# Patient Record
Sex: Female | Born: 1970 | Race: Black or African American | Hispanic: No | Marital: Married | Smoking: Never smoker
Health system: Southern US, Community
[De-identification: ages and names within clinical notes are randomized; demographics above are authoritative.]

## PROBLEM LIST (undated history)

## (undated) DIAGNOSIS — R079 Chest pain, unspecified: Secondary | ICD-10-CM

## (undated) DIAGNOSIS — I5032 Chronic diastolic (congestive) heart failure: Secondary | ICD-10-CM

## (undated) DIAGNOSIS — M549 Dorsalgia, unspecified: Secondary | ICD-10-CM

## (undated) DIAGNOSIS — I3139 Other pericardial effusion (noninflammatory): Secondary | ICD-10-CM

## (undated) DIAGNOSIS — R51 Headache: Secondary | ICD-10-CM

## (undated) DIAGNOSIS — R519 Headache, unspecified: Secondary | ICD-10-CM

## (undated) DIAGNOSIS — I319 Disease of pericardium, unspecified: Secondary | ICD-10-CM

## (undated) DIAGNOSIS — E785 Hyperlipidemia, unspecified: Secondary | ICD-10-CM

## (undated) DIAGNOSIS — S83209A Unspecified tear of unspecified meniscus, current injury, unspecified knee, initial encounter: Secondary | ICD-10-CM

## (undated) DIAGNOSIS — I10 Essential (primary) hypertension: Secondary | ICD-10-CM

## (undated) DIAGNOSIS — M1990 Unspecified osteoarthritis, unspecified site: Secondary | ICD-10-CM

## (undated) DIAGNOSIS — F329 Major depressive disorder, single episode, unspecified: Secondary | ICD-10-CM

## (undated) DIAGNOSIS — I313 Pericardial effusion (noninflammatory): Secondary | ICD-10-CM

## (undated) DIAGNOSIS — K219 Gastro-esophageal reflux disease without esophagitis: Secondary | ICD-10-CM

## (undated) DIAGNOSIS — R600 Localized edema: Secondary | ICD-10-CM

## (undated) DIAGNOSIS — F32A Depression, unspecified: Secondary | ICD-10-CM

## (undated) HISTORY — DX: Unspecified osteoarthritis, unspecified site: M19.90

## (undated) HISTORY — DX: Unspecified tear of unspecified meniscus, current injury, unspecified knee, initial encounter: S83.209A

## (undated) HISTORY — DX: Chronic diastolic (congestive) heart failure: I50.32

## (undated) HISTORY — DX: Chest pain, unspecified: R07.9

## (undated) HISTORY — DX: Disease of pericardium, unspecified: I31.9

## (undated) HISTORY — DX: Dorsalgia, unspecified: M54.9

## (undated) HISTORY — PX: ABDOMINAL HYSTERECTOMY: SHX81

## (undated) HISTORY — PX: PARTIAL HYSTERECTOMY: SHX80

## (undated) HISTORY — DX: Localized edema: R60.0

## (undated) HISTORY — DX: Depression, unspecified: F32.A

## (undated) HISTORY — PX: APPENDECTOMY: SHX54

## (undated) HISTORY — PX: TUBAL LIGATION: SHX77

---
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
## 1990-03-24 HISTORY — PX: TUBAL LIGATION: SHX77

## 2014-06-16 DIAGNOSIS — M545 Low back pain, unspecified: Secondary | ICD-10-CM | POA: Insufficient documentation

## 2014-06-23 ENCOUNTER — Emergency Department (HOSPITAL_COMMUNITY)
Admission: EM | Admit: 2014-06-23 | Discharge: 2014-06-23 | Disposition: A | Discharge status: Home / Self Care | Payer: 59 | Attending: Emergency Medicine | Admitting: Emergency Medicine

## 2014-06-23 ENCOUNTER — Emergency Department (HOSPITAL_COMMUNITY): Payer: 59

## 2014-06-23 ENCOUNTER — Encounter (HOSPITAL_COMMUNITY): Payer: Self-pay | Admitting: Emergency Medicine

## 2014-06-23 DIAGNOSIS — Z79899 Other long term (current) drug therapy: Secondary | ICD-10-CM | POA: Diagnosis not present

## 2014-06-23 DIAGNOSIS — Z8639 Personal history of other endocrine, nutritional and metabolic disease: Secondary | ICD-10-CM | POA: Insufficient documentation

## 2014-06-23 DIAGNOSIS — M545 Low back pain: Secondary | ICD-10-CM | POA: Diagnosis present

## 2014-06-23 DIAGNOSIS — M5416 Radiculopathy, lumbar region: Secondary | ICD-10-CM | POA: Insufficient documentation

## 2014-06-23 DIAGNOSIS — M5417 Radiculopathy, lumbosacral region: Secondary | ICD-10-CM

## 2014-06-23 HISTORY — DX: Hyperlipidemia, unspecified: E78.5

## 2014-06-23 MED ORDER — HYDROCODONE-ACETAMINOPHEN 5-325 MG PO TABS: 1.0000 | ORAL_TABLET | Freq: Four times a day (QID) | ORAL | Status: DC | PRN

## 2014-06-23 MED ORDER — METHYLPREDNISOLONE 4 MG PO KIT: 16.0000 mg | PACK | ORAL | Status: DC

## 2014-06-23 MED ORDER — HYDROCODONE-ACETAMINOPHEN 5-325 MG PO TABS
1.0000 | ORAL_TABLET | Freq: Once | ORAL | Status: AC
  Administered 2014-06-23: 1 via ORAL
  Filled 2014-06-23: qty 1

## 2014-06-23 NOTE — ED Notes (Signed)
Pt verbalizes understanding of not being able to drive after taking pain med as ordered.  Pt states her boyfriend is coming to pick her up.

## 2014-06-23 NOTE — ED Notes (Signed)
Pt c/o lower back pain radiating into R leg x 1 month. Pt states she has been seen by a doctor and given pain medication but no relief with medication.

## 2014-06-23 NOTE — ED Provider Notes (Signed)
CSN: [id number]     Arrival date & time 06/23/14  2059 History  This chart was scribed for non-physician practitioner Earley Favor, PA-C working with Tilden Fossa, MD by Annye Asa, ED Scribe. This patient was seen in room WTR8/WTR8 and the patient's care was started at 9:15 PM.    Chief Complaint  Patient presents with  . Back Pain   Patient is a 44 y.o. female presenting with back pain. The history is provided by the patient. No language interpreter was used.  Back Pain    HPI Comments: Alicia Wood is a 44 y.o. female with past medical history of HLD who presents to the Emergency Department complaining of 1 month of lower back pain, radiating into her right leg. She denies recent trauma or injury; "my pain just started while I was at work." She has been "in and out of the hospital in [place]" and given pain medication (ibuprofen, Flexeril then Robaxin, Gabapentin). She reports that this medication has not provided relief; she is looking into scheduling an appointment with physical therapy.  PCP is Dr. Katrinka Blazing.   Past Medical History  Diagnosis Date  . Hyperlipidemia    Past Surgical History  Procedure Laterality Date  . Tubal ligation    . Appendectomy    . Partial hysterectomy     No family history on file. History  Substance Use Topics  . Smoking status: Never Smoker   . Smokeless tobacco: Not on file  . Alcohol Use: Yes   OB History    No data available     Review of Systems  Musculoskeletal: Positive for back pain.      Allergies  Review of patient's allergies indicates no known allergies.  Home Medications   Prior to Admission medications   Medication Sig Start Date End Date Taking? Authorizing Provider  gabapentin (NEURONTIN) 300 MG capsule Take 300 mg by mouth 3 (three) times daily.   Yes Historical Provider, MD  lovastatin (MEVACOR) 20 MG tablet Take 20 mg by mouth at bedtime.   Yes Historical Provider, MD  methocarbamol (ROBAXIN) 750 MG tablet  Take 750 mg by mouth 4 (four) times daily.   Yes Historical Provider, MD  phentermine 30 MG capsule Take 30 mg by mouth daily.   Yes Historical Provider, MD  HYDROcodone-acetaminophen (NORCO/VICODIN) 5-325 MG per tablet Take 1 tablet by mouth every 6 (six) hours as needed for moderate pain. 06/23/14   Earley Favor, NP  methylPREDNISolone (MEDROL DOSEPAK) 4 MG tablet Take 4 tablets (16 mg total) by mouth 1 day or 1 dose. Start with  decreasing to 4 mg over a period of 8 days 16 mg for 2 days, 12 mg for 2 days 8 mg for 2 days, 4 mg for 2 days, then stop 06/23/14   Earley Favor, NP   BP 114/74 mmHg  Pulse 100  Temp(Src) 99.2 F (37.3 C) (Oral)  Resp 19  Ht  (1.727 m)  Wt 269 lb (122.018 kg)  BMI 40.91 kg/m2  SpO2 98% Physical Exam  Constitutional: She is oriented to person, place, and time. She appears well-developed and well-nourished.  HENT:  Head: Normocephalic and atraumatic.  Neck: No tracheal deviation present.  Cardiovascular: Normal rate, regular rhythm and normal heart sounds.   No murmur heard. Pulmonary/Chest: Effort normal and breath sounds normal. No respiratory distress. She has no wheezes. She has no rales.  Neurological: She is alert and oriented to person, place, and time.  Skin: Skin is warm and dry.  Psychiatric: She has a normal mood and affect. Her behavior is normal.  Nursing note and vitals reviewed.   ED Course  Procedures   DIAGNOSTIC STUDIES: Oxygen Saturation is 98% on RA, normal by my interpretation.    COORDINATION OF CARE: 9:19 PM Discussed treatment plan with pt at bedside and pt agreed to plan.   Labs Review Labs Reviewed - No data to display  Imaging Review Dg Lumbar Spine Complete  06/23/2014   CLINICAL DATA:  Chronic lower back pain shooting into the right leg for 1 month. Initial encounter.  EXAM: LUMBAR SPINE - COMPLETE 4+ VIEW  COMPARISON:  CT of the abdomen and pelvis performed 06/19/2008  FINDINGS: There is no evidence of fracture or  subluxation. Vertebral bodies demonstrate normal height and alignment. Intervertebral disc spaces are preserved. The visualized neural foramina are grossly unremarkable in appearance.  The visualized bowel gas pattern is unremarkable in appearance; air and stool are noted within the colon. The sacroiliac joints are within normal limits.  IMPRESSION: No evidence of fracture or subluxation along the lumbar spine. If the patient's symptoms persist, MRI of the lumbar spine would be appropriate for further evaluation.   Electronically Signed   By: Roanna RaiderJeffery  Chang M.D.   On: 06/23/2014 22:08     EKG Interpretation None      MDM   Final diagnoses:  Radicular pain of lumbosacral region    I personally performed the services described in this documentation, which was scribed in my presence. The recorded information has been reviewed and is accurate.     Earley FavorGail Kavir Savoca, NP 06/23/14 2218  Tilden FossaElizabeth Rees, MD 06/23/14 2350

## 2014-06-23 NOTE — ED Notes (Signed)
Pt reports low back pain.  States she was given gabapentin and robaxin by her PCP without relief.  Pt reports pain would radiate down to her legs at times.

## 2014-06-23 NOTE — Discharge Instructions (Signed)
Your x-ray is normal.  You have been given medication for pain control as well, as I steroid to decrease her symptoms.  Please continue to pursue physical therapy and follow-up with your primary care physician

## 2015-04-24 ENCOUNTER — Encounter (HOSPITAL_COMMUNITY): Payer: Self-pay

## 2015-04-24 ENCOUNTER — Emergency Department (HOSPITAL_COMMUNITY)
Admission: EM | Admit: 2015-04-24 | Discharge: 2015-04-25 | Disposition: A | Discharge status: Home / Self Care | Payer: 59 | Attending: Emergency Medicine | Admitting: Emergency Medicine

## 2015-04-24 DIAGNOSIS — M79661 Pain in right lower leg: Secondary | ICD-10-CM | POA: Diagnosis not present

## 2015-04-24 DIAGNOSIS — Z79899 Other long term (current) drug therapy: Secondary | ICD-10-CM | POA: Diagnosis not present

## 2015-04-24 DIAGNOSIS — Z8639 Personal history of other endocrine, nutritional and metabolic disease: Secondary | ICD-10-CM | POA: Insufficient documentation

## 2015-04-24 NOTE — ED Notes (Addendum)
Pt complains of right lower leg pain since last Wednesday, she states that sometimes it gives out and it feels tight ans warm

## 2015-04-25 ENCOUNTER — Encounter (HOSPITAL_COMMUNITY): Payer: Self-pay

## 2015-04-25 ENCOUNTER — Emergency Department (HOSPITAL_COMMUNITY)
Admission: EM | Admit: 2015-04-25 | Discharge: 2015-04-25 | Disposition: A | Discharge status: Other Acute Inpatient Hospital | Payer: 59

## 2015-04-25 ENCOUNTER — Emergency Department (HOSPITAL_COMMUNITY)
Admission: EM | Admit: 2015-04-25 | Discharge: 2015-04-26 | Disposition: A | Discharge status: Home / Self Care | Payer: 59 | Source: Home / Self Care | Attending: Emergency Medicine | Admitting: Emergency Medicine

## 2015-04-25 ENCOUNTER — Ambulatory Visit (HOSPITAL_BASED_OUTPATIENT_CLINIC_OR_DEPARTMENT_OTHER)
Admission: RE | Admit: 2015-04-25 | Discharge: 2015-04-25 | Disposition: A | Discharge status: Home / Self Care | Payer: 59 | Source: Ambulatory Visit | Attending: Emergency Medicine | Admitting: Emergency Medicine

## 2015-04-25 DIAGNOSIS — Z79899 Other long term (current) drug therapy: Secondary | ICD-10-CM | POA: Diagnosis not present

## 2015-04-25 DIAGNOSIS — M79609 Pain in unspecified limb: Secondary | ICD-10-CM | POA: Diagnosis not present

## 2015-04-25 DIAGNOSIS — M79604 Pain in right leg: Secondary | ICD-10-CM

## 2015-04-25 DIAGNOSIS — Z8639 Personal history of other endocrine, nutritional and metabolic disease: Secondary | ICD-10-CM | POA: Diagnosis not present

## 2015-04-25 DIAGNOSIS — M79661 Pain in right lower leg: Secondary | ICD-10-CM | POA: Diagnosis not present

## 2015-04-25 MED ORDER — IBUPROFEN 800 MG PO TABS
800.0000 mg | ORAL_TABLET | Freq: Once | ORAL | Status: AC
  Administered 2015-04-25: 800 mg via ORAL
  Filled 2015-04-25: qty 1

## 2015-04-25 MED ORDER — IBUPROFEN 800 MG PO TABS: 800.0000 mg | ORAL_TABLET | Freq: Three times a day (TID) | ORAL | Status: DC

## 2015-04-25 MED ORDER — ENOXAPARIN SODIUM 120 MG/0.8ML ~~LOC~~ SOLN
1.0000 mg/kg | Freq: Once | SUBCUTANEOUS | Status: AC
  Administered 2015-04-25: 120 mg via SUBCUTANEOUS
  Filled 2015-04-25: qty 0.8

## 2015-04-25 MED ORDER — HYDROCODONE-ACETAMINOPHEN 5-325 MG PO TABS: 1.0000 | ORAL_TABLET | ORAL | Status: DC | PRN

## 2015-04-25 NOTE — ED Notes (Signed)
Pt transported to vascular lab. 

## 2015-04-25 NOTE — ED Provider Notes (Signed)
CSN: [id number]     Arrival date & time 04/24/15  2307 History   First MD Initiated Contact with Patient 04/25/15 0007     Chief Complaint  Patient presents with  . Leg Pain     (Consider location/radiation/quality/duration/timing/severity/associated sxs/prior Treatment) Patient is a 45 y.o. female presenting with leg pain. The history is provided by the patient. No language interpreter was used.  Leg Pain Location:  Leg Time since incident:  5 days Injury: no   Leg location:  R lower leg Pain details:    Severity:  Moderate Chronicity:  New Dislocation: no   Prior injury to area:  No Relieved by:  Rest Worsened by:  Activity and bearing weight Ineffective treatments:  None tried Associated symptoms: no fever   Associated symptoms comment:  Lower right leg pain for the past several days without known injury. She reports when active and ambulatory the pain becomes so intense "it'll give out". She reports falls. No back or thigh pain. No fever, redness, numbness. No history of blood clots.    Past Medical History  Diagnosis Date  . Hyperlipidemia    Past Surgical History  Procedure Laterality Date  . Tubal ligation    . Appendectomy    . Partial hysterectomy     History reviewed. No pertinent family history. Social History  Substance Use Topics  . Smoking status: Never Smoker   . Smokeless tobacco: None  . Alcohol Use: Yes   OB History    No data available     Review of Systems  Constitutional: Negative for fever and chills.  Musculoskeletal:       See HPI.  Skin: Negative.  Negative for color change.  Neurological: Negative.  Negative for numbness.      Allergies  Review of patient's allergies indicates no known allergies.  Home Medications   Prior to Admission medications   Medication Sig Start Date End Date Taking? Authorizing Provider  terbinafine (LAMISIL) 250 MG tablet Take 250 mg by mouth daily. 03/28/15  Yes Historical Provider, MD   BP 107/81  mmHg  Pulse 74  Temp(Src) 98.3 F (36.8 C) (Oral)  Resp 18  SpO2 100% Physical Exam  Constitutional: She is oriented to person, place, and time. She appears well-developed and well-nourished.  Neck: Normal range of motion.  Cardiovascular: Intact distal pulses.   Pulmonary/Chest: Effort normal.  Musculoskeletal:  Right lower leg tender to calf and posterolateral lower leg. No swelling. No palpable mass. No redness or warmth.   Neurological: She is alert and oriented to person, place, and time.  Skin: Skin is warm and dry.    ED Course  Procedures (including critical care time) Labs Review Labs Reviewed - No data to display  Imaging Review No results found. I have personally reviewed and evaluated these images and lab results as part of my medical decision-making.   EKG Interpretation None      MDM   Final diagnoses:  None    1. Right lower leg pain  Patient presents with calf pain, no evidence of infection or injury. Differential includes DVT vs muscular pain, favor muscular pain. Will give Lovenox and have the patient return in the morning for doppler of lower leg to r/o DVT. Will provide pain relief and encourage PCP follow up for further management in outpatient setting.     Elpidio Anis, PA-C 04/25/15 0028  Azalia Bilis, MD 04/25/15 [phone]

## 2015-04-25 NOTE — ED Notes (Signed)
Pt here for right leg pain. Pt states leg will just give out causing her to fall. Seen last night at [facility] and told she could possibly have a blood clot and told to come here. Pt ambulatory to room.

## 2015-04-25 NOTE — Progress Notes (Signed)
*  Preliminary Results* Right lower extremity venous duplex completed. Right lower extremity is negative for deep vein thrombosis. There is no evidence of right Baker's cyst.  04/25/2015 9:57 AM  Gertie Fey, RVT, RDCS, RDMS

## 2015-04-25 NOTE — ED Notes (Signed)
Pt was accidentally arrived.  Pt has a Vascular study at [facility] this morning and was misinformed that it was at [facility].

## 2015-04-25 NOTE — Discharge Instructions (Signed)
Heat Therapy  Heat therapy can help ease sore, stiff, injured, and tight muscles and joints. Heat relaxes your muscles, which may help ease your pain.   RISKS AND COMPLICATIONS  If you have any of the following conditions, do not use heat therapy unless your health care provider has approved:   Poor circulation.   Healing wounds or scarred skin in the area being treated.   Diabetes, heart disease, or high blood pressure.   Not being able to feel (numbness) the area being treated.   Unusual swelling of the area being treated.   Active infections.   Blood clots.   Cancer.   Inability to communicate pain. This may include young children and people who have problems with their brain function (dementia).   Pregnancy.  Heat therapy should only be used on old, pre-existing, or long-lasting (chronic) injuries. Do not use heat therapy on new injuries unless directed by your health care provider.  HOW TO USE HEAT THERAPY  There are several different kinds of heat therapy, including:   Moist heat pack.   Warm water bath.   Hot water bottle.   Electric heating pad.   Heated gel pack.   Heated wrap.   Electric heating pad.  Use the heat therapy method suggested by your health care provider. Follow your health care provider's instructions on when and how to use heat therapy.  GENERAL HEAT THERAPY RECOMMENDATIONS   Do not sleep while using heat therapy. Only use heat therapy while you are awake.   Your skin may turn pink while using heat therapy. Do not use heat therapy if your skin turns red.   Do not use heat therapy if you have new pain.   High heat or long exposure to heat can cause burns. Be careful when using heat therapy to avoid burning your skin.   Do not use heat therapy on areas of your skin that are already irritated, such as with a rash or sunburn.  SEEK MEDICAL CARE IF:   You have blisters, redness, swelling, or numbness.   You have new pain.   Your pain is worse.  MAKE SURE  YOU:   Understand these instructions.   Will watch your condition.   Will get help right away if you are not doing well or get worse.     This information is not intended to replace advice given to you by your health care provider. Make sure you discuss any questions you have with your health care provider.     Document Released: 06/02/2011 Document Revised: 03/31/2014 Document Reviewed: 05/03/2013  Elsevier Interactive Patient Education 2016 [employer]

## 2015-04-25 NOTE — ED Provider Notes (Signed)
Medical screening.  The patient was checked into the emergency department when she supposed to be at the vascular lab to have her Doppler studies of her right leg. Patient was seen last evening at the [facility] [facility]. Was set up for vascular lab studies this morning. She accidentally went back to [facility] told to come here patient is not supposed to be in the emergency department at this time.  Patient's vital signs without any acute abnormalities. The patient in no distress. No tachycardia no hypoxia. Lungs are clear bilaterally heart regular rate and rhythm. Abdomen soft nontender. Right leg without significant swelling. Patient stable to go to vascular lab.  Vanetta Mulders, MD 04/25/15 [phone]

## 2015-07-20 ENCOUNTER — Ambulatory Visit: Payer: 59 | Admitting: Dietician

## 2015-08-20 ENCOUNTER — Emergency Department (HOSPITAL_COMMUNITY): Payer: 59

## 2015-08-20 ENCOUNTER — Encounter (HOSPITAL_COMMUNITY): Payer: Self-pay | Admitting: *Deleted

## 2015-08-20 ENCOUNTER — Emergency Department (HOSPITAL_COMMUNITY)
Admission: EM | Admit: 2015-08-20 | Discharge: 2015-08-21 | Disposition: A | Discharge status: Home / Self Care | Payer: 59 | Attending: Emergency Medicine | Admitting: Emergency Medicine

## 2015-08-20 DIAGNOSIS — E785 Hyperlipidemia, unspecified: Secondary | ICD-10-CM | POA: Insufficient documentation

## 2015-08-20 DIAGNOSIS — R079 Chest pain, unspecified: Secondary | ICD-10-CM | POA: Insufficient documentation

## 2015-08-20 DIAGNOSIS — R0602 Shortness of breath: Secondary | ICD-10-CM | POA: Diagnosis not present

## 2015-08-20 DIAGNOSIS — R0789 Other chest pain: Secondary | ICD-10-CM | POA: Diagnosis not present

## 2015-08-20 LAB — CBC
HCT: 33.9 % — ABNORMAL LOW (ref 36.0–46.0)
Hemoglobin: 11.6 g/dL — ABNORMAL LOW (ref 12.0–15.0)
MCH: 29.1 pg (ref 26.0–34.0)
MCHC: 34.2 g/dL (ref 30.0–36.0)
MCV: 85.2 fL (ref 78.0–100.0)
Platelets: 341 10*3/uL (ref 150–400)
RBC: 3.98 MIL/uL (ref 3.87–5.11)
RDW: 13.7 % (ref 11.5–15.5)
WBC: 6.4 10*3/uL (ref 4.0–10.5)

## 2015-08-20 LAB — BASIC METABOLIC PANEL
Anion gap: 8 (ref 5–15)
BUN: 14 mg/dL (ref 6–20)
CO2: 26 mmol/L (ref 22–32)
Calcium: 8.8 mg/dL — ABNORMAL LOW (ref 8.9–10.3)
Chloride: 101 mmol/L (ref 101–111)
Creatinine, Ser: 1.11 mg/dL — ABNORMAL HIGH (ref 0.44–1.00)
GFR calc Af Amer: >60 mL/min (ref >60)
GFR calc non Af Amer: 58 mL/min — ABNORMAL LOW (ref >60)
Glucose, Bld: 106 mg/dL — ABNORMAL HIGH (ref 65–99)
Potassium: 3.7 mmol/L (ref 3.5–5.1)
Sodium: 135 mmol/L (ref 135–145)

## 2015-08-20 LAB — URINALYSIS, ROUTINE W REFLEX MICROSCOPIC
Glucose, UA: NEGATIVE mg/dL
Hgb urine dipstick: NEGATIVE
Ketones, ur: NEGATIVE mg/dL
Leukocytes, UA: NEGATIVE
Nitrite: NEGATIVE
Protein, ur: NEGATIVE mg/dL
Specific Gravity, Urine: 1.031 — ABNORMAL HIGH (ref 1.005–1.030)
pH: 5.5 (ref 5.0–8.0)

## 2015-08-20 LAB — I-STAT TROPONIN, ED: Troponin i, poc: 0 ng/mL (ref 0.00–0.08)

## 2015-08-20 MED ORDER — KETOROLAC TROMETHAMINE 30 MG/ML IJ SOLN
30.0000 mg | Freq: Once | INTRAMUSCULAR | Status: AC
  Administered 2015-08-20: 30 mg via INTRAVENOUS
  Filled 2015-08-20: qty 1

## 2015-08-20 NOTE — ED Provider Notes (Signed)
CSN: [id number]     Arrival date & time 08/20/15  2032 History   First MD Initiated Contact with Patient 08/20/15 2054     Chief Complaint  Patient presents with  . Chest Pain     (Consider location/radiation/quality/duration/timing/severity/associated sxs/prior Treatment) HPI Comments: 45yo F w/ HLD who p/w chest pain. Pt states that for the past 1 week, she has had constant, severe, left-sided chest pain that is worse laying flat and is sometimes associated with right shoulder pain and shortness of breath, particularly when laying flat. The pain is not associated with exertion. She denies any recent illness, fevers, cough/cold symptoms, vomiting, diarrhea, or abd pain. She does note that she urinates a lot, no dysuria. No recent travel, leg swelling, OCP use, hx cancer, or h/o blood clots. No FH heart disease or blood clots.   Patient is a 45 y.o. female presenting with chest pain. The history is provided by the patient.  Chest Pain   Past Medical History  Diagnosis Date  . Hyperlipidemia    Past Surgical History  Procedure Laterality Date  . Tubal ligation    . Appendectomy    . Partial hysterectomy     No family history on file. Social History  Substance Use Topics  . Smoking status: Never Smoker   . Smokeless tobacco: None  . Alcohol Use: Yes   OB History    No data available     Review of Systems  Cardiovascular: Positive for chest pain.   10 Systems reviewed and are negative for acute change except as noted in the HPI.    Allergies  Review of patient's allergies indicates no known allergies.  Home Medications   Prior to Admission medications   Medication Sig Start Date End Date Taking? Authorizing Provider  ibuprofen (ADVIL,MOTRIN) 800 MG tablet Take 1 tablet (800 mg total) by mouth 3 (three) times daily. 04/25/15  Yes Elpidio AnisShari Upstill, PA-C  HYDROcodone-acetaminophen (NORCO/VICODIN) 5-325 MG tablet Take 1 tablet by mouth every 4 (four) hours as needed. 04/25/15    Shari Upstill, PA-C   BP 124/91 mmHg  Pulse 72  Temp(Src) 98.7 F (37.1 C) (Oral)  Resp 11  Ht 5\' 8"  (1.727 m)  Wt 264 lb (119.75 kg)  BMI 40.15 kg/m2  SpO2 100% Physical Exam  Constitutional: She is oriented to person, place, and time. She appears well-developed and well-nourished. No distress.  Sitting forward  HENT:  Head: Normocephalic and atraumatic.  Moist mucous membranes  Eyes: Conjunctivae are normal. Pupils are equal, round, and reactive to light.  Neck: Neck supple.  Cardiovascular: Normal rate, regular rhythm and normal heart sounds.   No murmur heard. Pulmonary/Chest: Effort normal and breath sounds normal.  Abdominal: Soft. Bowel sounds are normal. She exhibits no distension. There is no tenderness.  Musculoskeletal: She exhibits no edema.  Neurological: She is alert and oriented to person, place, and time.  Fluent speech  Skin: Skin is warm and dry.  Psychiatric: She has a normal mood and affect. Judgment normal.  Nursing note and vitals reviewed.   ED Course  Procedures (including critical care time) Labs Review Labs Reviewed  BASIC METABOLIC PANEL - Abnormal; Notable for the following:    Glucose, Bld 106 (*)    Creatinine, Ser 1.11 (*)    Calcium 8.8 (*)    GFR calc non Af Amer 58 (*)    All other components within normal limits  CBC - Abnormal; Notable for the following:    Hemoglobin 11.6 (*)  HCT 33.9 (*)    All other components within normal limits  URINALYSIS, ROUTINE W REFLEX MICROSCOPIC (NOT AT [facility]) - Abnormal; Notable for the following:    Color, Urine AMBER (*)    APPearance CLOUDY (*)    Specific Gravity, Urine 1.031 (*)    Bilirubin Urine SMALL (*)    All other components within normal limits  I-STAT TROPOININ, ED    Imaging Review Dg Chest 2 View  08/20/2015  CLINICAL DATA:  Central chest pain for 1 week, worsened today. Shortness of breath and right shoulder pain. EXAM: CHEST  2 VIEW COMPARISON:  None. FINDINGS: The  cardiomediastinal contours are normal. The lungs are clear. Pulmonary vasculature is normal. No consolidation, pleural effusion, or pneumothorax. No acute osseous abnormalities are seen. IMPRESSION: No acute pulmonary process. Electronically Signed   By: Rubye Oaks M.D.   On: 08/20/2015 21:03   I have personally reviewed and evaluated these lab results as part of my medical decision-making.   EKG Interpretation   Date/Time:  Monday Aug 20 2015 20:33:55 EDT Ventricular Rate:  91 PR Interval:  144 QRS Duration: 71 QT Interval:  354 QTC Calculation: 435 R Axis:   -4 Text Interpretation:  Sinus rhythm Borderline T abnormalities, anterior  leads Confirmed by Lincoln Brigham [phone]) on 08/20/2015 8:41:02 PM     Medications  ketorolac (TORADOL) 30 MG/ML injection 30 mg (30 mg Intravenous Given 08/20/15 2331)    MDM   Final diagnoses:  Chest pain, unspecified chest pain type   Pt w/ 1 week of constant CP worse laying flat and associated w/ shortness of breath especially when laying flat. She was well-appearing on exam with normal vital signs. O2 sat 98-100% on room air and normal work of breathing. EKG without obvious ischemic changes, no previous available for comparison. Chest x-ray unremarkable. Labwork reassuring.The patient is PERC negative therefore PE very unlikely. Given the constant nature of her CP for 1 week with normal troponin, I doubt ACS. Differential includes pericarditis given positional and constant nature of pain. HEART score <3 therefore I feel she is safe for discharge home. I have discussed supportive care including scheduled NSAIDs up to 5 days and f/u with PCP if sx do not improve. Return precautions reviewed and patient voiced understanding. Patient discharged in satisfactory condition.   Laurence Spates, MD 08/21/15 [phone]

## 2015-08-20 NOTE — ED Notes (Signed)
Pt states that she has had chest pain for about 1 week, worse over the past 3 days, associated with pain in the right shoulder area and sob. Pain not improved with OTC meds.

## 2015-08-20 NOTE — Discharge Instructions (Signed)
PLEASE TAKE A NON-STEROIDAL (ALEVE OR IBUPROFEN) ON A SCHEDULE FOR 3-4 DAYS STRAIGHT, NO LONGER THAN 5 DAYS CONSECUTIVELY. YOU MAY TAKE TYLENOL AS NEEDED IN BETWEEN DOSES.

## 2015-08-20 NOTE — ED Notes (Signed)
Patient transported to X-ray 

## 2015-11-11 ENCOUNTER — Encounter (HOSPITAL_COMMUNITY): Payer: Self-pay | Admitting: *Deleted

## 2015-11-11 ENCOUNTER — Ambulatory Visit (HOSPITAL_COMMUNITY)
Admission: EM | Admit: 2015-11-11 | Discharge: 2015-11-11 | Disposition: A | Discharge status: Home / Self Care | Payer: 59 | Attending: Family Medicine | Admitting: Family Medicine

## 2015-11-11 ENCOUNTER — Ambulatory Visit (INDEPENDENT_AMBULATORY_CARE_PROVIDER_SITE_OTHER): Payer: 59

## 2015-11-11 DIAGNOSIS — R0789 Other chest pain: Secondary | ICD-10-CM | POA: Diagnosis not present

## 2015-11-11 DIAGNOSIS — R079 Chest pain, unspecified: Secondary | ICD-10-CM | POA: Diagnosis not present

## 2015-11-11 LAB — POCT URINALYSIS DIP (DEVICE)
Bilirubin Urine: NEGATIVE
Glucose, UA: NEGATIVE mg/dL
Hgb urine dipstick: NEGATIVE
Ketones, ur: NEGATIVE mg/dL
Leukocytes, UA: NEGATIVE
Nitrite: NEGATIVE
Protein, ur: NEGATIVE mg/dL
Specific Gravity, Urine: 1.025 (ref 1.005–1.030)
Urobilinogen, UA: 2 mg/dL — ABNORMAL HIGH (ref 0.0–1.0)
pH: 6 (ref 5.0–8.0)

## 2015-11-11 LAB — POCT PREGNANCY, URINE: Preg Test, Ur: NEGATIVE

## 2015-11-11 MED ORDER — PREDNISONE 20 MG PO TABS: ORAL_TABLET | ORAL | 0 refills | Status: DC

## 2015-11-11 MED ORDER — HYDROCODONE-ACETAMINOPHEN 5-325 MG PO TABS: 1.0000 | ORAL_TABLET | Freq: Four times a day (QID) | ORAL | 0 refills | Status: DC | PRN

## 2015-11-11 NOTE — ED Triage Notes (Signed)
Pt c/o constant mid-upper chest pain x 2 wks with painful breathing, talking, bending.  C/O SOB - SOB worse when laying down trying to sleep.  Occasionally pain radiates over right shoulder.  C/O productive cough for past 3 days.  Pain feels like it did in May when she went to ED.

## 2015-11-11 NOTE — ED Provider Notes (Signed)
[facility]    CSN: [id number] Arrival date & time: 11/11/15  1622  First Provider Contact:  First MD Initiated Contact with Patient 11/11/15 1819        History   Chief Complaint Chief Complaint  Patient presents with  . Chest Pain    HPI Alicia Wood is a 45 y.o. female.   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 Her job entails lifting and being physically active.  She presents with 3 weeks of continuous right upper chest pain in the anterior area. This is aggravated by bending over, lifting, or coughing. She says that in general she does not have a cough.  Patient has a long history of menopausal symptoms with night sweats. There is no new fever or night sweats in with this illness. She has not had shortness of breath or nausea or abdominal pain  Patient is trying Aleve and ibuprofen without success. She's also tried Biofreeze.      Past Medical History:  Diagnosis Date  . Hyperlipidemia     There are no active problems to display for this patient.   Past Surgical History:  Procedure Laterality Date  . ABDOMINAL HYSTERECTOMY    . APPENDECTOMY    . PARTIAL HYSTERECTOMY    . TUBAL LIGATION      OB History    No data available       Home Medications    Prior to Admission medications   Medication Sig Start Date End Date Taking? Authorizing Provider  HYDROcodone-acetaminophen (NORCO) 5-325 MG tablet Take 1 tablet by mouth every 6 (six) hours as needed for moderate pain. 11/11/15   Elvina SidleKurt Tanza Pellot, MD  predniSONE (DELTASONE) 20 MG tablet Two daily with food 11/11/15   Elvina SidleKurt Aaryanna Hyden, MD    Family History No family history on file.  Social History Social History  Substance Use Topics  . Smoking status: Never Smoker  . Smokeless tobacco: Not on file  . Alcohol use Yes     Comment: occasional     Allergies   Review of patient's allergies indicates no known allergies.   Review of  Systems Review of Systems  Constitutional: Negative.   HENT: Negative.   Eyes: Negative.   Respiratory: Positive for chest tightness. Negative for cough, choking, shortness of breath, wheezing and stridor.   Cardiovascular: Positive for chest pain. Negative for palpitations and leg swelling.  Gastrointestinal: Negative.   Endocrine: Negative.   Genitourinary: Negative.   Musculoskeletal: Negative.   Neurological: Negative.      Physical Exam Triage Vital Signs ED Triage Vitals  Enc Vitals Group     BP 11/11/15 1658 121/87     Pulse Rate 11/11/15 1658 92     Resp 11/11/15 1658 20     Temp 11/11/15 1658 97.9 F (36.6 C)     Temp Source 11/11/15 1658 Oral     SpO2 11/11/15 1658 97 %     Weight --      Height --      Head Circumference --      Peak Flow --      Pain Score 11/11/15 1659 10     Pain Loc --      Pain Edu? --      Excl. in GC? --    No data found.   Updated Vital Signs BP 121/87   Pulse 92   Temp 97.9 F (36.6 C) (Oral)   Resp  20   SpO2 97%   Visual Acuity Right Eye Distance:   Left Eye Distance:   Bilateral Distance:    Right Eye Near:   Left Eye Near:    Bilateral Near:     Physical Exam  Constitutional: She is oriented to person, place, and time. She appears well-developed and well-nourished.  HENT:  Head: Normocephalic.  Mouth/Throat: Oropharynx is clear and moist.  Eyes: Conjunctivae are normal. Pupils are equal, round, and reactive to light.  Neck: Normal range of motion. Neck supple.  Cardiovascular: Normal rate, regular rhythm and normal heart sounds.  Exam reveals no gallop and no friction rub.   No murmur heard. Pulmonary/Chest: Effort normal and breath sounds normal.  Musculoskeletal: Normal range of motion.  Neurological: She is alert and oriented to person, place, and time.  Skin: Skin is warm and dry.  Nursing note and vitals reviewed.    UC Treatments / Results  Labs (all labs ordered are listed, but only abnormal  results are displayed) Labs Reviewed  POCT URINALYSIS DIP (DEVICE) - Abnormal; Notable for the following:       Result Value   Urobilinogen, UA 2.0 (*)    All other components within normal limits  POCT PREGNANCY, URINE    EKG  EKG Interpretation None       Radiology Dg Chest 2 View  Result Date: 11/11/2015 CLINICAL DATA:  Chest pain EXAM: CHEST  2 VIEW COMPARISON:  08/20/2015 FINDINGS: Mild cardiomegaly that is similar to previous, especially in the lateral projection. Stable aortic and hilar contours. There is no edema, consolidation, effusion, or pneumothorax. IMPRESSION: 1. No acute finding. 2. Mild cardiomegaly. Electronically Signed   By: Marnee SpringJonathon  Watts M.D.   On: 11/11/2015 18:36    Cardiomegaly, otherwise negative Procedures Procedures (including critical care time)  Medications Ordered in UC Medications - No data to display   Initial Impression / Assessment and Plan / UC Course  I have reviewed the triage vital signs and the nursing notes.  Pertinent labs & imaging results that were available during my care of the patient were reviewed by me and considered in my medical decision making (see chart for details).  Clinical Course      Final Clinical Impressions(s) / UC Diagnoses   Final diagnoses:  Chest wall pain    New Prescriptions New Prescriptions   HYDROCODONE-ACETAMINOPHEN (NORCO) 5-325 MG TABLET    Take 1 tablet by mouth every 6 (six) hours as needed for moderate pain.   PREDNISONE (DELTASONE) 20 MG TABLET    Two daily with food     Elvina SidleKurt Gaila Engebretsen, MD 11/11/15 [phone]

## 2015-11-22 ENCOUNTER — Other Ambulatory Visit: Payer: Self-pay | Admitting: Family Medicine

## 2015-11-22 ENCOUNTER — Encounter (HOSPITAL_COMMUNITY): Payer: Self-pay | Admitting: Emergency Medicine

## 2015-11-22 ENCOUNTER — Ambulatory Visit
Admission: RE | Admit: 2015-11-22 | Discharge: 2015-11-22 | Disposition: A | Discharge status: Home / Self Care | Payer: 59 | Source: Ambulatory Visit | Attending: Family Medicine | Admitting: Family Medicine

## 2015-11-22 ENCOUNTER — Inpatient Hospital Stay (HOSPITAL_COMMUNITY)
Admission: EM | Admit: 2015-11-22 | Discharge: 2015-11-29 | DRG: 316 | Disposition: A | Discharge status: Home / Self Care | Payer: 59 | Attending: Cardiovascular Disease | Admitting: Cardiovascular Disease

## 2015-11-22 DIAGNOSIS — R0781 Pleurodynia: Secondary | ICD-10-CM | POA: Diagnosis not present

## 2015-11-22 DIAGNOSIS — I3139 Other pericardial effusion (noninflammatory): Secondary | ICD-10-CM | POA: Diagnosis present

## 2015-11-22 DIAGNOSIS — D649 Anemia, unspecified: Secondary | ICD-10-CM | POA: Diagnosis present

## 2015-11-22 DIAGNOSIS — R000 Tachycardia, unspecified: Secondary | ICD-10-CM | POA: Diagnosis present

## 2015-11-22 DIAGNOSIS — I319 Disease of pericardium, unspecified: Secondary | ICD-10-CM | POA: Diagnosis not present

## 2015-11-22 DIAGNOSIS — I313 Pericardial effusion (noninflammatory): Secondary | ICD-10-CM | POA: Diagnosis not present

## 2015-11-22 DIAGNOSIS — E785 Hyperlipidemia, unspecified: Secondary | ICD-10-CM | POA: Diagnosis present

## 2015-11-22 DIAGNOSIS — R0789 Other chest pain: Secondary | ICD-10-CM

## 2015-11-22 DIAGNOSIS — R0602 Shortness of breath: Secondary | ICD-10-CM | POA: Diagnosis not present

## 2015-11-22 LAB — COMPREHENSIVE METABOLIC PANEL
ALT: 12 U/L — ABNORMAL LOW (ref 14–54)
AST: 18 U/L (ref 15–41)
Albumin: 3.9 g/dL (ref 3.5–5.0)
Alkaline Phosphatase: 80 U/L (ref 38–126)
Anion gap: 10 (ref 5–15)
BUN: 10 mg/dL (ref 6–20)
CO2: 24 mmol/L (ref 22–32)
Calcium: 9.6 mg/dL (ref 8.9–10.3)
Chloride: 101 mmol/L (ref 101–111)
Creatinine, Ser: 0.82 mg/dL (ref 0.44–1.00)
GFR calc Af Amer: >60 mL/min (ref >60)
GFR calc non Af Amer: >60 mL/min (ref >60)
Glucose, Bld: 89 mg/dL (ref 65–99)
Potassium: 4.4 mmol/L (ref 3.5–5.1)
Sodium: 135 mmol/L (ref 135–145)
Total Bilirubin: 1 mg/dL (ref 0.3–1.2)
Total Protein: 7.4 g/dL (ref 6.5–8.1)

## 2015-11-22 LAB — CBC WITH DIFFERENTIAL/PLATELET
Basophils Absolute: 0 10*3/uL (ref 0.0–0.1)
Basophils Relative: 0 %
Eosinophils Absolute: 0 10*3/uL (ref 0.0–0.7)
Eosinophils Relative: 1 %
HCT: 36.8 % (ref 36.0–46.0)
Hemoglobin: 11.8 g/dL — ABNORMAL LOW (ref 12.0–15.0)
Lymphocytes Relative: 18 %
Lymphs Abs: 1.6 10*3/uL (ref 0.7–4.0)
MCH: 28.6 pg (ref 26.0–34.0)
MCHC: 32.1 g/dL (ref 30.0–36.0)
MCV: 89.3 fL (ref 78.0–100.0)
Monocytes Absolute: 0.5 10*3/uL (ref 0.1–1.0)
Monocytes Relative: 6 %
Neutro Abs: 6.3 10*3/uL (ref 1.7–7.7)
Neutrophils Relative %: 75 %
Platelets: 387 10*3/uL (ref 150–400)
RBC: 4.12 MIL/uL (ref 3.87–5.11)
RDW: 14.5 % (ref 11.5–15.5)
WBC: 8.4 10*3/uL (ref 4.0–10.5)

## 2015-11-22 LAB — I-STAT TROPONIN, ED: Troponin i, poc: 0 ng/mL (ref 0.00–0.08)

## 2015-11-22 MED ORDER — ONDANSETRON HCL 4 MG/2ML IJ SOLN
4.0000 mg | Freq: Four times a day (QID) | INTRAMUSCULAR | Status: DC | PRN
  Administered 2015-11-23 – 2015-11-24 (×2): 4 mg via INTRAVENOUS
  Filled 2015-11-22 (×2): qty 2

## 2015-11-22 MED ORDER — KETOROLAC TROMETHAMINE 30 MG/ML IJ SOLN
30.0000 mg | Freq: Once | INTRAMUSCULAR | Status: AC
  Administered 2015-11-22: 30 mg via INTRAVENOUS
  Filled 2015-11-22: qty 1

## 2015-11-22 MED ORDER — IOPAMIDOL (ISOVUE-300) INJECTION 61%
75.0000 mL | Freq: Once | INTRAVENOUS | Status: AC | PRN
  Administered 2015-11-22: 75 mL via INTRAVENOUS

## 2015-11-22 MED ORDER — ACETAMINOPHEN 325 MG PO TABS
650.0000 mg | ORAL_TABLET | ORAL | Status: DC | PRN
  Administered 2015-11-27 (×2): 650 mg via ORAL
  Filled 2015-11-22 (×2): qty 2

## 2015-11-22 MED ORDER — COLCHICINE 0.6 MG PO TABS
0.6000 mg | ORAL_TABLET | Freq: Every day | ORAL | Status: DC
  Administered 2015-11-23: 0.6 mg via ORAL
  Filled 2015-11-22: qty 1

## 2015-11-22 MED ORDER — COLCHICINE 0.6 MG PO TABS
1.2000 mg | ORAL_TABLET | Freq: Once | ORAL | Status: AC
  Administered 2015-11-22: 1.2 mg via ORAL
  Filled 2015-11-22: qty 2

## 2015-11-22 NOTE — ED Provider Notes (Signed)
[facility]   CSN: [id number] Arrival date & time: 11/22/15  1828     History   Chief Complaint Chief Complaint  Patient presents with  . Chest Pain    HPI Shawndra Clute is a 45 y.o. female.  Patient presents with concern of ongoing chest pain, dyspnea. He notes anterior chest pressure, dyspnea, both worse with exertion. Symptoms of been present for at least the past few weeks, though they have become worse over the past few days. Patient has seen multiple physicians during this illness, has previously been diagnosed with musculoskeletal pain. Minimal relief with OTC medication, anti-inflammatories, Tylenol. No ongoing fever, chills, syncope or nausea, vomiting. Patient denies history of substantial medical problems, does not smoke.    Chest Pain   Pertinent negatives include no vomiting.    Past Medical History:  Diagnosis Date  . Hyperlipidemia     Patient Active Problem List   Diagnosis Date Noted  . Pericardial effusion 11/22/2015    Past Surgical History:  Procedure Laterality Date  . ABDOMINAL HYSTERECTOMY    . APPENDECTOMY    . PARTIAL HYSTERECTOMY    . TUBAL LIGATION      OB History    No data available       Home Medications    Prior to Admission medications   Medication Sig Start Date End Date Taking? Authorizing Provider  HYDROcodone-acetaminophen (NORCO) 5-325 MG tablet Take 1 tablet by mouth every 6 (six) hours as needed for moderate pain. Patient not taking: Reported on 11/22/2015 11/11/15   Elvina Sidle, MD  predniSONE (DELTASONE) 20 MG tablet Two daily with food Patient not taking: Reported on 11/22/2015 11/11/15   Elvina Sidle, MD    Family History No family history on file.  Social History Social History  Substance Use Topics  . Smoking status: Never Smoker  . Smokeless tobacco: Never Used  . Alcohol use Yes     Comment: occasional     Allergies   Review of patient's allergies indicates no  known allergies.   Review of Systems Review of Systems  Constitutional:       Per HPI, otherwise negative  HENT:       Per HPI, otherwise negative  Respiratory:       Per HPI, otherwise negative  Cardiovascular: Positive for chest pain.       Per HPI, otherwise negative  Gastrointestinal: Negative for vomiting.  Endocrine:       Negative aside from HPI  Genitourinary:       Neg aside from HPI   Musculoskeletal:       Per HPI, otherwise negative  Skin: Negative.   Neurological: Negative for syncope.     Physical Exam Updated Vital Signs BP 106/73   Pulse 82   Temp 98.6 F (37 C) (Oral)   Resp 20   Ht 5\' 8"  (1.727 m)   Wt 266 lb (120.7 kg)   SpO2 100%   BMI 40.45 kg/m   Physical Exam  Constitutional: She is oriented to person, place, and time. She appears well-developed and well-nourished. No distress.  HENT:  Head: Normocephalic and atraumatic.  Eyes: Conjunctivae and EOM are normal.  Cardiovascular: Regular rhythm.  Tachycardia present.  Exam reveals no friction rub.   Pulmonary/Chest: Effort normal and breath sounds normal. No stridor. No respiratory distress.  Abdominal: She exhibits no distension.  Musculoskeletal: She exhibits no edema.  Neurological: She is alert and oriented to person, place, and time. No cranial nerve  deficit.  Skin: Skin is warm and dry.  Psychiatric: She has a normal mood and affect.  Nursing note and vitals reviewed.    ED Treatments / Results  Labs (all labs ordered are listed, but only abnormal results are displayed) Labs Reviewed  CBC WITH DIFFERENTIAL/PLATELET - Abnormal; Notable for the following:       Result Value   Hemoglobin 11.8 (*)    All other components within normal limits  COMPREHENSIVE METABOLIC PANEL - Abnormal; Notable for the following:    ALT 12 (*)    All other components within normal limits  MRSA PCR SCREENING  T4, FREE  TSH  PROTIME-INR  APTT  BASIC METABOLIC PANEL  CBC  I-STAT TROPOININ, ED     EKG  EKG Interpretation  Date/Time:  Thursday November 22 2015 18:38:28 EDT Ventricular Rate:  102 PR Interval:  132 QRS Duration: 68 QT Interval:  340 QTC Calculation: 443 R Axis:   13 Text Interpretation:  Sinus tachycardia Cannot rule out Anterior infarct , age undetermined Abnormal ECG Abnormal ekg Confirmed by Gerhard MunchLOCKWOOD, Sharnese Heath  MD [phone]([id number]) on 11/22/2015 7:47:06 PM       Radiology Ct Chest W Contrast  Result Date: 11/22/2015 CLINICAL DATA:  45 year old female with chest pain and shortness of breath. EXAM: CT CHEST WITH CONTRAST TECHNIQUE: Multidetector CT imaging of the chest was performed during intravenous contrast administration. CONTRAST:  75mL ISOVUE-300 IOPAMIDOL (ISOVUE-300) INJECTION 61% COMPARISON:  None. FINDINGS: Cardiovascular: A moderate to large pericardial effusion is noted. Heart is otherwise unremarkable. There is no evidence of thoracic aortic aneurysm. Mediastinum/Nodes: No enlarged lymph nodes or mediastinal mass. Lungs/Pleura: Mild bibasilar atelectasis noted. There is no evidence of airspace disease, consolidation, nodule, mass, pleural effusion or pneumothorax. Upper Abdomen: Unremarkable Musculoskeletal: No acute or suspicious abnormalities. IMPRESSION: Moderate to large pericardial effusion. These results will be called to the ordering clinician or representative by the Radiologist Assistant, and communication documented in the PACS or zVision Dashboard. Electronically Signed   By: Harmon PierJeffrey  Hu M.D.   On: 11/22/2015 17:45    Procedures Procedures (including critical care time)  Medications Ordered in ED Medications  acetaminophen (TYLENOL) tablet 650 mg (not administered)  ondansetron (ZOFRAN) injection 4 mg (not administered)  colchicine tablet 0.6 mg (not administered)  ketorolac (TORADOL) 30 MG/ML injection 30 mg (30 mg Intravenous Given 11/22/15 2115)  colchicine tablet 1.2 mg (1.2 mg Oral Given 11/22/15 2255)   After my initial evaluation I discussed  the patient's case with our cardiology team. Cardiology performed the ultrasound consistent with pericardial effusion.  On repeat exam the patient remains in similar condition.   Initial Impression / Assessment and Plan / ED Course  I have reviewed the triage vital signs and the nursing notes.  Pertinent labs & imaging results that were available during my care of the patient were reviewed by me and considered in my medical decision making (see chart for details).  Patient presents with ongoing chest pain, dyspnea, worsening with exertion. Here she is awake and alert, but continued to complain of chest discomfort. CT scan, bedside ultrasound both consistent with pericardial effusion, likely chronic given the absence of decompensation. Patient has no history of coronary disease, nor substantial illness, was admitted for further evaluation, management, likely pericardiocentesis tomorrow.    Gerhard Munchobert Nerissa Constantin, MD 11/22/15 2330

## 2015-11-22 NOTE — H&P (Signed)
History & Physical    Patient ID: Brady Plant MRN: [id number], DOB/AGE: [birth year]   Admit date: 11/22/2015   Primary Physician: Pcp Not In System Primary Cardiologist: None  Patient Profile    Alicia Wood is a 64 y o female with 2 m history fo CP and SOB, now with incidental finding of pericardial effusion on CT scan  Past Medical History    Past Medical History:  Diagnosis Date  . Hyperlipidemia     Past Surgical History:  Procedure Laterality Date  . ABDOMINAL HYSTERECTOMY    . APPENDECTOMY    . PARTIAL HYSTERECTOMY    . TUBAL LIGATION       Allergies  No Known Allergies  History of Present Illness    Alicia Wood has no sig  PMH and is no medications,. She works for Public affairs consultant at [employer]. She noted progressive SOB and CP. Initially very mild abut has been getting worse. She has seen several outpatient providers and also seen in [facility] ED. A cause has not been identified till she had a CT scan to evaluate her lungs which found a moderate to large pericardial effusion. She reports CP at rest and with activity. Her pain is somewhat worse when laying flat. Her SOB is present with minimal activity but also with supine position. This has been progressive as well. No presyncope or syncope. No recent infections, no exposure to chemicals or sick people in general though she works at [employer].    No smoking, no illicit drug use and only occasional ethanol use.   Home Medications    Prior to Admission medications   Medication Sig Start Date End Date Taking? Authorizing Provider  HYDROcodone-acetaminophen (NORCO) 5-325 MG tablet Take 1 tablet by mouth every 6 (six) hours as needed for moderate pain. Patient not taking: Reported on 11/22/2015 11/11/15   Elvina Sidle, MD  predniSONE (DELTASONE) 20 MG tablet Two daily with food Patient not taking: Reported on 11/22/2015 11/11/15   Elvina Sidle, MD    Family History    No family history on  file.  Social History    Social History   Social History  . Marital status: Married    Spouse name: N/A  . Number of children: N/A  . Years of education: N/A   Occupational History  . Not on file.   Social History Main Topics  . Smoking status: Never Smoker  . Smokeless tobacco: Never Used  . Alcohol use Yes     Comment: occasional  . Drug use: No  . Sexual activity: Not on file   Other Topics Concern  . Not on file   Social History Narrative  . No narrative on file     Review of Systems    General:  No chills, fever, night sweats or weight changes.  Cardiovascular:  No chest pain, dyspnea on exertion, edema, orthopnea, palpitations, paroxysmal nocturnal dyspnea. Dermatological: No rash, lesions/masses Respiratory: No cough, dyspnea Urologic: No hematuria, dysuria Abdominal:   No nausea, vomiting, diarrhea, bright red blood per rectum, melena, or hematemesis Neurologic:  No visual changes, wkns, changes in mental status. All other systems reviewed and are otherwise negative except as noted above.  Physical Exam    Blood pressure (!) 135/101, pulse 97, temperature 99.3 F (37.4 C), temperature source Oral, resp. rate 14, height 5\' 8"  (1.727 m), weight 120.7 kg (266 lb), SpO2 100 %.  General: Pleasant, NAD Psych: Normal affect. Neuro: Alert and oriented X 3. Moves  all extremities spontaneously. HEENT: Normal  Neck: Supple without bruits or JVD. Lungs:  Resp regular and unlabored, CTA. Heart: RRR no s3, s4, or murmurs. Abdomen: Soft, non-tender, non-distended, BS + x 4.  Extremities: No clubbing, cyanosis or edema. DP/PT/Radials 2+ and equal bilaterally.  Labs    Troponin [facility](Point of Care Test)  Recent Labs  11/22/15 1909  TROPIPOC 0.00   No results for input(s): CKTOTAL, CKMB, TROPONINI in the last 72 hours. Lab Results  Component Value Date   WBC 8.4 11/22/2015   HGB 11.8 (L) 11/22/2015   HCT 36.8 11/22/2015   MCV 89.3 11/22/2015   PLT 387  11/22/2015    Recent Labs Lab 11/22/15 1845  NA 135  K 4.4  CL 101  CO2 24  BUN 10  CREATININE 0.82  CALCIUM 9.6  PROT 7.4  BILITOT 1.0  ALKPHOS 80  ALT 12*  AST 18  GLUCOSE 89   No results found for: CHOL, HDL, LDLCALC, TRIG No results found for: [facility]   Radiology Studies    Dg Chest 2 View  Result Date: 11/11/2015 CLINICAL DATA:  Chest pain EXAM: CHEST  2 VIEW COMPARISON:  08/20/2015 FINDINGS: Mild cardiomegaly that is similar to previous, especially in the lateral projection. Stable aortic and hilar contours. There is no edema, consolidation, effusion, or pneumothorax. IMPRESSION: 1. No acute finding. 2. Mild cardiomegaly. Electronically Signed   By: Marnee SpringJonathon  Watts M.D.   On: 11/11/2015 18:36   Ct Chest W Contrast  Result Date: 11/22/2015 CLINICAL DATA:  45 year old female with chest pain and shortness of breath. EXAM: CT CHEST WITH CONTRAST TECHNIQUE: Multidetector CT imaging of the chest was performed during intravenous contrast administration. CONTRAST:  75mL ISOVUE-300 IOPAMIDOL (ISOVUE-300) INJECTION 61% COMPARISON:  None. FINDINGS: Cardiovascular: A moderate to large pericardial effusion is noted. Heart is otherwise unremarkable. There is no evidence of thoracic aortic aneurysm. Mediastinum/Nodes: No enlarged lymph nodes or mediastinal mass. Lungs/Pleura: Mild bibasilar atelectasis noted. There is no evidence of airspace disease, consolidation, nodule, mass, pleural effusion or pneumothorax. Upper Abdomen: Unremarkable Musculoskeletal: No acute or suspicious abnormalities. IMPRESSION: Moderate to large pericardial effusion. These results will be called to the ordering clinician or representative by the Radiologist Assistant, and communication documented in the PACS or zVision Dashboard. Electronically Signed   By: Harmon PierJeffrey  Hu M.D.   On: 11/22/2015 17:45    ECG & Cardiac Imaging    NSR, decreased voltage, PR depression in I-III. No ST changes suggestive of pericarditis or  ischemic changes.   Assessment & Plan    Alicia Wood is 45 y o. She has no sign PMH. She is found to have a pericardial effusion on CT scan which was done to workup SOB and CP. I performed a bedside TTE. It confirmed a moderate-large sized effusion. Mild RA/RV collapse. Could not perform MV inflow doppler with the machine available to me. Her pulsus paradoxus was 6mmHg. Her SBP was 110 and HR 90's. I do believe that her symptoms are explained by her pericardial effusion. I sont know the etiology of her effusion but suspect inflammatory/infectious rather than malignant. Her pain is likely from pericarditis.   Plan: - Colchizine loading dose now and daily dosing from then on - TTE in am - NPO for likely tap - TFT to rule out thryroid etiology  Signed, Macario GoldsMarat Jehan Ranganathan, MD 11/22/2015, 9:44 PM

## 2015-11-22 NOTE — ED Triage Notes (Signed)
Pt st's she has had left chest heaviness off and on x's 2 months with shortness of breath.  Pt st's she was seen by her MD today and had a chest x-ray.  St's she was told to come to the ED ref. Fluid around her heart

## 2015-11-23 ENCOUNTER — Inpatient Hospital Stay (HOSPITAL_COMMUNITY): Payer: 59

## 2015-11-23 DIAGNOSIS — I319 Disease of pericardium, unspecified: Secondary | ICD-10-CM

## 2015-11-23 LAB — BASIC METABOLIC PANEL
Anion gap: 11 (ref 5–15)
BUN: 16 mg/dL (ref 6–20)
CO2: 26 mmol/L (ref 22–32)
Calcium: 9.4 mg/dL (ref 8.9–10.3)
Chloride: 102 mmol/L (ref 101–111)
Creatinine, Ser: 1.1 mg/dL — ABNORMAL HIGH (ref 0.44–1.00)
GFR calc Af Amer: >60 mL/min (ref >60)
GFR calc non Af Amer: 59 mL/min — ABNORMAL LOW (ref >60)
Glucose, Bld: 132 mg/dL — ABNORMAL HIGH (ref 65–99)
Potassium: 4.5 mmol/L (ref 3.5–5.1)
Sodium: 139 mmol/L (ref 135–145)

## 2015-11-23 LAB — CBC
HCT: 32.2 % — ABNORMAL LOW (ref 36.0–46.0)
Hemoglobin: 10.2 g/dL — ABNORMAL LOW (ref 12.0–15.0)
MCH: 28.7 pg (ref 26.0–34.0)
MCHC: 31.7 g/dL (ref 30.0–36.0)
MCV: 90.4 fL (ref 78.0–100.0)
Platelets: 332 10*3/uL (ref 150–400)
RBC: 3.56 MIL/uL — ABNORMAL LOW (ref 3.87–5.11)
RDW: 14.6 % (ref 11.5–15.5)
WBC: 9.5 10*3/uL (ref 4.0–10.5)

## 2015-11-23 LAB — ECHOCARDIOGRAM COMPLETE
Height: 68 in
Weight: 4256 oz

## 2015-11-23 LAB — MRSA PCR SCREENING: MRSA by PCR: NEGATIVE

## 2015-11-23 LAB — TSH: TSH: 0.273 u[IU]/mL — ABNORMAL LOW (ref 0.350–4.500)

## 2015-11-23 LAB — PROTIME-INR
INR: 1.02
Prothrombin Time: 13.4 seconds (ref 11.4–15.2)

## 2015-11-23 LAB — APTT: aPTT: 35 seconds (ref 24–36)

## 2015-11-23 LAB — T4, FREE: Free T4: 0.96 ng/dL (ref 0.61–1.12)

## 2015-11-23 MED ORDER — MORPHINE SULFATE (PF) 2 MG/ML IV SOLN
1.0000 mg | Freq: Once | INTRAVENOUS | Status: AC
  Administered 2015-11-23: 1 mg via INTRAVENOUS
  Filled 2015-11-23: qty 1

## 2015-11-23 MED ORDER — MORPHINE SULFATE (PF) 2 MG/ML IV SOLN
1.0000 mg | INTRAVENOUS | Status: DC | PRN
  Administered 2015-11-23 – 2015-11-28 (×6): 2 mg via INTRAVENOUS
  Filled 2015-11-23 (×6): qty 1

## 2015-11-23 MED ORDER — IBUPROFEN 200 MG PO TABS
800.0000 mg | ORAL_TABLET | Freq: Three times a day (TID) | ORAL | Status: DC
  Administered 2015-11-23 (×3): 800 mg via ORAL
  Filled 2015-11-23 (×3): qty 4

## 2015-11-23 MED ORDER — COLCHICINE 0.6 MG PO TABS
0.6000 mg | ORAL_TABLET | Freq: Two times a day (BID) | ORAL | Status: DC
  Administered 2015-11-23 – 2015-11-29 (×12): 0.6 mg via ORAL
  Filled 2015-11-23 (×12): qty 1

## 2015-11-23 NOTE — Progress Notes (Signed)
*  PRELIMINARY RESULTS* Echocardiogram 2D Echocardiogram has been performed.  Jeryl Columbialliott, Jackelynn Hosie 11/23/2015, 10:17 AM

## 2015-11-23 NOTE — Progress Notes (Signed)
Patient Name: Alicia Wood Date of Encounter: 11/23/2015  Hospital Problem List     Active Problems:   Pericardial effusion    Patient Profile     Alicia Wood is 45 y o. She has no sign PMH. She presented with chest pain and progressive SOB. She is found to have a pericardial effusion on CT scan which was done to workup SOB and CP. I performed a bedside TTE.    Subjective   Chest pain this morning.  Moderately uncomfortable. No acute SOB  Inpatient Medications    . colchicine  0.6 mg Oral Daily    Vital Signs    Vitals:   11/23/15 0400 11/23/15 0600 11/23/15 0700 11/23/15 0800  BP: 103/71 (!) 84/58 95/65 104/71  Pulse: 89 81 84 85  Resp: (!) 25 (!) 21 (!) 23 18  Temp: 98.5 F (36.9 C)   98.4 F (36.9 C)  TempSrc: Oral   Oral  SpO2: 100% 99% 96% 97%  Weight:      Height:       No intake or output data in the 24 hours ending 11/23/15 0930 Filed Weights   11/22/15 1848  Weight: 266 lb (120.7 kg)    Physical Exam    GEN: Well nourished, well developed, in  no acute distress.  Neck: Supple, no JVD, carotid bruits, or masses. Cardiac: RRR, no rubs, or gallops. No clubbing, cyanosis, no edema.  Radials/DP/PT 2+ and equal bilaterally.  Respiratory:  Respirations  regular and unlabored, clear to auscultation bilaterally. GI: Soft, nontender, nondistended, BS + x 4. Neuro:  Strength and sensation are intact.   Labs    CBC  Recent Labs  11/22/15 1845 11/23/15 0622  WBC 8.4 9.5  NEUTROABS 6.3  --   HGB 11.8* 10.2*  HCT 36.8 32.2*  MCV 89.3 90.4  PLT 387 332   Basic Metabolic Panel  Recent Labs  11/22/15 1845 11/23/15 0622  NA 135 139  K 4.4 4.5  CL 101 102  CO2 24 26  GLUCOSE 89 132*  BUN 10 16  CREATININE 0.82 1.10*  CALCIUM 9.6 9.4   Liver Function Tests  Recent Labs  11/22/15 1845  AST 18  ALT 12*  ALKPHOS 80  BILITOT 1.0  PROT 7.4  ALBUMIN 3.9   No results for input(s): LIPASE, AMYLASE in the last 72 hours. Cardiac  Enzymes No results for input(s): CKTOTAL, CKMB, CKMBINDEX, TROPONINI in the last 72 hours. BNP Invalid input(s): POCBNP D-Dimer No results for input(s): DDIMER in the last 72 hours. Hemoglobin A1C No results for input(s): HGBA1C in the last 72 hours. Fasting Lipid Panel No results for input(s): CHOL, HDL, LDLCALC, TRIG, CHOLHDL, LDLDIRECT in the last 72 hours. Thyroid Function Tests  Recent Labs  11/22/15 2347  TSH 0.273*    Telemetry   Sinus tachycardiac  ECG   Repeat pending  Radiology    Dg Chest 2 View  Result Date: 11/11/2015 CLINICAL DATA:  Chest pain EXAM: CHEST  2 VIEW COMPARISON:  08/20/2015 FINDINGS: Mild cardiomegaly that is similar to previous, especially in the lateral projection. Stable aortic and hilar contours. There is no edema, consolidation, effusion, or pneumothorax. IMPRESSION: 1. No acute finding. 2. Mild cardiomegaly. Electronically Signed   By: Marnee SpringJonathon  Watts M.D.   On: 11/11/2015 18:36   Ct Chest W Contrast  Result Date: 11/22/2015 CLINICAL DATA:  45 year old female with chest pain and shortness of breath. EXAM: CT CHEST WITH CONTRAST TECHNIQUE: Multidetector CT imaging of the  chest was performed during intravenous contrast administration. CONTRAST:  75mL ISOVUE-300 IOPAMIDOL (ISOVUE-300) INJECTION 61% COMPARISON:  None. FINDINGS: Cardiovascular: A moderate to large pericardial effusion is noted. Heart is otherwise unremarkable. There is no evidence of thoracic aortic aneurysm. Mediastinum/Nodes: No enlarged lymph nodes or mediastinal mass. Lungs/Pleura: Mild bibasilar atelectasis noted. There is no evidence of airspace disease, consolidation, nodule, mass, pleural effusion or pneumothorax. Upper Abdomen: Unremarkable Musculoskeletal: No acute or suspicious abnormalities. IMPRESSION: Moderate to large pericardial effusion. These results will be called to the ordering clinician or representative by the Radiologist Assistant, and communication documented in  the PACS or zVision Dashboard. Electronically Signed   By: Harmon Pier M.D.   On: 11/22/2015 17:45    Assessment & Plan    PERICARDIAL EFFUSION:   Echo pending.  I will increase the colchicine and add high dose ibuprofen.  Review echo.  Pericardicentesis if there is suggestion of tamponade.  She does have a borderline BP but no pulsus paradox on exam.    ANEMIA:  No evidence of active bleeding.  Follow CBCs and check stool guaiac.    DECREASED TSH:  T4 is normal.  No change in medical therapy at this point.   Signed, Rollene Rotunda, MD  11/23/2015, 9:30 AM

## 2015-11-24 DIAGNOSIS — R0781 Pleurodynia: Secondary | ICD-10-CM

## 2015-11-24 LAB — TROPONIN I: Troponin I: <0.03 ng/mL (ref <0.03)

## 2015-11-24 LAB — SEDIMENTATION RATE: Sed Rate: 30 mm/hr — ABNORMAL HIGH (ref 0–22)

## 2015-11-24 LAB — C-REACTIVE PROTEIN: CRP: 11.3 mg/dL — ABNORMAL HIGH (ref <1.0)

## 2015-11-24 MED ORDER — NAPROXEN 250 MG PO TABS
500.0000 mg | ORAL_TABLET | Freq: Two times a day (BID) | ORAL | Status: DC
  Administered 2015-11-24 – 2015-11-26 (×5): 500 mg via ORAL
  Filled 2015-11-24 (×6): qty 2

## 2015-11-24 MED ORDER — SODIUM CHLORIDE 0.9% FLUSH: 10.0000 mL | INTRAVENOUS | Status: DC | PRN

## 2015-11-24 MED ORDER — PANTOPRAZOLE SODIUM 40 MG PO TBEC
40.0000 mg | DELAYED_RELEASE_TABLET | Freq: Every day | ORAL | Status: DC
  Administered 2015-11-24 – 2015-11-29 (×6): 40 mg via ORAL
  Filled 2015-11-24 (×6): qty 1

## 2015-11-24 MED ORDER — PANTOPRAZOLE SODIUM 40 MG PO TBEC: 40.0000 mg | DELAYED_RELEASE_TABLET | Freq: Every day | ORAL | Status: DC

## 2015-11-24 MED ORDER — FAMOTIDINE IN NACL 20-0.9 MG/50ML-% IV SOLN
20.0000 mg | Freq: Once | INTRAVENOUS | Status: AC
Start: 2015-11-24 — End: 2015-11-24
  Administered 2015-11-24: 20 mg via INTRAVENOUS
  Filled 2015-11-24: qty 50

## 2015-11-24 MED ORDER — PREDNISONE 20 MG PO TABS
40.0000 mg | ORAL_TABLET | Freq: Every day | ORAL | Status: AC
  Administered 2015-11-24 – 2015-11-25 (×2): 40 mg via ORAL
  Filled 2015-11-24 (×2): qty 2

## 2015-11-24 MED ORDER — SODIUM CHLORIDE 0.9% FLUSH
10.0000 mL | Freq: Two times a day (BID) | INTRAVENOUS | Status: DC
  Administered 2015-11-24 – 2015-11-26 (×5): 10 mL
  Administered 2015-11-26: 20 mL
  Administered 2015-11-27 (×2): 10 mL

## 2015-11-24 NOTE — Progress Notes (Signed)
Pt vomited, Zofran given, MD updated, nursing will cont to monitor

## 2015-11-24 NOTE — Progress Notes (Addendum)
CARDIOLOGY ROUNDING NOTE  Patient Name: Alicia Wood Date of Encounter: 11/24/2015   Patient Profile     Alicia Wood is 45 y o. She has no sign PMH. She presented with chest pain and progressive SOB. She is found to have a pericardial effusion on CT scan which was done to workup SOB and CP.   Subjective    This am crying with CP. WOrse with deep breathing, sitting up, lying back. Any movement.    Relieved with Morphine. Then complained of nausea and got zofran. Now has lost IV and IV team trying to get PIV  No recent rashes, arthalgias, fever, etc.  HR 84 SBP 111   Inpatient Medications    . colchicine  0.6 mg Oral BID  . famotidine (PEPCID) IV  20 mg Intravenous Once  . ibuprofen  800 mg Oral TID  . pantoprazole  40 mg Oral Q0600    Vital Signs    Vitals:   11/23/15 2200 11/24/15 0000 11/24/15 0400 11/24/15 0815  BP: 96/83 111/85 99/66 111/75  Pulse: 85 83 74 76  Resp: 19 18 17  (!) 22  Temp: 98 F (36.7 C)  98.1 F (36.7 C) 97.9 F (36.6 C)  TempSrc: Oral  Oral Oral  SpO2: 100% 100% 94% 100%  Weight:      Height:       No intake or output data in the 24 hours ending 11/24/15 0841 Filed Weights   11/22/15 1848  Weight: 120.7 kg (266 lb)    Physical Exam    GEN: Well nourished, well developed, in  no acute distress.  Neck: Supple, no JVD, carotid bruits, or masses. Cardiac: RRR, no rubs, or gallops. No clubbing, cyanosis, no edema.  Radials/DP/PT 2+ and equal bilaterally.  Respiratory:  Respirations  regular and unlabored, clear to auscultation bilaterally. GI: Soft, nontender, nondistended, BS + x 4. Neuro:  Strength and sensation are intact. No hot joints or rashes   Labs    CBC  Recent Labs  11/22/15 1845 11/23/15 0622  WBC 8.4 9.5  NEUTROABS 6.3  --   HGB 11.8* 10.2*  HCT 36.8 32.2*  MCV 89.3 90.4  PLT 387 332   Basic Metabolic Panel  Recent Labs  11/22/15 1845 11/23/15 0622  NA 135 139  K 4.4 4.5  CL 101 102  CO2 24 26    GLUCOSE 89 132*  BUN 10 16  CREATININE 0.82 1.10*  CALCIUM 9.6 9.4   Liver Function Tests  Recent Labs  11/22/15 1845  AST 18  ALT 12*  ALKPHOS 80  BILITOT 1.0  PROT 7.4  ALBUMIN 3.9   No results for input(s): LIPASE, AMYLASE in the last 72 hours. Cardiac Enzymes No results for input(s): CKTOTAL, CKMB, CKMBINDEX, TROPONINI in the last 72 hours. BNP Invalid input(s): POCBNP D-Dimer No results for input(s): DDIMER in the last 72 hours. Hemoglobin A1C No results for input(s): HGBA1C in the last 72 hours. Fasting Lipid Panel No results for input(s): CHOL, HDL, LDLCALC, TRIG, CHOLHDL, LDLDIRECT in the last 72 hours. Thyroid Function Tests  Recent Labs  11/22/15 2347  TSH 0.273*    Telemetry   NSR 80s  Radiology    Dg Chest 2 View  Result Date: 11/11/2015 CLINICAL DATA:  Chest pain EXAM: CHEST  2 VIEW COMPARISON:  08/20/2015 FINDINGS: Mild cardiomegaly that is similar to previous, especially in the lateral projection. Stable aortic and hilar contours. There is no edema, consolidation, effusion, or pneumothorax. IMPRESSION: 1. No  acute finding. 2. Mild cardiomegaly. Electronically Signed   By: Marnee SpringJonathon  Watts M.D.   On: 11/11/2015 18:36   Ct Chest W Contrast  Result Date: 11/22/2015 CLINICAL DATA:  45 year old female with chest pain and shortness of breath. EXAM: CT CHEST WITH CONTRAST TECHNIQUE: Multidetector CT imaging of the chest was performed during intravenous contrast administration. CONTRAST:  75mL ISOVUE-300 IOPAMIDOL (ISOVUE-300) INJECTION 61% COMPARISON:  None. FINDINGS: Cardiovascular: A moderate to large pericardial effusion is noted. Heart is otherwise unremarkable. There is no evidence of thoracic aortic aneurysm. Mediastinum/Nodes: No enlarged lymph nodes or mediastinal mass. Lungs/Pleura: Mild bibasilar atelectasis noted. There is no evidence of airspace disease, consolidation, nodule, mass, pleural effusion or pneumothorax. Upper Abdomen: Unremarkable  Musculoskeletal: No acute or suspicious abnormalities. IMPRESSION: Moderate to large pericardial effusion. These results will be called to the ordering clinician or representative by the Radiologist Assistant, and communication documented in the PACS or zVision Dashboard. Electronically Signed   By: Harmon PierJeffrey  Hu M.D.   On: 11/22/2015 17:45    Assessment     1. PERICARDIAL EFFUSION/pleuritic CP  2. ANEMIA:  No evidence of active bleeding.  3. DECREASED TSH:  T4 is normal.  No change in medical therapy at this point.    Plan/Discussion    I have reviewed echo images personally and there is a moderate to large circumfrential pericardial effusion. There is no tamponade. IVC is not dilated. No significant respiratory variation in mitral inflow. No evidence of PAH.   Denies recent viral illness, arthralgias, rash or fevers.   Will need work-up for rheumatologic disorders and TB: Check ANA, RF, HIV.  Continue colchicine. Given persistent CP will stop NSAIDS and start steroids and PPI. Repeat echo in several days to reassess need for tap.   May need PICC line if IV access remains an issue.     Arvilla MeresBensimhon, Garet Hooton, MD  11/24/2015, 8:41 AM

## 2015-11-24 NOTE — Progress Notes (Signed)
This note also relates to the following rows which could not be included: BP - Cannot attach notes to unvalidated device data MAP (mmHg) - Cannot attach notes to unvalidated device data Pulse Rate - Cannot attach notes to unvalidated device data ECG Heart Rate - Cannot attach notes to unvalidated device data Resp - Cannot attach notes to unvalidated device data SpO2 - Cannot attach notes to unvalidated device data    11/24/15 0735  Vitals  BP Location Left Arm  BP Method Automatic  Patient Position (if appropriate) Lying  Pulse Rate Source Monitor  Cardiac Rhythm NSR  Oxygen Therapy  O2 Device Nasal Cannula  O2 Flow Rate (L/min) 2 L/min  Pain Assessment  Pain Assessment 0-10  Pain Score 10  Pain Type Acute pain  Pain Location Chest  Pain Orientation Mid;Left  Pain Descriptors / Indicators Heaviness;Pressure  Pain Frequency Constant  Pain Intervention(s) Medication (See eMAR)  Glasgow Coma Scale  Eye Opening 4  Best Verbal Response (NON-intubated) 5  Best Motor Response 6  Glasgow Coma Scale Score 15  pt c/o CP, pt OBS to be crying, pt VSS, pt placed on 2L O2 and EKG done, pt answering questions aprop, per Night RN pt c/o this pain during the night however not in tears as seen this am, MD called awaiting on call back for update

## 2015-11-24 NOTE — Progress Notes (Signed)
Peripherally Inserted Central Catheter/Midline Placement  The IV Nurse has discussed with the patient and/or persons authorized to consent for the patient, the purpose of this procedure and the potential benefits and risks involved with this procedure.  The benefits include less needle sticks, lab draws from the catheter, ability to perform PICC exchange if ordered by the physician and patient may be discharged home with the catheter.  Risks include, but not limited to, infection, bleeding, blood clot (thrombus formation), and puncture of an artery; nerve damage and irregular heat beat.  Alternatives to this procedure were also discussed. Bard educational information left with pt.  PICC/Midline Placement Documentation  PICC Double Lumen 11/24/15 PICC Right Cephalic 40 cm 1 cm (Active)  Indication for Insertion or Continuance of Line Limited venous access - need for IV therapy >5 days (PICC only);Poor Vasculature-patient has had multiple peripheral attempts or PIVs lasting less than 24 hours 11/24/2015 11:09 AM  Exposed Catheter (cm) 1 cm 11/24/2015 11:09 AM  Site Assessment Clean;Dry;Intact 11/24/2015 11:09 AM  Lumen #1 Status Flushed;Saline locked;Blood return noted 11/24/2015 11:09 AM  Lumen #2 Status Flushed;Saline locked;Blood return noted 11/24/2015 11:09 AM  Dressing Type Transparent 11/24/2015 11:09 AM  Dressing Status Clean;Dry;Intact;Antimicrobial disc in place 11/24/2015 11:09 AM  Line Care Connections checked and tightened 11/24/2015 11:09 AM  Line Adjustment (NICU/IV Team Only) No 11/24/2015 11:09 AM  Dressing Intervention New dressing 11/24/2015 11:09 AM  Dressing Change Due 12/01/15 11/24/2015 11:09 AM       Elliot Dallyiggs, Mycheal Veldhuizen Wright 11/24/2015, 11:09 AM

## 2015-11-25 LAB — CBC
HCT: 32.3 % — ABNORMAL LOW (ref 36.0–46.0)
Hemoglobin: 10.3 g/dL — ABNORMAL LOW (ref 12.0–15.0)
MCH: 28.4 pg (ref 26.0–34.0)
MCHC: 31.9 g/dL (ref 30.0–36.0)
MCV: 89 fL (ref 78.0–100.0)
Platelets: 341 10*3/uL (ref 150–400)
RBC: 3.63 MIL/uL — ABNORMAL LOW (ref 3.87–5.11)
RDW: 14 % (ref 11.5–15.5)
WBC: 10.3 10*3/uL (ref 4.0–10.5)

## 2015-11-25 LAB — RHEUMATOID FACTOR: Rhuematoid fact SerPl-aCnc: 12.6 IU/mL (ref 0.0–13.9)

## 2015-11-25 LAB — HIV ANTIBODY (ROUTINE TESTING W REFLEX): HIV Screen 4th Generation wRfx: NONREACTIVE

## 2015-11-25 MED ORDER — MAGNESIUM HYDROXIDE 400 MG/5ML PO SUSP
30.0000 mL | Freq: Once | ORAL | Status: AC
  Administered 2015-11-25: 30 mL via ORAL
  Filled 2015-11-25: qty 30

## 2015-11-25 NOTE — Progress Notes (Signed)
SUBJECTIVE:  Chest pain much improved today.   OBJECTIVE:   Vitals:   Vitals:   11/25/15 0000 11/25/15 0039 11/25/15 0400 11/25/15 0800  BP: 108/72  116/73   Pulse: 77  66   Resp: (!) 24  17   Temp:  99 F (37.2 C) 97.9 F (36.6 C) 98.4 F (36.9 C)  TempSrc:  Oral Oral Oral  SpO2: 96%  96%   Weight:      Height:       I&O's:   Intake/Output Summary (Last 24 hours) at 11/25/15 1029 Last data filed at 11/25/15 0900  Gross per 24 hour  Intake              700 ml  Output                0 ml  Net              700 ml   TELEMETRY: Reviewed telemetry pt in NSR:     PHYSICAL EXAM General: Well developed, well nourished, in no acute distress Head: Eyes PERRLA, No xanthomas.   Normal cephalic and atramatic  Lungs:   Clear bilaterally to auscultation and percussion. Heart:   HRRR S1 S2 Pulses are 2+ & equal.  No audible rub today Abdomen: Bowel sounds are positive, abdomen soft and non-tender without masses  Msk:  Back normal, normal gait. Normal strength and tone for age. Extremities:   No clubbing, cyanosis or edema.  DP +1 Neuro: Alert and oriented X 3. Psych:  Good affect, responds appropriately   LABS: Basic Metabolic Panel:  Recent Labs  16/10/96 1845 11/23/15 0622  NA 135 139  K 4.4 4.5  CL 101 102  CO2 24 26  GLUCOSE 89 132*  BUN 10 16  CREATININE 0.82 1.10*  CALCIUM 9.6 9.4   Liver Function Tests:  Recent Labs  11/22/15 1845  AST 18  ALT 12*  ALKPHOS 80  BILITOT 1.0  PROT 7.4  ALBUMIN 3.9   No results for input(s): LIPASE, AMYLASE in the last 72 hours. CBC:  Recent Labs  11/22/15 1845 11/23/15 0622 11/25/15 0241  WBC 8.4 9.5 10.3  NEUTROABS 6.3  --   --   HGB 11.8* 10.2* 10.3*  HCT 36.8 32.2* 32.3*  MCV 89.3 90.4 89.0  PLT 387 332 341   Cardiac Enzymes:  Recent Labs  11/24/15 0840  TROPONINI <0.03   BNP: Invalid input(s): POCBNP D-Dimer: No results for input(s): DDIMER in the last 72 hours. Hemoglobin A1C: No results  for input(s): HGBA1C in the last 72 hours. Fasting Lipid Panel: No results for input(s): CHOL, HDL, LDLCALC, TRIG, CHOLHDL, LDLDIRECT in the last 72 hours. Thyroid Function Tests:  Recent Labs  11/22/15 2347  TSH 0.273*   Anemia Panel: No results for input(s): VITAMINB12, FOLATE, FERRITIN, TIBC, IRON, RETICCTPCT in the last 72 hours. Coag Panel:   Lab Results  Component Value Date   INR 1.02 11/22/2015    RADIOLOGY: Dg Chest 2 View  Result Date: 11/11/2015 CLINICAL DATA:  Chest pain EXAM: CHEST  2 VIEW COMPARISON:  08/20/2015 FINDINGS: Mild cardiomegaly that is similar to previous, especially in the lateral projection. Stable aortic and hilar contours. There is no edema, consolidation, effusion, or pneumothorax. IMPRESSION: 1. No acute finding. 2. Mild cardiomegaly. Electronically Signed   By: Marnee Spring M.D.   On: 11/11/2015 18:36   Ct Chest W Contrast  Result Date: 11/22/2015 CLINICAL DATA:  45 year old female with chest pain  and shortness of breath. EXAM: CT CHEST WITH CONTRAST TECHNIQUE: Multidetector CT imaging of the chest was performed during intravenous contrast administration. CONTRAST:  75mL ISOVUE-300 IOPAMIDOL (ISOVUE-300) INJECTION 61% COMPARISON:  None. FINDINGS: Cardiovascular: A moderate to large pericardial effusion is noted. Heart is otherwise unremarkable. There is no evidence of thoracic aortic aneurysm. Mediastinum/Nodes: No enlarged lymph nodes or mediastinal mass. Lungs/Pleura: Mild bibasilar atelectasis noted. There is no evidence of airspace disease, consolidation, nodule, mass, pleural effusion or pneumothorax. Upper Abdomen: Unremarkable Musculoskeletal: No acute or suspicious abnormalities. IMPRESSION: Moderate to large pericardial effusion. These results will be called to the ordering clinician or representative by the Radiologist Assistant, and communication documented in the PACS or zVision Dashboard. Electronically Signed   By: Harmon PierJeffrey  Hu M.D.   On:  11/22/2015 17:45    Assessment     1. PERICARDIAL EFFUSION/pleuritic CP.  Echo with moderate to large circumfrential pericardial effusion. There is no tamponade. IVC is not dilated. No significant respiratory variation in mitral inflow. No evidence of PAH. Denies recent viral illness, arthralgias, rash or fevers.  ANA, RF, HIV serologies pending.  CRP elevated and sed rate 30 c/w pericarditis.  Continue Colchicine and NSAIDs for now.  Steroids started yesterday due to persistent severe pleuritic CP with significant improvement in chest pain. Continue PPI for GI prophylaxis.  Repeat echo tomorrow.  2. ANEMIA:  No evidence of active bleeding.  3. DECREASED TSH:  T4 is normal.  No change in medical therapy at this point.    Armanda Magicraci Turner, MD  11/25/2015  10:29 AM

## 2015-11-26 NOTE — Progress Notes (Signed)
Pt complained of pressure in the chest 5/10 radiating to the neck. Blood pressure 116/79. Morphine 2mg  given. PA made aware. Pt is now sleeping. Continue to monitor. Curtis SitesHayley H Fenna Semel, RN

## 2015-11-26 NOTE — Progress Notes (Signed)
Patient Name: Alicia Wood Date of Encounter: 11/26/2015  Hospital Problem List     Active Problems:   Pericardial effusion    Patient Profile     Alicia Wood is 45 y o. She has no sign PMH. She presented with chest pain and progressive SOB. She is found to have a pericardial effusion on CT scan which was done to workup SOB and CP.   Echo without tamponade.  She required steroids briefly and colchicine with PRN morphine for pain management.    Subjective   Chest pain this morning.  Moderately uncomfortable. No acute SOB  Inpatient Medications    . colchicine  0.6 mg Oral BID  . naproxen  500 mg Oral BID WC  . pantoprazole  40 mg Oral Daily  . sodium chloride flush  10-40 mL Intracatheter Q12H    Vital Signs    Vitals:   11/25/15 2009 11/26/15 0020 11/26/15 0325 11/26/15 0807  BP: 124/79 118/75 127/89 (!) 104/59  Pulse: 79 73 83 67  Resp: 18   (!) 21  Temp: 98.5 F (36.9 C) 98.1 F (36.7 C) 98.8 F (37.1 C) 98 F (36.7 C)  TempSrc: Oral Oral Oral Oral  SpO2: 99% 94% 97% 100%  Weight:      Height:        Intake/Output Summary (Last 24 hours) at 11/26/15 0828 Last data filed at 11/26/15 0331  Gross per 24 hour  Intake              850 ml  Output              350 ml  Net              500 ml   Filed Weights   11/22/15 1848  Weight: 266 lb (120.7 kg)    Physical Exam    GEN: Well nourished, well developed, in  no acute distress.  Neck: Supple, no JVD, carotid bruits, or masses. Cardiac: RRR, no rubs, or gallops. No clubbing, cyanosis, no edema.  Radials/DP/PT 2+ and equal bilaterally.  Respiratory:  Respirations  regular and unlabored, clear to auscultation bilaterally. GI: Soft, nontender, nondistended, BS + x 4. Neuro:  Strength and sensation are intact.   Labs    CBC  Recent Labs  11/25/15 0241  WBC 10.3  HGB 10.3*  HCT 32.3*  MCV 89.0  PLT 341   Basic Metabolic Panel No results for input(s): NA, K, CL, CO2, GLUCOSE, BUN, CREATININE,  CALCIUM, MG, PHOS in the last 72 hours. Liver Function Tests No results for input(s): AST, ALT, ALKPHOS, BILITOT, PROT, ALBUMIN in the last 72 hours. No results for input(s): LIPASE, AMYLASE in the last 72 hours. Cardiac Enzymes  Recent Labs  11/24/15 0840  TROPONINI <0.03   BNP Invalid input(s): POCBNP D-Dimer No results for input(s): DDIMER in the last 72 hours. Hemoglobin A1C No results for input(s): HGBA1C in the last 72 hours. Fasting Lipid Panel No results for input(s): CHOL, HDL, LDLCALC, TRIG, CHOLHDL, LDLDIRECT in the last 72 hours. Thyroid Function Tests No results for input(s): TSH, T4TOTAL, T3FREE, THYROIDAB in the last 72 hours.  Invalid input(s): FREET3  Telemetry   Sinus tachycardiac  ECG   Repeat pending  Radiology    Dg Chest 2 View  Result Date: 11/11/2015 CLINICAL DATA:  Chest pain EXAM: CHEST  2 VIEW COMPARISON:  08/20/2015 FINDINGS: Mild cardiomegaly that is similar to previous, especially in the lateral projection. Stable aortic and hilar contours. There  is no edema, consolidation, effusion, or pneumothorax. IMPRESSION: 1. No acute finding. 2. Mild cardiomegaly. Electronically Signed   By: Marnee Spring M.D.   On: 11/11/2015 18:36   Ct Chest W Contrast  Result Date: 11/22/2015 CLINICAL DATA:  45 year old female with chest pain and shortness of breath. EXAM: CT CHEST WITH CONTRAST TECHNIQUE: Multidetector CT imaging of the chest was performed during intravenous contrast administration. CONTRAST:  75mL ISOVUE-300 IOPAMIDOL (ISOVUE-300) INJECTION 61% COMPARISON:  None. FINDINGS: Cardiovascular: A moderate to large pericardial effusion is noted. Heart is otherwise unremarkable. There is no evidence of thoracic aortic aneurysm. Mediastinum/Nodes: No enlarged lymph nodes or mediastinal mass. Lungs/Pleura: Mild bibasilar atelectasis noted. There is no evidence of airspace disease, consolidation, nodule, mass, pleural effusion or pneumothorax. Upper Abdomen:  Unremarkable Musculoskeletal: No acute or suspicious abnormalities. IMPRESSION: Moderate to large pericardial effusion. These results will be called to the ordering clinician or representative by the Radiologist Assistant, and communication documented in the PACS or zVision Dashboard. Electronically Signed   By: Harmon Pier M.D.   On: 11/22/2015 17:45    Assessment & Plan    PERICARDIAL EFFUSION:    Repeat echo today.  Anticipate discharge tomorrow if her pain is controlled and she is ambulating OK and the effusion is not larger.   Now on narpoxen.    ANEMIA:  No evidence of active bleeding.  Guaiac is stable.  DECREASED TSH:  T4 is normal.  No change in medical therapy at this point.   Signed, Rollene Rotunda, MD  11/26/2015, 8:28 AM

## 2015-11-27 ENCOUNTER — Inpatient Hospital Stay (HOSPITAL_COMMUNITY): Payer: 59

## 2015-11-27 DIAGNOSIS — I319 Disease of pericardium, unspecified: Secondary | ICD-10-CM

## 2015-11-27 LAB — ECHOCARDIOGRAM LIMITED
Height: 68 in
Weight: 4256 oz

## 2015-11-27 LAB — QUANTIFERON IN TUBE
QFT TB AG MINUS NIL VALUE: 0.03 IU/mL
QUANTIFERON MITOGEN VALUE: 7.07 IU/mL
QUANTIFERON TB AG VALUE: 0.07 IU/mL
QUANTIFERON TB GOLD: NEGATIVE
Quantiferon Nil Value: 0.04 IU/mL

## 2015-11-27 LAB — QUANTIFERON TB GOLD ASSAY (BLOOD)

## 2015-11-27 LAB — ANTINUCLEAR ANTIBODIES, IFA: ANA Ab, IFA: NEGATIVE

## 2015-11-27 MED ORDER — SODIUM CHLORIDE 0.9% FLUSH
10.0000 mL | Freq: Two times a day (BID) | INTRAVENOUS | Status: DC
  Administered 2015-11-27 – 2015-11-28 (×2): 10 mL
  Administered 2015-11-28: 20 mL

## 2015-11-27 MED ORDER — ASPIRIN 81 MG PO CHEW
81.0000 mg | CHEWABLE_TABLET | Freq: Every day | ORAL | Status: DC
  Administered 2015-11-27 – 2015-11-29 (×3): 81 mg via ORAL
  Filled 2015-11-27 (×3): qty 1

## 2015-11-27 MED ORDER — SODIUM CHLORIDE 0.9% FLUSH: 10.0000 mL | INTRAVENOUS | Status: DC | PRN

## 2015-11-27 MED ORDER — NAPROXEN 500 MG PO TABS
500.0000 mg | ORAL_TABLET | Freq: Three times a day (TID) | ORAL | Status: DC
  Administered 2015-11-27 – 2015-11-29 (×8): 500 mg via ORAL
  Filled 2015-11-27 (×10): qty 1

## 2015-11-27 NOTE — Progress Notes (Signed)
Pt request bath linen and stated she will do bath later. Tech gave Pt all bath supplies.

## 2015-11-27 NOTE — Progress Notes (Signed)
  Echocardiogram 2D Echocardiogram has been performed.  Janalyn HarderWest, Decarla Siemen R 11/27/2015, 9:10 AM

## 2015-11-27 NOTE — Progress Notes (Signed)
Patient ID: Alicia Wood, female   DOB: 07/04/1970, 45 y.o.   MRN: [id number]  Echo shows decrease in size of effusion and no tamponade Ok to d/c home Will sign off and arrange outpatient f/u with Dr Alonza BogusHochrein  Alexya Mcdaris

## 2015-11-27 NOTE — Progress Notes (Signed)
Clarified with md about the transfer to telemetry who claimed that if tight up with beds she could be transferred. Discussed with charge nurse.

## 2015-11-27 NOTE — Progress Notes (Signed)
Patient ID: Alicia Wood, female   DOB: 08/28/1970, 45 y.o.   MRN: [id number]   Patient Name: Alicia Wood Date of Encounter: 11/27/2015  Hospital Problem List     Active Problems:   Pericardial effusion    Patient Profile     Alicia Wood is 45 y o. She has no sign PMH. She presented with chest pain and progressive SOB. She is found to have a pericardial effusion on CT scan which was done to workup SOB and CP.   Echo without tamponade.  She required steroids briefly and colchicine with PRN morphine for pain management.    Subjective   Chest pain this morning.  Moderately uncomfortable. No acute SOB  Inpatient Medications    . colchicine  0.6 mg Oral BID  . naproxen  500 mg Oral BID WC  . pantoprazole  40 mg Oral Daily  . sodium chloride flush  10-40 mL Intracatheter Q12H    Vital Signs    Vitals:   11/26/15 1609 11/26/15 2000 11/27/15 0000 11/27/15 0400  BP: 104/73 106/81 (!) 124/94 122/89  Pulse: 68  92 72  Resp: 18 12 18 18   Temp: 98.3 F (36.8 C) 98.5 F (36.9 C) 98.4 F (36.9 C)   TempSrc: Oral Oral Oral   SpO2: 99% 99% 97% 99%  Weight:      Height:        Intake/Output Summary (Last 24 hours) at 11/27/15 0751 Last data filed at 11/26/15 2000  Gross per 24 hour  Intake              699 ml  Output                0 ml  Net              699 ml   Filed Weights   11/22/15 1848  Weight: 266 lb (120.7 kg)    Physical Exam    GEN: Well nourished, well developed, in  no acute distress.  Neck: Supple, no JVD, carotid bruits, or masses. Cardiac: RRR, no rubs, or gallops. No clubbing, cyanosis, no edema.  Radials/DP/PT 2+ and equal bilaterally.  Respiratory:  Respirations  regular and unlabored, clear to auscultation bilaterally. GI: Soft, nontender, nondistended, BS + x 4. Neuro:  Strength and sensation are intact.   Labs    CBC  Recent Labs  11/25/15 0241  WBC 10.3  HGB 10.3*  HCT 32.3*  MCV 89.0  PLT 341   Basic Metabolic Panel No results  for input(s): NA, K, CL, CO2, GLUCOSE, BUN, CREATININE, CALCIUM, MG, PHOS in the last 72 hours. Liver Function Tests No results for input(s): AST, ALT, ALKPHOS, BILITOT, PROT, ALBUMIN in the last 72 hours. No results for input(s): LIPASE, AMYLASE in the last 72 hours. Cardiac Enzymes  Recent Labs  11/24/15 0840  TROPONINI <0.03   BNP Invalid input(s): POCBNP D-Dimer No results for input(s): DDIMER in the last 72 hours. Hemoglobin A1C No results for input(s): HGBA1C in the last 72 hours. Fasting Lipid Panel No results for input(s): CHOL, HDL, LDLCALC, TRIG, CHOLHDL, LDLDIRECT in the last 72 hours. Thyroid Function Tests No results for input(s): TSH, T4TOTAL, T3FREE, THYROIDAB in the last 72 hours.  Invalid input(s): FREET3  Telemetry   Sinus tachycardiac  ECG   Repeat pending  Radiology    Dg Chest 2 View  Result Date: 11/11/2015 CLINICAL DATA:  Chest pain EXAM: CHEST  2 VIEW COMPARISON:  08/20/2015 FINDINGS: Mild cardiomegaly that is  similar to previous, especially in the lateral projection. Stable aortic and hilar contours. There is no edema, consolidation, effusion, or pneumothorax. IMPRESSION: 1. No acute finding. 2. Mild cardiomegaly. Electronically Signed   By: Marnee Spring M.D.   On: 11/11/2015 18:36   Ct Chest W Contrast  Result Date: 11/22/2015 CLINICAL DATA:  45 year old female with chest pain and shortness of breath. EXAM: CT CHEST WITH CONTRAST TECHNIQUE: Multidetector CT imaging of the chest was performed during intravenous contrast administration. CONTRAST:  75mL ISOVUE-300 IOPAMIDOL (ISOVUE-300) INJECTION 61% COMPARISON:  None. FINDINGS: Cardiovascular: A moderate to large pericardial effusion is noted. Heart is otherwise unremarkable. There is no evidence of thoracic aortic aneurysm. Mediastinum/Nodes: No enlarged lymph nodes or mediastinal mass. Lungs/Pleura: Mild bibasilar atelectasis noted. There is no evidence of airspace disease, consolidation, nodule,  mass, pleural effusion or pneumothorax. Upper Abdomen: Unremarkable Musculoskeletal: No acute or suspicious abnormalities. IMPRESSION: Moderate to large pericardial effusion. These results will be called to the ordering clinician or representative by the Radiologist Assistant, and communication documented in the PACS or zVision Dashboard. Electronically Signed   By: Harmon Pier M.D.   On: 11/22/2015 17:45    Assessment & Plan    PERICARDIAL EFFUSION:    Repeat echo today.  D/c pending pain control and f/u echo make sure effusion not Larger continue colchicine increase naproxen to tid and add low dose asa    ANEMIA:  No evidence of active bleeding.  Guaiac is stable.  DECREASED TSH:  T4 is normal.  No change in medical therapy at this point.   Have called echo lab to try to get echo this am    Signed, Charlton Haws, MD  11/27/2015, 7:51 AM

## 2015-11-28 MED ORDER — METHYLPREDNISOLONE SODIUM SUCC 125 MG IJ SOLR
60.0000 mg | Freq: Every day | INTRAMUSCULAR | Status: DC
  Administered 2015-11-28: 60 mg via INTRAVENOUS
  Filled 2015-11-28 (×2): qty 2

## 2015-11-28 NOTE — Progress Notes (Signed)
Patient ID: Alicia Wood, female   DOB: 1970-09-30, 45 y.o.   MRN: [id number]  Patient ID: Alicia Wood, female   DOB: 04-03-1970, 45 y.o.   MRN: [id number]   Patient Name: Alicia Wood Date of Encounter: 11/28/2015  Hospital Problem List     Active Problems:   Pericardial effusion    Patient Profile     Mrs Alicia Wood is 45 y o. She has no sign PMH. She presented with chest pain and progressive SOB. She is found to have a pericardial effusion on CT scan which was done to workup SOB and CP.   Echo without tamponade.  She required steroids briefly and colchicine with PRN morphine for pain management.    Subjective   Chest pain this morning.  Moderately uncomfortable. No acute SOB  Inpatient Medications    . aspirin  81 mg Oral Daily  . colchicine  0.6 mg Oral BID  . naproxen  500 mg Oral TID WC  . pantoprazole  40 mg Oral Daily  . sodium chloride flush  10-40 mL Intracatheter Q12H  . sodium chloride flush  10-40 mL Intracatheter Q12H    Vital Signs    Vitals:   11/27/15 1522 11/27/15 2016 11/27/15 2355 11/28/15 0400  BP: 104/70 101/69 115/72 (!) 86/55  Pulse: 78 78    Resp: 20 15 16  (!) 25  Temp: 98.6 F (37 C) 98.2 F (36.8 C) 98.3 F (36.8 C)   TempSrc: Oral Oral Oral   SpO2: 99% 98% 98%   Weight:      Height:        Intake/Output Summary (Last 24 hours) at 11/28/15 0746 Last data filed at 11/27/15 2200  Gross per 24 hour  Intake              680 ml  Output                0 ml  Net              680 ml   Filed Weights   11/22/15 1848  Weight: 120.7 kg (266 lb)    Physical Exam    GEN: Well nourished, well developed, in  no acute distress.  Neck: Supple, no JVD, carotid bruits, or masses. Cardiac: RRR, no rubs, or gallops. No clubbing, cyanosis, no edema.  Radials/DP/PT 2+ and equal bilaterally.  Respiratory:  Respirations  regular and unlabored, clear to auscultation bilaterally. GI: Soft, nontender, nondistended, BS + x 4. Neuro:  Strength and  sensation are intact.   Labs    CBC No results for input(s): WBC, NEUTROABS, HGB, HCT, MCV, PLT in the last 72 hours. Basic Metabolic Panel No results for input(s): NA, K, CL, CO2, GLUCOSE, BUN, CREATININE, CALCIUM, MG, PHOS in the last 72 hours. Liver Function Tests No results for input(s): AST, ALT, ALKPHOS, BILITOT, PROT, ALBUMIN in the last 72 hours. No results for input(s): LIPASE, AMYLASE in the last 72 hours. Cardiac Enzymes No results for input(s): CKTOTAL, CKMB, CKMBINDEX, TROPONINI in the last 72 hours. BNP Invalid input(s): POCBNP D-Dimer No results for input(s): DDIMER in the last 72 hours. Hemoglobin A1C No results for input(s): HGBA1C in the last 72 hours. Fasting Lipid Panel No results for input(s): CHOL, HDL, LDLCALC, TRIG, CHOLHDL, LDLDIRECT in the last 72 hours. Thyroid Function Tests No results for input(s): TSH, T4TOTAL, T3FREE, THYROIDAB in the last 72 hours.  Invalid input(s): FREET3  Telemetry   Sinus tachycardiac  ECG   Repeat pending  Radiology  Dg Chest 2 View  Result Date: 11/11/2015 CLINICAL DATA:  Chest pain EXAM: CHEST  2 VIEW COMPARISON:  08/20/2015 FINDINGS: Mild cardiomegaly that is similar to previous, especially in the lateral projection. Stable aortic and hilar contours. There is no edema, consolidation, effusion, or pneumothorax. IMPRESSION: 1. No acute finding. 2. Mild cardiomegaly. Electronically Signed   By: Marnee SpringJonathon  Watts M.D.   On: 11/11/2015 18:36   Ct Chest W Contrast  Result Date: 11/22/2015 CLINICAL DATA:  45 year old female with chest pain and shortness of breath. EXAM: CT CHEST WITH CONTRAST TECHNIQUE: Multidetector CT imaging of the chest was performed during intravenous contrast administration. CONTRAST:  75mL ISOVUE-300 IOPAMIDOL (ISOVUE-300) INJECTION 61% COMPARISON:  None. FINDINGS: Cardiovascular: A moderate to large pericardial effusion is noted. Heart is otherwise unremarkable. There is no evidence of thoracic  aortic aneurysm. Mediastinum/Nodes: No enlarged lymph nodes or mediastinal mass. Lungs/Pleura: Mild bibasilar atelectasis noted. There is no evidence of airspace disease, consolidation, nodule, mass, pleural effusion or pneumothorax. Upper Abdomen: Unremarkable Musculoskeletal: No acute or suspicious abnormalities. IMPRESSION: Moderate to large pericardial effusion. These results will be called to the ordering clinician or representative by the Radiologist Assistant, and communication documented in the PACS or zVision Dashboard. Electronically Signed   By: Harmon PierJeffrey  Hu M.D.   On: 11/22/2015 17:45    Assessment & Plan    PERICARDIAL EFFUSION:    Decreased by echo f/u still with lots of pain ? Secondary gain Talking about FML and not working at [employer].  Discussed with DB will give steroid taper and  Plan d/c in am if possible   ANEMIA:  No evidence of active bleeding.  Guaiac is stable.  DECREASED TSH:  T4 is normal.  No change in medical therapy at this point.      Signed, Charlton HawsPeter Nishan, MD  11/28/2015, 7:46 AM

## 2015-11-29 ENCOUNTER — Telehealth: Payer: Self-pay | Admitting: Cardiology

## 2015-11-29 LAB — SEDIMENTATION RATE: Sed Rate: 64 mm/hr — ABNORMAL HIGH (ref 0–22)

## 2015-11-29 MED ORDER — PREDNISONE 20 MG PO TABS: 30.0000 mg | ORAL_TABLET | Freq: Every day | ORAL | Status: DC

## 2015-11-29 MED ORDER — NAPROXEN 500 MG PO TABS
500.0000 mg | ORAL_TABLET | Freq: Three times a day (TID) | ORAL | 0 refills | Status: DC
Start: 2015-11-29 — End: 2015-12-10

## 2015-11-29 MED ORDER — PREDNISONE 10 MG PO TABS
10.0000 mg | ORAL_TABLET | Freq: Every day | ORAL | Status: DC
Start: 2015-12-08 — End: 2015-11-29

## 2015-11-29 MED ORDER — PREDNISONE 20 MG PO TABS
40.0000 mg | ORAL_TABLET | Freq: Every day | ORAL | Status: DC
  Administered 2015-11-29: 40 mg via ORAL
  Filled 2015-11-29: qty 2

## 2015-11-29 MED ORDER — PREDNISONE 20 MG PO TABS: 20.0000 mg | ORAL_TABLET | Freq: Every day | ORAL | Status: DC

## 2015-11-29 MED ORDER — COLCHICINE 0.6 MG PO TABS: 0.6000 mg | ORAL_TABLET | Freq: Two times a day (BID) | ORAL | 0 refills | Status: DC

## 2015-11-29 MED ORDER — PREDNISONE 20 MG PO TABS: 40.0000 mg | ORAL_TABLET | Freq: Every day | ORAL | 0 refills | Status: DC

## 2015-11-29 MED ORDER — ASPIRIN 81 MG PO CHEW
81.0000 mg | CHEWABLE_TABLET | Freq: Every day | ORAL | Status: DC
Start: 2015-11-29 — End: 2015-12-21

## 2015-11-29 NOTE — Telephone Encounter (Signed)
Received FMLA forms from [employer] for Dr Antoine PocheHochrein or [name] PA to complete and sign.  Received via fax - no Auth/Pmt to [employer].  Sent to [employer] @ [employer] for letter to be sent to patient with instructions for processing.  Sent to [employer] [facility] [employer] via [employer] on bag being picked up 11/30/15. lp

## 2015-11-29 NOTE — Discharge Summary (Signed)
Discharge Summary    Patient ID: Alicia Wood,  MRN: [id number], DOB/AGE: 47-31-1972 47 y.o.  Admit date: 11/22/2015 Discharge date: 11/29/2015  Primary Care Provider: Pcp Not In System Primary Cardiologist: Dr. [name]  Discharge Diagnoses    Active Problems:   Pericardial effusion   Allergies No Known Allergies  Diagnostic Studies/Procedures    Echo: 11/23/15  Study Conclusions  - Left ventricle: The cavity size was normal. Wall thickness was   increased in a pattern of mild LVH. Systolic function was normal.   The estimated ejection fraction was in the range of 60% to 65%. - Atrial septum: No defect or patent foramen ovale was identified. - Pericardium, extracardiac: Moderate pericardial effuson more   posterior and lateral   IVC has respiratory variation some RA inversion no definitvie   tamponade.  Echo: 11/27/2015  Study Conclusions  - Left ventricle: The cavity size was normal. Wall thickness was   normal. Systolic function was normal. The estimated ejection   fraction was in the range of 55% to 60%. - Pericardium, extracardiac: A small to moderate pericardial   effusion was identified. There was no evidence of hemodynamic   compromise. Features were not consistent with tamponade   physiology. _____________   History of Present Illness     Ms Alicia Wood has no sig PMH and is on no medications,. She works for Public affairs consultant at [employer]. She noted progressive SOB and CP. Initially very mild but has been getting worse. She has seen several outpatient providers and also seen in [facility] ED. A cause has not been identified till she had a CT scan to evaluate her lungs which found a moderate to large pericardial effusion. She reports CP at rest and with activity. Her pain is somewhat worse when laying flat. Her SOB is present with minimal activity but also with supine position. This has been progressive as well. No presyncope or syncope. No recent  infections, no exposure to chemicals or sick people in general though she works at [employer]. No smoking, no illicit drug use and only occasional ethanol use.    Hospital Course     Consultants: None  She was found to have a pericardial effusion on a CT scan, and a bedside TTE was performed by the Cardiology fellow showing a moderate-large sized effusion, mild RA/RV collapse. At that time it was suspected effusion was from inflammatory process rather than infectious. She was loaded with colchizine on admission and made NPO for a possible tap.   Her TSH was checked and normal, no pulsus paradox was noted on exam. CBC did display anemia, but no active bleeding reported and guaiac was negative. Follow up echo was reviewed showing moderate to large circumferential pericardial effusion but no tamponade. She her colchicine was increased and ibuprofen was added.   She continued to report chest pain and NSAIDs were stopped, steroids and PPI started. Plan was to repeat echo in a few days to reassess the need for a tap. Of note, difficulty was noted in maintaining IV access and a PICC line was placed. Her labs were significant for elevated CRP and sed rate consistent with pericarditis. Steroids were stopped and naproxen was added to her medication regimen.    Her follow up echo on 9/5 showed a decrease in the size of effusion and no tamponade. She did continue to report ongoing pain. Steroid taper was started and she was observed overnight given return of pain.  On 9/7 she was  seen and assessed by Dr. Eden Emms and determined stable for discharge home. No further reports of chest pain. Will discharge home on steroid taper, ASA and colchicine. I have arranged for a 2 week follow up in the office with an APP, will need long term follow up with Dr. [name].  _____________  Discharge Vitals Blood pressure 111/85, pulse 74, temperature 97.9 F (36.6 C), temperature source Oral, resp. rate 18, height 5\' 8"   (1.727 m), weight 266 lb (120.7 kg), SpO2 98 %.  Filed Weights   11/22/15 1848  Weight: 266 lb (120.7 kg)    Labs & Radiologic Studies    CBC No results for input(s): WBC, NEUTROABS, HGB, HCT, MCV, PLT in the last 72 hours. Basic Metabolic Panel No results for input(s): NA, K, CL, CO2, GLUCOSE, BUN, CREATININE, CALCIUM, MG, PHOS in the last 72 hours. Liver Function Tests No results for input(s): AST, ALT, ALKPHOS, BILITOT, PROT, ALBUMIN in the last 72 hours. No results for input(s): LIPASE, AMYLASE in the last 72 hours. Cardiac Enzymes No results for input(s): CKTOTAL, CKMB, CKMBINDEX, TROPONINI in the last 72 hours. BNP Invalid input(s): POCBNP D-Dimer No results for input(s): DDIMER in the last 72 hours. Hemoglobin A1C No results for input(s): HGBA1C in the last 72 hours. Fasting Lipid Panel No results for input(s): CHOL, HDL, LDLCALC, TRIG, CHOLHDL, LDLDIRECT in the last 72 hours. Thyroid Function Tests No results for input(s): TSH, T4TOTAL, T3FREE, THYROIDAB in the last 72 hours.  Invalid input(s): FREET3 _____________   Ct Chest W Contrast  Result Date: 11/22/2015 CLINICAL DATA:  45 year old female with chest pain and shortness of breath. EXAM: CT CHEST WITH CONTRAST TECHNIQUE: Multidetector CT imaging of the chest was performed during intravenous contrast administration. CONTRAST:  75mL ISOVUE-300 IOPAMIDOL (ISOVUE-300) INJECTION 61% COMPARISON:  None. FINDINGS: Cardiovascular: A moderate to large pericardial effusion is noted. Heart is otherwise unremarkable. There is no evidence of thoracic aortic aneurysm. Mediastinum/Nodes: No enlarged lymph nodes or mediastinal mass. Lungs/Pleura: Mild bibasilar atelectasis noted. There is no evidence of airspace disease, consolidation, nodule, mass, pleural effusion or pneumothorax. Upper Abdomen: Unremarkable Musculoskeletal: No acute or suspicious abnormalities. IMPRESSION: Moderate to large pericardial effusion. These results will be  called to the ordering clinician or representative by the Radiologist Assistant, and communication documented in the PACS or zVision Dashboard. Electronically Signed   By: Harmon Pier M.D.   On: 11/22/2015 17:45   Disposition   Pt is being discharged home today in good condition.  Follow-up Plans & Appointments    Follow-up Information    Azalee Course, [place] Follow up on 12/14/2015.   Specialties:  Cardiology, Radiology Why:  1:30pm for your hospital follow up.  Contact information: [address] [place] [address] [phone]          Discharge Instructions    Diet - low sodium heart healthy    Complete by:  As directed   Increase activity slowly    Complete by:  As directed      Discharge Medications   Current Discharge Medication List    START taking these medications   Details  aspirin 81 MG chewable tablet Chew 1 tablet (81 mg total) by mouth daily.    colchicine 0.6 MG tablet Take 1 tablet (0.6 mg total) by mouth 2 (two) times daily. Qty: 42 tablet, Refills: 0    naproxen (NAPROSYN) 500 MG tablet Take 1 tablet (500 mg total) by mouth 3 (three) times daily with meals. Qty: 90 tablet, Refills:  0      CONTINUE these medications which have CHANGED   Details  predniSONE (DELTASONE) 20 MG tablet Take 2 tablets (40 mg total) by mouth daily with breakfast. Qty: 15 tablet, Refills: 0      CONTINUE these medications which have NOT CHANGED   Details  HYDROcodone-acetaminophen (NORCO) 5-325 MG tablet Take 1 tablet by mouth every 6 (six) hours as needed for moderate pain. Qty: 12 tablet, Refills: 0          Outstanding Labs/Studies   None  Duration of Discharge Encounter   Greater than 30 minutes including physician time.  Signed, Laverda PageLindsay Jeromie Gainor NP-C 11/29/2015, 11:24 AM

## 2015-11-29 NOTE — Progress Notes (Signed)
Patient ID: Elantra Caprara, female   DOB: Aug 11, 1970, 45 y.o.   MRN: [id number]   Patient Name: Margarete Horace Date of Encounter: 11/29/2015  Hospital Problem List     Active Problems:   Pericardial effusion    Patient Profile     [name] is 55 y o. She has no sign PMH. She presented with chest pain and progressive SOB. She is found to have a pericardial effusion on CT scan which was done to workup SOB and CP.   Echo without tamponade.  She required steroids briefly and colchicine with PRN morphine for pain management.    Subjective   Much better with steroids   Inpatient Medications    . aspirin  81 mg Oral Daily  . colchicine  0.6 mg Oral BID  . methylPREDNISolone (SOLU-MEDROL) injection  60 mg Intravenous Daily  . naproxen  500 mg Oral TID WC  . pantoprazole  40 mg Oral Daily  . sodium chloride flush  10-40 mL Intracatheter Q12H    Vital Signs    Vitals:   11/28/15 2346 11/29/15 0300 11/29/15 0400 11/29/15 0757  BP: 128/83 125/83 125/83 111/85  Pulse:      Resp: 18 16  18   Temp: 98.2 F (36.8 C) 97.9 F (36.6 C)  97.9 F (36.6 C)  TempSrc: Oral Oral  Oral  SpO2: 99% 100%  98%  Weight:      Height:        Intake/Output Summary (Last 24 hours) at 11/29/15 0808 Last data filed at 11/28/15 1400  Gross per 24 hour  Intake              480 ml  Output                0 ml  Net              480 ml   Filed Weights   11/22/15 1848  Weight: 120.7 kg (266 lb)    Physical Exam    GEN: Well nourished, well developed, in  no acute distress.  Neck: Supple, no JVD, carotid bruits, or masses. Cardiac: RRR, no rubs, or gallops. No clubbing, cyanosis, no edema.  Radials/DP/PT 2+ and equal bilaterally.  Respiratory:  Respirations  regular and unlabored, clear to auscultation bilaterally. GI: Soft, nontender, nondistended, BS + x 4. Neuro:  Strength and sensation are intact.   Labs    CBC No results for input(s): WBC, NEUTROABS, HGB, HCT, MCV, PLT in the last  72 hours. Basic Metabolic Panel No results for input(s): NA, K, CL, CO2, GLUCOSE, BUN, CREATININE, CALCIUM, MG, PHOS in the last 72 hours. Liver Function Tests No results for input(s): AST, ALT, ALKPHOS, BILITOT, PROT, ALBUMIN in the last 72 hours. No results for input(s): LIPASE, AMYLASE in the last 72 hours. Cardiac Enzymes No results for input(s): CKTOTAL, CKMB, CKMBINDEX, TROPONINI in the last 72 hours. BNP Invalid input(s): POCBNP D-Dimer No results for input(s): DDIMER in the last 72 hours. Hemoglobin A1C No results for input(s): HGBA1C in the last 72 hours. Fasting Lipid Panel No results for input(s): CHOL, HDL, LDLCALC, TRIG, CHOLHDL, LDLDIRECT in the last 72 hours. Thyroid Function Tests No results for input(s): TSH, T4TOTAL, T3FREE, THYROIDAB in the last 72 hours.  Invalid input(s): FREET3  Telemetry   Sinus tachycardiac  ECG   Repeat pending  Radiology    Dg Chest 2 View  Result Date: 11/11/2015 CLINICAL DATA:  Chest pain EXAM: CHEST  2 VIEW COMPARISON:  08/20/2015 FINDINGS: Mild cardiomegaly that is similar to previous, especially in the lateral projection. Stable aortic and hilar contours. There is no edema, consolidation, effusion, or pneumothorax. IMPRESSION: 1. No acute finding. 2. Mild cardiomegaly. Electronically Signed   By: Marnee SpringJonathon  Watts M.D.   On: 11/11/2015 18:36   Ct Chest W Contrast  Result Date: 11/22/2015 CLINICAL DATA:  45 year old female with chest pain and shortness of breath. EXAM: CT CHEST WITH CONTRAST TECHNIQUE: Multidetector CT imaging of the chest was performed during intravenous contrast administration. CONTRAST:  75mL ISOVUE-300 IOPAMIDOL (ISOVUE-300) INJECTION 61% COMPARISON:  None. FINDINGS: Cardiovascular: A moderate to large pericardial effusion is noted. Heart is otherwise unremarkable. There is no evidence of thoracic aortic aneurysm. Mediastinum/Nodes: No enlarged lymph nodes or mediastinal mass. Lungs/Pleura: Mild bibasilar  atelectasis noted. There is no evidence of airspace disease, consolidation, nodule, mass, pleural effusion or pneumothorax. Upper Abdomen: Unremarkable Musculoskeletal: No acute or suspicious abnormalities. IMPRESSION: Moderate to large pericardial effusion. These results will be called to the ordering clinician or representative by the Radiologist Assistant, and communication documented in the PACS or zVision Dashboard. Electronically Signed   By: Harmon PierJeffrey  Hu M.D.   On: 11/22/2015 17:45    Assessment & Plan    PERICARDIAL EFFUSION:    Decreased by echo f/u  D/c home steroid taper 40 mg 3 days 30 mg 3 days 20 mg 3 days 10 mg 3 days Continue ASA and colchicine Fills Script[facility] at [employer] [facility]  ANEMIA:  No evidence of active bleeding.  Guaiac is stable.  DECREASED TSH:  T4 is normal.  No change in medical therapy at this point.    D/c home f/u Hochrein   Signed, Charlton HawsPeter Nishan, MD  11/29/2015, 8:08 AM

## 2015-11-29 NOTE — Progress Notes (Signed)
RUA PICC d/c'ed per order.  Site without signs of infection. Cleaned with CHG, covered with vaseline guaze and dry 2x2 with occlusive dressing. Verbalizes signs of infection, dressing care, interventions for bleeding and signs of infection via teachback method.

## 2015-12-04 ENCOUNTER — Telehealth: Payer: Self-pay

## 2015-12-04 NOTE — Telephone Encounter (Signed)
Courtesy call for FMLA on behalf of [employer]:  Day 1 - Called patient and left a message.

## 2015-12-05 ENCOUNTER — Inpatient Hospital Stay (HOSPITAL_COMMUNITY)
Admission: EM | Admit: 2015-12-05 | Discharge: 2015-12-10 | DRG: 315 | Disposition: A | Discharge status: Home / Self Care | Payer: 59 | Attending: Internal Medicine | Admitting: Internal Medicine

## 2015-12-05 ENCOUNTER — Emergency Department (HOSPITAL_BASED_OUTPATIENT_CLINIC_OR_DEPARTMENT_OTHER): Payer: 59

## 2015-12-05 ENCOUNTER — Encounter (HOSPITAL_COMMUNITY): Payer: Self-pay | Admitting: Emergency Medicine

## 2015-12-05 ENCOUNTER — Emergency Department (HOSPITAL_COMMUNITY): Payer: 59

## 2015-12-05 DIAGNOSIS — R079 Chest pain, unspecified: Secondary | ICD-10-CM

## 2015-12-05 DIAGNOSIS — T380X5A Adverse effect of glucocorticoids and synthetic analogues, initial encounter: Secondary | ICD-10-CM | POA: Diagnosis present

## 2015-12-05 DIAGNOSIS — I272 Other secondary pulmonary hypertension: Secondary | ICD-10-CM | POA: Diagnosis not present

## 2015-12-05 DIAGNOSIS — I313 Pericardial effusion (noninflammatory): Secondary | ICD-10-CM | POA: Diagnosis not present

## 2015-12-05 DIAGNOSIS — Z79899 Other long term (current) drug therapy: Secondary | ICD-10-CM

## 2015-12-05 DIAGNOSIS — I311 Chronic constrictive pericarditis: Secondary | ICD-10-CM | POA: Diagnosis not present

## 2015-12-05 DIAGNOSIS — Z7982 Long term (current) use of aspirin: Secondary | ICD-10-CM

## 2015-12-05 DIAGNOSIS — I309 Acute pericarditis, unspecified: Principal | ICD-10-CM | POA: Diagnosis present

## 2015-12-05 DIAGNOSIS — I319 Disease of pericardium, unspecified: Secondary | ICD-10-CM | POA: Diagnosis not present

## 2015-12-05 DIAGNOSIS — I110 Hypertensive heart disease with heart failure: Secondary | ICD-10-CM | POA: Diagnosis present

## 2015-12-05 DIAGNOSIS — I950 Idiopathic hypotension: Secondary | ICD-10-CM | POA: Diagnosis not present

## 2015-12-05 DIAGNOSIS — R739 Hyperglycemia, unspecified: Secondary | ICD-10-CM | POA: Diagnosis present

## 2015-12-05 DIAGNOSIS — Z791 Long term (current) use of non-steroidal anti-inflammatories (NSAID): Secondary | ICD-10-CM

## 2015-12-05 DIAGNOSIS — D72829 Elevated white blood cell count, unspecified: Secondary | ICD-10-CM | POA: Diagnosis present

## 2015-12-05 DIAGNOSIS — I5032 Chronic diastolic (congestive) heart failure: Secondary | ICD-10-CM | POA: Diagnosis present

## 2015-12-05 DIAGNOSIS — I959 Hypotension, unspecified: Secondary | ICD-10-CM | POA: Diagnosis not present

## 2015-12-05 DIAGNOSIS — I951 Orthostatic hypotension: Secondary | ICD-10-CM | POA: Diagnosis not present

## 2015-12-05 DIAGNOSIS — R0602 Shortness of breath: Secondary | ICD-10-CM | POA: Diagnosis not present

## 2015-12-05 DIAGNOSIS — Z6841 Body Mass Index (BMI) 40.0 and over, adult: Secondary | ICD-10-CM | POA: Diagnosis not present

## 2015-12-05 DIAGNOSIS — E785 Hyperlipidemia, unspecified: Secondary | ICD-10-CM | POA: Diagnosis not present

## 2015-12-05 DIAGNOSIS — R072 Precordial pain: Secondary | ICD-10-CM | POA: Diagnosis present

## 2015-12-05 DIAGNOSIS — R0789 Other chest pain: Secondary | ICD-10-CM | POA: Diagnosis not present

## 2015-12-05 DIAGNOSIS — R0781 Pleurodynia: Secondary | ICD-10-CM | POA: Diagnosis not present

## 2015-12-05 DIAGNOSIS — K5900 Constipation, unspecified: Secondary | ICD-10-CM | POA: Diagnosis not present

## 2015-12-05 DIAGNOSIS — R197 Diarrhea, unspecified: Secondary | ICD-10-CM | POA: Diagnosis not present

## 2015-12-05 DIAGNOSIS — I3139 Other pericardial effusion (noninflammatory): Secondary | ICD-10-CM | POA: Diagnosis present

## 2015-12-05 DIAGNOSIS — R110 Nausea: Secondary | ICD-10-CM | POA: Diagnosis not present

## 2015-12-05 DIAGNOSIS — Z7952 Long term (current) use of systemic steroids: Secondary | ICD-10-CM

## 2015-12-05 HISTORY — DX: Pericardial effusion (noninflammatory): I31.3

## 2015-12-05 HISTORY — DX: Other pericardial effusion (noninflammatory): I31.39

## 2015-12-05 HISTORY — DX: Essential (primary) hypertension: I10

## 2015-12-05 LAB — ECHOCARDIOGRAM COMPLETE
Height: 68 in
Weight: 4256 oz

## 2015-12-05 LAB — HEPATIC FUNCTION PANEL
ALT: 18 U/L (ref 14–54)
AST: 14 U/L — ABNORMAL LOW (ref 15–41)
Albumin: 3 g/dL — ABNORMAL LOW (ref 3.5–5.0)
Alkaline Phosphatase: 74 U/L (ref 38–126)
Bilirubin, Direct: <0.1 mg/dL — ABNORMAL LOW (ref 0.1–0.5)
Total Bilirubin: 0.4 mg/dL (ref 0.3–1.2)
Total Protein: 6.2 g/dL — ABNORMAL LOW (ref 6.5–8.1)

## 2015-12-05 LAB — CBC
HCT: 32.6 % — ABNORMAL LOW (ref 36.0–46.0)
Hemoglobin: 10.2 g/dL — ABNORMAL LOW (ref 12.0–15.0)
MCH: 28.3 pg (ref 26.0–34.0)
MCHC: 31.3 g/dL (ref 30.0–36.0)
MCV: 90.3 fL (ref 78.0–100.0)
Platelets: 395 10*3/uL (ref 150–400)
RBC: 3.61 MIL/uL — ABNORMAL LOW (ref 3.87–5.11)
RDW: 14.4 % (ref 11.5–15.5)
WBC: 11.1 10*3/uL — ABNORMAL HIGH (ref 4.0–10.5)

## 2015-12-05 LAB — BASIC METABOLIC PANEL
Anion gap: 10 (ref 5–15)
BUN: 20 mg/dL (ref 6–20)
CO2: 24 mmol/L (ref 22–32)
Calcium: 8.7 mg/dL — ABNORMAL LOW (ref 8.9–10.3)
Chloride: 105 mmol/L (ref 101–111)
Creatinine, Ser: 0.88 mg/dL (ref 0.44–1.00)
GFR calc Af Amer: >60 mL/min (ref >60)
GFR calc non Af Amer: >60 mL/min (ref >60)
Glucose, Bld: 110 mg/dL — ABNORMAL HIGH (ref 65–99)
Potassium: 3.8 mmol/L (ref 3.5–5.1)
Sodium: 139 mmol/L (ref 135–145)

## 2015-12-05 LAB — TROPONIN I: Troponin I: 0.03 ng/mL (ref <0.03)

## 2015-12-05 LAB — I-STAT TROPONIN, ED
Troponin i, poc: 0 ng/mL (ref 0.00–0.08)
Troponin i, poc: 0 ng/mL (ref 0.00–0.08)

## 2015-12-05 LAB — SEDIMENTATION RATE: Sed Rate: 31 mm/hr — ABNORMAL HIGH (ref 0–22)

## 2015-12-05 LAB — LIPASE, BLOOD: Lipase: 19 U/L (ref 11–51)

## 2015-12-05 LAB — MRSA PCR SCREENING: MRSA by PCR: NEGATIVE

## 2015-12-05 LAB — C-REACTIVE PROTEIN: CRP: 3.7 mg/dL — ABNORMAL HIGH (ref <1.0)

## 2015-12-05 MED ORDER — HYDROCODONE-ACETAMINOPHEN 5-325 MG PO TABS
1.0000 | ORAL_TABLET | Freq: Four times a day (QID) | ORAL | Status: DC | PRN
  Administered 2015-12-06 – 2015-12-08 (×5): 1 via ORAL
  Filled 2015-12-05 (×6): qty 1

## 2015-12-05 MED ORDER — MORPHINE SULFATE (PF) 2 MG/ML IV SOLN: 2.0000 mg | INTRAVENOUS | Status: DC | PRN

## 2015-12-05 MED ORDER — IOPAMIDOL (ISOVUE-370) INJECTION 76%
INTRAVENOUS | Status: AC
  Administered 2015-12-05: 80 mL
  Filled 2015-12-05: qty 100

## 2015-12-05 MED ORDER — ONDANSETRON HCL 4 MG/2ML IJ SOLN: 4.0000 mg | Freq: Four times a day (QID) | INTRAMUSCULAR | Status: DC | PRN

## 2015-12-05 MED ORDER — FENTANYL CITRATE (PF) 100 MCG/2ML IJ SOLN
50.0000 ug | Freq: Once | INTRAMUSCULAR | Status: AC
  Administered 2015-12-05: 50 ug via INTRAVENOUS
  Filled 2015-12-05: qty 2

## 2015-12-05 MED ORDER — INFLUENZA VAC SPLIT QUAD 0.5 ML IM SUSY
0.5000 mL | PREFILLED_SYRINGE | INTRAMUSCULAR | Status: AC
  Administered 2015-12-07: 0.5 mL via INTRAMUSCULAR
  Filled 2015-12-05: qty 0.5

## 2015-12-05 MED ORDER — ENOXAPARIN SODIUM 40 MG/0.4ML ~~LOC~~ SOLN
40.0000 mg | SUBCUTANEOUS | Status: DC
  Administered 2015-12-05 – 2015-12-06 (×2): 40 mg via SUBCUTANEOUS
  Filled 2015-12-05 (×2): qty 0.4

## 2015-12-05 MED ORDER — ASPIRIN 81 MG PO CHEW
81.0000 mg | CHEWABLE_TABLET | Freq: Every day | ORAL | Status: DC
  Administered 2015-12-06 – 2015-12-10 (×5): 81 mg via ORAL
  Filled 2015-12-05 (×5): qty 1

## 2015-12-05 MED ORDER — SODIUM CHLORIDE 0.9 % IV SOLN
INTRAVENOUS | Status: DC
  Administered 2015-12-05: 12:00:00 via INTRAVENOUS

## 2015-12-05 MED ORDER — KETOROLAC TROMETHAMINE 30 MG/ML IJ SOLN
30.0000 mg | Freq: Once | INTRAMUSCULAR | Status: AC
  Administered 2015-12-05: 30 mg via INTRAVENOUS
  Filled 2015-12-05: qty 1

## 2015-12-05 MED ORDER — SODIUM CHLORIDE 0.9 % IV BOLUS (SEPSIS)
1000.0000 mL | Freq: Once | INTRAVENOUS | Status: AC
  Administered 2015-12-05: 1000 mL via INTRAVENOUS

## 2015-12-05 MED ORDER — COLCHICINE 0.6 MG PO TABS
0.6000 mg | ORAL_TABLET | Freq: Two times a day (BID) | ORAL | Status: DC
  Administered 2015-12-05 – 2015-12-10 (×10): 0.6 mg via ORAL
  Filled 2015-12-05 (×10): qty 1

## 2015-12-05 MED ORDER — PANTOPRAZOLE SODIUM 40 MG PO TBEC
40.0000 mg | DELAYED_RELEASE_TABLET | Freq: Every day | ORAL | Status: DC
  Administered 2015-12-05 – 2015-12-10 (×6): 40 mg via ORAL
  Filled 2015-12-05 (×6): qty 1

## 2015-12-05 MED ORDER — ACETAMINOPHEN 325 MG PO TABS
650.0000 mg | ORAL_TABLET | ORAL | Status: DC | PRN
  Administered 2015-12-06: 650 mg via ORAL
  Filled 2015-12-05: qty 2

## 2015-12-05 MED ORDER — SODIUM CHLORIDE 0.9 % IV SOLN
INTRAVENOUS | Status: DC
  Administered 2015-12-06 – 2015-12-09 (×4): via INTRAVENOUS

## 2015-12-05 MED ORDER — PREDNISONE 20 MG PO TABS
40.0000 mg | ORAL_TABLET | Freq: Every day | ORAL | Status: DC
  Administered 2015-12-06: 40 mg via ORAL
  Filled 2015-12-05: qty 2

## 2015-12-05 NOTE — ED Notes (Signed)
QNS for D-dimer only enough for Istat troponin.  Pt doesn't want to be stuck again.  Notified nurse.

## 2015-12-05 NOTE — ED Notes (Signed)
Myself and [place], EMT undressed patient, in gown, placed on monitor, continuous pulse oximetry and blood pressure cuff

## 2015-12-05 NOTE — ED Notes (Signed)
Dr little at bedside 

## 2015-12-05 NOTE — ED Triage Notes (Signed)
To ED via [facility] medic 32 from home with c/o chest pain-- right sided, started 3 days ago-- worse today== hurts to breath and move-- received ASa 324mg , NTG x 2sl, and Zofran 4mg  IV enroute-- Pt has a 20g in left hand per EMS.

## 2015-12-05 NOTE — H&P (Deleted)
History & Physical    Patient ID: Alicia Wood MRN: [id number], DOB/AGE: 47-07-1970   Admit date: 12/05/2015   Primary Physician: Pcp Not In System Primary Cardiologist: Dr. [name]  Patient Profile    45 yo female with PMH of HTN, HLD, and pericardial effusion who presented to the [facility] ED with reports of chest pain after discharged on 9/7.   Past Medical History    Past Medical History:  Diagnosis Date  . CHF (congestive heart failure) (HCC)   . Hyperlipidemia   . Hypertension   . Pericardial effusion     Past Surgical History:  Procedure Laterality Date  . ABDOMINAL HYSTERECTOMY    . APPENDECTOMY    . PARTIAL HYSTERECTOMY    . TUBAL LIGATION       Allergies  No Known Allergies  History of Present Illness    Alicia Wood is a 45 yo female with PMH of HTN, HLD, and pericardial effusion. She was recently admitted under our service from 8/31 - 9/7 when she presented complaining of progressive shortness of breath and chest pain, and found to have a moderate to large pericardial effusion on CT scan.  During that admission her TSH was checked and normal, CBC at that time did display some anemia but no active bleeding reported and guaiac was negative. She was loaded with colchicine on admission, and set up for possible tap of the effusion if did not improve with medical therapy. Follow-up echo was done showing moderate to large circumferential pericardial effusion but no tamponade on, so her colchicine was increased and ibuprofen was added. Unfortunately she did report continued chest pain so NSAIDs were stopped and she was placed on steroids and a PPI for a brief period time. Her labs were significant for elevated CRP and 11, and sedimentation rate consistent with pericarditis. Steroids were ultimately stopped and naproxen added to her medication regimen. She had a repeat echo on 9/5 showing a decrease in the size of effusion and no tamponade on, but did report ongoing  pain so steroid taper was resumed. On her day of discharge she reported no further chest pain, and was discharged home with steroid taper, aspirin and colchicine was follow-up arrangements in the office.  She reports feeling well after being discharged home, but developed recurrent chest pain on Monday of this week which continued until this morning. States that the pain became more intense, worsening with her breathing, and very similar to the pain she was initially admitted with last hospitalization. She reports being compliant with all of her home medications that she was discharged with. Denies any fever, or cough at home.   On arrival to the ED she had been given 2 SL nitroglycerin by EMS and was noted to be hypertensive, but mentating appropriately. Blood pressure was noted to be in the 70s systolic, showed normal saline bolus was given. A bedside ultrasound was performed by the EDP which showed small to moderate pericardial effusion, but no evidence of cardiac tamponade. EKG showed SR without acute ST/ T wave abnormalities. She was given one dose of fentanyl, but BP remains in the 80 systolic. Chest x-ray showed low lung volumes, and mild patchy bilateral lower lobe opacities likely related to atelectasis. Her labs are significant for normal electrolytes, negative POC troponin, CRP 3.7, and sedimentation rate 31.   Home Medications    Prior to Admission medications   Medication Sig Start Date End Date Taking? Authorizing Provider  aspirin 81 MG chewable tablet Chew  1 tablet (81 mg total) by mouth daily. 11/29/15  Yes Arty Baumgartner, NP  colchicine 0.6 MG tablet Take 1 tablet (0.6 mg total) by mouth 2 (two) times daily. 11/29/15  Yes Arty Baumgartner, NP  naproxen (NAPROSYN) 500 MG tablet Take 1 tablet (500 mg total) by mouth 3 (three) times daily with meals. 11/29/15  Yes Arty Baumgartner, NP  predniSONE (DELTASONE) 20 MG tablet Take 2 tablets (40 mg total) by mouth daily with breakfast. 11/29/15   Yes Arty Baumgartner, NP  HYDROcodone-acetaminophen (NORCO) 5-325 MG tablet Take 1 tablet by mouth every 6 (six) hours as needed for moderate pain. Patient not taking: Reported on 11/22/2015 11/11/15   Elvina Sidle, MD    Family History    No family history on file.  Social History    Social History   Social History  . Marital status: Married    Spouse name: N/A  . Number of children: N/A  . Years of education: N/A   Occupational History  . Not on file.   Social History Main Topics  . Smoking status: Never Smoker  . Smokeless tobacco: Never Used  . Alcohol use Yes     Comment: occasional  . Drug use: No  . Sexual activity: Not on file   Other Topics Concern  . Not on file   Social History Narrative  . No narrative on file     Review of Systems    General:  No chills, fever, night sweats or weight changes.  Cardiovascular:  See HPI Dermatological: No rash, lesions/masses Respiratory: No cough, + dyspnea Urologic: No hematuria, dysuria Abdominal:   No nausea, vomiting, diarrhea, bright red blood per rectum, melena, or hematemesis Neurologic:  No visual changes, wkns, changes in mental status. All other systems reviewed and are otherwise negative except as noted above.  Physical Exam    Blood pressure (!) 87/54, pulse 77, temperature 98.7 F (37.1 C), temperature source Oral, resp. rate 18, height 5\' 8"  (1.727 m), weight 266 lb (120.7 kg), SpO2 97 %.  General:African-American female, well-developed and well-nourished, appears distressed Psych: Normal affect. Neuro: Alert and oriented X 3. Moves all extremities spontaneously. HEENT: Normal   Neck: Supple without bruits or JVD. Lungs:  Resp regular and unlabored, CTA. Heart: RRR no s3, s4, or murmurs. Abdomen: Soft, non-tender, non-distended, BS + x 4.  Extremities: No clubbing, cyanosis or edema. DP/PT/Radials 2+ and equal bilaterally.  Labs    Troponin [facility] Test)  Recent Labs  12/05/15 0951   TROPIPOC 0.00   No results for input(s): CKTOTAL, CKMB, TROPONINI in the last 72 hours. Lab Results  Component Value Date   WBC 11.1 (H) 12/05/2015   HGB 10.2 (L) 12/05/2015   HCT 32.6 (L) 12/05/2015   MCV 90.3 12/05/2015   PLT 395 12/05/2015    Recent Labs Lab 12/05/15 0953 12/05/15 1045  NA  --  139  K  --  3.8  CL  --  105  CO2  --  24  BUN  --  20  CREATININE  --  0.88  CALCIUM  --  8.7*  PROT 6.2*  --   BILITOT 0.4  --   ALKPHOS 74  --   ALT 18  --   AST 14*  --   GLUCOSE  --  110*   No results found for: CHOL, HDL, LDLCALC, TRIG No results found for: [facility]   Radiology Studies    Dg Chest Portable 1  View  Result Date: 12/05/2015 CLINICAL DATA:  Right chest pain, shortness of breath x3 days EXAM: PORTABLE CHEST 1 VIEW COMPARISON:  CT chest dated 11/22/2015 FINDINGS: Low lung volumes. Increased interstitial markings, favoring vascular crowding. Mild patchy bilateral lower lobe opacities, likely atelectasis. Mild cardiomegaly. IMPRESSION: Low lung volumes. Mild patchy bilateral lower lobe opacities, likely atelectasis. Electronically Signed   By: Charline BillsSriyesh  Krishnan M.D.   On: 12/05/2015 10:21    ECG & Cardiac Imaging    EKG: SR without acute ST/ T wave abnormalities  Echo: 11/23/2015  Study Conclusions  - Left ventricle: The cavity size was normal. Wall thickness was   increased in a pattern of mild LVH. Systolic function was normal.   The estimated ejection fraction was in the range of 60% to 65%. - Atrial septum: No defect or patent foramen ovale was identified. - Pericardium, extracardiac: Moderate pericardial effuson more   posterior and lateral   IVC has respiratory variation some RA inversion no definitvie   tamponade.   11/27/2015  Study Conclusions  - Left ventricle: The cavity size was normal. Wall thickness was   normal. Systolic function was normal. The estimated ejection   fraction was in the range of 55% to 60%. - Pericardium,  extracardiac: A small to moderate pericardial   effusion was identified. There was no evidence of hemodynamic   compromise. Features were not consistent with tamponade   physiology.  Assessment & Plan    45 yo female with PMH of HTN, HLD, and pericardial effusion who [facility] with reports of chest pain after discharged on 9/7.  1. Chest pain/Pericardial effusion: Recently discharged home on 9/7 after hospital admission for pericardial effusion and pericarditis. Sent home on colchicine, steriods and naproxen which she reports being compliant with since discharge. Presents back to the ED reporting same pain as previous admission with associated dyspnea with pain with inspiration. Hypotensive on arrival to the ED thought to be related to SL nitro, but minimally responsive to IV fluid bolus. Not tachycardiac, and heart sounds do not appear distant. Bedside echo by EDP showed small to moderate pericardial effusion, CRP and sed rate improved since last admission. EKG with SR, POC trop neg.  -- EDP ordered CT angio with results pending, I have ordered Stat bedside echo to further assess size of effusion and assess for tamponade.    Alicia CoffinSigned, Lindsay Roberts, NP-C Pager [phone] 12/05/2015, 1:19 PM   The patient was seen, examined and discussed with Laverda PageLindsay Roberts, NP-C and I agree with the above.   45 year old female recently discharged for acute pericarditis with prolonged course and pain out of proportion to the findings.  She was discharged on Prednisone, currently on 40 mg po daily, colchicine, and NSAIDS. Pericardial effusion has significantly decreased. She has returned today with  Severe chest pain that is pleuritic. CT PE negative, however low specificity for a small PE sec to body habitus. Echo in the ER shows normal LVEF, mild pericardial effusion, dilated RV with mildly to moderately decreased RV systolic function and D shape of the interventricular septum. Her initial  BP in 80', improved to 2090' with fluid resuscitation.  I will schedule a cardiac MRI to evaluate for constriction and pericarditis. ? Sickle cell crisis? We will admit to hospitalist for evaluation of non-cardiac chest pains.  Tobias AlexanderKatarina Desha Bitner 12/05/2015

## 2015-12-05 NOTE — Consult Note (Signed)
Cardiology Consult    Patient ID: Alicia Wood MRN: [id number], DOB/AGE: [birth year]   Admit date: 12/05/2015   Primary Physician: Pcp Not In System Primary Cardiologist: Dr. [name]  Patient Profile    45 yo female with PMH of HTN, HLD, and pericardial effusion who presented to the [facility] ED with reports of chest pain after discharged on 9/7.   Past Medical History    Past Medical History:  Diagnosis Date  . CHF (congestive heart failure) (HCC)   . Hyperlipidemia   . Hypertension   . Pericardial effusion     Past Surgical History:  Procedure Laterality Date  . ABDOMINAL HYSTERECTOMY    . APPENDECTOMY    . PARTIAL HYSTERECTOMY    . TUBAL LIGATION       Allergies  No Known Allergies  History of Present Illness    Alicia Wood is a 45 yo female with PMH of HTN, HLD, and pericardial effusion. She was recently admitted under our service from 8/31 - 9/7 when she presented complaining of progressive shortness of breath and chest pain, and found to have a moderate to large pericardial effusion on CT scan.  During that admission her TSH was checked and normal, CBC at that time did display some anemia but no active bleeding reported and guaiac was negative. She was loaded with colchicine on admission, and set up for possible tap of the effusion if did not improve with medical therapy. Follow-up echo was done showing moderate to large circumferential pericardial effusion but no tamponade on, so her colchicine was increased and ibuprofen was added. Unfortunately she did report continued chest pain so NSAIDs were stopped and she was placed on steroids and a PPI for a brief period time. Her labs were significant for elevated CRP and 11, and sedimentation rate consistent with pericarditis. Steroids were ultimately stopped and naproxen added to her medication regimen. She had a repeat echo on 9/5 showing a decrease in the size of effusion and no tamponade on, but did report ongoing  pain so steroid taper was resumed. On her day of discharge she reported no further chest pain, and was discharged home with steroid taper, aspirin and colchicine was follow-up arrangements in the office.  She reports feeling well after being discharged home, but developed recurrent chest pain on Monday of this week which continued until this morning. States that the pain became more intense, worsening with her breathing, and very similar to the pain she was initially admitted with last hospitalization. She reports being compliant with all of her home medications that she was discharged with. Denies any fever, or cough at home.   On arrival to the ED she had been given 2 SL nitroglycerin by EMS and was noted to be hypertensive, but mentating appropriately. Blood pressure was noted to be in the 70s systolic, showed normal saline bolus was given. A bedside ultrasound was performed by the EDP which showed small to moderate pericardial effusion, but no evidence of cardiac tamponade. EKG showed SR without acute ST/ T wave abnormalities. She was given one dose of fentanyl, but BP remains in the 80 systolic. Chest x-ray showed low lung volumes, and mild patchy bilateral lower lobe opacities likely related to atelectasis. Her labs are significant for normal electrolytes, negative POC troponin, CRP 3.7, and sedimentation rate 31.   Home Medications    Prior to Admission medications   Medication Sig Start Date End Date Taking? Authorizing Provider  aspirin 81 MG chewable tablet Chew 1  tablet (81 mg total) by mouth daily. 11/29/15  Yes Arty BaumgartnerLindsay B Ollen Rao, NP  colchicine 0.6 MG tablet Take 1 tablet (0.6 mg total) by mouth 2 (two) times daily. 11/29/15  Yes Arty BaumgartnerLindsay B Alaa Eyerman, NP  naproxen (NAPROSYN) 500 MG tablet Take 1 tablet (500 mg total) by mouth 3 (three) times daily with meals. 11/29/15  Yes Arty BaumgartnerLindsay B Leva Baine, NP  predniSONE (DELTASONE) 20 MG tablet Take 2 tablets (40 mg total) by mouth daily with breakfast. 11/29/15   Yes Arty BaumgartnerLindsay B Arihant Pennings, NP  HYDROcodone-acetaminophen (NORCO) 5-325 MG tablet Take 1 tablet by mouth every 6 (six) hours as needed for moderate pain. Patient not taking: Reported on 11/22/2015 11/11/15   Elvina SidleKurt Lauenstein, MD    Family History    No family history on file.  Social History    Social History   Social History  . Marital status: Married    Spouse name: N/A  . Number of children: N/A  . Years of education: N/A   Occupational History  . Not on file.   Social History Main Topics  . Smoking status: Never Smoker  . Smokeless tobacco: Never Used  . Alcohol use Yes     Comment: occasional  . Drug use: No  . Sexual activity: Not on file   Other Topics Concern  . Not on file   Social History Narrative  . No narrative on file     Review of Systems    General:  No chills, fever, night sweats or weight changes.  Cardiovascular:  See HPI Dermatological: No rash, lesions/masses Respiratory: No cough, + dyspnea Urologic: No hematuria, dysuria Abdominal:   No nausea, vomiting, diarrhea, bright red blood per rectum, melena, or hematemesis Neurologic:  No visual changes, wkns, changes in mental status. All other systems reviewed and are otherwise negative except as noted above.  Physical Exam    Blood pressure 91/61, pulse 88, temperature 98.7 F (37.1 C), temperature source Oral, resp. rate 23, height 5\' 8"  (1.727 m), weight 266 lb (120.7 kg), SpO2 97 %.  General:African-American female, well-developed and well-nourished, appears distressed Psych: Normal affect. Neuro: Alert and oriented X 3. Moves all extremities spontaneously. HEENT: Normal   Neck: Supple without bruits or JVD. Lungs:  Resp regular and unlabored, CTA. Heart: RRR no s3, s4, or murmurs. Abdomen: Soft, non-tender, non-distended, BS + x 4.  Extremities: No clubbing, cyanosis or edema. DP/PT/Radials 2+ and equal bilaterally.  Labs    Troponin [facility](Point of Care Test)  Recent Labs  12/05/15 0951    TROPIPOC 0.00   No results for input(s): CKTOTAL, CKMB, TROPONINI in the last 72 hours. Lab Results  Component Value Date   WBC 11.1 (H) 12/05/2015   HGB 10.2 (L) 12/05/2015   HCT 32.6 (L) 12/05/2015   MCV 90.3 12/05/2015   PLT 395 12/05/2015     Recent Labs Lab 12/05/15 0953 12/05/15 1045  NA  --  139  K  --  3.8  CL  --  105  CO2  --  24  BUN  --  20  CREATININE  --  0.88  CALCIUM  --  8.7*  PROT 6.2*  --   BILITOT 0.4  --   ALKPHOS 74  --   ALT 18  --   AST 14*  --   GLUCOSE  --  110*   No results found for: CHOL, HDL, LDLCALC, TRIG No results found for: [facility]   Radiology Studies    Dg Chest Portable 1  View  Result Date: 12/05/2015 CLINICAL DATA:  Right chest pain, shortness of breath x3 days EXAM: PORTABLE CHEST 1 VIEW COMPARISON:  CT chest dated 11/22/2015 FINDINGS: Low lung volumes. Increased interstitial markings, favoring vascular crowding. Mild patchy bilateral lower lobe opacities, likely atelectasis. Mild cardiomegaly. IMPRESSION: Low lung volumes. Mild patchy bilateral lower lobe opacities, likely atelectasis. Electronically Signed   By: Charline Bills M.D.   On: 12/05/2015 10:21    ECG & Cardiac Imaging    EKG: SR without acute ST/ T wave abnormalities  Echo: 11/23/2015  Study Conclusions  - Left ventricle: The cavity size was normal. Wall thickness was   increased in a pattern of mild LVH. Systolic function was normal.   The estimated ejection fraction was in the range of 60% to 65%. - Atrial septum: No defect or patent foramen ovale was identified. - Pericardium, extracardiac: Moderate pericardial effuson more   posterior and lateral   IVC has respiratory variation some RA inversion no definitvie   tamponade.   11/27/2015  Study Conclusions  - Left ventricle: The cavity size was normal. Wall thickness was   normal. Systolic function was normal. The estimated ejection   fraction was in the range of 55% to 60%. - Pericardium,  extracardiac: A small to moderate pericardial   effusion was identified. There was no evidence of hemodynamic   compromise. Features were not consistent with tamponade   physiology.  Assessment & Plan    45 yo female with PMH of HTN, HLD, and pericardial effusion who presented to the [facility] ED with reports of chest pain after discharged on 9/7.  1. Chest pain/Pericardial effusion: Recently discharged home on 9/7 after hospital admission for pericardial effusion and pericarditis. Sent home on colchicine, steriods and naproxen which she reports being compliant with since discharge. Presents back to the ED reporting same pain as previous admission with associated dyspnea with pain with inspiration. Hypotensive on arrival to the ED thought to be related to SL nitro, but minimally responsive to IV fluid bolus. Not tachycardiac, and heart sounds do not appear distant. Bedside echo by EDP showed small to moderate pericardial effusion, CRP and sed rate improved since last admission. EKG with SR, POC trop neg.  -- EDP ordered CT angio with results pending, I have ordered Stat bedside echo to further assess size of effusion and assess for tamponade.    Janice Coffin, NP-C Pager [phone] 12/05/2015, 3:15 PM   The patient was seen, examined and discussed with Laverda Page, NP-C and I agree with the above.   45 year old female recently discharged for acute pericarditis with prolonged course and pain out of proportion to the findings.  She was discharged on Prednisone, currently on 40 mg po daily, colchicine, and NSAIDS. Pericardial effusion has significantly decreased. She has returned today with  Severe chest pain that is pleuritic. CT PE negative, however low specificity for a small PE sec to body habitus. Echo in the ER shows normal LVEF, mild pericardial effusion, dilated RV with mildly to moderately decreased RV systolic function and D shape of the interventricular septum. Her initial  BP in 80', improved to 93' with fluid resuscitation.  I will schedule a cardiac MRI to evaluate for constriction and pericarditis. ? Sickle cell crisis? We will admit to hospitalist for evaluation of non-cardiac chest pains.  Tobias Alexander 12/05/2015

## 2015-12-05 NOTE — ED Notes (Signed)
Pt denies any change in pain level s/p 50 mcg fentanyl but verbal moaning and rapid respiration rate has stopped.

## 2015-12-05 NOTE — ED Provider Notes (Signed)
[facility]-EMERGENCY DEPT Provider Note   CSN: [id number] Arrival date & time: 12/05/15  8295     History   Chief Complaint Chief Complaint  Patient presents with  . Chest Pain    HPI Alicia Wood is a 45 y.o. female.  45 year old female with history of hypertension, hyperlipidemia, and recent pericardial effusion who presents with chest pain. Patient called EMS this morning for 3 days of constant, central chest pain that became more severe this morning. The pain is worse with any movement, laying flat, or breathing and. This is the same pain that she had while hospitalized for her pericardial effusion. She states she has been compliant with all of her medications. She denies any fevers. She endorses some nausea and vomiting. She has had some diarrhea since discharge from the hospital, approximately 1-2 episodes per day.   The history is provided by the patient.    Past Medical History:  Diagnosis Date  . CHF (congestive heart failure) (HCC)   . Hyperlipidemia   . Hypertension   . Pericardial effusion     Patient Active Problem List   Diagnosis Date Noted  . Pericardial effusion 11/22/2015    Past Surgical History:  Procedure Laterality Date  . ABDOMINAL HYSTERECTOMY    . APPENDECTOMY    . PARTIAL HYSTERECTOMY    . TUBAL LIGATION      OB History    No data available       Home Medications    Prior to Admission medications   Medication Sig Start Date End Date Taking? Authorizing Provider  aspirin 81 MG chewable tablet Chew 1 tablet (81 mg total) by mouth daily. 11/29/15  Yes Arty Baumgartner, NP  colchicine 0.6 MG tablet Take 1 tablet (0.6 mg total) by mouth 2 (two) times daily. 11/29/15  Yes Arty Baumgartner, NP  naproxen (NAPROSYN) 500 MG tablet Take 1 tablet (500 mg total) by mouth 3 (three) times daily with meals. 11/29/15  Yes Arty Baumgartner, NP  predniSONE (DELTASONE) 20 MG tablet Take 2 tablets (40 mg total) by mouth daily with breakfast. 11/29/15  Yes  Arty Baumgartner, NP  HYDROcodone-acetaminophen (NORCO) 5-325 MG tablet Take 1 tablet by mouth every 6 (six) hours as needed for moderate pain. Patient not taking: Reported on 11/22/2015 11/11/15   Elvina Sidle, MD    Family History No family history on file.  Social History Social History  Substance Use Topics  . Smoking status: Never Smoker  . Smokeless tobacco: Never Used  . Alcohol use Yes     Comment: occasional     Allergies   Review of patient's allergies indicates no known allergies.   Review of Systems Review of Systems 10 Systems reviewed and are negative for acute change except as noted in the HPI.   Physical Exam Updated Vital Signs BP 91/61   Pulse 88   Temp 98.7 F (37.1 C) (Oral)   Resp 23   Ht 5\' 8"  (1.727 m)   Wt 266 lb (120.7 kg)   SpO2 97%   BMI 40.45 kg/m   Physical Exam  Constitutional: She is oriented to person, place, and time. She appears well-developed and well-nourished. She appears distressed.  Moaning, sitting straight up in bed  HENT:  Head: Normocephalic and atraumatic.  Moist mucous membranes  Eyes: Conjunctivae are normal. Pupils are equal, round, and reactive to light.  Neck: Neck supple.  Cardiovascular: Normal rate, regular rhythm and normal heart sounds.   No murmur heard. Quiet  heart sounds  Pulmonary/Chest: Breath sounds normal.  Tachypnea 2/2 pain  Abdominal: Soft. Bowel sounds are normal. She exhibits no distension. There is no tenderness.  Musculoskeletal: She exhibits no edema.  Neurological: She is alert and oriented to person, place, and time.  Fluent speech  Skin: Skin is warm and dry.  Psychiatric:  Distressed, anxious  Nursing note and vitals reviewed.    ED Treatments / Results  Labs (all labs ordered are listed, but only abnormal results are displayed) Labs Reviewed  BASIC METABOLIC PANEL - Abnormal; Notable for the following:       Result Value   Glucose, Bld 110 (*)    Calcium 8.7 (*)    All  other components within normal limits  CBC - Abnormal; Notable for the following:    WBC 11.1 (*)    RBC 3.61 (*)    Hemoglobin 10.2 (*)    HCT 32.6 (*)    All other components within normal limits  SEDIMENTATION RATE - Abnormal; Notable for the following:    Sed Rate 31 (*)    All other components within normal limits  C-REACTIVE PROTEIN - Abnormal; Notable for the following:    CRP 3.7 (*)    All other components within normal limits  HEPATIC FUNCTION PANEL - Abnormal; Notable for the following:    Total Protein 6.2 (*)    Albumin 3.0 (*)    AST 14 (*)    Bilirubin, Direct <0.1 (*)    All other components within normal limits  LIPASE, BLOOD  I-STAT TROPOININ, ED  I-STAT TROPOININ, ED    EKG  EKG Interpretation  Date/Time:  Wednesday December 05 2015 09:41:09 EDT Ventricular Rate:  63 PR Interval:    QRS Duration: 83 QT Interval:  416 QTC Calculation: 426 R Axis:   14 Text Interpretation:  Sinus rhythm Left atrial enlargement Low voltage, precordial leads Abnormal R-wave progression, early transition persistent low voltages, no evidence of electrical alternans Confirmed by Madisyn Mawhinney MD, Atianna Haidar ([id number]) on 12/05/2015 9:51:30 AM       Radiology Ct Angio Chest Pe W/cm &/or Wo Cm  Result Date: 12/05/2015 CLINICAL DATA:  Chest pain and shortness of Breath starting this morning. Recent hospitalization for pericardial effusion. EXAM: CT ANGIOGRAPHY CHEST WITH CONTRAST TECHNIQUE: Multidetector CT imaging of the chest was performed using the standard protocol during bolus administration of intravenous contrast. Multiplanar CT image reconstructions and MIPs were obtained to evaluate the vascular anatomy. CONTRAST:  80 cc Isovue 370 COMPARISON:  12/05/2015 and 11/22/2015 FINDINGS: Body habitus and motion artifact reduces exam sensitivity and specificity. Cardiovascular: Mildly dilute contrast bolus with mild heterogeneity of the pulmonary arterial contrast. Reduced sensitivity for  subsegmental emboli. No filling defect is identified in the pulmonary arterial tree to suggest pulmonary embolus. No acute aortic findings. Mild cardiomegaly and moderate pericardial effusion, reduced from the large pericardial effusion that was present on 11/22/2015. Mediastinum/Nodes: Is no pathologic adenopathy. Lungs/Pleura: Small bilateral pleural effusions. Mild dependent atelectasis in both lower lobes. Upper Abdomen: 1.3 cm hypodense lesion in the upper pole the left kidney, previously approximately 1.0 cm on 05/29/2009, nonspecific. Musculoskeletal: Thoracic spondylosis. Review of the MIP images confirms the above findings. IMPRESSION: 1. No embolus identified. Reduced sensitivity is specially for smaller emboli due to body habitus, motion artifact, and somewhat heterogeneous contrast bolus in the pulmonary arteries. 2. Moderate pericardial effusion, but reduced from the prominent pericardial effusion that was present on 11/22/2015. 3. Mild cardiomegaly. 4. Small bilateral pleural effusions with dependent  atelectasis in both lower lobes. 5. 1.3 cm nonspecific hypodense lesion in the upper pole the left kidney. Back in 2011 this measured about 1.0 cm, accordingly this is most likely benign, but technically nonspecific. Electronically Signed   By: Gaylyn Rong M.D.   On: 12/05/2015 14:03   Dg Chest Portable 1 View  Result Date: 12/05/2015 CLINICAL DATA:  Right chest pain, shortness of breath x3 days EXAM: PORTABLE CHEST 1 VIEW COMPARISON:  CT chest dated 11/22/2015 FINDINGS: Low lung volumes. Increased interstitial markings, favoring vascular crowding. Mild patchy bilateral lower lobe opacities, likely atelectasis. Mild cardiomegaly. IMPRESSION: Low lung volumes. Mild patchy bilateral lower lobe opacities, likely atelectasis. Electronically Signed   By: Charline Bills M.D.   On: 12/05/2015 10:21    Procedures .Critical Care Performed by: Laurence Spates Authorized by: Laurence Spates   Critical care provider statement:    Critical care time (minutes):  45   Critical care time was exclusive of:  Separately billable procedures and treating other patients   Critical care was necessary to treat or prevent imminent or life-threatening deterioration of the following conditions: hypotension.   Critical care was time spent personally by me on the following activities:  Development of treatment plan with patient or surrogate, evaluation of patient's response to treatment, examination of patient, obtaining history from patient or surrogate, ordering and performing treatments and interventions, ordering and review of laboratory studies, ordering and review of radiographic studies, re-evaluation of patient's condition and review of old charts   (including critical care time)    EMERGENCY DEPARTMENT [place] CARDIAC EXAM "Study: Limited Ultrasound of the heart and pericardium"  INDICATIONS:Hypotension Multiple views of the heart and pericardium were obtained in real-time with a multi-frequency probe.  PERFORMED ZO:XWRUEA  IMAGES ARCHIVED?: Yes  FINDINGS: Moderate effusion  LIMITATIONS:  Body habitus And inability to lay patient supine  VIEWS USED: Parasternal long axis, Parasternal short axis and Apical 4 chamber   INTERPRETATION: Cardiac tamponade absent  Medications Ordered in ED Medications  0.9 %  sodium chloride infusion ( Intravenous New Bag/Given 12/05/15 1130)  sodium chloride 0.9 % bolus 1,000 mL (0 mLs Intravenous Stopped 12/05/15 1137)  fentaNYL (SUBLIMAZE) injection 50 mcg (50 mcg Intravenous Given 12/05/15 1134)  iopamidol (ISOVUE-370) 76 % injection (80 mLs  Contrast Given 12/05/15 1322)     Initial Impression / Assessment and Plan / ED Course  I have reviewed the triage vital signs and the nursing notes.  Pertinent labs & imaging results that were available during my care of the patient were reviewed by me and considered in my medical decision making (see  chart for details).  Clinical Course   Pt with recent hospitalization for pericardial effusion presents with 3 days of chest pain that was worse this morning. She was brought in by EMS and was in distress due to pain, sitting straight up in bed. She was hypotensive but had received nitroglycerin 2 in addition to aspirin by EMS. Mentating appropriately. Started a fluid bolus and performed bedside ultrasound which showed small to moderate pericardial effusion, no large effusion or evidence of cardiac tamponade. EKG shows low voltages but no electrical alternans. Chest x-ray with mild cardiomegaly, no acute findings. Initial troponin negative.  I contacted cardiology for evaluation given the patient's persistent hypotension despite fluid boluses for her nitroglycerin to metabolize. Eventually I obtained a CTA to rule out PE, which was negative for PE and showed persistent paracardial effusion that is improved from previous imaging. Her inflammatory  markers are also improved from previous. The patient will be admitted to the cardiology service for further workup. Final Clinical Impressions(s) / ED Diagnoses   Final diagnoses:  Chest pain    New Prescriptions New Prescriptions   No medications on file     Laurence Spatesachel Morgan Jamaul Heist, MD 12/05/15 1525

## 2015-12-05 NOTE — ED Notes (Signed)
Lillia AbedLindsay, NP-Cardiology at bedside

## 2015-12-05 NOTE — ED Notes (Signed)
Little, MD present in room

## 2015-12-05 NOTE — Progress Notes (Signed)
  Echocardiogram 2D Echocardiogram has been performed.  Leta JunglingCooper, Jaykob Minichiello M 12/05/2015, 2:18 PM

## 2015-12-05 NOTE — ED Notes (Signed)
Ms,WILLIAMS  BOYFRIEND ARRIVED IN CHECKIN LOOKING FOR FOR HER .SHE WASNOT BOARDED IN A ROOM YET ,HER NAME POP UP IN A-3 WHEN EMS ARRIVED TO THE ROOM I WALKED TO THE ROOM .ASK THE PT.HER FULL NAME GAVE HER POCKET BOOK.  HER  BOYFRIEND LEFT HE HAD A DOCTORS APPT.

## 2015-12-05 NOTE — H&P (Addendum)
History and Physical    Alicia Wood HDQ:[id number] DOB: 1971-03-15 DOA: 12/05/2015  PCP: Pcp Not In System  Patient coming from: Home  Chief Complaint: Chest pain worsen since 2-3 days.  HPI: Alicia Wood is a 45 y.o. female with medical history significant of hypertension, dyslipidemia, morbid obesity, who was recently admitted in cardiology service for chest pain associated with pericardial effusion and pericarditis presented with chest pain worsened for last 2-3 days. Patient was recently admitted from 8/31 to 11/29/15 when she was presented with the complaint of progressive shortness of breath and chest pain. Patient was found to have moderate to large pericardial effusion. Patient was treated with colchicine, and sedated and prednisone and discharged home. Patient reported that her pain was not completely improved after discharge from the hospital. The chest pain started worsening since last Monday, 2-3 days prior to admission. The chest pain is mostly central. She feels like pressure and sometimes radiated to her right arm. The pain increases with deep breathing. The pain is associated with shortness of breath. Patient reported taking her medications at home as prescribed. Patient reported chills and nausea earlier today. Denied coughing, vomiting him a headache, dizziness, abdominal pain. No recent travel or sick contact. Patient's daughter was present at the bedside in the Er.  Denied smoking. No significant cardiac family history, family history reviewed at the bedside. Patient works  in Water engineer at [employer].  ED Course: Patient was treated by 2 sublingual nitroglycerin by EMS on arrival to the ER. On arrival to ER as he was hypotensive with lowest blood pressure 60s. As per medical record, patient was mentating appropriately during hypotensive episode as well. The patient received 2 L of IV fluid with improvement in blood pressure, systolic blood pressure around  90s. A bedside ultrasound was performed in the ER which showed a small-to-moderate pericardial effusion. No sign of cardiac tamponade. Troponin not elevated. EKG without any ischemic changes. Patient was already evaluated by cardiologist and recommended admission under hospitalist service. Cardiologist ordered MRI of the heart. Patient has elevated ESR and CRP.  Review of Systems: As per HPI otherwise 10 point review of systems negative.  Patient denied headache, dizziness, vomiting, cough, abdominal pain. Patient had chest pain, nausea and dry cough. Also reported intermittent chills.  Past Medical History:  Diagnosis Date  . CHF (congestive heart failure) ([place])   . Hyperlipidemia   . Hypertension   . Pericardial effusion     Past Surgical History:  Procedure Laterality Date  . ABDOMINAL HYSTERECTOMY    . APPENDECTOMY    . PARTIAL HYSTERECTOMY    . TUBAL LIGATION      Social History: reports that she has never smoked. She has never used smokeless tobacco. She reports that she drinks alcohol. She reports that she does not use drugs.  No Known Allergies  Family history: reviewed with the patient. No pertinent family history present.   Prior to Admission medications   Medication Sig Start Date End Date Taking? Authorizing Provider  aspirin 81 MG chewable tablet Chew 1 tablet (81 mg total) by mouth daily. 11/29/15  Yes Cheryln Manly, NP  colchicine 0.6 MG tablet Take 1 tablet (0.6 mg total) by mouth 2 (two) times daily. 11/29/15  Yes Cheryln Manly, NP  naproxen (NAPROSYN) 500 MG tablet Take 1 tablet (500 mg total) by mouth 3 (three) times daily with meals. 11/29/15  Yes Cheryln Manly, NP  predniSONE (DELTASONE) 20 MG tablet Take 2 tablets (40  mg total) by mouth daily with breakfast. 11/29/15  Yes Cheryln Manly, NP  HYDROcodone-acetaminophen (NORCO) 5-325 MG tablet Take 1 tablet by mouth every 6 (six) hours as needed for moderate pain. Patient not taking: Reported on 11/22/2015  11/11/15   Robyn Haber, MD    Physical Exam: Vitals:   12/05/15 1530 12/05/15 1600 12/05/15 1606 12/05/15 1700  BP: 90/68 (!) 87/65 (!) 87/65 90/70  Pulse:   99 86  Resp: (!) 35 (!) 39 (!) 29 21  Temp:      TempSrc:      SpO2:   95% 98%  Weight:      Height:          Constitutional: NAD, calm, comfortable Vitals:   12/05/15 1530 12/05/15 1600 12/05/15 1606 12/05/15 1700  BP: 90/68 (!) 87/65 (!) 87/65 90/70  Pulse:   99 86  Resp: (!) 35 (!) 39 (!) 29 21  Temp:      TempSrc:      SpO2:   95% 98%  Weight:      Height:       Gen: moaning with chest pain.  Eyes: PERRL, lids and conjunctivae normal ENMT: dry oral mucosa  Neck: normal, supple, no masses, no thyromegaly Respiratory: clear to auscultation bilaterally, no wheezing, no crackles.  Cardiovascular: Regular rate and rhythm, no murmurs or rub appreciated. No extremity edema. 2+ pedal pulses. No carotid bruits.  -chest wall tenderness+, reproducible pain+ Abdomen: soft,not tenderness,. Bowel sounds positive.  Skin: no rashes, lesions, ulcers. No induration Neurologic: CN 2-12 grossly intact. Sensation intact, DTR normal. Strength 5/5 in all 4.  Psychiatric:  Alert and oriented x 3.    Labs on Admission: I have personally reviewed following labs and imaging studies  CBC:  Recent Labs Lab 12/05/15 1045  WBC 11.1*  HGB 10.2*  HCT 32.6*  MCV 90.3  PLT 202   Basic Metabolic Panel:  Recent Labs Lab 12/05/15 1045  NA 139  K 3.8  CL 105  CO2 24  GLUCOSE 110*  BUN 20  CREATININE 0.88  CALCIUM 8.7*   GFR: Estimated Creatinine Clearance: 108 mL/min (by C-G formula based on SCr of 0.88 mg/dL). Liver Function Tests:  Recent Labs Lab 12/05/15 0953  AST 14*  ALT 18  ALKPHOS 74  BILITOT 0.4  PROT 6.2*  ALBUMIN 3.0*    Recent Labs Lab 12/05/15 0953  LIPASE 19   No results for input(s): AMMONIA in the last 168 hours. Coagulation Profile: No results for input(s): INR, PROTIME in the last 168  hours. Cardiac Enzymes: No results for input(s): CKTOTAL, CKMB, CKMBINDEX, TROPONINI in the last 168 hours. BNP (last 3 results) No results for input(s): PROBNP in the last 8760 hours. HbA1C: No results for input(s): HGBA1C in the last 72 hours. CBG: No results for input(s): GLUCAP in the last 168 hours. Lipid Profile: No results for input(s): CHOL, HDL, LDLCALC, TRIG, CHOLHDL, LDLDIRECT in the last 72 hours. Thyroid Function Tests: No results for input(s): TSH, T4TOTAL, FREET4, T3FREE, THYROIDAB in the last 72 hours. Anemia Panel: No results for input(s): VITAMINB12, FOLATE, FERRITIN, TIBC, IRON, RETICCTPCT in the last 72 hours. Urine analysis:    Component Value Date/Time   COLORURINE AMBER (A) 08/20/2015 2155   APPEARANCEUR CLOUDY (A) 08/20/2015 2155   LABSPEC 1.025 11/11/2015 1723   PHURINE 6.0 11/11/2015 1723   GLUCOSEU NEGATIVE 11/11/2015 1723   HGBUR NEGATIVE 11/11/2015 1723   BILIRUBINUR NEGATIVE 11/11/2015 1723   [place] 11/11/2015 1723  PROTEINUR NEGATIVE 11/11/2015 1723   UROBILINOGEN 2.0 (H) 11/11/2015 1723   NITRITE NEGATIVE 11/11/2015 1723   LEUKOCYTESUR NEGATIVE 11/11/2015 1723   Sepsis Labs: !!!!!!!!!!!!!!!!!!!!!!!!!!!!!!!!!!!!!!!!!!!! @LABRCNTIP (procalcitonin:4,lacticidven:4) )No results found for this or any previous visit (from the past 240 hour(s)).   Radiological Exams on Admission: Ct Angio Chest Pe W/cm &/or Wo Cm  Result Date: 12/05/2015 CLINICAL DATA:  Chest pain and shortness of Breath starting this morning. Recent hospitalization for pericardial effusion. EXAM: CT ANGIOGRAPHY CHEST WITH CONTRAST TECHNIQUE: Multidetector CT imaging of the chest was performed using the standard protocol during bolus administration of intravenous contrast. Multiplanar CT image reconstructions and MIPs were obtained to evaluate the vascular anatomy. CONTRAST:  80 cc Isovue 370 COMPARISON:  12/05/2015 and 11/22/2015 FINDINGS: Body habitus and motion artifact  reduces exam sensitivity and specificity. Cardiovascular: Mildly dilute contrast bolus with mild heterogeneity of the pulmonary arterial contrast. Reduced sensitivity for subsegmental emboli. No filling defect is identified in the pulmonary arterial tree to suggest pulmonary embolus. No acute aortic findings. Mild cardiomegaly and moderate pericardial effusion, reduced from the large pericardial effusion that was present on 11/22/2015. Mediastinum/Nodes: Is no pathologic adenopathy. Lungs/Pleura: Small bilateral pleural effusions. Mild dependent atelectasis in both lower lobes. Upper Abdomen: 1.3 cm hypodense lesion in the upper pole the left kidney, previously approximately 1.0 cm on 05/29/2009, nonspecific. Musculoskeletal: Thoracic spondylosis. Review of the MIP images confirms the above findings. IMPRESSION: 1. No embolus identified. Reduced sensitivity is specially for smaller emboli due to body habitus, motion artifact, and somewhat heterogeneous contrast bolus in the pulmonary arteries. 2. Moderate pericardial effusion, but reduced from the prominent pericardial effusion that was present on 11/22/2015. 3. Mild cardiomegaly. 4. Small bilateral pleural effusions with dependent atelectasis in both lower lobes. 5. 1.3 cm nonspecific hypodense lesion in the upper pole the left kidney. Back in 2011 this measured about 1.0 cm, accordingly this is most likely benign, but technically nonspecific. Electronically Signed   By: Van Clines M.D.   On: 12/05/2015 14:03   Dg Chest Portable 1 View  Result Date: 12/05/2015 CLINICAL DATA:  Right chest pain, shortness of breath x3 days EXAM: PORTABLE CHEST 1 VIEW COMPARISON:  CT chest dated 11/22/2015 FINDINGS: Low lung volumes. Increased interstitial markings, favoring vascular crowding. Mild patchy bilateral lower lobe opacities, likely atelectasis. Mild cardiomegaly. IMPRESSION: Low lung volumes. Mild patchy bilateral lower lobe opacities, likely atelectasis.  Electronically Signed   By: Julian Hy M.D.   On: 12/05/2015 10:21    EKG: Independently reviewed. Sinus rhythm.  Assessment/Plan Active Problems:   Pericardial effusion   Chest pain   Hypotension   # Chest pain: Likely pericarditis vs costochondritis ( the pain is reproducible).  - EKG showed no ischemic changes, troponin negative.  -Echo showed pericardial effusion with no tamponade. The patient was already evaluated by cardiologist. Cardiologist planned to cardiac MRI to evaluated for constriction pericarditis. -CT angiogram negative for pulmonary embolism. No sign of pneumonia. -I will admit patient in the stepdown, continue telemeter, repeat EKG, serial troponin. -Continue aspirin, colchicine and prednisone. -I will add Protonix. Given current clinical symptoms and physical examination, acid reflux is  less likely. -Follow-up cardiology evaluation. -Patient denied any history or family history of sickle cell disease. No rash on the skin. -Pain medications ordered.  #Hypotension: Likely contributed by sublingual nitroglycerin versus constrictive pericarditis. Follow-up cardiac MRI. Patient's blood pressure improved to systolic 90W with 2 L of normal saline in the ER. Continue normal saline tonight. Monitor blood pressure closely.  #Pericardial  effusion: Continue current treatment as above. Cardiology is following.  Discussed with patient and her daughter about the plan of care. Also discussed with ER physician.   DVT prophylaxis: Lovenox sq. Code Status: Full Family Communication: Daughter at bedside in Er. Disposition Plan: Likely discharge home in 1-2 days.  Consults called: Cardiology was already called by ER. Admission status: Obs.   Dron Tanna Furry MD [facility] Pager [phone]  If 7PM-7AM, please contact night-coverage [email or web address] Password [email or web address]  12/05/2015, 6:01 PM

## 2015-12-05 NOTE — ED Notes (Signed)
Paged NP to MD Little

## 2015-12-05 NOTE — ED Notes (Signed)
Pt states that she is having trouble breathing. Pt states that it hurts to breathe.

## 2015-12-06 ENCOUNTER — Observation Stay (HOSPITAL_COMMUNITY): Payer: 59

## 2015-12-06 DIAGNOSIS — I309 Acute pericarditis, unspecified: Secondary | ICD-10-CM | POA: Diagnosis not present

## 2015-12-06 DIAGNOSIS — Z791 Long term (current) use of non-steroidal anti-inflammatories (NSAID): Secondary | ICD-10-CM | POA: Diagnosis not present

## 2015-12-06 DIAGNOSIS — I5032 Chronic diastolic (congestive) heart failure: Secondary | ICD-10-CM | POA: Diagnosis not present

## 2015-12-06 DIAGNOSIS — I110 Hypertensive heart disease with heart failure: Secondary | ICD-10-CM | POA: Diagnosis present

## 2015-12-06 DIAGNOSIS — R0781 Pleurodynia: Secondary | ICD-10-CM | POA: Diagnosis not present

## 2015-12-06 DIAGNOSIS — Z7952 Long term (current) use of systemic steroids: Secondary | ICD-10-CM | POA: Diagnosis not present

## 2015-12-06 DIAGNOSIS — I311 Chronic constrictive pericarditis: Secondary | ICD-10-CM | POA: Diagnosis not present

## 2015-12-06 DIAGNOSIS — T380X5A Adverse effect of glucocorticoids and synthetic analogues, initial encounter: Secondary | ICD-10-CM | POA: Diagnosis present

## 2015-12-06 DIAGNOSIS — Z7982 Long term (current) use of aspirin: Secondary | ICD-10-CM | POA: Diagnosis not present

## 2015-12-06 DIAGNOSIS — I9589 Other hypotension: Secondary | ICD-10-CM

## 2015-12-06 DIAGNOSIS — I950 Idiopathic hypotension: Secondary | ICD-10-CM

## 2015-12-06 DIAGNOSIS — I272 Other secondary pulmonary hypertension: Secondary | ICD-10-CM

## 2015-12-06 DIAGNOSIS — I951 Orthostatic hypotension: Secondary | ICD-10-CM | POA: Diagnosis not present

## 2015-12-06 DIAGNOSIS — Z79899 Other long term (current) drug therapy: Secondary | ICD-10-CM | POA: Diagnosis not present

## 2015-12-06 DIAGNOSIS — Z6841 Body Mass Index (BMI) 40.0 and over, adult: Secondary | ICD-10-CM | POA: Diagnosis not present

## 2015-12-06 DIAGNOSIS — I319 Disease of pericardium, unspecified: Secondary | ICD-10-CM

## 2015-12-06 DIAGNOSIS — D72829 Elevated white blood cell count, unspecified: Secondary | ICD-10-CM | POA: Diagnosis present

## 2015-12-06 DIAGNOSIS — R739 Hyperglycemia, unspecified: Secondary | ICD-10-CM | POA: Diagnosis present

## 2015-12-06 DIAGNOSIS — E785 Hyperlipidemia, unspecified: Secondary | ICD-10-CM | POA: Diagnosis present

## 2015-12-06 DIAGNOSIS — I959 Hypotension, unspecified: Secondary | ICD-10-CM | POA: Diagnosis not present

## 2015-12-06 DIAGNOSIS — K5900 Constipation, unspecified: Secondary | ICD-10-CM | POA: Diagnosis not present

## 2015-12-06 DIAGNOSIS — R079 Chest pain, unspecified: Secondary | ICD-10-CM | POA: Diagnosis not present

## 2015-12-06 LAB — BASIC METABOLIC PANEL
Anion gap: 7 (ref 5–15)
BUN: 12 mg/dL (ref 6–20)
CO2: 27 mmol/L (ref 22–32)
Calcium: 8.9 mg/dL (ref 8.9–10.3)
Chloride: 104 mmol/L (ref 101–111)
Creatinine, Ser: 0.83 mg/dL (ref 0.44–1.00)
GFR calc Af Amer: >60 mL/min (ref >60)
GFR calc non Af Amer: >60 mL/min (ref >60)
Glucose, Bld: 114 mg/dL — ABNORMAL HIGH (ref 65–99)
Potassium: 3.8 mmol/L (ref 3.5–5.1)
Sodium: 138 mmol/L (ref 135–145)

## 2015-12-06 LAB — CBC
HCT: 32.8 % — ABNORMAL LOW (ref 36.0–46.0)
Hemoglobin: 10.2 g/dL — ABNORMAL LOW (ref 12.0–15.0)
MCH: 27.9 pg (ref 26.0–34.0)
MCHC: 31.1 g/dL (ref 30.0–36.0)
MCV: 89.6 fL (ref 78.0–100.0)
Platelets: 459 10*3/uL — ABNORMAL HIGH (ref 150–400)
RBC: 3.66 MIL/uL — ABNORMAL LOW (ref 3.87–5.11)
RDW: 14.8 % (ref 11.5–15.5)
WBC: 12.8 10*3/uL — ABNORMAL HIGH (ref 4.0–10.5)

## 2015-12-06 LAB — TROPONIN I: Troponin I: <0.03 ng/mL (ref <0.03)

## 2015-12-06 LAB — MAGNESIUM: Magnesium: 1.7 mg/dL (ref 1.7–2.4)

## 2015-12-06 MED ORDER — PREDNISONE 50 MG PO TABS: 60.0000 mg | ORAL_TABLET | Freq: Once | ORAL | Status: DC

## 2015-12-06 MED ORDER — IBUPROFEN 600 MG PO TABS
600.0000 mg | ORAL_TABLET | Freq: Three times a day (TID) | ORAL | Status: DC
  Administered 2015-12-06 – 2015-12-10 (×12): 600 mg via ORAL
  Filled 2015-12-06 (×5): qty 1
  Filled 2015-12-06 (×2): qty 3
  Filled 2015-12-06: qty 1
  Filled 2015-12-06: qty 3
  Filled 2015-12-06: qty 1
  Filled 2015-12-06: qty 3
  Filled 2015-12-06: qty 1

## 2015-12-06 MED ORDER — METHYLPREDNISOLONE SODIUM SUCC 125 MG IJ SOLR
125.0000 mg | Freq: Two times a day (BID) | INTRAMUSCULAR | Status: AC
  Administered 2015-12-06 – 2015-12-09 (×6): 125 mg via INTRAVENOUS
  Filled 2015-12-06 (×6): qty 2

## 2015-12-06 MED ORDER — PREDNISONE 50 MG PO TABS: 100.0000 mg | ORAL_TABLET | Freq: Every day | ORAL | Status: DC

## 2015-12-06 MED ORDER — GADOBENATE DIMEGLUMINE 529 MG/ML IV SOLN
35.0000 mL | Freq: Once | INTRAVENOUS | Status: AC | PRN
  Administered 2015-12-06: 35 mL via INTRAVENOUS

## 2015-12-06 NOTE — Progress Notes (Addendum)
Patient Name: Alicia Wood Date of Encounter: 12/06/2015  Active Problems:   Pericardial effusion   Pain in the chest   Hypotension   Length of Stay: 0  SUBJECTIVE  She continues to have significant pain, improved from yesterday.  CURRENT MEDS . aspirin  81 mg Oral Daily  . colchicine  0.6 mg Oral BID  . enoxaparin (LOVENOX) injection  40 mg Subcutaneous Q24H  . Influenza vac split quadrivalent PF  0.5 mL Intramuscular Tomorrow-1000  . pantoprazole  40 mg Oral Daily  . predniSONE  40 mg Oral Q breakfast    OBJECTIVE  Vitals:   12/06/15 0300 12/06/15 0400 12/06/15 0700 12/06/15 0812  BP: 122/84 128/89 122/86 117/85  Pulse: 94 94 94 (!) 102  Resp: (!) 25 (!) 23 (!) 22 (!) 30  Temp:    99 F (37.2 C)  TempSrc:    Oral  SpO2: 94% 96% 94% 96%  Weight:      Height:        Intake/Output Summary (Last 24 hours) at 12/06/15 0844 Last data filed at 12/06/15 0700  Gross per 24 hour  Intake           5237.5 ml  Output              800 ml  Net           4437.5 ml   Filed Weights   12/05/15 0926 12/05/15 2019  Weight: 266 lb (120.7 kg) 271 lb 9.7 oz (123.2 kg)    PHYSICAL EXAM  General: Pleasant, NAD. Neuro: Alert and oriented X 3. Moves all extremities spontaneously. Psych: Normal affect. HEENT:  Normal  Neck: Supple without bruits or JVD. Lungs:  Resp regular and unlabored, CTA. Heart: RRR no s3, s4, or murmurs. Abdomen: Soft, non-tender, non-distended, BS + x 4.  Extremities: No clubbing, cyanosis or edema. DP/PT/Radials 2+ and equal bilaterally.  Accessory Clinical Findings  CBC  Recent Labs  12/05/15 1045 12/06/15 0247  WBC 11.1* 12.8*  HGB 10.2* 10.2*  HCT 32.6* 32.8*  MCV 90.3 89.6  PLT 395 459*   Basic Metabolic Panel  Recent Labs  12/05/15 1045 12/06/15 0247  NA 139 138  K 3.8 3.8  CL 105 104  CO2 24 27  GLUCOSE 110* 114*  BUN 20 12  CREATININE 0.88 0.83  CALCIUM 8.7* 8.9  MG  --  1.7   Liver Function Tests  Recent  Labs  12/05/15 0953  AST 14*  ALT 18  ALKPHOS 74  BILITOT 0.4  PROT 6.2*  ALBUMIN 3.0*    Recent Labs  12/05/15 0953  LIPASE 19   Cardiac Enzymes  Recent Labs  12/05/15 2034 12/06/15 0247  TROPONINI 0.03* <0.03    Radiology/Studies  Dg Chest 2 View  Result Date: 11/11/2015 CLINICAL DATA:  Chest pain EXAM: CHEST  2 VIEW COMPARISON:  08/20/2015 FINDINGS: Mild cardiomegaly that is similar to previous, especially in the lateral projection. Stable aortic and hilar contours. There is no edema, consolidation, effusion, or pneumothorax. IMPRESSION: 1. No acute finding. 2. Mild cardiomegaly. Electronically Signed   By: Marnee Spring M.D.   On: 11/11/2015 18:36   Ct Chest W Contrast  Result Date: 11/22/2015 CLINICAL DATA:  45 year old female with chest pain and shortness of breath. EXAM: CT CHEST WITH CONTRAST TECHNIQUE: Multidetector CT imaging of the chest was performed during intravenous contrast administration. CONTRAST:  75mL ISOVUE-300 IOPAMIDOL (ISOVUE-300) INJECTION 61% COMPARISON:  None. FINDINGS: Cardiovascular: A moderate to  large pericardial effusion is noted. Heart is otherwise unremarkable. There is no evidence of thoracic aortic aneurysm. Mediastinum/Nodes: No enlarged lymph nodes or mediastinal mass. Lungs/Pleura: Mild bibasilar atelectasis noted. There is no evidence of airspace disease, consolidation, nodule, mass, pleural effusion or pneumothorax. Upper Abdomen: Unremarkable Musculoskeletal: No acute or suspicious abnormalities. IMPRESSION: Moderate to large pericardial effusion. These results will be called to the ordering clinician or representative by the Radiologist Assistant, and communication documented in the PACS or zVision Dashboard. Electronically Signed   By: Harmon Pier M.D.   On: 11/22/2015 17:45   Ct Angio Chest Pe W/cm &/or Wo Cm  Result Date: 12/05/2015 CLINICAL DATA:  Chest pain and shortness of Breath starting this morning. Recent hospitalization for  pericardial effusion. EXAM: CT ANGIOGRAPHY CHEST WITH CONTRAST TECHNIQUE: Multidetector CT imaging of the chest was performed using the standard protocol during bolus administration of intravenous contrast. Multiplanar CT image reconstructions and MIPs were obtained to evaluate the vascular anatomy. CONTRAST:  80 cc Isovue 370 COMPARISON:  12/05/2015 and 11/22/2015 FINDINGS: Body habitus and motion artifact reduces exam sensitivity and specificity. Cardiovascular: Mildly dilute contrast bolus with mild heterogeneity of the pulmonary arterial contrast. Reduced sensitivity for subsegmental emboli. No filling defect is identified in the pulmonary arterial tree to suggest pulmonary embolus. No acute aortic findings. Mild cardiomegaly and moderate pericardial effusion, reduced from the large pericardial effusion that was present on 11/22/2015. Mediastinum/Nodes: Is no pathologic adenopathy. Lungs/Pleura: Small bilateral pleural effusions. Mild dependent atelectasis in both lower lobes. Upper Abdomen: 1.3 cm hypodense lesion in the upper pole the left kidney, previously approximately 1.0 cm on 05/29/2009, nonspecific. Musculoskeletal: Thoracic spondylosis. Review of the MIP images confirms the above findings. IMPRESSION: 1. No embolus identified. Reduced sensitivity is specially for smaller emboli due to body habitus, motion artifact, and somewhat heterogeneous contrast bolus in the pulmonary arteries. 2. Moderate pericardial effusion, but reduced from the prominent pericardial effusion that was present on 11/22/2015. 3. Mild cardiomegaly. 4. Small bilateral pleural effusions with dependent atelectasis in both lower lobes. 5. 1.3 cm nonspecific hypodense lesion in the upper pole the left kidney. Back in 2011 this measured about 1.0 cm, accordingly this is most likely benign, but technically nonspecific. Electronically Signed   By: Gaylyn Rong M.D.   On: 12/05/2015 14:03   Dg Chest Portable 1 View  Result Date:  12/05/2015 CLINICAL DATA:  Right chest pain, shortness of breath x3 days EXAM: PORTABLE CHEST 1 VIEW COMPARISON:  CT chest dated 11/22/2015 FINDINGS: Low lung volumes. Increased interstitial markings, favoring vascular crowding. Mild patchy bilateral lower lobe opacities, likely atelectasis. Mild cardiomegaly. IMPRESSION: Low lung volumes. Mild patchy bilateral lower lobe opacities, likely atelectasis. Electronically Signed   By: Charline Bills M.D.   On: 12/05/2015 10:21    TELE: SR    ASSESSMENT AND PLAN  45 year old female recently discharged for acute pericarditis with prolonged course and pain out of proportion to the findings.  She was discharged on Prednisone, currently on 40 mg po daily, colchicine, and NSAIDS. Pericardial effusion has significantly decreased. She has returned today with  Severe chest pain that is pleuritic. CT PE negative, however low specificity for a small PE sec to body habitus. Echo in the ER shows normal LVEF, mild pericardial effusion, dilated RV with mildly to moderately decreased RV systolic function and D shape of the interventricular septum. Her initial BP in 80', improved to 18' with fluid resuscitation.  Cardiac MRI shows findings suggestive of constrictive pericarditis and significant pericardial thickening  and inflammation, I will switch prednisone to solumedrol 125 mg po bid x 3 days, and see if she responds, we will consider a repeat CMR or right sided cath in 4 - 6 weeks.   Signed, Tobias AlexanderKatarina Marquez Ceesay MD, [facility] 12/06/2015

## 2015-12-06 NOTE — Progress Notes (Signed)
PROGRESS NOTE    Alicia Wood  ZOX:[id number] DOB: [birth year] DOA: 12/05/2015 PCP: Pcp Not In System   Brief Narrative:   45 y.o. BF PMHx HLD, Morbid obesity, HTN,  Pericardial effusion/Pericarditis, Chronic Diastolic CHF, Pulmonary Hypertension  Who was recently admitted in cardiology service for chest pain associated with pericardial effusion and pericarditis presented with chest pain worsened for last 2-3 days. Patient was recently admitted from 8/31 to 11/29/15 when she was presented with the complaint of progressive shortness of breath and chest pain. Patient was found to have moderate to large pericardial effusion. Patient was treated with colchicine, and sedated and prednisone and discharged home. Patient reported that her pain was not completely improved after discharge from the hospital. The chest pain started worsening since last Monday, 2-3 days prior to admission. The chest pain is mostly central. She feels like pressure and sometimes radiated to her right arm. The pain increases with deep breathing. The pain is associated with shortness of breath. Patient reported taking her medications at home as prescribed. Patient reported chills and nausea earlier today. Denied coughing, vomiting him a headache, dizziness, abdominal pain. No recent travel or sick contact. Patient's daughter was present at the bedside in the Er.  Denied smoking. No significant cardiac family history, family history reviewed at the bedside. Patient works  in Public affairs consultant at [employer].    Subjective: 9/14 A/O 4, NAD, sitting in bed comfortably talking with family and friends   Assessment & Plan:   Active Problems:   Pericardial effusion   Pain in the chest   Hypotension   Chest pain/Pericardial effusion:  -Recently discharged home on 9/7 after hospital admission for pericardial effusion and pericarditis. Sent home on colchicine, steriods and naproxen which she reports being compliant  with since discharge.  -CT angiogram PE protocol: Negative PE see results below -Bedside echocardiogram: Negative tamponade see results below  Constrictive Pericarditis. -See Cardiac MRI below -Cardiology has changed to high-dose Solu-Medrol -Right side cardiac catheterization recommended. Defer to cardiology  Chronic diastolic CHF   Pulmonary hypertension      DVT prophylaxis: Lovenox Code Status: Full Family Communication: Family present at bedside for discussion of plan Disposition Plan: Per cardiology   Consultants:  Dr. Tobias Alexander Cardiology    Procedures/Significant Events:  9/13 CT Angiogram chest;-negative PE - Moderate pericardial effusion, but reduced from the prominent pericardial effusion that was present on 11/22/2015. -Mild cardiomegaly.-Small bilateral pleural effusions -1.3 cm nonspecific hypodense lesion in the upper pole the left kidney. Back in 2011 this measured about 1.0 cm, 9/13 echocardiogram: LVEF =55% to 60%.  -(grade 2 diastolic dysfunction). - Right ventricle: mildly dilated. . - Pulmonary arteries: PA  peak pressure: 37 mm Hg (S). - Pericardium, extracardiac: A small pericardial effusion was   identified circumferential to the heart. Negative tamponade . 9/14 MRI cardiac:suspicious for a subacute effusive constrictive pericarditis.     Cultures None  Antimicrobials: None   Devices None   LINES / TUBES:  None    Continuous Infusions: . sodium chloride Stopped (12/05/15 2033)  . sodium chloride 125 mL/hr at 12/05/15 2033     Objective: Vitals:   12/06/15 0000 12/06/15 0300 12/06/15 0400 12/06/15 0700  BP:  122/84 128/89 122/86  Pulse:  94 94 94  Resp:  (!) 25 (!) 23 (!) 22  Temp: 100.3 F (37.9 C)     TempSrc: Oral     SpO2:  94% 96% 94%  Weight:      Height:  Intake/Output Summary (Last 24 hours) at 12/06/15 0742 Last data filed at 12/06/15 0700  Gross per 24 hour  Intake           5237.5 ml    Output              800 ml  Net           4437.5 ml   Filed Weights   12/05/15 0926 12/05/15 2019  Weight: 120.7 kg (266 lb) 123.2 kg (271 lb 9.7 oz)    Examination:  General: A/O 4, NAD, No acute respiratory distress Eyes: negative scleral hemorrhage, negative anisocoria, negative icterus ENT: Negative Runny nose, negative gingival bleeding, Neck:  Negative scars, masses, torticollis, lymphadenopathy, JVD Lungs: Clear to auscultation bilaterally without wheezes or crackles Cardiovascular: Regular rate and rhythm without murmur gallop or rub normal S1 and S2 Abdomen: Morbidly obese, negative abdominal pain, nondistended, positive soft, bowel sounds, no rebound, no ascites, no appreciable mass Extremities: No significant cyanosis, clubbing, or edema bilateral lower extremities Skin: Negative rashes, lesions, ulcers Psychiatric:  Negative depression, negative anxiety, negative fatigue, negative mania  Central nervous system:  Cranial nerves II through XII intact, tongue/uvula midline, all extremities muscle strength 5/5, sensation intact throughout, negative dysarthria, negative expressive aphasia, negative receptive aphasia.  .     Data Reviewed: Care during the described time interval was provided by me .  I have reviewed this patient's available data, including medical history, events of note, physical examination, and all test results as part of my evaluation. I have personally reviewed and interpreted all radiology studies.  CBC:  Recent Labs Lab 12/05/15 1045 12/06/15 0247  WBC 11.1* 12.8*  HGB 10.2* 10.2*  HCT 32.6* 32.8*  MCV 90.3 89.6  PLT 395 459*   Basic Metabolic Panel:  Recent Labs Lab 12/05/15 1045 12/06/15 0247  NA 139 138  K 3.8 3.8  CL 105 104  CO2 24 27  GLUCOSE 110* 114*  BUN 20 12  CREATININE 0.88 0.83  CALCIUM 8.7* 8.9  MG  --  1.7   GFR: Estimated Creatinine Clearance: 115.9 mL/min (by C-G formula based on SCr of 0.83 mg/dL). Liver  Function Tests:  Recent Labs Lab 12/05/15 0953  AST 14*  ALT 18  ALKPHOS 74  BILITOT 0.4  PROT 6.2*  ALBUMIN 3.0*    Recent Labs Lab 12/05/15 0953  LIPASE 19   No results for input(s): AMMONIA in the last 168 hours. Coagulation Profile: No results for input(s): INR, PROTIME in the last 168 hours. Cardiac Enzymes:  Recent Labs Lab 12/05/15 2034 12/06/15 0247  TROPONINI 0.03* <0.03   BNP (last 3 results) No results for input(s): PROBNP in the last 8760 hours. HbA1C: No results for input(s): HGBA1C in the last 72 hours. CBG: No results for input(s): GLUCAP in the last 168 hours. Lipid Profile: No results for input(s): CHOL, HDL, LDLCALC, TRIG, CHOLHDL, LDLDIRECT in the last 72 hours. Thyroid Function Tests: No results for input(s): TSH, T4TOTAL, FREET4, T3FREE, THYROIDAB in the last 72 hours. Anemia Panel: No results for input(s): VITAMINB12, FOLATE, FERRITIN, TIBC, IRON, RETICCTPCT in the last 72 hours. Urine analysis:    Component Value Date/Time   COLORURINE AMBER (A) 08/20/2015 2155   APPEARANCEUR CLOUDY (A) 08/20/2015 2155   LABSPEC 1.025 11/11/2015 1723   PHURINE 6.0 11/11/2015 1723   GLUCOSEU NEGATIVE 11/11/2015 1723   HGBUR NEGATIVE 11/11/2015 1723   BILIRUBINUR NEGATIVE 11/11/2015 1723   KETONESUR NEGATIVE 11/11/2015 1723   PROTEINUR NEGATIVE 11/11/2015  1723   UROBILINOGEN 2.0 (H) 11/11/2015 1723   NITRITE NEGATIVE 11/11/2015 1723   LEUKOCYTESUR NEGATIVE 11/11/2015 1723   Sepsis Labs: @LABRCNTIP (procalcitonin:4,lacticidven:4)  ) Recent Results (from the past 240 hour(s))  MRSA PCR Screening     Status: None   Collection Time: 12/05/15  7:42 PM  Result Value Ref Range Status   MRSA by PCR NEGATIVE NEGATIVE Final    Comment:        The GeneXpert MRSA Assay (FDA approved for NASAL specimens only), is one component of a comprehensive MRSA colonization surveillance program. It is not intended to diagnose MRSA infection nor to guide or monitor  treatment for MRSA infections.          Radiology Studies: Ct Angio Chest Pe W/cm &/or Wo Cm  Result Date: 12/05/2015 CLINICAL DATA:  Chest pain and shortness of Breath starting this morning. Recent hospitalization for pericardial effusion. EXAM: CT ANGIOGRAPHY CHEST WITH CONTRAST TECHNIQUE: Multidetector CT imaging of the chest was performed using the standard protocol during bolus administration of intravenous contrast. Multiplanar CT image reconstructions and MIPs were obtained to evaluate the vascular anatomy. CONTRAST:  80 cc Isovue 370 COMPARISON:  12/05/2015 and 11/22/2015 FINDINGS: Body habitus and motion artifact reduces exam sensitivity and specificity. Cardiovascular: Mildly dilute contrast bolus with mild heterogeneity of the pulmonary arterial contrast. Reduced sensitivity for subsegmental emboli. No filling defect is identified in the pulmonary arterial tree to suggest pulmonary embolus. No acute aortic findings. Mild cardiomegaly and moderate pericardial effusion, reduced from the large pericardial effusion that was present on 11/22/2015. Mediastinum/Nodes: Is no pathologic adenopathy. Lungs/Pleura: Small bilateral pleural effusions. Mild dependent atelectasis in both lower lobes. Upper Abdomen: 1.3 cm hypodense lesion in the upper pole the left kidney, previously approximately 1.0 cm on 05/29/2009, nonspecific. Musculoskeletal: Thoracic spondylosis. Review of the MIP images confirms the above findings. IMPRESSION: 1. No embolus identified. Reduced sensitivity is specially for smaller emboli due to body habitus, motion artifact, and somewhat heterogeneous contrast bolus in the pulmonary arteries. 2. Moderate pericardial effusion, but reduced from the prominent pericardial effusion that was present on 11/22/2015. 3. Mild cardiomegaly. 4. Small bilateral pleural effusions with dependent atelectasis in both lower lobes. 5. 1.3 cm nonspecific hypodense lesion in the upper pole the left kidney.  Back in 2011 this measured about 1.0 cm, accordingly this is most likely benign, but technically nonspecific. Electronically Signed   By: Gaylyn Rong M.D.   On: 12/05/2015 14:03   Dg Chest Portable 1 View  Result Date: 12/05/2015 CLINICAL DATA:  Right chest pain, shortness of breath x3 days EXAM: PORTABLE CHEST 1 VIEW COMPARISON:  CT chest dated 11/22/2015 FINDINGS: Low lung volumes. Increased interstitial markings, favoring vascular crowding. Mild patchy bilateral lower lobe opacities, likely atelectasis. Mild cardiomegaly. IMPRESSION: Low lung volumes. Mild patchy bilateral lower lobe opacities, likely atelectasis. Electronically Signed   By: Charline Bills M.D.   On: 12/05/2015 10:21        Scheduled Meds: . aspirin  81 mg Oral Daily  . colchicine  0.6 mg Oral BID  . enoxaparin (LOVENOX) injection  40 mg Subcutaneous Q24H  . Influenza vac split quadrivalent PF  0.5 mL Intramuscular Tomorrow-1000  . pantoprazole  40 mg Oral Daily  . predniSONE  40 mg Oral Q breakfast   Continuous Infusions: . sodium chloride Stopped (12/05/15 2033)  . sodium chloride 125 mL/hr at 12/05/15 2033     LOS: 0 days    Time spent: 40 minutes    WOODS, CURTIS  J, MD [facility] Pager [phone]   If 7PM-7AM, please contact night-coverage [email or web address] Password [facility] 12/06/2015, 7:42 AM

## 2015-12-07 ENCOUNTER — Telehealth: Payer: Self-pay | Admitting: Cardiology

## 2015-12-07 DIAGNOSIS — I951 Orthostatic hypotension: Secondary | ICD-10-CM

## 2015-12-07 DIAGNOSIS — R0781 Pleurodynia: Secondary | ICD-10-CM

## 2015-12-07 LAB — CBC WITH DIFFERENTIAL/PLATELET
Basophils Absolute: 0 10*3/uL (ref 0.0–0.1)
Basophils Relative: 0 %
Eosinophils Absolute: 0 10*3/uL (ref 0.0–0.7)
Eosinophils Relative: 0 %
HCT: 32.7 % — ABNORMAL LOW (ref 36.0–46.0)
Hemoglobin: 10.5 g/dL — ABNORMAL LOW (ref 12.0–15.0)
Lymphocytes Relative: 5 %
Lymphs Abs: 0.7 10*3/uL (ref 0.7–4.0)
MCH: 28.5 pg (ref 26.0–34.0)
MCHC: 32.1 g/dL (ref 30.0–36.0)
MCV: 88.6 fL (ref 78.0–100.0)
Monocytes Absolute: 0.5 10*3/uL (ref 0.1–1.0)
Monocytes Relative: 3 %
Neutro Abs: 13.6 10*3/uL — ABNORMAL HIGH (ref 1.7–7.7)
Neutrophils Relative %: 92 %
Platelets: 457 10*3/uL — ABNORMAL HIGH (ref 150–400)
RBC: 3.69 MIL/uL — ABNORMAL LOW (ref 3.87–5.11)
RDW: 14.4 % (ref 11.5–15.5)
WBC: 14.7 10*3/uL — ABNORMAL HIGH (ref 4.0–10.5)

## 2015-12-07 LAB — BASIC METABOLIC PANEL
Anion gap: 7 (ref 5–15)
BUN: 9 mg/dL (ref 6–20)
CO2: 25 mmol/L (ref 22–32)
Calcium: 9.5 mg/dL (ref 8.9–10.3)
Chloride: 107 mmol/L (ref 101–111)
Creatinine, Ser: 0.79 mg/dL (ref 0.44–1.00)
GFR calc Af Amer: >60 mL/min (ref >60)
GFR calc non Af Amer: >60 mL/min (ref >60)
Glucose, Bld: 138 mg/dL — ABNORMAL HIGH (ref 65–99)
Potassium: 3.6 mmol/L (ref 3.5–5.1)
Sodium: 139 mmol/L (ref 135–145)

## 2015-12-07 LAB — MAGNESIUM: Magnesium: 2 mg/dL (ref 1.7–2.4)

## 2015-12-07 MED ORDER — ACETAMINOPHEN 325 MG PO TABS
650.0000 mg | ORAL_TABLET | ORAL | Status: DC | PRN
Start: 2015-12-07 — End: 2015-12-10

## 2015-12-07 MED ORDER — ENOXAPARIN SODIUM 60 MG/0.6ML ~~LOC~~ SOLN
0.5000 mg/kg | SUBCUTANEOUS | Status: DC
  Administered 2015-12-07 – 2015-12-09 (×3): 60 mg via SUBCUTANEOUS
  Filled 2015-12-07 (×3): qty 0.6

## 2015-12-07 MED ORDER — BISACODYL 10 MG RE SUPP: 10.0000 mg | Freq: Every day | RECTAL | Status: DC | PRN

## 2015-12-07 MED ORDER — FUROSEMIDE 20 MG PO TABS: 20.0000 mg | ORAL_TABLET | Freq: Every day | ORAL | Status: DC

## 2015-12-07 MED ORDER — MAGNESIUM CITRATE PO SOLN
1.0000 | Freq: Once | ORAL | Status: DC | PRN
  Filled 2015-12-07: qty 296

## 2015-12-07 MED ORDER — SENNOSIDES-DOCUSATE SODIUM 8.6-50 MG PO TABS
1.0000 | ORAL_TABLET | Freq: Two times a day (BID) | ORAL | Status: DC
  Administered 2015-12-07 – 2015-12-10 (×7): 1 via ORAL
  Filled 2015-12-07 (×7): qty 1

## 2015-12-07 MED ORDER — POLYETHYLENE GLYCOL 3350 17 G PO PACK
17.0000 g | PACK | Freq: Two times a day (BID) | ORAL | Status: DC
  Administered 2015-12-07 – 2015-12-09 (×5): 17 g via ORAL
  Filled 2015-12-07 (×7): qty 1

## 2015-12-07 NOTE — Progress Notes (Signed)
Called report to 2 west.

## 2015-12-07 NOTE — Telephone Encounter (Signed)
Received Attending Provider Statement from [facility] [employer] via fax for Dr Antoine PocheHochrein to complete.  Sent to [employer] @ [employer] for them to send letter/pkt for AUTH/PMT to process.  Sent via Courier on 12/07/15.  (We have already received FMLA forms via fax and they sent to [employer] on 11/30/15)

## 2015-12-07 NOTE — Progress Notes (Signed)
Patient Name: Alicia Wood Date of Encounter: 12/07/2015  Active Problems:   Pericardial effusion   Pain in the chest   Hypotension   Length of Stay: 1  SUBJECTIVE  The pain improved from yesterday, now 5/10.  CURRENT MEDS . aspirin  81 mg Oral Daily  . colchicine  0.6 mg Oral BID  . enoxaparin (LOVENOX) injection  40 mg Subcutaneous Q24H  . furosemide  20 mg Oral Daily  . ibuprofen  600 mg Oral TID  . Influenza vac split quadrivalent PF  0.5 mL Intramuscular Tomorrow-1000  . methylPREDNISolone (SOLU-MEDROL) injection  125 mg Intravenous Q12H  . pantoprazole  40 mg Oral Daily    OBJECTIVE  Vitals:   12/07/15 0000 12/07/15 0400 12/07/15 0754 12/07/15 0756  BP:  133/87 (!) 123/101 137/89  Pulse:  76 (!) 56   Resp:  (!) 22 15   Temp: 98.2 F (36.8 C) 98.7 F (37.1 C) 98 F (36.7 C)   TempSrc: Oral Oral Oral   SpO2:  96% 98%   Weight:      Height:        Intake/Output Summary (Last 24 hours) at 12/07/15 1013 Last data filed at 12/07/15 0916  Gross per 24 hour  Intake             3470 ml  Output              850 ml  Net             2620 ml   Filed Weights   12/05/15 0926 12/05/15 2019  Weight: 266 lb (120.7 kg) 271 lb 9.7 oz (123.2 kg)    PHYSICAL EXAM  General: Pleasant, NAD. Neuro: Alert and oriented X 3. Moves all extremities spontaneously. Psych: Normal affect. HEENT:  Normal  Neck: Supple without bruits or JVD. Lungs:  Resp regular and unlabored, CTA. Heart: RRR no s3, s4, or murmurs. Abdomen: Soft, non-tender, non-distended, BS + x 4.  Extremities: No clubbing, cyanosis or edema. DP/PT/Radials 2+ and equal bilaterally.  Accessory Clinical Findings  CBC  Recent Labs  12/06/15 0247 12/07/15 0229  WBC 12.8* 14.7*  NEUTROABS  --  13.6*  HGB 10.2* 10.5*  HCT 32.8* 32.7*  MCV 89.6 88.6  PLT 459* 457*   Basic Metabolic Panel  Recent Labs  12/06/15 0247 12/07/15 0229  NA 138 139  K 3.8 3.6  CL 104 107  CO2 27 25  GLUCOSE 114*  138*  BUN 12 9  CREATININE 0.83 0.79  CALCIUM 8.9 9.5  MG 1.7 2.0   Liver Function Tests  Recent Labs  12/05/15 0953  AST 14*  ALT 18  ALKPHOS 74  BILITOT 0.4  PROT 6.2*  ALBUMIN 3.0*    Recent Labs  12/05/15 0953  LIPASE 19   Cardiac Enzymes  Recent Labs  12/05/15 2034 12/06/15 0247  TROPONINI 0.03* <0.03    Radiology/Studies  Dg Chest 2 View  Result Date: 11/11/2015 CLINICAL DATA:  Chest pain EXAM: CHEST  2 VIEW COMPARISON:  08/20/2015 FINDINGS: Mild cardiomegaly that is similar to previous, especially in the lateral projection. Stable aortic and hilar contours. There is no edema, consolidation, effusion, or pneumothorax. IMPRESSION: 1. No acute finding. 2. Mild cardiomegaly. Electronically Signed   By: Marnee Spring M.D.   On: 11/11/2015 18:36   Ct Chest W Contrast  Result Date: 11/22/2015 CLINICAL DATA:  45 year old female with chest pain and shortness of breath. EXAM: CT CHEST WITH CONTRAST TECHNIQUE: Multidetector  CT imaging of the chest was performed during intravenous contrast administration. CONTRAST:  75mL ISOVUE-300 IOPAMIDOL (ISOVUE-300) INJECTION 61% COMPARISON:  None. FINDINGS: Cardiovascular: A moderate to large pericardial effusion is noted. Heart is otherwise unremarkable. There is no evidence of thoracic aortic aneurysm. Mediastinum/Nodes: No enlarged lymph nodes or mediastinal mass. Lungs/Pleura: Mild bibasilar atelectasis noted. There is no evidence of airspace disease, consolidation, nodule, mass, pleural effusion or pneumothorax. Upper Abdomen: Unremarkable Musculoskeletal: No acute or suspicious abnormalities. IMPRESSION: Moderate to large pericardial effusion. These results will be called to the ordering clinician or representative by the Radiologist Assistant, and communication documented in the PACS or zVision Dashboard. Electronically Signed   By: Harmon PierJeffrey  Hu M.D.   On: 11/22/2015 17:45   Ct Angio Chest Pe W/cm &/or Wo Cm  Result Date:  12/05/2015 CLINICAL DATA:  Chest pain and shortness of Breath starting this morning. Recent hospitalization for pericardial effusion. EXAM: CT ANGIOGRAPHY CHEST WITH CONTRAST TECHNIQUE: Multidetector CT imaging of the chest was performed using the standard protocol during bolus administration of intravenous contrast. Multiplanar CT image reconstructions and MIPs were obtained to evaluate the vascular anatomy. CONTRAST:  80 cc Isovue 370 COMPARISON:  12/05/2015 and 11/22/2015 FINDINGS: Body habitus and motion artifact reduces exam sensitivity and specificity. Cardiovascular: Mildly dilute contrast bolus with mild heterogeneity of the pulmonary arterial contrast. Reduced sensitivity for subsegmental emboli. No filling defect is identified in the pulmonary arterial tree to suggest pulmonary embolus. No acute aortic findings. Mild cardiomegaly and moderate pericardial effusion, reduced from the large pericardial effusion that was present on 11/22/2015. Mediastinum/Nodes: Is no pathologic adenopathy. Lungs/Pleura: Small bilateral pleural effusions. Mild dependent atelectasis in both lower lobes. Upper Abdomen: 1.3 cm hypodense lesion in the upper pole the left kidney, previously approximately 1.0 cm on 05/29/2009, nonspecific. Musculoskeletal: Thoracic spondylosis. Review of the MIP images confirms the above findings. IMPRESSION: 1. No embolus identified. Reduced sensitivity is specially for smaller emboli due to body habitus, motion artifact, and somewhat heterogeneous contrast bolus in the pulmonary arteries. 2. Moderate pericardial effusion, but reduced from the prominent pericardial effusion that was present on 11/22/2015. 3. Mild cardiomegaly. 4. Small bilateral pleural effusions with dependent atelectasis in both lower lobes. 5. 1.3 cm nonspecific hypodense lesion in the upper pole the left kidney. Back in 2011 this measured about 1.0 cm, accordingly this is most likely benign, but technically nonspecific.  Electronically Signed   By: Gaylyn RongWalter  Liebkemann M.D.   On: 12/05/2015 14:03   Dg Chest Portable 1 View  Result Date: 12/05/2015 CLINICAL DATA:  Right chest pain, shortness of breath x3 days EXAM: PORTABLE CHEST 1 VIEW COMPARISON:  CT chest dated 11/22/2015 FINDINGS: Low lung volumes. Increased interstitial markings, favoring vascular crowding. Mild patchy bilateral lower lobe opacities, likely atelectasis. Mild cardiomegaly. IMPRESSION: Low lung volumes. Mild patchy bilateral lower lobe opacities, likely atelectasis. Electronically Signed   By: Charline BillsSriyesh  Krishnan M.D.   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 EXAM: CARDIAC MRI TECHNIQUE: The patient was scanned on a 1.5 Tesla GE magnet. A dedicated cardiac coil was used. Functional imaging was done using Fiesta sequences. 2,3, and 4 chamber views were done to assess for RWMA's. Modified Simpson's rule using a short axis stack was used to calculate an ejection fraction on a dedicated work Research officer, trade unionstation using Circle software. The patient received 28 cc of Multihance. After 10 minutes inversion recovery sequences  were used to assess for infiltration and scar tissue. CONTRAST:  28 cc  of Multihance FINDINGS: 1. Normal left ventricular size, thickness and systolic function (LVEF = 57%) with no regional wall motion abnormalities. LVEDD; 54 mm LVESD:  38 mm LVEDV:  157 ml LVESV:  76 ml SV:  89 ml CO:  7.0 L/min 2. Normal right ventricular size, thickness and systolic function (RVEF = 55%) with no regional wall motion abnormalities. 3.  Mild left atrial dilatation. 4.  Mild mitral and trivial tricuspid regurgitation. 5. Normal size of the aortic root, ascending aorta and pulmonary artery. 6. There is mild amount of pericardial effusion predominantly in the proximity to the right ventricle. 7. There is thickening of the pericardium  focally up to 8 mm, and significant circumferential late gadolinium enhancement of the pericardium and epicardium. There is IVC dilatation with no respiratory variation. There is also double bounce of the interventricular septum in diastole. IMPRESSION: 1. Normal left ventricular size, thickness and systolic function (LVEF = 57%) with no regional wall motion abnormalities. 2. Normal right ventricular size, thickness and systolic function (RVEF = 55%) with no regional wall motion abnormalities. 3.  Mild left atrial dilatation. 4.  Mild mitral and trivial tricuspid regurgitation. 5. There is mild amount of pericardial effusion predominantly in the proximity to the right ventricle. 6. There is thickening of the pericardium focally up to 8 mm, and significant circumferential late gadolinium enhancement of the pericardium and epicardium. An IVC dilatation with no respiratory variation is seen. There is also double bounce of the interventricular septum in diastole. 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 A right sided cardiac cath is recommended. Tobias Alexander Electronically Signed   By: Tobias Alexander   On: 12/06/2015 15:04    TELE: SR    ASSESSMENT AND PLAN  45 year old female recently discharged for acute pericarditis with prolonged course and pain out of proportion to the findings.  She was discharged on Prednisone,  admitted on 40 mg po daily, colchicine, and NSAIDS. Pericardial effusion has significantly decreased. She has returned today with  severe chest pain that is pleuritic. CT PE negative, however low specificity for a small PE sec to body habitus. Echo in the ER shows normal LVEF, mild pericardial effusion, dilated RV with mildly to moderately decreased RV systolic function and D shape of the interventricular septum. Her initial BP in 80', improved to 74' with fluid resuscitation.   Cardiac MRI shows findings suggestive of constrictive  pericarditis and significant pericardial thickening and inflammation (see above), I solumedrol 125 mg iv BID on 9/14 with improvement of CP, I will contiue till at least tomorrow and if improved symptoms, discharge on prednisone 60 mg po daily with slow taper.  We will consider a repeat CMR or right sided cath in 4 - 6 weeks.   Signed, Tobias Alexander MD, [facility] 12/07/2015

## 2015-12-07 NOTE — Plan of Care (Signed)
Problem: Activity: Goal: Capacity to carry out activities will improve Outcome: Progressing No complaints of pain today, no SOB

## 2015-12-07 NOTE — Progress Notes (Signed)
[employer] TEAM 1 - Stepdown/ICU TEAM  Alicia Wood  YQI:[id number] DOB: 02/04/1971 DOA: 12/05/2015 PCP: Pcp Not In System    Brief Narrative:  45 y.o.FHx HLD, Morbid obesity, HTN,  Pericardial effusion/Pericarditis, Chronic Diastolic CHF, and Pulmonary Hypertension who was admitted by Cardiology 8/31 to 11/29/15 for a mod/large pericardial effusion and pericarditis who presented again w/ c/o chest pain for 2-3days. Patient was previously treated with colchicine and prednisone and discharged home.   Subjective: The patient continues to have central pleuritic type chest pain but reports that it has improved somewhat.  She denies shortness of breath at rest.  She denies nausea or vomiting but does report abdominal tightness and states that she has not had a bowel movement in many days.  Assessment & Plan:  Chest pain / Constrictive Pericarditis -discharged home 9/7 after admission for pericardial effusion and pericarditis - sent home on colchicine, steriods and naproxen  -CT angio negative PE - TTE notes improved effusion  -cont high-dose Solu-Medrol for constrictive pericarditis  Chronic diastolic CHF -Is not on chronic diuretic therapy - avoid diuresis at present due to hypotension - follow daily weights - if blood pressure stabilizes we'll consider adding ACE inhibitor plus/minus beta blocker Filed Weights   12/05/15 0926 12/05/15 2019  Weight: 120.7 kg (266 lb) 123.2 kg (271 lb 9.7 oz)    Pulmonary hypertension  Constipation Begin bowel regimen  Morbid obesity - Body mass index is 41.3 kg/m.  DVT prophylaxis: Lovenox Code Status: FULL CODE Family Communication: no family present at time of exam  Disposition Plan: Transfer to cardiac telemetry unit  Consultants:  Cardiology  Procedures: 9/13 CTa chest - negative PE - pericardial effusion reduced from 11/22/2015 - small bilateral pleural effusions - 1.3 cm nonspecific hypodense lesion in the upper pole L kidney (in 2011  this measured about 1.0 cm) 9/13 TTE EF 55-60% - grade 2 diastolicdysfunction - small pericardial effusion - negative tamponade 9/14 MRI cardiac - suspicious for a subacute effusive constrictive pericarditis  Antimicrobials:  none  Objective: Blood pressure 137/89, pulse (!) 56, temperature 98 F (36.7 C), temperature source Oral, resp. rate 15, height 5\' 8"  (1.727 m), weight 123.2 kg (271 lb 9.7 oz), SpO2 98 %.  Intake/Output Summary (Last 24 hours) at 12/07/15 1006 Last data filed at 12/07/15 0916  Gross per 24 hour  Intake             3470 ml  Output              850 ml  Net             2620 ml   Filed Weights   12/05/15 0926 12/05/15 2019  Weight: 120.7 kg (266 lb) 123.2 kg (271 lb 9.7 oz)   Examination: General: No acute respiratory distress at rest in bed  Lungs: Clear to auscultation bilaterally without wheezes or crackles Cardiovascular: Regular rate and rhythm without murmur gallop or rub normal S1 and S2 Abdomen: Nontender, obese, soft, bowel sounds positive, no rebound, no ascites, no appreciable mass Extremities: No significant cyanosis, clubbing, or edema bilateral lower extremities  CBC:  Recent Labs Lab 12/05/15 1045 12/06/15 0247 12/07/15 0229  WBC 11.1* 12.8* 14.7*  NEUTROABS  --   --  13.6*  HGB 10.2* 10.2* 10.5*  HCT 32.6* 32.8* 32.7*  MCV 90.3 89.6 88.6  PLT 395 459* 457*   Basic Metabolic Panel:  Recent Labs Lab 12/05/15 1045 12/06/15 0247 12/07/15 0229  NA 139 138 139  K  3.8 3.8 3.6  CL 105 104 107  CO2 24 27 25   GLUCOSE 110* 114* 138*  BUN 20 12 9   CREATININE 0.88 0.83 0.79  CALCIUM 8.7* 8.9 9.5  MG  --  1.7 2.0   GFR: Estimated Creatinine Clearance: 120.2 mL/min (by C-G formula based on SCr of 0.79 mg/dL).  Liver Function Tests:  Recent Labs Lab 12/05/15 0953  AST 14*  ALT 18  ALKPHOS 74  BILITOT 0.4  PROT 6.2*  ALBUMIN 3.0*    Recent Labs Lab 12/05/15 0953  LIPASE 19    Cardiac Enzymes:  Recent Labs Lab  12/05/15 2034 12/06/15 0247  TROPONINI 0.03* <0.03     Recent Results (from the past 240 hour(s))  MRSA PCR Screening     Status: None   Collection Time: 12/05/15  7:42 PM  Result Value Ref Range Status   MRSA by PCR NEGATIVE NEGATIVE Final    Comment:        The GeneXpert MRSA Assay (FDA approved for NASAL specimens only), is one component of a comprehensive MRSA colonization surveillance program. It is not intended to diagnose MRSA infection nor to guide or monitor treatment for MRSA infections.      Scheduled Meds: . aspirin  81 mg Oral Daily  . colchicine  0.6 mg Oral BID  . enoxaparin (LOVENOX) injection  40 mg Subcutaneous Q24H  . ibuprofen  600 mg Oral TID  . Influenza vac split quadrivalent PF  0.5 mL Intramuscular Tomorrow-1000  . methylPREDNISolone (SOLU-MEDROL) injection  125 mg Intravenous Q12H  . pantoprazole  40 mg Oral Daily   Continuous Infusions: . sodium chloride Stopped (12/05/15 2033)  . sodium chloride 125 mL/hr at 12/07/15 0405     LOS: 1 day   [name], MD [facility] Office  [phone] Pager - Text Page per Amion as per below:  On-Call/Text Page:      [email or web address]      password [email or web address]  If 7PM-7AM, please contact night-coverage [email or web address] Password [email or web address] 12/07/2015, 10:06 AM

## 2015-12-07 NOTE — Progress Notes (Signed)
Patient transferred to 2W30 from 2H in no apparent distress.  Patient was oriented to unit to include call light and phone.  Telemetry monitor was applied and CCMD notified.  Will continue to monitor.

## 2015-12-08 LAB — GLUCOSE, CAPILLARY
Glucose-Capillary: 109 mg/dL — ABNORMAL HIGH (ref 65–99)
Glucose-Capillary: 136 mg/dL — ABNORMAL HIGH (ref 65–99)

## 2015-12-08 LAB — CBC
HCT: 31.1 % — ABNORMAL LOW (ref 36.0–46.0)
Hemoglobin: 9.8 g/dL — ABNORMAL LOW (ref 12.0–15.0)
MCH: 28 pg (ref 26.0–34.0)
MCHC: 31.5 g/dL (ref 30.0–36.0)
MCV: 88.9 fL (ref 78.0–100.0)
Platelets: 448 10*3/uL — ABNORMAL HIGH (ref 150–400)
RBC: 3.5 MIL/uL — ABNORMAL LOW (ref 3.87–5.11)
RDW: 14.6 % (ref 11.5–15.5)
WBC: 17.8 10*3/uL — ABNORMAL HIGH (ref 4.0–10.5)

## 2015-12-08 LAB — COMPREHENSIVE METABOLIC PANEL
ALT: 17 U/L (ref 14–54)
AST: 15 U/L (ref 15–41)
Albumin: 2.5 g/dL — ABNORMAL LOW (ref 3.5–5.0)
Alkaline Phosphatase: 75 U/L (ref 38–126)
Anion gap: 9 (ref 5–15)
BUN: 18 mg/dL (ref 6–20)
CO2: 24 mmol/L (ref 22–32)
Calcium: 9.2 mg/dL (ref 8.9–10.3)
Chloride: 108 mmol/L (ref 101–111)
Creatinine, Ser: 0.89 mg/dL (ref 0.44–1.00)
GFR calc Af Amer: >60 mL/min (ref >60)
GFR calc non Af Amer: >60 mL/min (ref >60)
Glucose, Bld: 182 mg/dL — ABNORMAL HIGH (ref 65–99)
Potassium: 3.6 mmol/L (ref 3.5–5.1)
Sodium: 141 mmol/L (ref 135–145)
Total Bilirubin: <0.1 mg/dL — ABNORMAL LOW (ref 0.3–1.2)
Total Protein: 6 g/dL — ABNORMAL LOW (ref 6.5–8.1)

## 2015-12-08 MED ORDER — POTASSIUM CHLORIDE CRYS ER 20 MEQ PO TBCR
20.0000 meq | EXTENDED_RELEASE_TABLET | Freq: Once | ORAL | Status: AC
Start: 2015-12-08 — End: 2015-12-08
  Administered 2015-12-08: 20 meq via ORAL
  Filled 2015-12-08: qty 1

## 2015-12-08 MED ORDER — INSULIN ASPART 100 UNIT/ML ~~LOC~~ SOLN: 0.0000 [IU] | Freq: Three times a day (TID) | SUBCUTANEOUS | Status: DC

## 2015-12-08 NOTE — Progress Notes (Signed)
Patient Name: Alicia Wood Date of Encounter: 12/08/2015  Active Problems:   Pericardial effusion   Pain in the chest   Hypotension   Length of Stay: 2  SUBJECTIVE  The pain improved from yesterday, now 5/10. Has received pain medications due to pain overnight.  CURRENT MEDS . aspirin  81 mg Oral Daily  . colchicine  0.6 mg Oral BID  . enoxaparin (LOVENOX) injection  0.5 mg/kg Subcutaneous Q24H  . ibuprofen  600 mg Oral TID  . insulin aspart  0-9 Units Subcutaneous TID WC  . methylPREDNISolone (SOLU-MEDROL) injection  125 mg Intravenous Q12H  . pantoprazole  40 mg Oral Daily  . polyethylene glycol  17 g Oral BID  . senna-docusate  1 tablet Oral BID    OBJECTIVE  Vitals:   12/07/15 1620 12/07/15 1745 12/07/15 1900 12/08/15 0622  BP: 108/67 114/75 113/76 132/77  Pulse: 67 64 60 (!) 57  Resp: (!) 24 18 18 18   Temp: 98.2 F (36.8 C) 98.2 F (36.8 C) 98.3 F (36.8 C) 97.6 F (36.4 C)  TempSrc: Oral Oral Oral Oral  SpO2: 93% 98% 99% 100%  Weight:      Height:        Intake/Output Summary (Last 24 hours) at 12/08/15 1200 Last data filed at 12/07/15 1342  Gross per 24 hour  Intake              240 ml  Output                0 ml  Net              240 ml   Filed Weights   12/05/15 0926 12/05/15 2019  Weight: 266 lb (120.7 kg) 271 lb 9.7 oz (123.2 kg)    PHYSICAL EXAM  General: Pleasant, NAD. Neuro: Alert and oriented X 3. Moves all extremities spontaneously. Psych: Normal affect. HEENT:  Normal  Neck: Supple without bruits or JVD. Lungs:  Resp regular and unlabored, CTA. Heart: RRR no s3, s4, or murmurs. Abdomen: Soft, non-tender, non-distended, BS + x 4.  Extremities: No clubbing, cyanosis or edema. DP/PT/Radials 2+ and equal bilaterally.  Accessory Clinical Findings  CBC  Recent Labs  12/07/15 0229 12/08/15 0827  WBC 14.7* 17.8*  NEUTROABS 13.6*  --   HGB 10.5* 9.8*  HCT 32.7* 31.1*  MCV 88.6 88.9  PLT 457* 448*   Basic Metabolic  Panel  Recent Labs  16/12/9607/14/17 0247 12/07/15 0229 12/08/15 0415  NA 138 139 141  K 3.8 3.6 3.6  CL 104 107 108  CO2 27 25 24   GLUCOSE 114* 138* 182*  BUN 12 9 18   CREATININE 0.83 0.79 0.89  CALCIUM 8.9 9.5 9.2  MG 1.7 2.0  --    Liver Function Tests  Recent Labs  12/08/15 0415  AST 15  ALT 17  ALKPHOS 75  BILITOT <0.1*  PROT 6.0*  ALBUMIN 2.5*   No results for input(s): LIPASE, AMYLASE in the last 72 hours. Cardiac Enzymes  Recent Labs  12/05/15 2034 12/06/15 0247  TROPONINI 0.03* <0.03    Radiology/Studies  Dg Chest 2 View  Result Date: 11/11/2015 CLINICAL DATA:  Chest pain EXAM: CHEST  2 VIEW COMPARISON:  08/20/2015 FINDINGS: Mild cardiomegaly that is similar to previous, especially in the lateral projection. Stable aortic and hilar contours. There is no edema, consolidation, effusion, or pneumothorax. IMPRESSION: 1. No acute finding. 2. Mild cardiomegaly. Electronically Signed   By: Kathrynn DuckingJonathon  Watts M.D.  On: 11/11/2015 18:36   Ct Chest W Contrast  Result Date: 11/22/2015 CLINICAL DATA:  45 year old female with chest pain and shortness of breath. EXAM: CT CHEST WITH CONTRAST TECHNIQUE: Multidetector CT imaging of the chest was performed during intravenous contrast administration. CONTRAST:  75mL ISOVUE-300 IOPAMIDOL (ISOVUE-300) INJECTION 61% COMPARISON:  None. FINDINGS: Cardiovascular: A moderate to large pericardial effusion is noted. Heart is otherwise unremarkable. There is no evidence of thoracic aortic aneurysm. Mediastinum/Nodes: No enlarged lymph nodes or mediastinal mass. Lungs/Pleura: Mild bibasilar atelectasis noted. There is no evidence of airspace disease, consolidation, nodule, mass, pleural effusion or pneumothorax. Upper Abdomen: Unremarkable Musculoskeletal: No acute or suspicious abnormalities. IMPRESSION: Moderate to large pericardial effusion. These results Kurt Azimi be called to the ordering clinician or representative by the Radiologist Assistant, and  communication documented in the PACS or zVision Dashboard. Electronically Signed   By: Harmon Pier M.D.   On: 11/22/2015 17:45   Ct Angio Chest Pe W/cm &/or Wo Cm  Result Date: 12/05/2015 CLINICAL DATA:  Chest pain and shortness of Breath starting this morning. Recent hospitalization for pericardial effusion. EXAM: CT ANGIOGRAPHY CHEST WITH CONTRAST TECHNIQUE: Multidetector CT imaging of the chest was performed using the standard protocol during bolus administration of intravenous contrast. Multiplanar CT image reconstructions and MIPs were obtained to evaluate the vascular anatomy. CONTRAST:  80 cc Isovue 370 COMPARISON:  12/05/2015 and 11/22/2015 FINDINGS: Body habitus and motion artifact reduces exam sensitivity and specificity. Cardiovascular: Mildly dilute contrast bolus with mild heterogeneity of the pulmonary arterial contrast. Reduced sensitivity for subsegmental emboli. No filling defect is identified in the pulmonary arterial tree to suggest pulmonary embolus. No acute aortic findings. Mild cardiomegaly and moderate pericardial effusion, reduced from the large pericardial effusion that was present on 11/22/2015. Mediastinum/Nodes: Is no pathologic adenopathy. Lungs/Pleura: Small bilateral pleural effusions. Mild dependent atelectasis in both lower lobes. Upper Abdomen: 1.3 cm hypodense lesion in the upper pole the left kidney, previously approximately 1.0 cm on 05/29/2009, nonspecific. Musculoskeletal: Thoracic spondylosis. Review of the MIP images confirms the above findings. IMPRESSION: 1. No embolus identified. Reduced sensitivity is specially for smaller emboli due to body habitus, motion artifact, and somewhat heterogeneous contrast bolus in the pulmonary arteries. 2. Moderate pericardial effusion, but reduced from the prominent pericardial effusion that was present on 11/22/2015. 3. Mild cardiomegaly. 4. Small bilateral pleural effusions with dependent atelectasis in both lower lobes. 5. 1.3 cm  nonspecific hypodense lesion in the upper pole the left kidney. Back in 2011 this measured about 1.0 cm, accordingly this is most likely benign, but technically nonspecific. Electronically Signed   By: Gaylyn Rong M.D.   On: 12/05/2015 14:03   Dg Chest Portable 1 View  Result Date: 12/05/2015 CLINICAL DATA:  Right chest pain, shortness of breath x3 days EXAM: PORTABLE CHEST 1 VIEW COMPARISON:  CT chest dated 11/22/2015 FINDINGS: Low lung volumes. Increased interstitial markings, favoring vascular crowding. Mild patchy bilateral lower lobe opacities, likely atelectasis. Mild cardiomegaly. IMPRESSION: Low lung volumes. Mild patchy bilateral lower lobe opacities, likely atelectasis. Electronically Signed   By: Charline Bills M.D.   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 EXAM: CARDIAC MRI TECHNIQUE: The patient was scanned on a 1.5 Tesla GE magnet. A dedicated cardiac coil was used. Functional imaging was done using Fiesta sequences. 2,3, and 4 chamber views were done to assess for RWMA's. Modified Simpson's rule using  a short axis stack was used to calculate an ejection fraction on a dedicated work [employer]. The patient received 28 cc of Multihance. After 10 minutes inversion recovery sequences were used to assess for infiltration and scar tissue. CONTRAST:  28 cc  of Multihance FINDINGS: 1. Normal left ventricular size, thickness and systolic function (LVEF = 57%) with no regional wall motion abnormalities. LVEDD; 54 mm LVESD:  38 mm LVEDV:  157 ml LVESV:  76 ml SV:  89 ml CO:  7.0 L/min 2. Normal right ventricular size, thickness and systolic function (RVEF = 55%) with no regional wall motion abnormalities. 3.  Mild left atrial dilatation. 4.  Mild mitral and trivial tricuspid regurgitation. 5. Normal size of the aortic root, ascending  aorta and pulmonary artery. 6. There is mild amount of pericardial effusion predominantly in the proximity to the right ventricle. 7. There is thickening of the pericardium focally up to 8 mm, and significant circumferential late gadolinium enhancement of the pericardium and epicardium. There is IVC dilatation with no respiratory variation. There is also double bounce of the interventricular septum in diastole. IMPRESSION: 1. Normal left ventricular size, thickness and systolic function (LVEF = 57%) with no regional wall motion abnormalities. 2. Normal right ventricular size, thickness and systolic function (RVEF = 55%) with no regional wall motion abnormalities. 3.  Mild left atrial dilatation. 4.  Mild mitral and trivial tricuspid regurgitation. 5. There is mild amount of pericardial effusion predominantly in the proximity to the right ventricle. 6. There is thickening of the pericardium focally up to 8 mm, and significant circumferential late gadolinium enhancement of the pericardium and epicardium. An IVC dilatation with no respiratory variation is seen. There is also double bounce of the interventricular septum in diastole. 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 A right sided cardiac cath is recommended. Tobias Alexander Electronically Signed   By: Tobias Alexander   On: 12/06/2015 15:04    TELE: SR    ASSESSMENT AND PLAN  45 year old female recently discharged for acute pericarditis with prolonged course and pain out of proportion to the findings.  She was discharged on Prednisone,  Was given a dose of solumedrol last night, would continue today as this may help to decrease her pain.  would switch her to prednisone potentially tomorrow.    Cardiac MRI shows findings suggestive of constrictive pericarditis and significant pericardial thickening and inflammation (see above), We Jaquilla Woodroof consider a repeat CMR or right sided cath in 4 - 6  weeks.   Signed, Alwaleed Obeso Jorja Loa MD 12/08/2015

## 2015-12-08 NOTE — Progress Notes (Signed)
Paged doctor Regalado informed her that Miss Alicia Wood's heart rate was running in the upper 40's and the pt was not symptomatic. She told me to get a EKG and in form cariology of the results. The EKG showed Sinus brady cariology ordered po potasium and asked us to monotor the pt.

## 2015-12-08 NOTE — Progress Notes (Signed)
Alicia Wood  ZOX:[id number] DOB: 1970/04/20 DOA: 12/05/2015 PCP: Pcp Not In System    Brief Narrative:  45 y.o.FHx HLD, Morbid obesity, HTN,  Pericardial effusion/Pericarditis, Chronic Diastolic CHF, and Pulmonary Hypertension who was admitted by Cardiology 8/31 to 11/29/15 for a mod/large pericardial effusion and pericarditis who presented again w/ c/o chest pain for 2-3days. Patient was previously treated with colchicine and prednisone and discharged home.   Subjective: Not feeling well today, report chest tightness, worse than yesterday   Assessment & Plan:  Chest pain / Constrictive Pericarditis -discharged home 9/7 after admission for pericardial effusion and pericarditis - sent home on colchicine, steriods and naproxen  -CT angio negative PE - TTE notes improved effusion  -cont high-dose Solu-Medrol for constrictive pericarditis -On colchicine.  -Prior work up; negative, HIV, Quantiferon TB negative, ANA negative, RF negative.   Leukocytosis:  Probably related to steroids,  Afebrile.   Hyperglycemia;  in setting steroids.  Will order SSI.   Chronic diastolic CHF - not on chronic diuretic therapy - avoid diuresis at present due to hypotension - follow daily weights - if blood pressure stabilizes we'll consider adding ACE inhibitor plus/minus beta blocker Filed Weights   12/05/15 0926 12/05/15 2019  Weight: 120.7 kg (266 lb) 123.2 kg (271 lb 9.7 oz)    Pulmonary hypertension  Constipation Begin bowel regimen  Morbid obesity - Body mass index is 41.3 kg/m.  DVT prophylaxis: Lovenox Code Status: FULL CODE Family Communication: no family present at time of exam , explain to patient diagnosis, work up done and that she need close follow up with cardiologist  Disposition Plan: home in 24 hours if pain improved.   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 rate 18, height 5\' 8"  (1.727 m), weight 123.2 kg (271 lb 9.7 oz), SpO2 100 %.  Intake/Output Summary (Last 24 hours) at 12/08/15 0734 Last data filed at 12/07/15 1342  Gross per 24 hour  Intake              360 ml  Output                0 ml  Net              360 ml   Filed Weights   12/05/15 0926 12/05/15 2019  Weight: 120.7 kg (266 lb) 123.2 kg (271 lb 9.7 oz)   Examination: General: No acute respiratory distress at rest in bed  Lungs: Clear to auscultation bilaterally without wheezes or crackles Cardiovascular: Regular rate and rhythm without murmur gallop or rub normal S1 and S2 Abdomen: Nontender, obese, soft, bowel sounds positive, no rebound, no ascites, no appreciable mass Extremities: No significant cyanosis, clubbing, or edema bilateral lower extremities  CBC:  Recent Labs Lab 12/05/15 1045 12/06/15 0247 12/07/15 0229  WBC 11.1* 12.8* 14.7*  NEUTROABS  --   --  13.6*  HGB 10.2* 10.2* 10.5*  HCT 32.6* 32.8* 32.7*  MCV 90.3 89.6 88.6  PLT 395 459* 457*   Basic Metabolic Panel:  Recent Labs Lab 12/05/15 1045 12/06/15 0247 12/07/15 0229 12/08/15 0415  NA 139 138 139 141  K 3.8 3.8 3.6 3.6  CL 105 104 107 108  CO2 24 27 25 24   GLUCOSE 110* 114* 138* 182*  BUN 20 12 9 18   CREATININE 0.88 0.83 0.79 0.89  CALCIUM 8.7* 8.9 9.5 9.2  MG  --  1.7 2.0  --    GFR: Estimated Creatinine Clearance: 108.1 mL/min (by C-G formula based on SCr of 0.89 mg/dL).  Liver Function Tests:  Recent Labs Lab 12/05/15 0953 12/08/15 0415  AST 14* 15  ALT 18 17    ALKPHOS 74 75  BILITOT 0.4 <0.1*  PROT 6.2* 6.0*  ALBUMIN 3.0* 2.5*    Recent Labs Lab 12/05/15 0953  LIPASE 19    Cardiac Enzymes:  Recent Labs Lab 12/05/15 2034 12/06/15 0247  TROPONINI 0.03* <0.03     Recent Results (from the past 240 hour(s))  MRSA PCR Screening     Status: None   Collection Time: 12/05/15  7:42 PM  Result Value Ref Range Status   MRSA by PCR NEGATIVE NEGATIVE Final    Comment:        The GeneXpert MRSA Assay (FDA approved for NASAL specimens only), is one component of a comprehensive MRSA colonization surveillance program. It is not intended to diagnose MRSA infection nor to guide or monitor treatment for MRSA infections.      Scheduled Meds: . aspirin  81 mg Oral Daily  . colchicine  0.6 mg Oral BID  . enoxaparin (LOVENOX) injection  0.5 mg/kg Subcutaneous Q24H  . ibuprofen  600 mg Oral TID  . methylPREDNISolone (SOLU-MEDROL) injection  125 mg Intravenous Q12H  . pantoprazole  40 mg Oral Daily  . polyethylene glycol  17 g Oral BID  . senna-docusate  1 tablet Oral BID   Continuous Infusions: . sodium chloride 50 mL/hr at 12/07/15 1500     LOS: 2 days   Hartley BarefootBelkys Gimena Buick, MD [facility] [phone]  On-Call/Text Page:      [email or web address]      password [email or web address]  If 7PM-7AM, please contact night-coverage [email or web address] Password [email or web address] 12/08/2015, 7:34 AM

## 2015-12-09 DIAGNOSIS — I272 Pulmonary hypertension, unspecified: Secondary | ICD-10-CM

## 2015-12-09 DIAGNOSIS — I5032 Chronic diastolic (congestive) heart failure: Secondary | ICD-10-CM

## 2015-12-09 DIAGNOSIS — I311 Chronic constrictive pericarditis: Secondary | ICD-10-CM

## 2015-12-09 LAB — CBC
HCT: 30.6 % — ABNORMAL LOW (ref 36.0–46.0)
Hemoglobin: 9.8 g/dL — ABNORMAL LOW (ref 12.0–15.0)
MCH: 28.1 pg (ref 26.0–34.0)
MCHC: 32 g/dL (ref 30.0–36.0)
MCV: 87.7 fL (ref 78.0–100.0)
Platelets: 425 10*3/uL — ABNORMAL HIGH (ref 150–400)
RBC: 3.49 MIL/uL — ABNORMAL LOW (ref 3.87–5.11)
RDW: 14.9 % (ref 11.5–15.5)
WBC: 14.1 10*3/uL — ABNORMAL HIGH (ref 4.0–10.5)

## 2015-12-09 LAB — BASIC METABOLIC PANEL
Anion gap: 8 (ref 5–15)
BUN: 17 mg/dL (ref 6–20)
CO2: 25 mmol/L (ref 22–32)
Calcium: 8.9 mg/dL (ref 8.9–10.3)
Chloride: 107 mmol/L (ref 101–111)
Creatinine, Ser: 0.87 mg/dL (ref 0.44–1.00)
GFR calc Af Amer: >60 mL/min (ref >60)
GFR calc non Af Amer: >60 mL/min (ref >60)
Glucose, Bld: 121 mg/dL — ABNORMAL HIGH (ref 65–99)
Potassium: 3.8 mmol/L (ref 3.5–5.1)
Sodium: 140 mmol/L (ref 135–145)

## 2015-12-09 LAB — GLUCOSE, CAPILLARY
Glucose-Capillary: 130 mg/dL — ABNORMAL HIGH (ref 65–99)
Glucose-Capillary: 131 mg/dL — ABNORMAL HIGH (ref 65–99)
Glucose-Capillary: 112 mg/dL — ABNORMAL HIGH (ref 65–99)
Glucose-Capillary: 138 mg/dL — ABNORMAL HIGH (ref 65–99)

## 2015-12-09 MED ORDER — PREDNISONE 20 MG PO TABS
80.0000 mg | ORAL_TABLET | Freq: Every day | ORAL | Status: DC
  Administered 2015-12-09 – 2015-12-10 (×2): 80 mg via ORAL
  Filled 2015-12-09: qty 4
  Filled 2015-12-09: qty 8

## 2015-12-09 MED ORDER — ALUM & MAG HYDROXIDE-SIMETH 200-200-20 MG/5ML PO SUSP: 15.0000 mL | ORAL | Status: DC | PRN

## 2015-12-09 MED ORDER — PREDNISONE 20 MG PO TABS: 80.0000 mg | ORAL_TABLET | Freq: Every day | ORAL | Status: DC

## 2015-12-09 NOTE — Progress Notes (Signed)
Alicia Wood  ZOX:[id number] DOB: 07/29/1970 DOA: 12/05/2015 PCP: Pcp Not In System    Brief Narrative:  45 y.o.FHx HLD, Morbid obesity, HTN,  Pericardial effusion/Pericarditis, Chronic Diastolic CHF, and Pulmonary Hypertension who was admitted by Cardiology 8/31 to 11/29/15 for a mod/large pericardial effusion and pericarditis who presented again w/ c/o chest pain for 2-3days. Patient was previously treated with colchicine and prednisone and discharged home.   Subjective: Feeling ok.  Still complaining of chest pain   Assessment & Plan:  Chest pain / Constrictive Pericarditis -Discharged home 9/7 after admission for pericardial effusion and pericarditis - sent home on colchicine, steriods and naproxen  -CT angio negative PE - TTE notes improved effusion  -change from solumedrol to prednisone.  -On colchicine.  -Prior work up; negative, HIV, Quantiferon TB negative, ANA negative, RF negative.  -monitor overnight on prednisone.   Leukocytosis:  Probably related to steroids,  Afebrile.  Trending down.   Hyperglycemia;  in setting steroids.  SSI.   Chronic diastolic CHF - not on chronic diuretic therapy - avoid diuresis at present due to hypotension - follow daily weights - if blood pressure stabilizes we'll consider adding ACE inhibitor plus/minus beta blocker Filed Weights   12/05/15 0926 12/05/15 2019  Weight: 120.7 kg (266 lb) 123.2 kg (271 lb 9.7 oz)    Pulmonary hypertension  Constipation Begin bowel regimen Suppository ordered.   Morbid obesity - Body mass index is 41.3 kg/m.  DVT prophylaxis: Lovenox Code Status: FULL CODE Family Communication: no family present at time of exam , explain to patient diagnosis, work up done and that she need close follow up with cardiologist  Disposition Plan: home in 24 hours if pain improved.   Consultants:  Cardiology  Procedures: 9/13 CTa chest - negative PE - pericardial effusion reduced from 11/22/2015 - small  bilateral pleural effusions - 1.3 cm nonspecific hypodense lesion in the upper pole L kidney (in 2011 this measured about 1.0 cm) 9/13 TTE EF 55-60% - grade 2 diastolicdysfunction - small pericardial effusion - negative tamponade 9/14 MRI cardiac - suspicious for a subacute effusive constrictive pericarditis  Antimicrobials:  none  Objective: Blood pressure (!) 153/80, pulse (!) 45, temperature 98.1 F (36.7 C), temperature source Oral, resp. rate 18, height 5\' 8"  (1.727 m), weight 123.2 kg (271 lb 9.7 oz), SpO2 100 %.  Intake/Output Summary (Last 24 hours) at 12/09/15 1046 Last data filed at 12/09/15 0945  Gross per 24 hour  Intake             1895 ml  Output                0 ml  Net             1895 ml   Filed Weights   12/05/15 0926 12/05/15 2019  Weight: 120.7 kg (266 lb) 123.2 kg (271 lb 9.7 oz)   Examination: General: No acute respiratory distress at rest in bed  Lungs: Clear to auscultation bilaterally without wheezes or crackles Cardiovascular: Regular rate and rhythm without murmur gallop or rub normal S1 and S2 Abdomen: Nontender, obese, soft, bowel sounds positive, no rebound, no ascites, no appreciable mass Extremities: No significant cyanosis, clubbing, or edema bilateral lower extremities  CBC:  Recent Labs Lab 12/05/15 1045 12/06/15 0247 12/07/15 0229 12/08/15 0827 12/09/15 0359  WBC 11.1* 12.8* 14.7* 17.8* 14.1*  NEUTROABS  --   --  13.6*  --   --   HGB 10.2* 10.2* 10.5* 9.8* 9.8*  HCT 32.6* 32.8* 32.7* 31.1* 30.6*  MCV 90.3 89.6 88.6 88.9 87.7  PLT 395 459* 457* 448* 425*   Basic Metabolic Panel:  Recent Labs Lab 12/05/15 1045 12/06/15 0247 12/07/15 0229 12/08/15 0415 12/09/15 0359  NA 139 138 139 141 140  K 3.8 3.8 3.6 3.6 3.8  CL 105 104 107 108 107  CO2 24 27 25 24 25   GLUCOSE 110* 114* 138* 182* 121*  BUN 20 12 9 18 17   CREATININE 0.88 0.83 0.79 0.89 0.87  CALCIUM 8.7* 8.9 9.5 9.2 8.9  MG  --  1.7 2.0  --   --    GFR: Estimated  Creatinine Clearance: 110.5 mL/min (by C-G formula based on SCr of 0.87 mg/dL).  Liver Function Tests:  Recent Labs Lab 12/05/15 0953 12/08/15 0415  AST 14* 15  ALT 18 17  ALKPHOS 74 75  BILITOT 0.4 <0.1*  PROT 6.2* 6.0*  ALBUMIN 3.0* 2.5*    Recent Labs Lab 12/05/15 0953  LIPASE 19    Cardiac Enzymes:  Recent Labs Lab 12/05/15 2034 12/06/15 0247  TROPONINI 0.03* <0.03     Recent Results (from the past 240 hour(s))  MRSA PCR Screening     Status: None   Collection Time: 12/05/15  7:42 PM  Result Value Ref Range Status   MRSA by PCR NEGATIVE NEGATIVE Final    Comment:        The GeneXpert MRSA Assay (FDA approved for NASAL specimens only), is one component of a comprehensive MRSA colonization surveillance program. It is not intended to diagnose MRSA infection nor to guide or monitor treatment for MRSA infections.      Scheduled Meds: . aspirin  81 mg Oral Daily  . colchicine  0.6 mg Oral BID  . enoxaparin (LOVENOX) injection  0.5 mg/kg Subcutaneous Q24H  . ibuprofen  600 mg Oral TID  . insulin aspart  0-9 Units Subcutaneous TID WC  . pantoprazole  40 mg Oral Daily  . polyethylene glycol  17 g Oral BID  . predniSONE  80 mg Oral Q breakfast  . senna-docusate  1 tablet Oral BID   Continuous Infusions: . sodium chloride 50 mL/hr at 12/09/15 0217     LOS: 3 days   Hartley Barefoot, MD [facility] [phone]  On-Call/Text Page:      [email or web address]      password [email or web address]  If 7PM-7AM, please contact night-coverage [email or web address] Password [facility] 12/09/2015, 10:46 AM

## 2015-12-09 NOTE — Progress Notes (Signed)
Patient Name: Alicia Wood Date of Encounter: 12/09/2015  Active Problems:   Pericardial effusion   Pain in the chest   Hypotension   Length of Stay: 3  SUBJECTIVE  Pain continued to improve with solumedrol. Received one dose of pain medications last night, none thus far today.  CURRENT MEDS . aspirin  81 mg Oral Daily  . colchicine  0.6 mg Oral BID  . enoxaparin (LOVENOX) injection  0.5 mg/kg Subcutaneous Q24H  . ibuprofen  600 mg Oral TID  . insulin aspart  0-9 Units Subcutaneous TID WC  . pantoprazole  40 mg Oral Daily  . polyethylene glycol  17 g Oral BID  . senna-docusate  1 tablet Oral BID    OBJECTIVE  Vitals:   12/08/15 0622 12/08/15 1238 12/08/15 2050 12/09/15 0547  BP: 132/77 (!) 150/67 114/72 (!) 153/80  Pulse: (!) 57 (!) 50 (!) 55 (!) 45  Resp: 18 19 18 18   Temp: 97.6 F (36.4 C) 98 F (36.7 C) 97.8 F (36.6 C) 98.1 F (36.7 C)  TempSrc: Oral Oral Oral Oral  SpO2: 100% 99% 100% 100%  Weight:      Height:        Intake/Output Summary (Last 24 hours) at 12/09/15 1001 Last data filed at 12/09/15 0945  Gross per 24 hour  Intake             1895 ml  Output                0 ml  Net             1895 ml   Filed Weights   12/05/15 0926 12/05/15 2019  Weight: 266 lb (120.7 kg) 271 lb 9.7 oz (123.2 kg)    PHYSICAL EXAM  General: Pleasant, NAD. Neuro: Alert and oriented X 3. Moves all extremities spontaneously. Psych: Normal affect. HEENT:  Normal  Neck: Supple without bruits or JVD. Lungs:  Resp regular and unlabored, CTA. Heart: RRR no s3, s4, or murmurs. Abdomen: Soft, non-tender, non-distended, BS + x 4.  Extremities: No clubbing, cyanosis or edema. DP/PT/Radials 2+ and equal bilaterally.  Accessory Clinical Findings  CBC  Recent Labs  12/07/15 0229 12/08/15 0827 12/09/15 0359  WBC 14.7* 17.8* 14.1*  NEUTROABS 13.6*  --   --   HGB 10.5* 9.8* 9.8*  HCT 32.7* 31.1* 30.6*  MCV 88.6 88.9 87.7  PLT 457* 448* 425*   Basic  Metabolic Panel  Recent Labs  12/07/15 0229 12/08/15 0415 12/09/15 0359  NA 139 141 140  K 3.6 3.6 3.8  CL 107 108 107  CO2 25 24 25   GLUCOSE 138* 182* 121*  BUN 9 18 17   CREATININE 0.79 0.89 0.87  CALCIUM 9.5 9.2 8.9  MG 2.0  --   --    Liver Function Tests  Recent Labs  12/08/15 0415  AST 15  ALT 17  ALKPHOS 75  BILITOT <0.1*  PROT 6.0*  ALBUMIN 2.5*   No results for input(s): LIPASE, AMYLASE in the last 72 hours. Cardiac Enzymes No results for input(s): CKTOTAL, CKMB, CKMBINDEX, TROPONINI in the last 72 hours.  Radiology/Studies  Dg Chest 2 View  Result Date: 11/11/2015 CLINICAL DATA:  Chest pain EXAM: CHEST  2 VIEW COMPARISON:  08/20/2015 FINDINGS: Mild cardiomegaly that is similar to previous, especially in the lateral projection. Stable aortic and hilar contours. There is no edema, consolidation, effusion, or pneumothorax. IMPRESSION: 1. No acute finding. 2. Mild cardiomegaly. Electronically Signed  By: Marnee SpringJonathon  Watts M.D.   On: 11/11/2015 18:36   Ct Chest W Contrast  Result Date: 11/22/2015 CLINICAL DATA:  45 year old female with chest pain and shortness of breath. EXAM: CT CHEST WITH CONTRAST TECHNIQUE: Multidetector CT imaging of the chest was performed during intravenous contrast administration. CONTRAST:  75mL ISOVUE-300 IOPAMIDOL (ISOVUE-300) INJECTION 61% COMPARISON:  None. FINDINGS: Cardiovascular: A moderate to large pericardial effusion is noted. Heart is otherwise unremarkable. There is no evidence of thoracic aortic aneurysm. Mediastinum/Nodes: No enlarged lymph nodes or mediastinal mass. Lungs/Pleura: Mild bibasilar atelectasis noted. There is no evidence of airspace disease, consolidation, nodule, mass, pleural effusion or pneumothorax. Upper Abdomen: Unremarkable Musculoskeletal: No acute or suspicious abnormalities. IMPRESSION: Moderate to large pericardial effusion. These results Melika Reder be called to the ordering clinician or representative by the  Radiologist Assistant, and communication documented in the PACS or zVision Dashboard. Electronically Signed   By: Harmon PierJeffrey  Hu M.D.   On: 11/22/2015 17:45   Ct Angio Chest Pe W/cm &/or Wo Cm  Result Date: 12/05/2015 CLINICAL DATA:  Chest pain and shortness of Breath starting this morning. Recent hospitalization for pericardial effusion. EXAM: CT ANGIOGRAPHY CHEST WITH CONTRAST TECHNIQUE: Multidetector CT imaging of the chest was performed using the standard protocol during bolus administration of intravenous contrast. Multiplanar CT image reconstructions and MIPs were obtained to evaluate the vascular anatomy. CONTRAST:  80 cc Isovue 370 COMPARISON:  12/05/2015 and 11/22/2015 FINDINGS: Body habitus and motion artifact reduces exam sensitivity and specificity. Cardiovascular: Mildly dilute contrast bolus with mild heterogeneity of the pulmonary arterial contrast. Reduced sensitivity for subsegmental emboli. No filling defect is identified in the pulmonary arterial tree to suggest pulmonary embolus. No acute aortic findings. Mild cardiomegaly and moderate pericardial effusion, reduced from the large pericardial effusion that was present on 11/22/2015. Mediastinum/Nodes: Is no pathologic adenopathy. Lungs/Pleura: Small bilateral pleural effusions. Mild dependent atelectasis in both lower lobes. Upper Abdomen: 1.3 cm hypodense lesion in the upper pole the left kidney, previously approximately 1.0 cm on 05/29/2009, nonspecific. Musculoskeletal: Thoracic spondylosis. Review of the MIP images confirms the above findings. IMPRESSION: 1. No embolus identified. Reduced sensitivity is specially for smaller emboli due to body habitus, motion artifact, and somewhat heterogeneous contrast bolus in the pulmonary arteries. 2. Moderate pericardial effusion, but reduced from the prominent pericardial effusion that was present on 11/22/2015. 3. Mild cardiomegaly. 4. Small bilateral pleural effusions with dependent atelectasis in  both lower lobes. 5. 1.3 cm nonspecific hypodense lesion in the upper pole the left kidney. Back in 2011 this measured about 1.0 cm, accordingly this is most likely benign, but technically nonspecific. Electronically Signed   By: Gaylyn RongWalter  Liebkemann M.D.   On: 12/05/2015 14:03   Dg Chest Portable 1 View  Result Date: 12/05/2015 CLINICAL DATA:  Right chest pain, shortness of breath x3 days EXAM: PORTABLE CHEST 1 VIEW COMPARISON:  CT chest dated 11/22/2015 FINDINGS: Low lung volumes. Increased interstitial markings, favoring vascular crowding. Mild patchy bilateral lower lobe opacities, likely atelectasis. Mild cardiomegaly. IMPRESSION: Low lung volumes. Mild patchy bilateral lower lobe opacities, likely atelectasis. Electronically Signed   By: Charline BillsSriyesh  Krishnan M.D.   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 EXAM: CARDIAC MRI TECHNIQUE: The patient was scanned on a 1.5 Tesla GE magnet. A dedicated cardiac coil was used. Functional imaging was done using Fiesta sequences. 2,3, and 4 chamber views were done to  assess for RWMA's. Modified Simpson's rule using a short axis stack was used to calculate an ejection fraction on a dedicated work [employer]. The patient received 28 cc of Multihance. After 10 minutes inversion recovery sequences were used to assess for infiltration and scar tissue. CONTRAST:  28 cc  of Multihance FINDINGS: 1. Normal left ventricular size, thickness and systolic function (LVEF = 57%) with no regional wall motion abnormalities. LVEDD; 54 mm LVESD:  38 mm LVEDV:  157 ml LVESV:  76 ml SV:  89 ml CO:  7.0 L/min 2. Normal right ventricular size, thickness and systolic function (RVEF = 55%) with no regional wall motion abnormalities. 3.  Mild left atrial dilatation. 4.  Mild mitral and trivial tricuspid regurgitation. 5. Normal size of  the aortic root, ascending aorta and pulmonary artery. 6. There is mild amount of pericardial effusion predominantly in the proximity to the right ventricle. 7. There is thickening of the pericardium focally up to 8 mm, and significant circumferential late gadolinium enhancement of the pericardium and epicardium. There is IVC dilatation with no respiratory variation. There is also double bounce of the interventricular septum in diastole. IMPRESSION: 1. Normal left ventricular size, thickness and systolic function (LVEF = 57%) with no regional wall motion abnormalities. 2. Normal right ventricular size, thickness and systolic function (RVEF = 55%) with no regional wall motion abnormalities. 3.  Mild left atrial dilatation. 4.  Mild mitral and trivial tricuspid regurgitation. 5. There is mild amount of pericardial effusion predominantly in the proximity to the right ventricle. 6. There is thickening of the pericardium focally up to 8 mm, and significant circumferential late gadolinium enhancement of the pericardium and epicardium. An IVC dilatation with no respiratory variation is seen. There is also double bounce of the interventricular septum in diastole. 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 A right sided cardiac cath is recommended. Tobias Alexander Electronically Signed   By: Tobias Alexander   On: 12/06/2015 15:04    TELE: SR    ASSESSMENT AND PLAN  45 year old female recently discharged for acute pericarditis with prolonged course and pain out of proportion to the findings.  She was discharged on Prednisone,  Was given a dose of solumedrol last night, would continue today as this may help to decrease her pain.  Estes Lehner switch to prednisone today. Hopefully Maloni Musleh have controlled pain and Auria Mckinlay be able to be discharged on this dose with a slow taper. Cardiac MRI shows findings suggestive of constrictive pericarditis and significant pericardial  thickening and inflammation (see above), We Chace Klippel consider a repeat CMR or right sided cath in 4 - 6 weeks.   Signed, Tonilynn Bieker Jorja Loa MD 12/09/2015

## 2015-12-10 LAB — GLUCOSE, CAPILLARY
Glucose-Capillary: 110 mg/dL — ABNORMAL HIGH (ref 65–99)
Glucose-Capillary: 99 mg/dL (ref 65–99)

## 2015-12-10 MED ORDER — PANTOPRAZOLE SODIUM 40 MG PO TBEC: 40.0000 mg | DELAYED_RELEASE_TABLET | Freq: Every day | ORAL | 0 refills | Status: DC

## 2015-12-10 MED ORDER — IBUPROFEN 600 MG PO TABS: 600.0000 mg | ORAL_TABLET | Freq: Three times a day (TID) | ORAL | 0 refills | Status: DC

## 2015-12-10 MED ORDER — PREDNISONE 20 MG PO TABS: ORAL_TABLET | ORAL | 0 refills | Status: DC

## 2015-12-10 MED ORDER — COLCHICINE 0.6 MG PO TABS: 0.6000 mg | ORAL_TABLET | Freq: Two times a day (BID) | ORAL | 0 refills | Status: DC

## 2015-12-10 MED ORDER — HYDROCODONE-ACETAMINOPHEN 5-325 MG PO TABS: 1.0000 | ORAL_TABLET | Freq: Four times a day (QID) | ORAL | 0 refills | Status: DC | PRN

## 2015-12-10 MED ORDER — POLYETHYLENE GLYCOL 3350 17 G PO PACK: 17.0000 g | PACK | Freq: Two times a day (BID) | ORAL | 0 refills | Status: DC

## 2015-12-10 MED ORDER — SENNOSIDES-DOCUSATE SODIUM 8.6-50 MG PO TABS: 1.0000 | ORAL_TABLET | Freq: Two times a day (BID) | ORAL | 0 refills | Status: DC

## 2015-12-10 NOTE — Progress Notes (Signed)
Patient Name: Alicia Wood Date of Encounter: 12/10/2015  Primary Cardiologist: Dr. [facility] Problem List     Active Problems:   Pericardial effusion   Pain in the chest   Hypotension   Constrictive pericarditis   Chronic diastolic CHF (congestive heart failure) (HCC)   Pulmonary hypertension (HCC)     Subjective   Pain is Resolved, no shortness of breath   Inpatient Medications    Scheduled Meds: . aspirin  81 mg Oral Daily  . colchicine  0.6 mg Oral BID  . enoxaparin (LOVENOX) injection  0.5 mg/kg Subcutaneous Q24H  . ibuprofen  600 mg Oral TID  . insulin aspart  0-9 Units Subcutaneous TID WC  . pantoprazole  40 mg Oral Daily  . polyethylene glycol  17 g Oral BID  . predniSONE  80 mg Oral Q breakfast  . senna-docusate  1 tablet Oral BID   Continuous Infusions:  PRN Meds:.acetaminophen, alum & mag hydroxide-simeth, bisacodyl, HYDROcodone-acetaminophen, magnesium citrate, morphine injection, ondansetron (ZOFRAN) IV   Vital Signs    Vitals:   12/09/15 0547 12/09/15 1358 12/09/15 1930 12/10/15 0459  BP: (!) 153/80 128/79 117/71 123/71  Pulse: (!) 45 (!) 54 84 (!) 55  Resp: 18 18 18 16   Temp: 98.1 F (36.7 C) 98.3 F (36.8 C) 98.2 F (36.8 C) 98 F (36.7 C)  TempSrc: Oral Oral Oral Oral  SpO2: 100% 100% 99% 98%  Weight:      Height:        Intake/Output Summary (Last 24 hours) at 12/10/15 0801 Last data filed at 12/09/15 1700  Gross per 24 hour  Intake              720 ml  Output                0 ml  Net              720 ml   Filed Weights   12/05/15 0926 12/05/15 2019  Weight: 266 lb (120.7 kg) 271 lb 9.7 oz (123.2 kg)    Physical Exam    GEN: Well nourished, well developed, in no acute distress.  HEENT: Grossly normal.  Neck: Supple, no JVD, carotid bruits, or masses. Cardiac: RRR, no murmurs, rubs, or gallops. No clubbing, cyanosis, edema.  Radials/DP/PT 2+ and equal bilaterally.  Respiratory:  Respirations regular and  unlabored, clear to auscultation bilaterally. GI: Soft, nontender, nondistended, BS + x 4.Obese MS: no deformity or atrophy. Skin: warm and dry, no rash. Neuro:  Strength and sensation are intact. Psych: AAOx3.  Normal affect.  Labs    CBC  Recent Labs  12/08/15 0827 12/09/15 0359  WBC 17.8* 14.1*  HGB 9.8* 9.8*  HCT 31.1* 30.6*  MCV 88.9 87.7  PLT 448* 425*   Basic Metabolic Panel  Recent Labs  12/08/15 0415 12/09/15 0359  NA 141 140  K 3.6 3.8  CL 108 107  CO2 24 25  GLUCOSE 182* 121*  BUN 18 17  CREATININE 0.89 0.87  CALCIUM 9.2 8.9   Liver Function Tests  Recent Labs  12/08/15 0415  AST 15  ALT 17  ALKPHOS 75  BILITOT <0.1*  PROT 6.0*  ALBUMIN 2.5*    Telemetry    Sinus bradycardia otherwise normal, no adverse arrhythmias - Personally Reviewed  ECG    Sinus bradycardia with no other abnormalities - Personally Reviewed  Radiology    No results found.   Cardiac Studies   Echocardiogram 12/05/15 -  Left ventricle: The cavity size was normal. Systolic function was   normal. The estimated ejection fraction was in the range of 55%   to 60%. Wall motion was normal; there were no regional wall   motion abnormalities. Features are consistent with a pseudonormal   left ventricular filling pattern, with concomitant abnormal   relaxation and increased filling pressure (grade 2 diastolic   dysfunction). - Right ventricle: The cavity size was mildly dilated. Wall   thickness was normal. Systolic function was moderately reduced. - Pulmonary arteries: Systolic pressure was mildly increased. PA   peak pressure: 37 mm Hg (S). - Pericardium, extracardiac: A small pericardial effusion was   identified circumferential to the heart. There was no evidence of   hemodynamic compromise.  Impressions:  - When compared to prior, pericardial effusion is unchanged.   Cardiac tamponade remains a bedside diagnosis. There is no RV or   RA collpase.  Cardiac  MRI 12/06/15: IMPRESSION: 1. Normal left ventricular size, thickness and systolic function (LVEF = 57%) with no regional wall motion abnormalities.  2. Normal right ventricular size, thickness and systolic function (RVEF = 55%) with no regional wall motion abnormalities.  3.  Mild left atrial dilatation.  4.  Mild mitral and trivial tricuspid regurgitation.  5. There is mild amount of pericardial effusion predominantly in the proximity to the right ventricle.  6. There is thickening of the pericardium focally up to 8 mm, and significant circumferential late gadolinium enhancement of the pericardium and epicardium. An IVC dilatation with no respiratory variation is seen.  There is also double bounce of the interventricular septum in diastole.  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 A right sided cardiac cath is recommended.  [name]  Patient Profile     45 year old female with pericarditis, pericardial effusion,  readmission   Assessment & Plan    Acute pericarditis  - complicated course with prolonged hospitalization from 8/30 - 9/7. Moderate to large pericardial effusion previously. She had continued chest pain despite high-dose NSAIDs and colchicine and she was eventually placed on steroids and PPI for a brief period of time. CRP 11. Sedimentation rate consistent with pericarditis.  On day of previous discharge she was not having any chest discomfort.  - Pain continues to improve with steroid  - Cardiac MRI showed findings suggestive of constrictive pericarditis. Consider repeat CMR or right-sided heart cath in 4-6 weeks.  - Blood pressure has improved from initial state.  - With pain controlled, slow taper of prednisone would be reasonable upon discharge home.  - Given her pericardial effusion, I would avoid DVT prophylaxis with Lovenox, use SCDs if necessary.  I think she is ready for  discharge. Continue with NSAIDs, colchicine, prednisone taper over the next 2 weeks    Signed, Donato SchultzMark Skains, MD  12/10/2015, 8:01 AM

## 2015-12-10 NOTE — Discharge Summary (Signed)
Physician Discharge Summary  Alicia Wood UJW:[id number] DOB: 1971-02-07 DOA: 12/05/2015  PCP: Pcp Not In System  Admit date: 12/05/2015 Discharge date: 12/10/2015  Admitted From: Home Disposition:  Home  Recommendations for Outpatient Follow-up:  1. Follow up with PCP in 1-2 weeks 2. Please obtain BMP/CBC in one week     Discharge Condition: Stable.  CODE STATUS: Full code.  Diet recommendation: Heart Healthy   Brief/Interim Summary: 45 y.o.FHxHLD, Morbid obesity, HTN, Pericardial effusion/Pericarditis, Chronic Diastolic CHF, and Pulmonary Hypertension who was admitted by Cardiology 8/31 to 11/29/15 for a mod/large pericardial effusion and pericarditis who presented again w/ c/o chest pain for 2-3days. Patient was previously treated with colchicine and prednisone and discharged home.   Subjective: Feeling ok.  Still complaining of chest pain   Assessment & Plan:  Chest pain / Constrictive Pericarditis -Discharged home 9/7after admission for pericardial effusion and pericarditis - sent home on colchicine, steriods and naproxen  -CT angio negative PE - TTE notes improved effusion  -change from solumedrol to prednisone.  -On colchicine.  -Prior work up; negative, HIV, Quantiferon TB negative, ANA negative, RF negative.  -tolerating prednisone. Discharge on prednisone taper for 2 weeks,  Will need cardiac MRI per cardiology in few months.    Leukocytosis:  Probably related to steroids,  Afebrile.  Trending down.   Hyperglycemia;  in setting steroids.  SSI.  no significant elevation.   Chronic diastolic CHF - not on chronic diuretic therapy - avoid diuresis at present due to hypotension - follow daily weights - if blood pressure stabilizes we'll consider adding ACE inhibitor plus/minus beta blocker     Filed Weights   12/05/15 0926 12/05/15 2019  Weight: 120.7 kg (266 lb) 123.2 kg (271 lb 9.7 oz)    Pulmonary hypertension  Constipation Begin bowel  regimen Suppository ordered.   Morbid obesity - Body mass index is 41.3 kg/m.    Discharge Diagnoses:  Active Problems:   Pericardial effusion   Pain in the chest   Hypotension   Constrictive pericarditis   Chronic diastolic CHF (congestive heart failure) (HCC)   Pulmonary hypertension (HCC)    Discharge Instructions  Discharge Instructions    Diet - low sodium heart healthy    Complete by:  As directed    Increase activity slowly    Complete by:  As directed        Medication List    STOP taking these medications   naproxen 500 MG tablet Commonly known as:  NAPROSYN     TAKE these medications   aspirin 81 MG chewable tablet Chew 1 tablet (81 mg total) by mouth daily.   colchicine 0.6 MG tablet Take 1 tablet (0.6 mg total) by mouth 2 (two) times daily.   HYDROcodone-acetaminophen 5-325 MG tablet Commonly known as:  NORCO Take 1 tablet by mouth every 6 (six) hours as needed for moderate pain.   ibuprofen 600 MG tablet Commonly known as:  ADVIL,MOTRIN Take 1 tablet (600 mg total) by mouth 3 (three) times daily.   pantoprazole 40 MG tablet Commonly known as:  PROTONIX Take 1 tablet (40 mg total) by mouth daily. Start taking on:  12/11/2015   polyethylene glycol packet Commonly known as:  MIRALAX / GLYCOLAX Take 17 g by mouth 2 (two) times daily.   predniSONE 20 MG tablet Commonly known as:  DELTASONE Take 4  tablet for 2 days then 3 tablets for 7 days the 2 tablets for 4 days then 1 tablet for 3 days. What  changed:  how much to take  how to take this  when to take this  additional instructions   senna-docusate 8.6-50 MG tablet Commonly known as:  Senokot-S Take 1 tablet by mouth 2 (two) times daily.       No Known Allergies  Consultations: Cardiology   Procedures/Studies: Dg Chest 2 View  Result Date: 11/11/2015 CLINICAL DATA:  Chest pain EXAM: CHEST  2 VIEW COMPARISON:  08/20/2015 FINDINGS: Mild cardiomegaly that is similar to  previous, especially in the lateral projection. Stable aortic and hilar contours. There is no edema, consolidation, effusion, or pneumothorax. IMPRESSION: 1. No acute finding. 2. Mild cardiomegaly. Electronically Signed   By: Marnee Spring M.D.   On: 11/11/2015 18:36   Ct Chest W Contrast  Result Date: 11/22/2015 CLINICAL DATA:  45 year old female with chest pain and shortness of breath. EXAM: CT CHEST WITH CONTRAST TECHNIQUE: Multidetector CT imaging of the chest was performed during intravenous contrast administration. CONTRAST:  75mL ISOVUE-300 IOPAMIDOL (ISOVUE-300) INJECTION 61% COMPARISON:  None. FINDINGS: Cardiovascular: A moderate to large pericardial effusion is noted. Heart is otherwise unremarkable. There is no evidence of thoracic aortic aneurysm. Mediastinum/Nodes: No enlarged lymph nodes or mediastinal mass. Lungs/Pleura: Mild bibasilar atelectasis noted. There is no evidence of airspace disease, consolidation, nodule, mass, pleural effusion or pneumothorax. Upper Abdomen: Unremarkable Musculoskeletal: No acute or suspicious abnormalities. IMPRESSION: Moderate to large pericardial effusion. These results will be called to the ordering clinician or representative by the Radiologist Assistant, and communication documented in the PACS or zVision Dashboard. Electronically Signed   By: Harmon Pier M.D.   On: 11/22/2015 17:45   Ct Angio Chest Pe W/cm &/or Wo Cm  Result Date: 12/05/2015 CLINICAL DATA:  Chest pain and shortness of Breath starting this morning. Recent hospitalization for pericardial effusion. EXAM: CT ANGIOGRAPHY CHEST WITH CONTRAST TECHNIQUE: Multidetector CT imaging of the chest was performed using the standard protocol during bolus administration of intravenous contrast. Multiplanar CT image reconstructions and MIPs were obtained to evaluate the vascular anatomy. CONTRAST:  80 cc Isovue 370 COMPARISON:  12/05/2015 and 11/22/2015 FINDINGS: Body habitus and motion artifact reduces  exam sensitivity and specificity. Cardiovascular: Mildly dilute contrast bolus with mild heterogeneity of the pulmonary arterial contrast. Reduced sensitivity for subsegmental emboli. No filling defect is identified in the pulmonary arterial tree to suggest pulmonary embolus. No acute aortic findings. Mild cardiomegaly and moderate pericardial effusion, reduced from the large pericardial effusion that was present on 11/22/2015. Mediastinum/Nodes: Is no pathologic adenopathy. Lungs/Pleura: Small bilateral pleural effusions. Mild dependent atelectasis in both lower lobes. Upper Abdomen: 1.3 cm hypodense lesion in the upper pole the left kidney, previously approximately 1.0 cm on 05/29/2009, nonspecific. Musculoskeletal: Thoracic spondylosis. Review of the MIP images confirms the above findings. IMPRESSION: 1. No embolus identified. Reduced sensitivity is specially for smaller emboli due to body habitus, motion artifact, and somewhat heterogeneous contrast bolus in the pulmonary arteries. 2. Moderate pericardial effusion, but reduced from the prominent pericardial effusion that was present on 11/22/2015. 3. Mild cardiomegaly. 4. Small bilateral pleural effusions with dependent atelectasis in both lower lobes. 5. 1.3 cm nonspecific hypodense lesion in the upper pole the left kidney. Back in 2011 this measured about 1.0 cm, accordingly this is most likely benign, but technically nonspecific. Electronically Signed   By: Gaylyn Rong M.D.   On: 12/05/2015 14:03   Dg Chest Portable 1 View  Result Date: 12/05/2015 CLINICAL DATA:  Right chest pain, shortness of breath x3 days EXAM: PORTABLE CHEST 1 VIEW  COMPARISON:  CT chest dated 11/22/2015 FINDINGS: Low lung volumes. Increased interstitial markings, favoring vascular crowding. Mild patchy bilateral lower lobe opacities, likely atelectasis. Mild cardiomegaly. IMPRESSION: Low lung volumes. Mild patchy bilateral lower lobe opacities, likely atelectasis.  Electronically Signed   By: Charline BillsSriyesh  Krishnan M.D.   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 EXAM: CARDIAC MRI TECHNIQUE: The patient was scanned on a 1.5 Tesla GE magnet. A dedicated cardiac coil was used. Functional imaging was done using Fiesta sequences. 2,3, and 4 chamber views were done to assess for RWMA's. Modified Simpson's rule using a short axis stack was used to calculate an ejection fraction on a dedicated work Research officer, trade unionstation using Circle software. The patient received 28 cc of Multihance. After 10 minutes inversion recovery sequences were used to assess for infiltration and scar tissue. CONTRAST:  28 cc  of Multihance FINDINGS: 1. Normal left ventricular size, thickness and systolic function (LVEF = 57%) with no regional wall motion abnormalities. LVEDD; 54 mm LVESD:  38 mm LVEDV:  157 ml LVESV:  76 ml SV:  89 ml CO:  7.0 L/min 2. Normal right ventricular size, thickness and systolic function (RVEF = 55%) with no regional wall motion abnormalities. 3.  Mild left atrial dilatation. 4.  Mild mitral and trivial tricuspid regurgitation. 5. Normal size of the aortic root, ascending aorta and pulmonary artery. 6. There is mild amount of pericardial effusion predominantly in the proximity to the right ventricle. 7. There is thickening of the pericardium focally up to 8 mm, and significant circumferential late gadolinium enhancement of the pericardium and epicardium. There is IVC dilatation with no respiratory variation. There is also double bounce of the interventricular septum in diastole. IMPRESSION: 1. Normal left ventricular size, thickness and systolic function (LVEF = 57%) with no regional wall motion abnormalities. 2. Normal right ventricular size, thickness and systolic function (RVEF = 55%) with no regional wall motion abnormalities. 3.  Mild left atrial  dilatation. 4.  Mild mitral and trivial tricuspid regurgitation. 5. There is mild amount of pericardial effusion predominantly in the proximity to the right ventricle. 6. There is thickening of the pericardium focally up to 8 mm, and significant circumferential late gadolinium enhancement of the pericardium and epicardium. An IVC dilatation with no respiratory variation is seen. There is also double bounce of the interventricular septum in diastole. 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 A right sided cardiac cath is recommended. [name] Electronically Signed   By: Tobias AlexanderKatarina  Nelson   On: 12/06/2015 15:04    (Echo, Carotid, EGD, Colonoscopy, ERCP)    Subjective:   Discharge Exam: Vitals:   12/09/15 1930 12/10/15 0459  BP: 117/71 123/71  Pulse: 84 (!) 55  Resp: 18 16  Temp: 98.2 F (36.8 C) 98 F (36.7 C)   Vitals:   12/09/15 0547 12/09/15 1358 12/09/15 1930 12/10/15 0459  BP: (!) 153/80 128/79 117/71 123/71  Pulse: (!) 45 (!) 54 84 (!) 55  Resp: 18 18 18 16   Temp: 98.1 F (36.7 C) 98.3 F (36.8 C) 98.2 F (36.8 C) 98 F (36.7 C)  TempSrc: Oral Oral Oral Oral  SpO2: 100% 100% 99% 98%  Weight:      Height:        General: Pt is alert, awake, not in acute distress Cardiovascular: RRR, S1/S2 +, no rubs, no gallops  Respiratory: CTA bilaterally, no wheezing, no rhonchi Abdominal: Soft, NT, ND, bowel sounds + Extremities: no edema, no cyanosis    The results of significant diagnostics from this hospitalization (including imaging, microbiology, ancillary and laboratory) are listed below for reference.     Microbiology: Recent Results (from the past 240 hour(s))  MRSA PCR Screening     Status: None   Collection Time: 12/05/15  7:42 PM  Result Value Ref Range Status   MRSA by PCR NEGATIVE NEGATIVE Final    Comment:        The GeneXpert MRSA Assay (FDA approved for NASAL specimens only), is one component of  a comprehensive MRSA colonization surveillance program. It is not intended to diagnose MRSA infection nor to guide or monitor treatment for MRSA infections.      Labs: BNP (last 3 results) No results for input(s): BNP in the last 8760 hours. Basic Metabolic Panel:  Recent Labs Lab 12/05/15 1045 12/06/15 0247 12/07/15 0229 12/08/15 0415 12/09/15 0359  NA 139 138 139 141 140  K 3.8 3.8 3.6 3.6 3.8  CL 105 104 107 108 107  CO2 24 27 25 24 25   GLUCOSE 110* 114* 138* 182* 121*  BUN 20 12 9 18 17   CREATININE 0.88 0.83 0.79 0.89 0.87  CALCIUM 8.7* 8.9 9.5 9.2 8.9  MG  --  1.7 2.0  --   --    Liver Function Tests:  Recent Labs Lab 12/05/15 0953 12/08/15 0415  AST 14* 15  ALT 18 17  ALKPHOS 74 75  BILITOT 0.4 <0.1*  PROT 6.2* 6.0*  ALBUMIN 3.0* 2.5*    Recent Labs Lab 12/05/15 0953  LIPASE 19   No results for input(s): AMMONIA in the last 168 hours. CBC:  Recent Labs Lab 12/05/15 1045 12/06/15 0247 12/07/15 0229 12/08/15 0827 12/09/15 0359  WBC 11.1* 12.8* 14.7* 17.8* 14.1*  NEUTROABS  --   --  13.6*  --   --   HGB 10.2* 10.2* 10.5* 9.8* 9.8*  HCT 32.6* 32.8* 32.7* 31.1* 30.6*  MCV 90.3 89.6 88.6 88.9 87.7  PLT 395 459* 457* 448* 425*   Cardiac Enzymes:  Recent Labs Lab 12/05/15 2034 12/06/15 0247  TROPONINI 0.03* <0.03   BNP: Invalid input(s): POCBNP CBG:  Recent Labs Lab 12/09/15 1118 12/09/15 1650 12/09/15 2129 12/10/15 0619 12/10/15 1139  GLUCAP 131* 112* 138* 110* 99   D-Dimer No results for input(s): DDIMER in the last 72 hours. Hgb A1c No results for input(s): HGBA1C in the last 72 hours. Lipid Profile No results for input(s): CHOL, HDL, LDLCALC, TRIG, CHOLHDL, LDLDIRECT in the last 72 hours. Thyroid function studies No results for input(s): TSH, T4TOTAL, T3FREE, THYROIDAB in the last 72 hours.  Invalid input(s): FREET3 Anemia work up No results for input(s): VITAMINB12, FOLATE, FERRITIN, TIBC, IRON, RETICCTPCT in the  last 72 hours. Urinalysis    Component Value Date/Time   COLORURINE AMBER (A) 08/20/2015 2155   APPEARANCEUR CLOUDY (A) 08/20/2015 2155   LABSPEC 1.025 11/11/2015 1723   PHURINE 6.0 11/11/2015 1723   GLUCOSEU NEGATIVE 11/11/2015 1723   HGBUR NEGATIVE 11/11/2015 1723   BILIRUBINUR NEGATIVE 11/11/2015 1723   KETONESUR NEGATIVE 11/11/2015 1723   PROTEINUR NEGATIVE 11/11/2015 1723   UROBILINOGEN 2.0 (H) 11/11/2015 1723   NITRITE NEGATIVE 11/11/2015 1723   LEUKOCYTESUR NEGATIVE 11/11/2015 1723   Sepsis Labs Invalid input(s): PROCALCITONIN,  WBC,  LACTICIDVEN Microbiology Recent Results (from the past 240 hour(s))  MRSA PCR Screening     Status:  None   Collection Time: 12/05/15  7:42 PM  Result Value Ref Range Status   MRSA by PCR NEGATIVE NEGATIVE Final    Comment:        The GeneXpert MRSA Assay (FDA approved for NASAL specimens only), is one component of a comprehensive MRSA colonization surveillance program. It is not intended to diagnose MRSA infection nor to guide or monitor treatment for MRSA infections.      Time coordinating discharge: Over 30 minutes  SIGNED:   Alba Cory, MD  [facility] 12/10/2015, 11:46 AM Pager   If 7PM-7AM, please contact night-coverage [email or web address] Password [email or web address]

## 2015-12-10 NOTE — Consult Note (Signed)
   [facility]   12/10/2015  Octavio Mannsamela Wood 02/25/1971 [id number]    Came to visit [name] at bedside on behalf of [employer] Oxford[employer] to [facility] Care Management program for [facility] employees/dependents with [facility] insurance. Discussed Link to Wellness program. Confirmed her Primary Care MD is Dr. Docia [facility]Koirala at [facility]. Discussed that she will receive a post hospital discharge call. States she would appreciate the follow up. States that she will like more information on nutrition. Also supplied her with the telephone number for [place]. Confirmed best contact number for [name] as [phone]. Supplied [name] with contact information, Link to [employer] packet, and 24-hr line magnet. Made inpatient aware of visit.   Raiford NobleAtika Aislyn Hayse, MSN-Ed, RN,BSN [facility] Liaison [phone]

## 2015-12-10 NOTE — Progress Notes (Signed)
D/C instructions reviewed with pt. Paper prescriptions given. IV and tele removed. Pt has no further questions. Pt instructed to call when her ride arrives. Will continue to monitor.

## 2015-12-13 ENCOUNTER — Telehealth: Payer: Self-pay | Admitting: Physician Assistant

## 2015-12-13 NOTE — Telephone Encounter (Signed)
Received FMLA Forms and Attending Provider Statement back [facility] [employer] for MGM MIRAGEH Meng, [place] to complete and sign.  Patient is being seen by Harrell LarkH Meng, PA 12/14/15.  Forms put in Meng's correspondence for schedule on 12/14/15. lp

## 2015-12-14 ENCOUNTER — Ambulatory Visit (INDEPENDENT_AMBULATORY_CARE_PROVIDER_SITE_OTHER): Payer: 59 | Admitting: Physician Assistant

## 2015-12-14 ENCOUNTER — Encounter: Payer: Self-pay | Admitting: Physician Assistant

## 2015-12-14 VITALS — BP 120/78 | HR 64 | Ht 68.0 in | Wt 271.8 lb

## 2015-12-14 DIAGNOSIS — I309 Acute pericarditis, unspecified: Secondary | ICD-10-CM | POA: Diagnosis not present

## 2015-12-14 DIAGNOSIS — I3139 Other pericardial effusion (noninflammatory): Secondary | ICD-10-CM

## 2015-12-14 DIAGNOSIS — I10 Essential (primary) hypertension: Secondary | ICD-10-CM | POA: Diagnosis not present

## 2015-12-14 DIAGNOSIS — E785 Hyperlipidemia, unspecified: Secondary | ICD-10-CM

## 2015-12-14 DIAGNOSIS — I319 Disease of pericardium, unspecified: Secondary | ICD-10-CM | POA: Diagnosis not present

## 2015-12-14 DIAGNOSIS — I313 Pericardial effusion (noninflammatory): Secondary | ICD-10-CM

## 2015-12-14 NOTE — Progress Notes (Signed)
Cardiology Office Note    Date:  12/14/2015   ID:  Alicia Wood, DOB October 01, 1970, MRN [id number]  PCP:  Darrow Bussing, MD  Cardiologist:  Dr. [name]   Chief Complaint  Patient presents with  . Hospitalization Follow-up    seen for Dr. [name]    History of Present Illness:  Alicia Wood is a 45 y.o. female with PMH of HTN, HLD, and pericardial effusion. She was originally admitted from 8/31-9/7 when she presented complaining of progressive shortness of breath and chest discomfort and found to have moderate to large pericardial effusion on CT scan. Her TSH was normal. CBC showed some anemia but no active bleeding. Guaiac stool was negative. She was placed on colchicine and a set up for possible tap the effusion if she did not improve with medical therapy. Follow-up echocardiogram showed moderate to large circumferential pericardial effusion but no tamponade not, her colchicine was increased and ibuprofen was added. She was placed on steroid and PPI for brief period of time. She had elevated CRP and sedimentation rate consistent with pericarditis. Steroid was ultimately stopped and the naproxen was added to her medical regimen. She had a repeat echocardiogram on 9/5 that showed decreasing size of effusion and no temponade.   She was seen in the hospital again on 12/05/2015 after developing recurrent chest discomfort. He was given 2 sublingual nitroglycerin by EMS and was noted to be hypotensive. Blood pressure was in the 70s systolic on arrival. She was treated with IV fluid. No sinus rhythm without acute ST-T wave changes. Patient was admitted to hospitalist service, cardiology was consulted on 12/05/2015. I echocardiogram was repeated in the ED on the same day which showed EF 55-60%, grade 2 diastolic dysfunction, moderately reduced RV EF, PA peak pressure 37 mmHg, small circumferential pericardial effusion was noted without hemodynamic compromise. She underwent cardiac MRI which showed  normal EF 57%, RVEF 55%, mild amount of pericardial effusion predominantly in the proximity of the right ventricle, and the pericardial focally up to 8 mm, and a significant circumferential late gadolinium enhancement of the pericardium and epicardium. 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 She was treated with IV Solu-Medrol in the hospital, and he was eventually discharged on oral prednisone with plan for repeat CMR or right heart cath in 4-6 weeks. She has had extensive workup including negative HIV, Quantiferon TB, ASA or RF. She was discharged on steroid taper.  Presents today for cardiology follow-up. She has constant chest tightness, however has improved since started on steroid. She she also complaining of some swelling but no significant shortness of breath, orthopnea or PND episodes. I think swelling is related to the steroid use, she is currently on second day of 3 tablets per day. She will continue on the current dose for 5 more days then go down to 2 tablets a day for 4 days then one tablets a day for 3 days. She will continue on colchicine, we will instruct her to stop Ibuprofen after 2 more weeks. I have filled out her FMLA form, we will reassess her symptom in 3-4 weeks before deciding whether she will require another cardiac MRI versus RHC.    Past Medical History:  Diagnosis Date  . CHF (congestive heart failure) (HCC)   . Hyperlipidemia   . Hypertension   . Pericardial effusion     Past Surgical History:  Procedure Laterality Date  . ABDOMINAL HYSTERECTOMY    . APPENDECTOMY    .  PARTIAL HYSTERECTOMY    . TUBAL LIGATION      Current Medications: Outpatient Medications Prior to Visit  Medication Sig Dispense Refill  . aspirin 81 MG chewable tablet Chew 1 tablet (81 mg total) by mouth daily.    . colchicine 0.6 MG tablet Take 1 tablet (0.6 mg total) by mouth 2 (two) times daily. 42 tablet 0  .  HYDROcodone-acetaminophen (NORCO) 5-325 MG tablet Take 1 tablet by mouth every 6 (six) hours as needed for moderate pain. 10 tablet 0  . ibuprofen (ADVIL,MOTRIN) 600 MG tablet Take 1 tablet (600 mg total) by mouth 3 (three) times daily. 30 tablet 0  . pantoprazole (PROTONIX) 40 MG tablet Take 1 tablet (40 mg total) by mouth daily. 30 tablet 0  . polyethylene glycol (MIRALAX / GLYCOLAX) packet Take 17 g by mouth 2 (two) times daily. 14 each 0  . predniSONE (DELTASONE) 20 MG tablet Take 4  tablet for 2 days then 3 tablets for 7 days the 2 tablets for 4 days then 1 tablet for 3 days. 40 tablet 0  . senna-docusate (SENOKOT-S) 8.6-50 MG tablet Take 1 tablet by mouth 2 (two) times daily. 30 tablet 0   No facility-administered medications prior to visit.      Allergies:   Review of patient's allergies indicates no known allergies.   Social History   Social History  . Marital status: Married    Spouse name: N/A  . Number of children: N/A  . Years of education: N/A   Social History Main Topics  . Smoking status: Never Smoker  . Smokeless tobacco: Never Used  . Alcohol use Yes     Comment: occasional  . Drug use: No  . Sexual activity: Not Asked   Other Topics Concern  . None   Social History Narrative  . None     Family History:  The patient's family history is not on file.   ROS:   Please see the history of present illness.    ROS All other systems reviewed and are negative.   PHYSICAL EXAM:   VS:  BP 120/78   Pulse 64   Ht 5\' 8"  (1.727 m)   Wt 271 lb 12.8 oz (123.3 kg)   SpO2 98%   BMI 41.33 kg/m    GEN: Well nourished, well developed, in no acute distress  HEENT: normal  Neck: no JVD, carotid bruits, or masses Cardiac: RRR; no murmurs, rubs, or gallops,no edema  Respiratory:  clear to auscultation bilaterally, normal work of breathing GI: soft, nontender, nondistended, + BS MS: no deformity or atrophy  Skin: warm and dry, no rash Neuro:  Alert and Oriented x 3,  Strength and sensation are intact Psych: euthymic mood, full affect  Wt Readings from Last 3 Encounters:  12/14/15 271 lb 12.8 oz (123.3 kg)  12/05/15 271 lb 9.7 oz (123.2 kg)  11/22/15 266 lb (120.7 kg)      Studies/Labs Reviewed:   EKG:  EKG is not ordered today.    Recent Labs: 11/22/2015: TSH 0.273 12/07/2015: Magnesium 2.0 12/08/2015: ALT 17 12/09/2015: BUN 17; Creatinine, Ser 0.87; Hemoglobin 9.8; Platelets 425; Potassium 3.8; Sodium 140   Lipid Panel No results found for: CHOL, TRIG, HDL, CHOLHDL, VLDL, LDLCALC, LDLDIRECT  Additional studies/ records that were reviewed today include:   Echo 12/05/2015 LV EF: 55% -   60%  ------------------------------------------------------------------- Indications:      Pericardial effusion 423.9.  ------------------------------------------------------------------- History:   PMH:  Shortness of Breath.  Congestive heart failure. Risk factors:  Hypertension. Dyslipidemia.  ------------------------------------------------------------------- Study Conclusions  - Left ventricle: The cavity size was normal. Systolic function was   normal. The estimated ejection fraction was in the range of 55%   to 60%. Wall motion was normal; there were no regional wall   motion abnormalities. Features are consistent with a pseudonormal   left ventricular filling pattern, with concomitant abnormal   relaxation and increased filling pressure (grade 2 diastolic   dysfunction). - Right ventricle: The cavity size was mildly dilated. Wall   thickness was normal. Systolic function was moderately reduced. - Pulmonary arteries: Systolic pressure was mildly increased. PA   peak pressure: 37 mm Hg (S). - Pericardium, extracardiac: A small pericardial effusion was   identified circumferential to the heart. There was no evidence of   hemodynamic compromise.  Impressions:  - When compared to prior, pericardial effusion is unchanged.   Cardiac  tamponade remains a bedside diagnosis. There is no RV or   RA collpase.   Cardiac MRI 12/06/2015 IMPRESSION: 1. Normal left ventricular size, thickness and systolic function (LVEF = 57%) with no regional wall motion abnormalities.  2. Normal right ventricular size, thickness and systolic function (RVEF = 55%) with no regional wall motion abnormalities.  3.  Mild left atrial dilatation.  4.  Mild mitral and trivial tricuspid regurgitation.  5. There is mild amount of pericardial effusion predominantly in the proximity to the right ventricle.  6. There is thickening of the pericardium focally up to 8 mm, and significant circumferential late gadolinium enhancement of the pericardium and epicardium. An IVC dilatation with no respiratory variation is seen.  There is also double bounce of the interventricular septum in diastole.  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 A right sided cardiac cath is recommended.   ASSESSMENT:    1. Acute pericarditis   2. Pericardial effusion   3. Essential hypertension   4. Hyperlipidemia      PLAN:  In order of problems listed above:  1. Pericarditis with pericardial effusion:  - She still have constant chest discomfort, however this is improving from recent hospitalization. She will continue her steroid taper, she will continue ibuprofen for 2 more weeks before stopping. We will see her back in 3-4 weeks to reassess her symptom and decide whether or not she will need a repeat cardiac MRI versus a right heart cath.  2. Restrictive physiology: She does have grade 2 diastolic dysfunction on echocardiogram, recent cardiac MRI showed restrictive pericarditis with late gadolinium enhancement. Dr. Delton See recommended possible 4-6 weeks repeat cardiac MRI versus right heart cath.  3. HTN: Her blood pressure is well-controlled despite on no blood pressure medication. She does carry a  previous diagnosis of hypertension. If her blood pressure is still well-controlled on follow-up, consider removing this diagnosis. I am not sure why she has grade 2 diastolic dysfunction despite having normal blood pressure at this time, this does raise the probability of underlying restrictive cardiac physiology.  4. HLD: She also carries a diagnosis of hyperlipidemia, again she is not on any statin medication either. I do not see any lipid panel recently, will obtain a fasting lipid panel on follow-up.    Medication Adjustments/Labs and Tests Ordered: Current medicines are reviewed at length with the patient today.  Concerns regarding medicines are outlined above.  Medication changes, Labs and Tests ordered today are listed in the Patient Instructions below. Patient Instructions  Your physician recommends that you schedule  a follow-up appointment in: 3-4 WEEKS [facility]     Alicia SchanzSigned, Alicia Wood, [place]  12/14/2015 11:26 PM    [facility] [address], [place], [place]  [address] Phone: [phone]; Fax: [phone]

## 2015-12-14 NOTE — Patient Instructions (Signed)
Your physician recommends that you schedule a follow-up appointment in: 3-4 WEEKS [facility]

## 2015-12-16 ENCOUNTER — Telehealth: Payer: Self-pay | Admitting: Student

## 2015-12-16 NOTE — Telephone Encounter (Signed)
  Received a call from the patient she is having continued chest discomfort. Was recently diagnosed with pericarditis and is being treated with Colchicine, Ibuprofen, Prednisone, and Norco at this time. Reviewed medications with the patient and she is compliant with current course.   Says the pain is worse when she is laying down, relieved with sitting up. Education provided on the course of pericarditis. Needs a repeat MRI in 4-6 weeks to follow-up on constrictive pathology.   If her pain continues to progress, was informed to come to the ED for further evaluation.   She voiced understanding of this and was appreciative of the call.  Signed, Ellsworth LennoxBrittany M Jonathan Corpus, PA-C 12/16/2015, 8:05 AM Pager: [phone]

## 2015-12-17 ENCOUNTER — Emergency Department (HOSPITAL_COMMUNITY): Payer: 59

## 2015-12-17 ENCOUNTER — Encounter (HOSPITAL_COMMUNITY): Payer: Self-pay | Admitting: Emergency Medicine

## 2015-12-17 ENCOUNTER — Inpatient Hospital Stay (HOSPITAL_COMMUNITY)
Admission: EM | Admit: 2015-12-17 | Discharge: 2015-12-21 | DRG: 315 | Disposition: A | Discharge status: Home / Self Care | Payer: 59 | Attending: Cardiology | Admitting: Cardiology

## 2015-12-17 DIAGNOSIS — Z791 Long term (current) use of non-steroidal anti-inflammatories (NSAID): Secondary | ICD-10-CM

## 2015-12-17 DIAGNOSIS — D649 Anemia, unspecified: Secondary | ICD-10-CM | POA: Diagnosis present

## 2015-12-17 DIAGNOSIS — E785 Hyperlipidemia, unspecified: Secondary | ICD-10-CM | POA: Diagnosis present

## 2015-12-17 DIAGNOSIS — I10 Essential (primary) hypertension: Secondary | ICD-10-CM | POA: Diagnosis present

## 2015-12-17 DIAGNOSIS — R7982 Elevated C-reactive protein (CRP): Secondary | ICD-10-CM | POA: Diagnosis present

## 2015-12-17 DIAGNOSIS — Z7982 Long term (current) use of aspirin: Secondary | ICD-10-CM

## 2015-12-17 DIAGNOSIS — I5032 Chronic diastolic (congestive) heart failure: Secondary | ICD-10-CM | POA: Diagnosis present

## 2015-12-17 DIAGNOSIS — Z7952 Long term (current) use of systemic steroids: Secondary | ICD-10-CM | POA: Diagnosis not present

## 2015-12-17 DIAGNOSIS — I110 Hypertensive heart disease with heart failure: Secondary | ICD-10-CM | POA: Diagnosis present

## 2015-12-17 DIAGNOSIS — R0789 Other chest pain: Secondary | ICD-10-CM | POA: Diagnosis not present

## 2015-12-17 DIAGNOSIS — Z8249 Family history of ischemic heart disease and other diseases of the circulatory system: Secondary | ICD-10-CM | POA: Diagnosis not present

## 2015-12-17 DIAGNOSIS — R10816 Epigastric abdominal tenderness: Secondary | ICD-10-CM | POA: Diagnosis not present

## 2015-12-17 DIAGNOSIS — I272 Other secondary pulmonary hypertension: Secondary | ICD-10-CM | POA: Diagnosis present

## 2015-12-17 DIAGNOSIS — Z79899 Other long term (current) drug therapy: Secondary | ICD-10-CM | POA: Diagnosis not present

## 2015-12-17 DIAGNOSIS — I311 Chronic constrictive pericarditis: Principal | ICD-10-CM | POA: Diagnosis present

## 2015-12-17 DIAGNOSIS — R079 Chest pain, unspecified: Secondary | ICD-10-CM

## 2015-12-17 DIAGNOSIS — I319 Disease of pericardium, unspecified: Secondary | ICD-10-CM

## 2015-12-17 DIAGNOSIS — I309 Acute pericarditis, unspecified: Secondary | ICD-10-CM | POA: Diagnosis not present

## 2015-12-17 DIAGNOSIS — I313 Pericardial effusion (noninflammatory): Secondary | ICD-10-CM | POA: Diagnosis present

## 2015-12-17 DIAGNOSIS — R1310 Dysphagia, unspecified: Secondary | ICD-10-CM | POA: Diagnosis present

## 2015-12-17 LAB — CBC
HCT: 34.2 % — ABNORMAL LOW (ref 36.0–46.0)
Hemoglobin: 10.8 g/dL — ABNORMAL LOW (ref 12.0–15.0)
MCH: 28.1 pg (ref 26.0–34.0)
MCHC: 31.6 g/dL (ref 30.0–36.0)
MCV: 88.8 fL (ref 78.0–100.0)
Platelets: 352 10*3/uL (ref 150–400)
RBC: 3.85 MIL/uL — ABNORMAL LOW (ref 3.87–5.11)
RDW: 15.6 % — ABNORMAL HIGH (ref 11.5–15.5)
WBC: 13.9 10*3/uL — ABNORMAL HIGH (ref 4.0–10.5)

## 2015-12-17 LAB — C-REACTIVE PROTEIN: CRP: 17.2 mg/dL — ABNORMAL HIGH (ref <1.0)

## 2015-12-17 LAB — BASIC METABOLIC PANEL
Anion gap: 7 (ref 5–15)
BUN: 14 mg/dL (ref 6–20)
CO2: 31 mmol/L (ref 22–32)
Calcium: 9 mg/dL (ref 8.9–10.3)
Chloride: 98 mmol/L — ABNORMAL LOW (ref 101–111)
Creatinine, Ser: 0.75 mg/dL (ref 0.44–1.00)
GFR calc Af Amer: >60 mL/min (ref >60)
GFR calc non Af Amer: >60 mL/min (ref >60)
Glucose, Bld: 130 mg/dL — ABNORMAL HIGH (ref 65–99)
Potassium: 4.1 mmol/L (ref 3.5–5.1)
Sodium: 136 mmol/L (ref 135–145)

## 2015-12-17 LAB — I-STAT TROPONIN, ED: Troponin i, poc: 0 ng/mL (ref 0.00–0.08)

## 2015-12-17 LAB — I-STAT CG4 LACTIC ACID, ED: Lactic Acid, Venous: 1.5 mmol/L (ref 0.5–1.9)

## 2015-12-17 LAB — SEDIMENTATION RATE: Sed Rate: 39 mm/hr — ABNORMAL HIGH (ref 0–22)

## 2015-12-17 MED ORDER — PANTOPRAZOLE SODIUM 40 MG PO TBEC
40.0000 mg | DELAYED_RELEASE_TABLET | Freq: Every day | ORAL | Status: DC
Start: 2015-12-18 — End: 2015-12-20
  Administered 2015-12-18 – 2015-12-19 (×2): 40 mg via ORAL
  Filled 2015-12-17 (×2): qty 1

## 2015-12-17 MED ORDER — SODIUM CHLORIDE 0.9 % IV BOLUS (SEPSIS)
500.0000 mL | Freq: Once | INTRAVENOUS | Status: AC
  Administered 2015-12-17: 500 mL via INTRAVENOUS

## 2015-12-17 MED ORDER — COLCHICINE 0.6 MG PO TABS
0.6000 mg | ORAL_TABLET | Freq: Two times a day (BID) | ORAL | Status: DC
  Administered 2015-12-17 – 2015-12-21 (×8): 0.6 mg via ORAL
  Filled 2015-12-17 (×9): qty 1

## 2015-12-17 MED ORDER — ASPIRIN 81 MG PO CHEW
81.0000 mg | CHEWABLE_TABLET | Freq: Every day | ORAL | Status: DC
Start: 2015-12-18 — End: 2015-12-21
  Administered 2015-12-18 – 2015-12-21 (×3): 81 mg via ORAL
  Filled 2015-12-17 (×3): qty 1

## 2015-12-17 MED ORDER — IBUPROFEN 600 MG PO TABS
600.0000 mg | ORAL_TABLET | Freq: Three times a day (TID) | ORAL | Status: DC
  Administered 2015-12-17 – 2015-12-21 (×12): 600 mg via ORAL
  Filled 2015-12-17 (×13): qty 1

## 2015-12-17 MED ORDER — METHYLPREDNISOLONE SODIUM SUCC 125 MG IJ SOLR
80.0000 mg | Freq: Two times a day (BID) | INTRAMUSCULAR | Status: DC
  Administered 2015-12-17 – 2015-12-18 (×2): 80 mg via INTRAVENOUS
  Filled 2015-12-17 (×2): qty 2

## 2015-12-17 MED ORDER — HYDROMORPHONE HCL 1 MG/ML IJ SOLN
0.5000 mg | INTRAMUSCULAR | Status: DC | PRN
  Administered 2015-12-17 – 2015-12-20 (×5): 0.5 mg via INTRAVENOUS
  Filled 2015-12-17 (×6): qty 1

## 2015-12-17 MED ORDER — FENTANYL CITRATE (PF) 100 MCG/2ML IJ SOLN
50.0000 ug | Freq: Once | INTRAMUSCULAR | Status: AC
  Administered 2015-12-17: 50 ug via INTRAVENOUS
  Filled 2015-12-17: qty 2

## 2015-12-17 NOTE — ED Triage Notes (Signed)
Pt here for left sided CP x 3 days; pt sts hx of pericarditis; pt sts increased SOB

## 2015-12-17 NOTE — ED Provider Notes (Signed)
[facility]-EMERGENCY DEPT Provider Note   CSN: [id number] Arrival date & time: 12/17/15  1039     History   Chief Complaint Chief Complaint  Patient presents with  . Chest Pain    HPI Alicia Wood is a 45 y.o. female.  HPI   Patient to the ER with PMH of hypertension, hyperlipidemia, recent pericardial effusion and admission x 2 in the past month with chest pain. She says that when she leaves the hospital she feels good for 4-5 days until the pain starts to come back and becomes unbearable. She saw her cardiologist on Friday and told him she was hurting. She spoke with the cardiology PA on Sunday and told him she was hurting. She was supposed to see her PCP today but since she was having such severe pain they recommended the ER. She describes her pain as tight and pressure. Typically it is right side today it is left. Sitting up is the only position that brings her comfort. She is having SOB, pain and fatigue. The pain medications she is on at home is not helping.  She has not had cough, fever, headache, chills, diarrhea, vomiting, back pain, neck pain, change in vision, focal weakness, confusion, ear pain, le swelling, diaphoresis.  Past Medical History:  Diagnosis Date  . CHF (congestive heart failure) (HCC)   . Hyperlipidemia   . Hypertension   . Pericardial effusion     Patient Active Problem List   Diagnosis Date Noted  . Constrictive pericarditis   . Chronic diastolic CHF (congestive heart failure) (HCC)   . Pulmonary hypertension (HCC)   . Pain in the chest 12/05/2015  . Hypotension 12/05/2015  . Arterial hypotension   . Pericardial effusion 11/22/2015    Past Surgical History:  Procedure Laterality Date  . ABDOMINAL HYSTERECTOMY    . APPENDECTOMY    . PARTIAL HYSTERECTOMY    . TUBAL LIGATION      OB History    No data available       Home Medications    Prior to Admission medications   Medication Sig Start Date End Date Taking? Authorizing Provider    aspirin 81 MG chewable tablet Chew 1 tablet (81 mg total) by mouth daily. 11/29/15   Arty Baumgartner, NP  colchicine 0.6 MG tablet Take 1 tablet (0.6 mg total) by mouth 2 (two) times daily. 12/10/15   Belkys A Regalado, MD  HYDROcodone-acetaminophen (NORCO) 5-325 MG tablet Take 1 tablet by mouth every 6 (six) hours as needed for moderate pain. 12/10/15   Belkys A Regalado, MD  ibuprofen (ADVIL,MOTRIN) 600 MG tablet Take 1 tablet (600 mg total) by mouth 3 (three) times daily. 12/10/15   Belkys A Regalado, MD  pantoprazole (PROTONIX) 40 MG tablet Take 1 tablet (40 mg total) by mouth daily. 12/11/15   Belkys A Regalado, MD  polyethylene glycol (MIRALAX / GLYCOLAX) packet Take 17 g by mouth 2 (two) times daily. 12/10/15   Belkys A Regalado, MD  predniSONE (DELTASONE) 20 MG tablet Take 4  tablet for 2 days then 3 tablets for 7 days the 2 tablets for 4 days then 1 tablet for 3 days. 12/10/15   Belkys A Regalado, MD  senna-docusate (SENOKOT-S) 8.6-50 MG tablet Take 1 tablet by mouth 2 (two) times daily. 12/10/15   Alba Cory, MD    Family History History reviewed. No pertinent family history.  Social History Social History  Substance Use Topics  . Smoking status: Never Smoker  . Smokeless tobacco:  Never Used  . Alcohol use Yes     Comment: occasional     Allergies   Review of patient's allergies indicates no known allergies.   Review of Systems Review of Systems  Review of Systems All other systems negative except as documented in the HPI. All pertinent positives and negatives as reviewed in the HPI.  Physical Exam Updated Vital Signs BP 109/67 (BP Location: Right Arm)   Pulse 87   Temp 98.2 F (36.8 C) (Oral)   Resp 21   Ht 5\' 8"  (1.727 m)   Wt 122.9 kg   SpO2 97%   BMI 41.21 kg/m   Physical Exam Constitutional: She is oriented to person, place, and time. She appears well-developed and well-nourished. She appears uncomfortable.   Sitting up in bed  HENT:  Head:  Normocephalic and atraumatic.  Moist mucous membranes  Eyes: Conjunctivae are normal. Pupils are equal, round, and reactive to light.  Neck: Neck supple.  Cardiovascular: Normal rate, regular rhythm and normal heart sounds.   No murmur heard. Quiet heart sounds  Pulmonary/Chest: Breath sounds normal.  Abdominal: Soft. Bowel sounds are normal. She exhibits no distension. There is no tenderness.  Musculoskeletal: She exhibits no edema.  Neurological: She is alert and oriented to person, place, and time.  Fluent speech  Skin: Skin is warm and dry.  Psychiatric:  Appears calm Nursing note and vitals reviewed.   ED Treatments / Results  Labs (all labs ordered are listed, but only abnormal results are displayed) Labs Reviewed  BASIC METABOLIC PANEL - Abnormal; Notable for the following:       Result Value   Chloride 98 (*)    Glucose, Bld 130 (*)    All other components within normal limits  CBC - Abnormal; Notable for the following:    WBC 13.9 (*)    RBC 3.85 (*)    Hemoglobin 10.8 (*)    HCT 34.2 (*)    RDW 15.6 (*)    All other components within normal limits  I-STAT TROPOININ, ED  I-STAT CG4 LACTIC ACID, ED    EKG  EKG Interpretation None       Radiology Dg Chest 2 View  Result Date: 12/17/2015 CLINICAL DATA:  Patient with chest pressure and tightness. Recent history of pericarditis. EXAM: CHEST  2 VIEW COMPARISON:  Chest radiograph 12/05/2015. FINDINGS: Low lung volumes. Stable enlarged cardiac and mediastinal contours. Persistent consolidation within the left lower hemi thorax. Small left pleural effusion. No pneumothorax. Thoracic spine degenerative changes. IMPRESSION: Consolidation within the left lower hemi thorax with associated pleural fluid. Infection not excluded. Mild cardiomegaly. Electronically Signed   By: Annia Belt M.D.   On: 12/17/2015 12:01    Procedures Procedures (including critical care time)  Medications Ordered in ED Medications - No data  to display   Initial Impression / Assessment and Plan / ED Course  I have reviewed the triage vital signs and the nursing notes.  Pertinent labs & imaging results that were available during my care of the patient were reviewed by me and considered in my medical decision making (see chart for details).  Clinical Course   Patient will need bedside cardiac [place] and likely admission for her on-going CP. Discussed case with Dr. Dalene Seltzer, she will do ultrasound. @ 1:09 pm- At end of shift patient signed out to [employer], PA-C- pt will require admission and close monitoring.  Final Clinical Impressions(s) / ED Diagnoses   Final diagnoses:  None  New Prescriptions New Prescriptions   No medications on file     Marlon Peliffany Dezhane Staten, Cordelia Poche-C 12/17/15 1310    Alvira MondayErin Schlossman, MD 12/21/15 1048

## 2015-12-17 NOTE — ED Notes (Signed)
Attempted report 

## 2015-12-17 NOTE — ED Notes (Signed)
Pt ambulatory to restroom

## 2015-12-17 NOTE — ED Notes (Signed)
Cards at bedside

## 2015-12-17 NOTE — H&P (Signed)
Patient ID: Alicia Wood MRN: [id number], DOB/AGE: 1970/07/21   Admit date: 12/17/2015   Primary Physician: Lujean Amel, MD Primary Cardiologist: Dr. Percival Spanish   Pt. Profile:  44 y/o female with recent diagnosis of pericarditis and moderate to large pericardial effusion w/o tamponade physiology, multiple admissions x 2 in the past month, improvement on last echo to mild effusion, presenting back with worsening recurrent CP and dyspnea.   Problem List  Past Medical History:  Diagnosis Date  . CHF (congestive heart failure) ([place])   . Hyperlipidemia   . Hypertension   . Pericardial effusion     Past Surgical History:  Procedure Laterality Date  . ABDOMINAL HYSTERECTOMY    . APPENDECTOMY    . PARTIAL HYSTERECTOMY    . TUBAL LIGATION       Allergies  No Known Allergies  HPI  45 y/o female with recent h/o pericarditis and moderate to large pericardial effusion w/o tamponade physiology, first diagnosed on first admission 8/31-9/7. She initially presented with complaint of progressive SOB and chest discomfort and was found to have moderate to large pericardial effusion on CT scan. Her TSH was normal. CBC showed some anemia but no active bleeding. Guaiac stool was negative. She was placed on colchicine and a set up for possible tap the effusion if she did not improve with medical therapy. Follow-up echocardiogram showed moderate to large circumferential pericardial effusion but no tamponade not, her colchicine was increased and ibuprofen was added. She was placed on steroid and PPI for brief period of time. She had elevated CRP and sedimentation rate consistent with pericarditis. Steroid was ultimately stopped and the naproxen was added to her medical regimen. She had a repeat echocardiogram on 9/5 that showed decreasing size of effusion and no temponade.   She presented back for repeat admission 12/05/2015 after developing recurrent CP and dyspnea. Repeat echo showed EF 55-60%,  grade 2 diastolic dysfunction, moderately reduced RV EF, PA peak pressure 37 mmHg, small circumferential pericardial effusion was noted without hemodynamic compromise. She underwent cardiac MRI which showed normal EF 57%, RVEF 55%, mild amount of pericardial effusion predominantly in the proximity of the right ventricle, and the pericardial focally up to 8 mm, and a significant circumferential late gadolinium enhancement of the pericardium and epicardium. 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 She was treated with IV Solu-Medrol in the hospital, and she was eventually discharged on oral prednisone with plan for repeat CMRI or right heart cath in 4-6 weeks. She has had extensive workup including negative HIV, Quantiferon TB, ASA or RF. She was discharged on steroid taper.  She was seen in clinic on 12/14/15 for post hospital f/u. She continued to complain of chest tightness, however improved since starting steroids. No significant dyspnea. She was instructed to continue steroid taper and f/u in 3-4 weeks to reassess symptoms to determine if she would need a RHC vs MRI.   She presents back to the ED with complaint of recurrent pain, progressively worsening. This recurrence is more severe than last presentation. She notes severe chest tightness and dyspnea, worse in supine position. Improved some sitting up right. She notes mild occasional lightheaded but denies syncope/ near syncope. It is pleuritic, worse with deep breaths, cough and sudden movements. She reports full medication compliance. BP is stable in the ED. White count is elevated at 13.9. Hgb is 10.8. Both improved from last assessment.  EKG shows NSR. HR in the 80s.   Home Medications  Prior to  Admission medications   Medication Sig Start Date End Date Taking? Authorizing Provider  aspirin 81 MG chewable tablet Chew 1 tablet (81 mg total) by mouth daily. 11/29/15  Yes Cheryln Manly, NP  colchicine 0.6 MG  tablet Take 1 tablet (0.6 mg total) by mouth 2 (two) times daily. 12/10/15  Yes Belkys A Regalado, MD  HYDROcodone-acetaminophen (NORCO) 5-325 MG tablet Take 1 tablet by mouth every 6 (six) hours as needed for moderate pain. 12/10/15  Yes Belkys A Regalado, MD  ibuprofen (ADVIL,MOTRIN) 600 MG tablet Take 1 tablet (600 mg total) by mouth 3 (three) times daily. 12/10/15  Yes Belkys A Regalado, MD  pantoprazole (PROTONIX) 40 MG tablet Take 1 tablet (40 mg total) by mouth daily. 12/11/15  Yes Belkys A Regalado, MD  polyethylene glycol (MIRALAX / GLYCOLAX) packet Take 17 g by mouth 2 (two) times daily. 12/10/15  Yes Belkys A Regalado, MD  predniSONE (DELTASONE) 20 MG tablet Take 4  tablet for 2 days then 3 tablets for 7 days the 2 tablets for 4 days then 1 tablet for 3 days. 12/10/15  Yes Belkys A Regalado, MD  senna-docusate (SENOKOT-S) 8.6-50 MG tablet Take 1 tablet by mouth 2 (two) times daily. 12/10/15  Yes Elmarie Shiley, MD    Family History  Family History  Problem Relation Age of Onset  . Hypertension Father     Social History  Social History   Social History  . Marital status: Legally Separated    Spouse name: N/A  . Number of children: N/A  . Years of education: N/A   Occupational History  . Not on file.   Social History Main Topics  . Smoking status: Never Smoker  . Smokeless tobacco: Never Used  . Alcohol use Yes     Comment: occasional  . Drug use: No  . Sexual activity: Not on file   Other Topics Concern  . Not on file   Social History Narrative  . No narrative on file     Review of Systems General:  No chills, fever, night sweats or weight changes.  Cardiovascular:  +chest pain, + dyspnea on exertion, - edema, orthopnea, palpitations, paroxysmal nocturnal dyspnea. Dermatological: No rash, lesions/masses Respiratory: No cough, + dyspnea Urologic: No hematuria, dysuria Abdominal:   No nausea, vomiting, diarrhea, bright red blood per rectum, melena, or  hematemesis Neurologic:  No visual changes, wkns, changes in mental status. All other systems reviewed and are otherwise negative except as noted above.  Physical Exam  Blood pressure 126/93, pulse 78, temperature 98.2 F (36.8 C), temperature source Oral, resp. rate 26, height 5' 8"  (1.727 m), weight 271 lb (122.9 kg), SpO2 99 %.  General: Pleasant, NAD, moderately obese  Psych: Normal affect. Neuro: Alert and oriented X 3. Moves all extremities spontaneously. HEENT: Normal  Neck: Supple without bruits or JVD. Lungs:  Resp regular and unlabored, CTA. Heart: RRR no s3, s4, or murmurs. Abdomen: Soft, non-tender, non-distended, BS + x 4.  Extremities: No clubbing, cyanosis or edema. DP/PT/Radials 2+ and equal bilaterally.  Labs  Troponin [facility] of Care Test)  Recent Labs  12/17/15 1111  TROPIPOC 0.00   No results for input(s): CKTOTAL, CKMB, TROPONINI in the last 72 hours. Lab Results  Component Value Date   WBC 13.9 (H) 12/17/2015   HGB 10.8 (L) 12/17/2015   HCT 34.2 (L) 12/17/2015   MCV 88.8 12/17/2015   PLT 352 12/17/2015     Recent Labs Lab 12/17/15 1052  NA 136  K 4.1  CL 98*  CO2 31  BUN 14  CREATININE 0.75  CALCIUM 9.0  GLUCOSE 130*   No results found for: CHOL, HDL, LDLCALC, TRIG No results found for: DDIMER   Radiology/Studies  Dg Chest 2 View  Result Date: 12/17/2015 CLINICAL DATA:  Patient with chest pressure and tightness. Recent history of pericarditis. EXAM: CHEST  2 VIEW COMPARISON:  Chest radiograph 12/05/2015. FINDINGS: Low lung volumes. Stable enlarged cardiac and mediastinal contours. Persistent consolidation within the left lower hemi thorax. Small left pleural effusion. No pneumothorax. Thoracic spine degenerative changes. IMPRESSION: Consolidation within the left lower hemi thorax with associated pleural fluid. Infection not excluded. Mild cardiomegaly. Electronically Signed   By: Lovey Newcomer M.D.   On: 12/17/2015 12:01   Ct Chest W  Contrast  Result Date: 11/22/2015 CLINICAL DATA:  45 year old female with chest pain and shortness of breath. EXAM: CT CHEST WITH CONTRAST TECHNIQUE: Multidetector CT imaging of the chest was performed during intravenous contrast administration. CONTRAST:  11m ISOVUE-300 IOPAMIDOL (ISOVUE-300) INJECTION 61% COMPARISON:  None. FINDINGS: Cardiovascular: A moderate to large pericardial effusion is noted. Heart is otherwise unremarkable. There is no evidence of thoracic aortic aneurysm. Mediastinum/Nodes: No enlarged lymph nodes or mediastinal mass. Lungs/Pleura: Mild bibasilar atelectasis noted. There is no evidence of airspace disease, consolidation, nodule, mass, pleural effusion or pneumothorax. Upper Abdomen: Unremarkable Musculoskeletal: No acute or suspicious abnormalities. IMPRESSION: Moderate to large pericardial effusion. These results will be called to the ordering clinician or representative by the Radiologist Assistant, and communication documented in the PACS or zVision Dashboard. Electronically Signed   By: JMargarette CanadaM.D.   On: 11/22/2015 17:45   Ct Angio Chest Pe W/cm &/or Wo Cm  Result Date: 12/05/2015 CLINICAL DATA:  Chest pain and shortness of Breath starting this morning. Recent hospitalization for pericardial effusion. EXAM: CT ANGIOGRAPHY CHEST WITH CONTRAST TECHNIQUE: Multidetector CT imaging of the chest was performed using the standard protocol during bolus administration of intravenous contrast. Multiplanar CT image reconstructions and MIPs were obtained to evaluate the vascular anatomy. CONTRAST:  80 cc Isovue 370 COMPARISON:  12/05/2015 and 11/22/2015 FINDINGS: Body habitus and motion artifact reduces exam sensitivity and specificity. Cardiovascular: Mildly dilute contrast bolus with mild heterogeneity of the pulmonary arterial contrast. Reduced sensitivity for subsegmental emboli. No filling defect is identified in the pulmonary arterial tree to suggest pulmonary embolus. No acute  aortic findings. Mild cardiomegaly and moderate pericardial effusion, reduced from the large pericardial effusion that was present on 11/22/2015. Mediastinum/Nodes: Is no pathologic adenopathy. Lungs/Pleura: Small bilateral pleural effusions. Mild dependent atelectasis in both lower lobes. Upper Abdomen: 1.3 cm hypodense lesion in the upper pole the left kidney, previously approximately 1.0 cm on 05/29/2009, nonspecific. Musculoskeletal: Thoracic spondylosis. Review of the MIP images confirms the above findings. IMPRESSION: 1. No embolus identified. Reduced sensitivity is specially for smaller emboli due to body habitus, motion artifact, and somewhat heterogeneous contrast bolus in the pulmonary arteries. 2. Moderate pericardial effusion, but reduced from the prominent pericardial effusion that was present on 11/22/2015. 3. Mild cardiomegaly. 4. Small bilateral pleural effusions with dependent atelectasis in both lower lobes. 5. 1.3 cm nonspecific hypodense lesion in the upper pole the left kidney. Back in 2011 this measured about 1.0 cm, accordingly this is most likely benign, but technically nonspecific. Electronically Signed   By: WVan ClinesM.D.   On: 12/05/2015 14:03   Dg Chest Portable 1 View  Result Date: 12/05/2015 CLINICAL DATA:  Right chest pain, shortness of breath x3  days EXAM: PORTABLE CHEST 1 VIEW COMPARISON:  CT chest dated 11/22/2015 FINDINGS: Low lung volumes. Increased interstitial markings, favoring vascular crowding. Mild patchy bilateral lower lobe opacities, likely atelectasis. Mild cardiomegaly. IMPRESSION: Low lung volumes. Mild patchy bilateral lower lobe opacities, likely atelectasis. Electronically Signed   By: Julian Hy M.D.   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 EXAM: CARDIAC MRI TECHNIQUE: The patient was scanned on a 1.5  Tesla GE magnet. A dedicated cardiac coil was used. Functional imaging was done using Fiesta sequences. 2,3, and 4 chamber views were done to assess for RWMA's. Modified Simpson's rule using a short axis stack was used to calculate an ejection fraction on a dedicated work Conservation officer, nature. The patient received 28 cc of Multihance. After 10 minutes inversion recovery sequences were used to assess for infiltration and scar tissue. CONTRAST:  28 cc  of Multihance FINDINGS: 1. Normal left ventricular size, thickness and systolic function (LVEF = 57%) with no regional wall motion abnormalities. LVEDD; 54 mm LVESD:  38 mm LVEDV:  157 ml LVESV:  76 ml SV:  89 ml CO:  7.0 L/min 2. Normal right ventricular size, thickness and systolic function (RVEF = 55%) with no regional wall motion abnormalities. 3.  Mild left atrial dilatation. 4.  Mild mitral and trivial tricuspid regurgitation. 5. Normal size of the aortic root, ascending aorta and pulmonary artery. 6. There is mild amount of pericardial effusion predominantly in the proximity to the right ventricle. 7. There is thickening of the pericardium focally up to 8 mm, and significant circumferential late gadolinium enhancement of the pericardium and epicardium. There is IVC dilatation with no respiratory variation. There is also double bounce of the interventricular septum in diastole. IMPRESSION: 1. Normal left ventricular size, thickness and systolic function (LVEF = 57%) with no regional wall motion abnormalities. 2. Normal right ventricular size, thickness and systolic function (RVEF = 55%) with no regional wall motion abnormalities. 3.  Mild left atrial dilatation. 4.  Mild mitral and trivial tricuspid regurgitation. 5. There is mild amount of pericardial effusion predominantly in the proximity to the right ventricle. 6. There is thickening of the pericardium focally up to 8 mm, and significant circumferential late gadolinium enhancement of the pericardium  and epicardium. An IVC dilatation with no respiratory variation is seen. There is also double bounce of the interventricular septum in diastole. 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 A right sided cardiac cath is recommended. Ena Dawley Electronically Signed   By: Ena Dawley   On: 12/06/2015 15:04    ECG  NSR. 81 bpm   ASSESSMENT AND PLAN  1. Pericarditis: worsening symptoms of pleuritic + positional CP, worse in supine position. No improvement with current therapies. Currently on Steroid taper, ibuprofen and colchicine.  We will admit and treat with IV solumedrol, 80 mg BID. Will restart higher taper dose in the am. We will repeat a ESR and CRP to assess trend from previous levels. Continue NSAIDs + colchicine. Per Dr. Percival Spanish, ok to give PRN dilaudid for pain, PRN.   2. Pericardial Effusion: initial echo 8/217 showed moderate to large pericardial effusion w/o tamponade physiology. Repeat echo 9/5 showed a small to moderate effusion w/o evidence of hemodynamic compromise. Cardiac MRI  showed normal EF 57%, RVEF 55%, mild amount of pericardial effusion predominantly in the proximity of  the right ventricle, and the pericardial focally up to 8 mm, and a significant circumferential late gadolinium enhancement of the pericardium and epicardium. 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  Plan was for continued treatment of pericarditis with f/u MRI vs RHC in 4 weeks. Given worsening symptoms, may need to repeat limited echo to insure no progression of pericardial effusion. She is currently hemodynamically stable. Will defer repeat echo to MD.   MD to assess and will provide further recommendations.   Signed, Lyda Jester, PA-C 12/17/2015, 3:58 PM   History and all data above reviewed.  Patient examined.  I agree with the findings as above.  The patient returns with chest pain similar to her  previous pericarditis.  The only difference seems to be that this is occurring on the left side rather than the right.  It is reproduced with breathing, position and palpation.  10/10.  This has progressed since Friday.  When she left the hospital on Monday the pain was improved.  She has had no cough, fevers or chills.  She has been walking her puppy everyday.  She has been on a steroid taper.  EKG today is not acute.   The patient exam reveals COR:RRR, no rub  ,  Lungs: Clear  ,  Abd: Positive bowel sounds, no rebound no guarding, Ext no edema.  VS:  No evidence of pulsus paradox on BP.  Neck Mild JVD  .  All available labs, radiology testing, previous records reviewed. Agree with documented assessment and plan. CHEST PAIN:  I suspect that this recurrent/continued pericarditis.  I will check a CRP and ESR as these had been coming down.  However, for now she needs pain management and I will try to afford this through increased steroids.  I will consider a repeat limited echo.  However, her pericardial effusion was improved 12 days ago on echo.  She does have some suggestion of constriction on imaging but not a clinical diagnosis of this.    Jeneen Rinks Derrek Puff  5:14 PM  12/17/2015

## 2015-12-17 NOTE — ED Provider Notes (Signed)
Bedside cardiac ultrasound performed by Dr. Dalene SeltzerSchlossman which revealed minimal pericardial effusion. [facility].  Dr.Hochrein with cardiology consulted pt in ED and will admit to his service. Pt remains hemodynamically stable and awaiting bed placement.   Lester KinsmanSamantha Tripp HalifaxDowless, PA-C 12/17/15 1856    Alvira MondayErin Schlossman, MD 12/21/15 1042

## 2015-12-18 LAB — BASIC METABOLIC PANEL
Anion gap: 9 (ref 5–15)
BUN: 17 mg/dL (ref 6–20)
CO2: 29 mmol/L (ref 22–32)
Calcium: 9 mg/dL (ref 8.9–10.3)
Chloride: 99 mmol/L — ABNORMAL LOW (ref 101–111)
Creatinine, Ser: 0.89 mg/dL (ref 0.44–1.00)
GFR calc Af Amer: >60 mL/min (ref >60)
GFR calc non Af Amer: >60 mL/min (ref >60)
Glucose, Bld: 175 mg/dL — ABNORMAL HIGH (ref 65–99)
Potassium: 4 mmol/L (ref 3.5–5.1)
Sodium: 137 mmol/L (ref 135–145)

## 2015-12-18 LAB — CBC
HCT: 33.9 % — ABNORMAL LOW (ref 36.0–46.0)
Hemoglobin: 10.6 g/dL — ABNORMAL LOW (ref 12.0–15.0)
MCH: 28 pg (ref 26.0–34.0)
MCHC: 31.3 g/dL (ref 30.0–36.0)
MCV: 89.4 fL (ref 78.0–100.0)
Platelets: 370 10*3/uL (ref 150–400)
RBC: 3.79 MIL/uL — ABNORMAL LOW (ref 3.87–5.11)
RDW: 15.7 % — ABNORMAL HIGH (ref 11.5–15.5)
WBC: 12.3 10*3/uL — ABNORMAL HIGH (ref 4.0–10.5)

## 2015-12-18 MED ORDER — ACETAMINOPHEN 325 MG PO TABS
650.0000 mg | ORAL_TABLET | Freq: Four times a day (QID) | ORAL | Status: DC
  Administered 2015-12-18 – 2015-12-21 (×11): 650 mg via ORAL
  Filled 2015-12-18 (×12): qty 2

## 2015-12-18 MED ORDER — PREDNISONE 20 MG PO TABS
80.0000 mg | ORAL_TABLET | Freq: Every day | ORAL | Status: DC
  Administered 2015-12-19 – 2015-12-21 (×3): 80 mg via ORAL
  Filled 2015-12-18 (×3): qty 4

## 2015-12-18 NOTE — Plan of Care (Signed)
Problem: Activity: Goal: Risk for activity intolerance will decrease Outcome: Progressing Patient ambulated with stand-by assistance around the unit 3x with out any complications. Heart rate did increase 110-115s but patient denied shob or any chest pain. Ambulation tolerated well. Patient states she is feeling better. Will continue to monitor.

## 2015-12-18 NOTE — Progress Notes (Signed)
RN rounded on Pt and Pt mentioned family wanted an update on plan of care and medication regimen. RN spoke with Pt sister French Ana(Tracy) on the phone. Pt sister wanted more detailed information than what RN can give. MD rounding please call sister at [phone] .

## 2015-12-18 NOTE — Progress Notes (Signed)
Patient Name: Alicia Wood Date of Encounter: 12/18/2015  Hospital Problem List     Active Problems:   Pericarditis    Subjective   Feeling better this morning.  Inpatient Medications    . aspirin  81 mg Oral Daily  . colchicine  0.6 mg Oral BID  . ibuprofen  600 mg Oral TID  . methylPREDNISolone (SOLU-MEDROL) injection  80 mg Intravenous Q12H  . pantoprazole  40 mg Oral Daily    Vital Signs    Vitals:   12/17/15 1945 12/17/15 2040 12/18/15 0225 12/18/15 0500  BP: 122/85 127/85  122/81  Pulse: 79 77  70  Resp: 13 18    Temp:  98.5 F (36.9 C)  98.2 F (36.8 C)  TempSrc:  Oral  Oral  SpO2: 97% 97% 95% 97%  Weight:  270 lb 4.8 oz (122.6 kg)  271 lb 1.6 oz (123 kg)  Height:  5\' 8"  (1.727 m)      Intake/Output Summary (Last 24 hours) at 12/18/15 0857 Last data filed at 12/17/15 2040  Gross per 24 hour  Intake              740 ml  Output                0 ml  Net              740 ml   Filed Weights   12/17/15 1050 12/17/15 2040 12/18/15 0500  Weight: 271 lb (122.9 kg) 270 lb 4.8 oz (122.6 kg) 271 lb 1.6 oz (123 kg)    Physical Exam    General: Pleasant AA female, NAD. Neuro: Alert and oriented X 3. Moves all extremities spontaneously. Psych: Normal affect. HEENT:  Normal  Neck: Supple without bruits or JVD. Lungs:  Resp regular and unlabored, CTA.  Heart: RRR no s3, s4, or murmurs. Abdomen: Soft, non-tender, non-distended, BS + x 4.  Extremities: No clubbing, cyanosis or edema. DP/PT/Radials 2+ and equal bilaterally.  Labs    CBC  Recent Labs  12/17/15 1052 12/18/15 0355  WBC 13.9* 12.3*  HGB 10.8* 10.6*  HCT 34.2* 33.9*  MCV 88.8 89.4  PLT 352 370   Basic Metabolic Panel  Recent Labs  12/17/15 1052 12/18/15 0355  NA 136 137  K 4.1 4.0  CL 98* 99*  CO2 31 29  GLUCOSE 130* 175*  BUN 14 17  CREATININE 0.75 0.89  CALCIUM 9.0 9.0   Liver Function Tests No results for input(s): AST, ALT, ALKPHOS, BILITOT, PROT, ALBUMIN in the last  72 hours. No results for input(s): LIPASE, AMYLASE in the last 72 hours. Cardiac Enzymes No results for input(s): CKTOTAL, CKMB, CKMBINDEX, TROPONINI in the last 72 hours. BNP Invalid input(s): POCBNP D-Dimer No results for input(s): DDIMER in the last 72 hours. Hemoglobin A1C No results for input(s): HGBA1C in the last 72 hours. Fasting Lipid Panel No results for input(s): CHOL, HDL, LDLCALC, TRIG, CHOLHDL, LDLDIRECT in the last 72 hours. Thyroid Function Tests No results for input(s): TSH, T4TOTAL, T3FREE, THYROIDAB in the last 72 hours.  Invalid input(s): FREET3  Telemetry    SR  ECG    N/A  Radiology    Dg Chest 2 View  Result Date: 12/17/2015 CLINICAL DATA:  Patient with chest pressure and tightness. Recent history of pericarditis. EXAM: CHEST  2 VIEW COMPARISON:  Chest radiograph 12/05/2015. FINDINGS: Low lung volumes. Stable enlarged cardiac and mediastinal contours. Persistent consolidation within the left lower hemi thorax. Small left  pleural effusion. No pneumothorax. Thoracic spine degenerative changes. IMPRESSION: Consolidation within the left lower hemi thorax with associated pleural fluid. Infection not excluded. Mild cardiomegaly. Electronically Signed   By: Annia Belt M.D.   On: 12/17/2015 12:01    Assessment & Plan    45 y/o female with recent diagnosis of pericarditis and moderate to large pericardial effusion w/o tamponade physiology, multiple admissions x 2 in the past month, improvement on last echo to mild effusion, presenting back with worsening recurrent CP and dyspnea.  1. Pericarditis: worsening symptoms of pleuritic + positional CP, worse in supine position. No improvement with current therapies. Currently on Steroid taper, ibuprofen and colchicine. She was started on  IV solumedrol, 80 mg BID on admission. Recheck of CRP and Sed rate show increase in values from last admission. Will d/c solumedrol this morning and restart steroid taper at 80mg  daily.  May be beneficial to split dose?  2. Pericardial Effusion: initial echo 8/217 showed moderate to large pericardial effusion w/o tamponade physiology. Repeat echo 9/5 showed a small to moderate effusion w/o evidence of hemodynamic compromise. Cardiac MRI  showed normal EF 57%, RVEF 55%, mild amount of pericardial effusion predominantly in the proximity of the right ventricle, and the pericardial focally up to 8 mm, and a significant circumferential late gadolinium enhancement of the pericardium and epicardium. 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  Plan was for continued treatment of pericarditis with f/u MRI vs RHC in 4 weeks.  --Remains hemodynamically stable this morning. Discussed with Dr. [name] and will repeat limited echo.   Signed, Laverda Page NP-C Pager [phone]  History and all data above reviewed.  Patient examined.  I agree with the findings as above.  Feels better this morning.  The patient exam reveals COR:RRR, no rub  ,  Lungs: Clear  ,  Abd: Positive bowel sounds, no rebound no guarding, Ext No edema  .  All available labs, radiology testing, previous records reviewed. Agree with documented assessment and plan. Pericarditis.  She responds well to the IV steroid.  She only required two doses of the dilaudid.  PO steroid today with a slower taper at home.  Pain management will be important and I will try to get her in a pain clinic.  Check a limited echo today.    Fayrene Fearing Lliam Hoh  11:26 AM  12/18/2015

## 2015-12-19 ENCOUNTER — Inpatient Hospital Stay (HOSPITAL_COMMUNITY): Payer: 59

## 2015-12-19 DIAGNOSIS — I319 Disease of pericardium, unspecified: Secondary | ICD-10-CM

## 2015-12-19 LAB — ECHOCARDIOGRAM LIMITED
Height: 68 in
Weight: 4350.4 oz

## 2015-12-19 NOTE — Progress Notes (Signed)
Patient Name: Alicia Wood Date of Encounter: 12/19/2015  Hospital Problem List     Active Problems:   Pericarditis    Subjective   Reports some right sided upper abd discomfort after taking her PO meds this morning.   Inpatient Medications    . acetaminophen  650 mg Oral Q6H  . aspirin  81 mg Oral Daily  . colchicine  0.6 mg Oral BID  . ibuprofen  600 mg Oral TID  . pantoprazole  40 mg Oral Daily  . predniSONE  80 mg Oral Q breakfast    Vital Signs    Vitals:   12/18/15 0500 12/18/15 1323 12/18/15 2015 12/19/15 0443  BP: 122/81 121/88 121/83 121/74  Pulse: 70 84 70 65  Resp:  18  18  Temp: 98.2 F (36.8 C) 98 F (36.7 C) 98.3 F (36.8 C) 98 F (36.7 C)  TempSrc: Oral Oral Oral Oral  SpO2: 97% 99% 99% 99%  Weight: 271 lb 1.6 oz (123 kg)   271 lb 14.4 oz (123.3 kg)  Height:        Intake/Output Summary (Last 24 hours) at 12/19/15 0945 Last data filed at 12/18/15 2052  Gross per 24 hour  Intake              660 ml  Output                0 ml  Net              660 ml   Filed Weights   12/17/15 2040 12/18/15 0500 12/19/15 0443  Weight: 270 lb 4.8 oz (122.6 kg) 271 lb 1.6 oz (123 kg) 271 lb 14.4 oz (123.3 kg)    Physical Exam    General: Pleasant AA female, NAD. Neuro: Alert and oriented X 3. Moves all extremities spontaneously. Psych: Normal affect. HEENT:  Normal  Neck: Supple without bruits or JVD. Lungs:  Resp regular and unlabored, CTA.  Heart: RRR no s3, s4, or murmurs. Abdomen: Soft, non-tender, non-distended, BS + x 4.  Extremities: No clubbing, cyanosis or edema. DP/PT/Radials 2+ and equal bilaterally.  Labs    CBC  Recent Labs  12/17/15 1052 12/18/15 0355  WBC 13.9* 12.3*  HGB 10.8* 10.6*  HCT 34.2* 33.9*  MCV 88.8 89.4  PLT 352 370   Basic Metabolic Panel  Recent Labs  12/17/15 1052 12/18/15 0355  NA 136 137  K 4.1 4.0  CL 98* 99*  CO2 31 29  GLUCOSE 130* 175*  BUN 14 17  CREATININE 0.75 0.89  CALCIUM 9.0 9.0    Liver Function Tests No results for input(s): AST, ALT, ALKPHOS, BILITOT, PROT, ALBUMIN in the last 72 hours. No results for input(s): LIPASE, AMYLASE in the last 72 hours. Cardiac Enzymes No results for input(s): CKTOTAL, CKMB, CKMBINDEX, TROPONINI in the last 72 hours. BNP Invalid input(s): POCBNP D-Dimer No results for input(s): DDIMER in the last 72 hours. Hemoglobin A1C No results for input(s): HGBA1C in the last 72 hours. Fasting Lipid Panel No results for input(s): CHOL, HDL, LDLCALC, TRIG, CHOLHDL, LDLDIRECT in the last 72 hours. Thyroid Function Tests No results for input(s): TSH, T4TOTAL, T3FREE, THYROIDAB in the last 72 hours.  Invalid input(s): FREET3  Telemetry    SR  ECG    N/A  Radiology      Assessment & Plan    45 y/o female with recent diagnosis of pericarditis and moderate to large pericardial effusion w/o tamponade physiology, multiple admissions x  2 in the past month, improvement on last echo to mild effusion, presenting back with worsening recurrent CP and dyspnea.  1. Pericarditis: worsening symptoms of pleuritic + positional CP, worse in supine position. No improvement with current therapies. Currently on Steroid taper, ibuprofen and colchicine at home. She was started on  IV solumedrol, 80 mg BID on admission. Recheck of CRP and Sed rate show increase in values from last admission.  -- Started back on prednisone 80mg  daily yesterday with plans for slow taper at discharge. Reports she walked in the hallway yesterday and did well. This morning c/o right upper abd discomfort/pressure that started shortly after taking her PO meds this morning, "states feels like the pills are stuck". Denies any hx of dysphagia, or esophageal issues. Question GI component? -- Dr. Antoine PocheHochrein discussed the option of trying to get her placed with a pain clinic for further pain management.   2. Pericardial Effusion: initial echo 8/217 showed moderate to large pericardial  effusion w/o tamponade physiology. Repeat echo 9/5 showed a small to moderate effusion w/o evidence of hemodynamic compromise. Cardiac MRI  showed normal EF 57%, RVEF 55%, mild amount of pericardial effusion predominantly in the proximity of the right ventricle, and the pericardial focally up to 8 mm, and a significant circumferential late gadolinium enhancement of the pericardium and epicardium. 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  Plan was for continued treatment of pericarditis with f/u MRI vs RHC in 4 weeks.  --Remains hemodynamically stable this morning. Limited Echo ordered yesterday, pending this morning.   Signed, Laverda PageLindsay Roberts NP-C Pager [phone]  History and all data above reviewed.  Patient examined. Recurrent pain today and more uncomfortable than she was yesterday.    I agree with the findings as above.  The patient exam reveals COR:RRR  ,  Lungs: Clear  ,  Abd: Positive bowel sounds, no rebound no guarding, Ext No edema  .  All available labs, radiology testing, previous records reviewed. Agree with documented assessment and plan. Chest pain:  Worse today.  Need to get echo.  Continue with pain management.  Might also need a GI evaluation.  I will keep NPO after MN.  If she is not improved I will ask GI to see in the AM.    Rollene RotundaJames Jaymarie Yeakel  11:44 AM  12/19/2015

## 2015-12-19 NOTE — Progress Notes (Signed)
  Echocardiogram 2D Echocardiogram limited has been performed.  Alicia Wood, [name] 12/19/2015, 3:35 PM

## 2015-12-19 NOTE — Consult Note (Signed)
   [facility]   12/19/2015  Octavio Mannsamela Alicia Wood 02/12/1971 [id number]   Spoke with Ms. Alicia Wood at bedside on behalf of [employer] [facility] [facility] Care Management program for [facility] employees/dependents with [facility] insurance. Discussed readmission. States she continued to have chest pain that was unbearable. Spoke with inpatient RNCM and nursing who indicate [name] will have a pain clinic referral for outpatient pain management. [name] states she is hopeful that the pain clinic will help with her pain. [name] states she still wants follow up regarding nutrition. Made inpatient RNCM aware. To have nutrition consult. Also provided Ms.Alicia Wood with a dietary brochure about low sodium foods.   Reminded [name] to reschedule her Primary Care MD appointment as she says her appointment was cancelled on Monday due to coming to the ED. Also she endorses her next Cardiologist appointment is Oct 23rd. She denies having any trouble with obtaining medications or with transportation. States her main problem has been pain. Discussed that she will receive post hospital discharge call. Confirmed best contact number as [phone]. Appreciative of visit.  Raiford NobleAtika Aubriauna Riner, MSN-Ed, RN,BSN [facility] Care Management Hospital Liaison [phone]

## 2015-12-19 NOTE — Care Management Note (Addendum)
Case Management Note  Patient Details  Name: Alicia Wood MRN: [id number] Date of Birth: 09/05/1970  Subjective/Objective:  Pt presented for worsening chest pain and dyspnea. Pt was recently diagnosed with pericarditis and moderate to large pericardial effusion. Pt previously d/c with Rx for Colchicine.                   Action/Plan: CM received referral for Pain Management Clinic: CM did call [facility] and referral was faxed over. Per Clinic it would take up to 10 business days before they can schedule the patient. Office to call to schedule an appointment. (New Patient Referral Process). Nutrition to consult before d/c. [facility] to f/u with pt once she is d/c. No further needs from CM at this time.   Expected Discharge Date:                  Expected Discharge Plan:  Home/Self Care  In-House Referral:  NA  Discharge planning Services  CM Consult  Post Acute Care Choice:  NA Choice offered to:  NA  DME Arranged:  N/A DME Agency:  NA  HH Arranged:  NA HH Agency:  NA  Status of Service:  Completed, signed off  If discussed at Long Length of Stay Meetings, dates discussed:    Additional Comments: 1112 12-21-15 Tomi BambergerBrenda Graves-Bigelow, RN,BSN [phone] CM did speak with [facility] CM Atika and  called [facility]- she was told that [facility] is not in network. Per representative she had tried to call. Not sure how CM was not notified- Number was on the face sheet. [facility] Atika and Unit CM reached out to [name] to see if an appointment could be scheduled. Per [name] they except [employer] insurance and it may take up to a week to a month for decision for an appointment. Atika [facility] CM did ask for Peer to Peer to be done by physician to see if an appointment could be scheduled any earlier. Unit CM did fax information to [name]. Unit CM also called to [facility] to see if insurance is in network and if taking new  patients. Awaiting return phone call. CM will continue to monitor for additional needs.  Gala LewandowskyGraves-Bigelow, Magdaline Zollars Kaye, RN 12/19/2015, 10:40 AM

## 2015-12-19 NOTE — Progress Notes (Signed)
Nutrition Education Note  RD consulted for nutrition education regarding a Heart Healthy diet.   RD provided "Heart Healthy Nutrition Therapy" handout from the Academy of [employer] and [employer]etics. Reviewed patient's dietary recall. Provided examples on ways to decrease sodium and fat intake in diet. Discouraged intake of processed foods and use of salt shaker. Encouraged fresh fruits and vegetables as well as whole grain sources of carbohydrates to maximize fiber intake. Teach back method used.  Expect poor compliance. Patient did not seem very interested in the information provided by RD.  Body mass index is 41.34 kg/m. Pt meets criteria for class 3, extreme/morbid obesity based on current BMI.  Current diet order is heart healthy, patient is consuming approximately 100% of meals at this time. Labs and medications reviewed. No further nutrition interventions warranted at this time. RD contact information provided. If additional nutrition issues arise, please re-consult RD.  Alicia CourtsKimberly Zoe Wood, RD, LDN, CNSC Pager [phone] After Hours Pager [phone]

## 2015-12-20 LAB — C-REACTIVE PROTEIN: CRP: 8.3 mg/dL — ABNORMAL HIGH (ref <1.0)

## 2015-12-20 LAB — SEDIMENTATION RATE: Sed Rate: 42 mm/hr — ABNORMAL HIGH (ref 0–22)

## 2015-12-20 MED ORDER — FLUCONAZOLE IN SODIUM CHLORIDE 200-0.9 MG/100ML-% IV SOLN
200.0000 mg | INTRAVENOUS | Status: DC
  Administered 2015-12-20: 200 mg via INTRAVENOUS
  Filled 2015-12-20 (×2): qty 100

## 2015-12-20 MED ORDER — PANTOPRAZOLE SODIUM 40 MG PO TBEC
40.0000 mg | DELAYED_RELEASE_TABLET | Freq: Two times a day (BID) | ORAL | Status: DC
  Administered 2015-12-20 – 2015-12-21 (×3): 40 mg via ORAL
  Filled 2015-12-20 (×3): qty 1

## 2015-12-20 NOTE — Progress Notes (Signed)
 Patient Name: Alicia Wood Date of Encounter: 12/20/2015  Hospital Problem List     Active Problems:   Pericarditis    Patient Profile     45 y/o female with recent diagnosis of pericarditis and moderate to large pericardial effusion w/o tamponade physiology, multiple admissions x 2 in the past month, improvement on last echo to mild effusion, presenting back with worsening recurrent CP and dyspnea.   Subjective   Still reporting continued pain.  She was able to eat last night.  Ambulates in the room.    Inpatient Medications    . acetaminophen  650 mg Oral Q6H  . aspirin  81 mg Oral Daily  . colchicine  0.6 mg Oral BID  . fluconazole (DIFLUCAN) IV  200 mg Intravenous Q24H  . ibuprofen  600 mg Oral TID  . pantoprazole  40 mg Oral BID  . predniSONE  80 mg Oral Q breakfast    Vital Signs    Vitals:   12/19/15 0443 12/19/15 1545 12/19/15 2032 12/20/15 0459  BP: 121/74 124/84 124/77 119/64  Pulse: 65 67 69 83  Resp: 18 18 20   Temp: 98 F (36.7 C) 97.9 F (36.6 C) 98.3 F (36.8 C) 98.3 F (36.8 C)  TempSrc: Oral Oral Oral Oral  SpO2: 99% 93% 97% 99%  Weight: 271 lb 14.4 oz (123.3 kg)   272 lb 8 oz (123.6 kg)  Height:        Intake/Output Summary (Last 24 hours) at 12/20/15 1119 Last data filed at 12/20/15 0800  Gross per 24 hour  Intake              720 ml  Output                0 ml  Net              720 ml   Filed Weights   12/18/15 0500 12/19/15 0443 12/20/15 0459  Weight: 271 lb 1.6 oz (123 kg) 271 lb 14.4 oz (123.3 kg) 272 lb 8 oz (123.6 kg)    Physical Exam    GEN: Well nourished, well developed, in  no acute distress.  Neck: Supple, no JVD, carotid bruits, or masses. Cardiac: RRR, no rubs, or gallops. No clubbing, cyanosis, no edema.  Radials/DP/PT 2+ and equal bilaterally.  Respiratory:  Respirations  regular and unlabored, clear to auscultation bilaterally. GI: Soft, nontender, nondistended, BS + x 4. Neuro:  Strength and sensation are  intact.   Labs    CBC  Recent Labs  12/18/15 0355  WBC 12.3*  HGB 10.6*  HCT 33.9*  MCV 89.4  PLT 370   Basic Metabolic Panel  Recent Labs  12/18/15 0355  NA 137  K 4.0  CL 99*  CO2 29  GLUCOSE 175*  BUN 17  CREATININE 0.89  CALCIUM 9.0   Liver Function Tests No results for input(s): AST, ALT, ALKPHOS, BILITOT, PROT, ALBUMIN in the last 72 hours. No results for input(s): LIPASE, AMYLASE in the last 72 hours. Cardiac Enzymes No results for input(s): CKTOTAL, CKMB, CKMBINDEX, TROPONINI in the last 72 hours. BNP Invalid input(s): POCBNP D-Dimer No results for input(s): DDIMER in the last 72 hours. Hemoglobin A1C No results for input(s): HGBA1C in the last 72 hours. Fasting Lipid Panel No results for input(s): CHOL, HDL, LDLCALC, TRIG, CHOLHDL, LDLDIRECT in the last 72 hours. Thyroid Function Tests No results for input(s): TSH, T4TOTAL, T3FREE, THYROIDAB in the last 72 hours.  Invalid input(s):   FREET3  Telemetry    NSR  ECG    NA  Radiology    Dg Chest 2 View  Result Date: 12/17/2015 CLINICAL DATA:  Patient with chest pressure and tightness. Recent history of pericarditis. EXAM: CHEST  2 VIEW COMPARISON:  Chest radiograph 12/05/2015. FINDINGS: Low lung volumes. Stable enlarged cardiac and mediastinal contours. Persistent consolidation within the left lower hemi thorax. Small left pleural effusion. No pneumothorax. Thoracic spine degenerative changes. IMPRESSION: Consolidation within the left lower hemi thorax with associated pleural fluid. Infection not excluded. Mild cardiomegaly. Electronically Signed   By: Drew  Davis M.D.   On: 12/17/2015 12:01   Ct Chest W Contrast  Result Date: 11/22/2015 CLINICAL DATA:  45-year-old female with chest pain and shortness of breath. EXAM: CT CHEST WITH CONTRAST TECHNIQUE: Multidetector CT imaging of the chest was performed during intravenous contrast administration. CONTRAST:  75mL ISOVUE-300 IOPAMIDOL (ISOVUE-300)  INJECTION 61% COMPARISON:  None. FINDINGS: Cardiovascular: A moderate to large pericardial effusion is noted. Heart is otherwise unremarkable. There is no evidence of thoracic aortic aneurysm. Mediastinum/Nodes: No enlarged lymph nodes or mediastinal mass. Lungs/Pleura: Mild bibasilar atelectasis noted. There is no evidence of airspace disease, consolidation, nodule, mass, pleural effusion or pneumothorax. Upper Abdomen: Unremarkable Musculoskeletal: No acute or suspicious abnormalities. IMPRESSION: Moderate to large pericardial effusion. These results will be called to the ordering clinician or representative by the Radiologist Assistant, and communication documented in the PACS or zVision Dashboard. Electronically Signed   By: Jeffrey  Hu M.D.   On: 11/22/2015 17:45   Ct Angio Chest Pe W/cm &/or Wo Cm  Result Date: 12/05/2015 CLINICAL DATA:  Chest pain and shortness of Breath starting this morning. Recent hospitalization for pericardial effusion. EXAM: CT ANGIOGRAPHY CHEST WITH CONTRAST TECHNIQUE: Multidetector CT imaging of the chest was performed using the standard protocol during bolus administration of intravenous contrast. Multiplanar CT image reconstructions and MIPs were obtained to evaluate the vascular anatomy. CONTRAST:  80 cc Isovue 370 COMPARISON:  12/05/2015 and 11/22/2015 FINDINGS: Body habitus and motion artifact reduces exam sensitivity and specificity. Cardiovascular: Mildly dilute contrast bolus with mild heterogeneity of the pulmonary arterial contrast. Reduced sensitivity for subsegmental emboli. No filling defect is identified in the pulmonary arterial tree to suggest pulmonary embolus. No acute aortic findings. Mild cardiomegaly and moderate pericardial effusion, reduced from the large pericardial effusion that was present on 11/22/2015. Mediastinum/Nodes: Is no pathologic adenopathy. Lungs/Pleura: Small bilateral pleural effusions. Mild dependent atelectasis in both lower lobes. Upper  Abdomen: 1.3 cm hypodense lesion in the upper pole the left kidney, previously approximately 1.0 cm on 05/29/2009, nonspecific. Musculoskeletal: Thoracic spondylosis. Review of the MIP images confirms the above findings. IMPRESSION: 1. No embolus identified. Reduced sensitivity is specially for smaller emboli due to body habitus, motion artifact, and somewhat heterogeneous contrast bolus in the pulmonary arteries. 2. Moderate pericardial effusion, but reduced from the prominent pericardial effusion that was present on 11/22/2015. 3. Mild cardiomegaly. 4. Small bilateral pleural effusions with dependent atelectasis in both lower lobes. 5. 1.3 cm nonspecific hypodense lesion in the upper pole the left kidney. Back in 2011 this measured about 1.0 cm, accordingly this is most likely benign, but technically nonspecific. Electronically Signed   By: Walter  Liebkemann M.D.   On: 12/05/2015 14:03   Dg Chest Portable 1 View  Result Date: 12/05/2015 CLINICAL DATA:  Right chest pain, shortness of breath x3 days EXAM: PORTABLE CHEST 1 VIEW COMPARISON:  CT chest dated 11/22/2015 FINDINGS: Low lung volumes. Increased interstitial markings, favoring vascular   crowding. Mild patchy bilateral lower lobe opacities, likely atelectasis. Mild cardiomegaly. IMPRESSION: Low lung volumes. Mild patchy bilateral lower lobe opacities, likely atelectasis. Electronically Signed   By: Sriyesh  Krishnan M.D.   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 EXAM: CARDIAC MRI TECHNIQUE: The patient was scanned on a 1.5 Tesla GE magnet. A dedicated cardiac coil was used. Functional imaging was done using Fiesta sequences. 2,3, and 4 chamber views were done to assess for RWMA's. Modified Simpson's rule using a short axis stack was used to calculate an ejection fraction on a dedicated work station using Circle  software. The patient received 28 cc of Multihance. After 10 minutes inversion recovery sequences were used to assess for infiltration and scar tissue. CONTRAST:  28 cc  of Multihance FINDINGS: 1. Normal left ventricular size, thickness and systolic function (LVEF = 57%) with no regional wall motion abnormalities. LVEDD; 54 mm LVESD:  38 mm LVEDV:  157 ml LVESV:  76 ml SV:  89 ml CO:  7.0 L/min 2. Normal right ventricular size, thickness and systolic function (RVEF = 55%) with no regional wall motion abnormalities. 3.  Mild left atrial dilatation. 4.  Mild mitral and trivial tricuspid regurgitation. 5. Normal size of the aortic root, ascending aorta and pulmonary artery. 6. There is mild amount of pericardial effusion predominantly in the proximity to the right ventricle. 7. There is thickening of the pericardium focally up to 8 mm, and significant circumferential late gadolinium enhancement of the pericardium and epicardium. There is IVC dilatation with no respiratory variation. There is also double bounce of the interventricular septum in diastole. IMPRESSION: 1. Normal left ventricular size, thickness and systolic function (LVEF = 57%) with no regional wall motion abnormalities. 2. Normal right ventricular size, thickness and systolic function (RVEF = 55%) with no regional wall motion abnormalities. 3.  Mild left atrial dilatation. 4.  Mild mitral and trivial tricuspid regurgitation. 5. There is mild amount of pericardial effusion predominantly in the proximity to the right ventricle. 6. There is thickening of the pericardium focally up to 8 mm, and significant circumferential late gadolinium enhancement of the pericardium and epicardium. An IVC dilatation with no respiratory variation is seen. There is also double bounce of the interventricular septum in diastole. 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 A right sided cardiac cath is recommended. Katarina  Nelson Electronically Signed   By: Katarina  Nelson   On: 12/06/2015 15:04    Assessment & Plan    CHEST PAIN:  Could be pericarditis (see below).  However, GI saw today and she is being treated for possible candidiasis with IV Diflucan.  PERICARDITIS.  Still on higher dose steroids.   Trivial pericardial effusion.  I will repeat an ESR and CRP.  She reports continued significant pain.  However, she has not been asking for this.  Needs to ambulate.  I don't anticipate any further in patient therapy.  Home possibly in the AM if she is not getting IV Diflucan.   Signed, James Hochrein, MD  12/20/2015, 11:19 AM   

## 2015-12-20 NOTE — Progress Notes (Signed)
Night RN report that pt order [place] food last night and ate without difficulty. Emelda Brothershristy Richad Ramsay RN

## 2015-12-20 NOTE — Consult Note (Signed)
Referring Provider: Dr. Antoine PocheHochrein Primary Care Physician:  Darrow BussingKOIRALA,DIBAS, MD Primary Gastroenterologist:  Gentry FitzUnassigned  Reason for Consultation:  Dysphagia  HPI: Alicia Wood is a 45 y.o. female admitted for pericariditis being seen for new onset of dysphagia. Reports no trouble swallowing food or liquid prior to admit but since being in the hospital she is having pain with swallowing pills and food and feels like pills hang up with taking them. Having left-sided chest pain with swallowing. Feels like pills "sit there" and won't go down. Belching on occasion but denies heartburn. Unable to tell any differences in the types of chest pain she has been having lately. Has been on steroids for treatment of her pericarditis. Reports weight fluctuates up and down.  Past Medical History:  Diagnosis Date  . CHF (congestive heart failure) (HCC)   . Hyperlipidemia   . Hypertension   . Pericardial effusion     Past Surgical History:  Procedure Laterality Date  . ABDOMINAL HYSTERECTOMY    . APPENDECTOMY    . PARTIAL HYSTERECTOMY    . TUBAL LIGATION      Prior to Admission medications   Medication Sig Start Date End Date Taking? Authorizing Provider  aspirin 81 MG chewable tablet Chew 1 tablet (81 mg total) by mouth daily. 11/29/15  Yes Arty BaumgartnerLindsay B Roberts, NP  colchicine 0.6 MG tablet Take 1 tablet (0.6 mg total) by mouth 2 (two) times daily. 12/10/15  Yes Belkys A Regalado, MD  HYDROcodone-acetaminophen (NORCO) 5-325 MG tablet Take 1 tablet by mouth every 6 (six) hours as needed for moderate pain. 12/10/15  Yes Belkys A Regalado, MD  ibuprofen (ADVIL,MOTRIN) 600 MG tablet Take 1 tablet (600 mg total) by mouth 3 (three) times daily. 12/10/15  Yes Belkys A Regalado, MD  pantoprazole (PROTONIX) 40 MG tablet Take 1 tablet (40 mg total) by mouth daily. 12/11/15  Yes Belkys A Regalado, MD  polyethylene glycol (MIRALAX / GLYCOLAX) packet Take 17 g by mouth 2 (two) times daily. 12/10/15  Yes Belkys A Regalado, MD   predniSONE (DELTASONE) 20 MG tablet Take 4  tablet for 2 days then 3 tablets for 7 days the 2 tablets for 4 days then 1 tablet for 3 days. 12/10/15  Yes Belkys A Regalado, MD  senna-docusate (SENOKOT-S) 8.6-50 MG tablet Take 1 tablet by mouth 2 (two) times daily. 12/10/15  Yes Belkys A Regalado, MD    Scheduled Meds: . acetaminophen  650 mg Oral Q6H  . aspirin  81 mg Oral Daily  . colchicine  0.6 mg Oral BID  . ibuprofen  600 mg Oral TID  . pantoprazole  40 mg Oral Daily  . predniSONE  80 mg Oral Q breakfast   Continuous Infusions:  PRN Meds:.HYDROmorphone (DILAUDID) injection  Allergies as of 12/17/2015  . (No Known Allergies)    Family History  Problem Relation Age of Onset  . Hypertension Father     Social History   Social History  . Marital status: Legally Separated    Spouse name: N/A  . Number of children: N/A  . Years of education: N/A   Occupational History  . Not on file.   Social History Main Topics  . Smoking status: Never Smoker  . Smokeless tobacco: Never Used  . Alcohol use Yes     Comment: occasional  . Drug use: No  . Sexual activity: Not on file   Other Topics Concern  . Not on file   Social History Narrative  . No narrative on file  Review of Systems: All negative except as stated above in HPI.  Physical Exam: Vital signs: Vitals:   12/19/15 2032 12/20/15 0459  BP: 124/77 119/64  Pulse: 69 83  Resp: 20   Temp: 98.3 F (36.8 C) 98.3 F (36.8 C)   Last BM Date: 12/16/15 General:   Lethargic, obese, pleasant and cooperative in NAD HEENT: anicteric sclera, oropharynx clear, no oral thrush Neck: supple, nontender Lungs:  Clear throughout to auscultation.   No wheezes, crackles, or rhonchi. No acute distress. Heart:  Regular rate and rhythm; no murmurs, clicks, rubs,  or gallops. Abdomen: epigastric tenderness with guarding, soft, nondistended, +BS  Rectal:  Deferred Ext: no edema  GI:  Lab Results:  Recent Labs   12/17/15 1052 12/18/15 0355  WBC 13.9* 12.3*  HGB 10.8* 10.6*  HCT 34.2* 33.9*  PLT 352 370   BMET  Recent Labs  12/17/15 1052 12/18/15 0355  NA 136 137  K 4.1 4.0  CL 98* 99*  CO2 31 29  GLUCOSE 130* 175*  BUN 14 17  CREATININE 0.75 0.89  CALCIUM 9.0 9.0   LFT No results for input(s): PROT, ALBUMIN, AST, ALT, ALKPHOS, BILITOT, BILIDIR, IBILI in the last 72 hours. PT/INR No results for input(s): LABPROT, INR in the last 72 hours.   Studies/Results: No results found.  Impression/Plan: Odynophagia with swallowing pills since admit and denies any pain or trouble swallowing food/liquid/pills prior to admit. Has been on steroids for treatment of her pericarditis and may have developed Esophageal Candidiasis. No oral Candidiasis seen. Doubt an esophageal stricture or ring as the source of her current symptoms. Would treat empirically with Diflucan for a yeast infection of the esophagus and if no better, then may need an EGD but for now would hold off on an endoscopic procedure. Symptoms not consistent with an obstructive process and I do not think a barium swallow is needed either. Will start IV Diflucan and see if that is beneficial. Increase PPI to BID.    LOS: 3 days   Markevion Lattin C.  12/20/2015, 9:59 AM  Pager [phone]  If no answer or after 5 PM call [phone]

## 2015-12-21 ENCOUNTER — Telehealth: Payer: Self-pay | Admitting: Physician Assistant

## 2015-12-21 MED ORDER — FLUCONAZOLE 200 MG PO TABS
200.0000 mg | ORAL_TABLET | Freq: Every day | ORAL | Status: DC
  Administered 2015-12-21: 200 mg via ORAL
  Filled 2015-12-21 (×2): qty 1

## 2015-12-21 MED ORDER — FLUCONAZOLE 200 MG PO TABS: 200.0000 mg | ORAL_TABLET | Freq: Every day | ORAL | Status: DC

## 2015-12-21 MED ORDER — PREDNISONE 10 MG PO TABS: ORAL_TABLET | ORAL | 0 refills | Status: DC

## 2015-12-21 MED ORDER — ASPIRIN EC 81 MG PO TBEC: 81.0000 mg | DELAYED_RELEASE_TABLET | Freq: Every day | ORAL | Status: DC

## 2015-12-21 MED ORDER — COLCHICINE 0.6 MG PO TABS: 0.6000 mg | ORAL_TABLET | Freq: Every day | ORAL | Status: DC

## 2015-12-21 MED ORDER — FLUCONAZOLE 200 MG PO TABS
200.0000 mg | ORAL_TABLET | Freq: Every day | ORAL | 0 refills | Status: DC
Start: 2015-12-22 — End: 2016-01-14

## 2015-12-21 MED ORDER — COLCHICINE 0.6 MG PO TABS: 0.6000 mg | ORAL_TABLET | Freq: Two times a day (BID) | ORAL | 1 refills | Status: DC

## 2015-12-21 NOTE — Discharge Summary (Signed)
Discharge Summary    Patient ID: Alicia Wood,  MRN: [id number], DOB/AGE: [birth year] 47 y.o.  Admit date: 12/17/2015 Discharge date: 12/21/2015  Primary Care Provider: Darrow Bussing Primary Cardiologist: Dr. [name]   Discharge Diagnoses    Active Problems:   Pericarditis   Allergies No Known Allergies  Diagnostic Studies/Procedures   Transthoracic Echocardiography 12/19/15 Study Conclusions  - Left ventricle: The cavity size was normal. Systolic function was   normal. The estimated ejection fraction was in the range of 55%   to 60%. Wall motion was normal; there were no regional wall   motion abnormalities. The study is not technically sufficient to   allow evaluation of LV diastolic function. - Aortic valve: Trileaflet; normal thickness leaflets. There was no   regurgitation. - Aortic root: The aortic root was normal in size. - Mitral valve: There was no regurgitation. - Left atrium: The atrium was normal in size. - Right ventricle: Systolic function was normal. - Right atrium: The atrium was normal in size. - Tricuspid valve: There was no regurgitation. - Pulmonary arteries: Systolic pressure was within the normal   range. - Inferior vena cava: The vessel was normal in size. - Pericardium, extracardiac: A trivial pericardial effusion was   identified. Features were not consistent with tamponade   physiology.  _____________   History of Present Illness   45 y/o female with recent h/o pericarditis and moderate to large pericardial effusion w/o tamponade physiology, first diagnosed on first admission 8/31-9/7. She initially presented with complaint of progressive SOB and chest discomfort and was found to have moderate to large pericardial effusion on CT scan. Her TSH was normal. CBC showed some anemia but no active bleeding. Guaiac stool was negative. She was placed on colchicine and a set up for possible tap the effusion if she did not improve with medical  therapy. Follow-up echocardiogram showed moderate to large circumferential pericardial effusion but no tamponade not, her colchicine was increased and ibuprofen was added. She was placed on steroid and PPI for brief period of time. She had elevated CRP and sedimentation rate consistent with pericarditis. Steroid was ultimately stopped and the naproxen was added to her medical regimen. She had a repeat echocardiogram on 9/5 that showed decreasing size of effusion and no temponade.   She presented back for repeat admission 12/05/2015 after developing recurrent CP and dyspnea. Repeat echo showed EF 55-60%, grade 2 diastolic dysfunction, moderately reduced RV EF, PA peak pressure 37 mmHg, small circumferential pericardial effusion was noted without hemodynamic compromise. She underwent cardiac MRI which showed normal EF 57%, RVEF 55%, mild amount of pericardial effusion predominantly in the proximity of the right ventricle, and the pericardial focally up to 8 mm, and a significant circumferential late gadolinium enhancement of the pericardium and epicardium. 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 She was treated with IV Solu-Medrol in the hospital, and she was eventually discharged on oral prednisone with plan for repeat CMRI or right heart cath in 4-6 weeks. She has had extensive workup including negative HIV, Quantiferon TB, ASA orRF. She was discharged on steroid taper.  She was seen in clinic on 12/14/15 for post hospital f/u. She continued to complain of chest tightness, however improved since starting steroids. No significant dyspnea. She was instructed to continue steroid taper and f/u in 3-4 weeks to reassess symptoms to determine if she would need a RHC vs MRI.   She presents back to the ED with complaint of recurrent pain, progressively  worsening. This recurrence is more severe than last presentation. She notes severe chest tightness and dyspnea, worse in supine  position. Improved some sitting up right. She notes mild occasional lightheaded but denies syncope/ near syncope. It is pleuritic, worse with deep breaths, cough and sudden movements. She reports full medication compliance. BP is stable in the ED. White count is elevated at 13.9. Hgb is 10.8. Both improved from last assessment.  EKG shows NSR. HR in the 80s.    Hospital Course  Consultants: GI Deboraha Sprang GI Reason for Consultation:  Dysphagia  HPI: Alicia Wood is a 45 y.o. female admitted for pericariditis being seen for new onset of dysphagia. Reports no trouble swallowing food or liquid prior to admit but since being in the hospital she is having pain with swallowing pills and food and feels like pills hang up with taking them. Having left-sided chest pain with swallowing. Feels like pills "sit there" and won't go down. Belching on occasion but denies heartburn. Unable to tell any differences in the types of chest pain she has been having lately. Has been on steroids for treatment of her pericarditis. Reports weight fluctuates up and down. Impression/Plan: Odynophagia with swallowing pills since admit and denies any pain or trouble swallowing food/liquid/pills prior to admit. Has been on steroids for treatment of her pericarditis and may have developed Esophageal Candidiasis. No oral Candidiasis seen. Doubt an esophageal stricture or ring as the source of her current symptoms. Would treat empirically with Diflucan for a yeast infection of the esophagus and if no better, then may need an EGD but for now would hold off on an endoscopic procedure. Symptoms not consistent with an obstructive process and I do not think a barium swallow is needed either. Will start IV Diflucan and see if that is beneficial. Increase PPI to BID.  Ms. Mayford Knife' chest pain was felt to be related to pericarditis, with recent admission for the same. She will be started on a steroid taper and her colchicine will be increased to  0.6mg  BID.   She had echo this admission, full report above. Still with trivial pericardial effusion. She was requesting referral to pain management. This was set up for her by [facility] Care management. She will also follow up with GI in the next 3-4 weeks.   _____________  Discharge Vitals Blood pressure 104/66, pulse 66, temperature 98.4 F (36.9 C), temperature source Oral, resp. rate 16, height 5\' 8"  (1.727 m), weight 268 lb (121.6 kg), SpO2 99 %.  Filed Weights   12/19/15 0443 12/20/15 0459 12/21/15 0500  Weight: 271 lb 14.4 oz (123.3 kg) 272 lb 8 oz (123.6 kg) 268 lb (121.6 kg)    Labs & Radiologic Studies    Dg Chest 2 View  Result Date: 12/17/2015 CLINICAL DATA:  Patient with chest pressure and tightness. Recent history of pericarditis. EXAM: CHEST  2 VIEW COMPARISON:  Chest radiograph 12/05/2015. FINDINGS: Low lung volumes. Stable enlarged cardiac and mediastinal contours. Persistent consolidation within the left lower hemi thorax. Small left pleural effusion. No pneumothorax. Thoracic spine degenerative changes. IMPRESSION: Consolidation within the left lower hemi thorax with associated pleural fluid. Infection not excluded. Mild cardiomegaly. Electronically Signed   By: Annia Belt M.D.   On: 12/17/2015 12:01   Ct Chest W Contrast  Result Date: 11/22/2015 CLINICAL DATA:  45 year old female with chest pain and shortness of breath. EXAM: CT CHEST WITH CONTRAST TECHNIQUE: Multidetector CT imaging of the chest was performed during intravenous contrast administration. CONTRAST:  75mL ISOVUE-300 IOPAMIDOL (ISOVUE-300) INJECTION 61% COMPARISON:  None. FINDINGS: Cardiovascular: A moderate to large pericardial effusion is noted. Heart is otherwise unremarkable. There is no evidence of thoracic aortic aneurysm. Mediastinum/Nodes: No enlarged lymph nodes or mediastinal mass. Lungs/Pleura: Mild bibasilar atelectasis noted. There is no evidence of airspace disease, consolidation, nodule, mass, pleural  effusion or pneumothorax. Upper Abdomen: Unremarkable Musculoskeletal: No acute or suspicious abnormalities. IMPRESSION: Moderate to large pericardial effusion. These results will be called to the ordering clinician or representative by the Radiologist Assistant, and communication documented in the PACS or zVision Dashboard. Electronically Signed   By: Harmon PierJeffrey  Hu M.D.   On: 11/22/2015 17:45   Ct Angio Chest Pe W/cm &/or Wo Cm  Result Date: 12/05/2015 CLINICAL DATA:  Chest pain and shortness of Breath starting this morning. Recent hospitalization for pericardial effusion. EXAM: CT ANGIOGRAPHY CHEST WITH CONTRAST TECHNIQUE: Multidetector CT imaging of the chest was performed using the standard protocol during bolus administration of intravenous contrast. Multiplanar CT image reconstructions and MIPs were obtained to evaluate the vascular anatomy. CONTRAST:  80 cc Isovue 370 COMPARISON:  12/05/2015 and 11/22/2015 FINDINGS: Body habitus and motion artifact reduces exam sensitivity and specificity. Cardiovascular: Mildly dilute contrast bolus with mild heterogeneity of the pulmonary arterial contrast. Reduced sensitivity for subsegmental emboli. No filling defect is identified in the pulmonary arterial tree to suggest pulmonary embolus. No acute aortic findings. Mild cardiomegaly and moderate pericardial effusion, reduced from the large pericardial effusion that was present on 11/22/2015. Mediastinum/Nodes: Is no pathologic adenopathy. Lungs/Pleura: Small bilateral pleural effusions. Mild dependent atelectasis in both lower lobes. Upper Abdomen: 1.3 cm hypodense lesion in the upper pole the left kidney, previously approximately 1.0 cm on 05/29/2009, nonspecific. Musculoskeletal: Thoracic spondylosis. Review of the MIP images confirms the above findings. IMPRESSION: 1. No embolus identified. Reduced sensitivity is specially for smaller emboli due to body habitus, motion artifact, and somewhat heterogeneous contrast  bolus in the pulmonary arteries. 2. Moderate pericardial effusion, but reduced from the prominent pericardial effusion that was present on 11/22/2015. 3. Mild cardiomegaly. 4. Small bilateral pleural effusions with dependent atelectasis in both lower lobes. 5. 1.3 cm nonspecific hypodense lesion in the upper pole the left kidney. Back in 2011 this measured about 1.0 cm, accordingly this is most likely benign, but technically nonspecific. Electronically Signed   By: Gaylyn RongWalter  Liebkemann M.D.   On: 12/05/2015 14:03   Dg Chest Portable 1 View  Result Date: 12/05/2015 CLINICAL DATA:  Right chest pain, shortness of breath x3 days EXAM: PORTABLE CHEST 1 VIEW COMPARISON:  CT chest dated 11/22/2015 FINDINGS: Low lung volumes. Increased interstitial markings, favoring vascular crowding. Mild patchy bilateral lower lobe opacities, likely atelectasis. Mild cardiomegaly. IMPRESSION: Low lung volumes. Mild patchy bilateral lower lobe opacities, likely atelectasis. Electronically Signed   By: Charline BillsSriyesh  Krishnan M.D.   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 EXAM: CARDIAC MRI TECHNIQUE: The patient was scanned on a 1.5 Tesla GE magnet. A dedicated cardiac coil was used. Functional imaging was done using Fiesta sequences. 2,3, and 4 chamber views were done to assess for RWMA's. Modified Simpson's rule using a short axis stack was used to calculate an ejection fraction on a dedicated work Research officer, trade unionstation using Circle software. The patient received 28 cc of Multihance. After 10 minutes inversion recovery sequences were used to assess for infiltration and scar tissue. CONTRAST:  28 cc  of Multihance FINDINGS: 1. Normal left ventricular size, thickness and systolic function (LVEF = 57%) with no regional wall motion abnormalities. LVEDD; 54 mm LVESD:  38 mm LVEDV:  157 ml LVESV:  76 ml SV:  89 ml CO:   7.0 L/min 2. Normal right ventricular size, thickness and systolic function (RVEF = 55%) with no regional wall motion abnormalities. 3.  Mild left atrial dilatation. 4.  Mild mitral and trivial tricuspid regurgitation. 5. Normal size of the aortic root, ascending aorta and pulmonary artery. 6. There is mild amount of pericardial effusion predominantly in the proximity to the right ventricle. 7. There is thickening of the pericardium focally up to 8 mm, and significant circumferential late gadolinium enhancement of the pericardium and epicardium. There is IVC dilatation with no respiratory variation. There is also double bounce of the interventricular septum in diastole. IMPRESSION: 1. Normal left ventricular size, thickness and systolic function (LVEF = 57%) with no regional wall motion abnormalities. 2. Normal right ventricular size, thickness and systolic function (RVEF = 55%) with no regional wall motion abnormalities. 3.  Mild left atrial dilatation. 4.  Mild mitral and trivial tricuspid regurgitation. 5. There is mild amount of pericardial effusion predominantly in the proximity to the right ventricle. 6. There is thickening of the pericardium focally up to 8 mm, and significant circumferential late gadolinium enhancement of the pericardium and epicardium. An IVC dilatation with no respiratory variation is seen. There is also double bounce of the interventricular septum in diastole. 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 A right sided cardiac cath is recommended. Tobias Alexander Electronically Signed   By: Tobias Alexander   On: 12/06/2015 15:04    Disposition   Pt is being discharged home today in good condition.  Follow-up Plans & Appointments    Follow-up Information    [name] .   Specialty:  Pain Medicine Why:  Office accepts [employer]. Office to call patient with an appointment time. Per office it may take up to a  week or a month in regards to appointments.  Contact information: [address] [place] [address] [phone]        [facility] Course, [place] Follow up on 01/14/2016.   Specialties:  Cardiology, Radiology Why:  at 10:30am for hospital follow up.  Contact information: [address] [place] [address] [phone]        [facility] .   Why:  Please call the GI office to schedule an appt. You will need to be seen in the next 3-4 weeks.  Contact information: [address] [place] [address] [phone]          Discharge Instructions    AMB Referral to [facility] Care Management    Complete by:  As directed    Please assign UMR member for post toc. Will additional education and support on diet and nutrition pertaining to heart healthy low sodium foods per patient request. Please call with questions. Thanks.[name], MSN-Ed, [facility] [phone]   Reason for consult:  Please assign UMR member for post toc   Expected date of contact:  1-3 days (reserved for hospital discharges)   Diet - low sodium heart healthy    Complete by:  As directed    Increase activity slowly    Complete by:  As directed       Discharge Medications   Current Discharge Medication List  START taking these medications   Details  aspirin EC 81 MG tablet Take 1 tablet (81 mg total) by mouth daily.    fluconazole (DIFLUCAN) 200 MG tablet Take 1 tablet (200 mg total) by mouth daily. Qty: 14 tablet, Refills: 0      CONTINUE these medications which have CHANGED   Details  colchicine 0.6 MG tablet Take 1 tablet (0.6 mg total) by mouth 2 (two) times daily. Qty: 60 tablet, Refills: 1    predniSONE (DELTASONE) 10 MG tablet Take 80mg  daily x 7 days, 70mg  daily x 7 days, 60mg  x 7 days, 50mg  x 7 days, 40mg  x 7 days, 30mg  x 7 days, 20mg  x 7 days, 10mg  x 7 days. Qty: 252 tablet, Refills: 0      CONTINUE these medications  which have NOT CHANGED   Details  ibuprofen (ADVIL,MOTRIN) 600 MG tablet Take 1 tablet (600 mg total) by mouth 3 (three) times daily. Qty: 30 tablet, Refills: 0    pantoprazole (PROTONIX) 40 MG tablet Take 1 tablet (40 mg total) by mouth daily. Qty: 30 tablet, Refills: 0    polyethylene glycol (MIRALAX / GLYCOLAX) packet Take 17 g by mouth 2 (two) times daily. Qty: 14 each, Refills: 0    senna-docusate (SENOKOT-S) 8.6-50 MG tablet Take 1 tablet by mouth 2 (two) times daily. Qty: 30 tablet, Refills: 0      STOP taking these medications     aspirin 81 MG chewable tablet      HYDROcodone-acetaminophen (NORCO) 5-325 MG tablet            Outstanding Labs/Studies     Duration of Discharge Encounter   Greater than 30 minutes including physician time.  Signed, Little Ishikawa NP 12/21/2015, 5:03 PM

## 2015-12-21 NOTE — Consult Note (Signed)
   [facility]   12/21/2015  Octavio Mannsamela Wood 05/02/1970 [id number]    [facility]. She states she has received 19 out of 30 pages of faxed information for referral. Refaxed page 20 to 30 at fax number [phone]. Will follow up.  Raiford NobleAtika Haizlee Henton, MSN-Ed, RN,BSN [facility] Liaison [phone]

## 2015-12-21 NOTE — Consult Note (Signed)
   [facility]   12/21/2015  Octavio Mannsamela Williams 05/09/1970 [id number]   Many efforts made on patient's behalf by writer and inpatient RNCM to get Ms. Williams set up with [name]. After numerous fax attempts, Noreene Lar[facility]Jill at [facility] received initial referral information. Referral form received via fax and picked up from office for Cardiology to fill out of additional referral requirements.  Spoke with Dr. Antoine PocheHochrein to update. Also, with Dr. Jenene SlickerHochrein's permission, writer provided Dr. Jenene SlickerHochrein's cell number to Noreene LarssonJill at [facility] for Pain Management MD to contact Dr. Antoine PocheHochrein for a peer to peer in order to get Ms. Williams in sooner for pain clinic appointment.   Inpatient RNCM has referral form for PA to complete so it can be [facility].  Of note, [name] has been made of all of the above. Telephone number and address of pain center provided as well. [name] will receive follow up call post discharge. She expresses appreciation of efforts made by inpatient RNCM and writer on her behalf.    Raiford NobleAtika Hall, MSN-Ed, RN,BSN [facility] Liaison [phone]

## 2015-12-21 NOTE — Progress Notes (Signed)
Patient Name: Alicia Wood Date of Encounter: 12/21/2015  Hospital Problem List     Active Problems:   Pericarditis    Patient Profile     45 y/o female with recent diagnosis of pericarditis and moderate to large pericardial effusion w/o tamponade physiology, multiple admissions x 2 in the past month, improvement on last echo to mild effusion, presenting back with worsening recurrent CP and dyspnea.   Subjective   Feels much better this morning.  Able to ambulate last night.  Pain controlled.    Inpatient Medications    . acetaminophen  650 mg Oral Q6H  . aspirin  81 mg Oral Daily  . colchicine  0.6 mg Oral BID  . fluconazole (DIFLUCAN) IV  200 mg Intravenous Q24H  . ibuprofen  600 mg Oral TID  . pantoprazole  40 mg Oral BID  . predniSONE  80 mg Oral Q breakfast    Vital Signs    Vitals:   12/20/15 0459 12/20/15 1336 12/20/15 2100 12/21/15 0500  BP: 119/64 98/66 114/74 111/73  Pulse: 83 79 61 (!) 58  Resp:  16 18 15   Temp: 98.3 F (36.8 C) 98.4 F (36.9 C) 98.2 F (36.8 C) 98 F (36.7 C)  TempSrc: Oral Oral    SpO2: 99% 99% 99% 100%  Weight: 272 lb 8 oz (123.6 kg)   268 lb (121.6 kg)  Height:        Intake/Output Summary (Last 24 hours) at 12/21/15 0839 Last data filed at 12/20/15 2100  Gross per 24 hour  Intake              700 ml  Output                0 ml  Net              700 ml   Filed Weights   12/19/15 0443 12/20/15 0459 12/21/15 0500  Weight: 271 lb 14.4 oz (123.3 kg) 272 lb 8 oz (123.6 kg) 268 lb (121.6 kg)    Physical Exam    GEN: Well nourished, well developed, in  no acute distress.  Neck: Supple, no JVD, carotid bruits, or masses. Cardiac: RRR, no rubs, or gallops. No clubbing, cyanosis, no edema.  Radials/DP/PT 2+ and equal bilaterally.  Respiratory:  Respirations  regular and unlabored, clear to auscultation bilaterally. GI: Soft, nontender, nondistended, BS + x 4. Neuro:  Strength and sensation are intact.   Labs    CBC No  results for input(s): WBC, NEUTROABS, HGB, HCT, MCV, PLT in the last 72 hours. Basic Metabolic Panel No results for input(s): NA, K, CL, CO2, GLUCOSE, BUN, CREATININE, CALCIUM, MG, PHOS in the last 72 hours. Liver Function Tests No results for input(s): AST, ALT, ALKPHOS, BILITOT, PROT, ALBUMIN in the last 72 hours. No results for input(s): LIPASE, AMYLASE in the last 72 hours. Cardiac Enzymes No results for input(s): CKTOTAL, CKMB, CKMBINDEX, TROPONINI in the last 72 hours. BNP Invalid input(s): POCBNP D-Dimer No results for input(s): DDIMER in the last 72 hours. Hemoglobin A1C No results for input(s): HGBA1C in the last 72 hours. Fasting Lipid Panel No results for input(s): CHOL, HDL, LDLCALC, TRIG, CHOLHDL, LDLDIRECT in the last 72 hours. Thyroid Function Tests No results for input(s): TSH, T4TOTAL, T3FREE, THYROIDAB in the last 72 hours.  Invalid input(s): FREET3  Telemetry    NSR  ECG    NA  Radiology    Dg Chest 2 View  Result Date: 12/17/2015 CLINICAL  DATA:  Patient with chest pressure and tightness. Recent history of pericarditis. EXAM: CHEST  2 VIEW COMPARISON:  Chest radiograph 12/05/2015. FINDINGS: Low lung volumes. Stable enlarged cardiac and mediastinal contours. Persistent consolidation within the left lower hemi thorax. Small left pleural effusion. No pneumothorax. Thoracic spine degenerative changes. IMPRESSION: Consolidation within the left lower hemi thorax with associated pleural fluid. Infection not excluded. Mild cardiomegaly. Electronically Signed   By: Annia Beltrew  Davis M.D.   On: 12/17/2015 12:01   Ct Chest W Contrast  Result Date: 11/22/2015 CLINICAL DATA:  45 year old female with chest pain and shortness of breath. EXAM: CT CHEST WITH CONTRAST TECHNIQUE: Multidetector CT imaging of the chest was performed during intravenous contrast administration. CONTRAST:  75mL ISOVUE-300 IOPAMIDOL (ISOVUE-300) INJECTION 61% COMPARISON:  None. FINDINGS: Cardiovascular: A  moderate to large pericardial effusion is noted. Heart is otherwise unremarkable. There is no evidence of thoracic aortic aneurysm. Mediastinum/Nodes: No enlarged lymph nodes or mediastinal mass. Lungs/Pleura: Mild bibasilar atelectasis noted. There is no evidence of airspace disease, consolidation, nodule, mass, pleural effusion or pneumothorax. Upper Abdomen: Unremarkable Musculoskeletal: No acute or suspicious abnormalities. IMPRESSION: Moderate to large pericardial effusion. These results will be called to the ordering clinician or representative by the Radiologist Assistant, and communication documented in the PACS or zVision Dashboard. Electronically Signed   By: Harmon PierJeffrey  Hu M.D.   On: 11/22/2015 17:45   Ct Angio Chest Pe W/cm &/or Wo Cm  Result Date: 12/05/2015 CLINICAL DATA:  Chest pain and shortness of Breath starting this morning. Recent hospitalization for pericardial effusion. EXAM: CT ANGIOGRAPHY CHEST WITH CONTRAST TECHNIQUE: Multidetector CT imaging of the chest was performed using the standard protocol during bolus administration of intravenous contrast. Multiplanar CT image reconstructions and MIPs were obtained to evaluate the vascular anatomy. CONTRAST:  80 cc Isovue 370 COMPARISON:  12/05/2015 and 11/22/2015 FINDINGS: Body habitus and motion artifact reduces exam sensitivity and specificity. Cardiovascular: Mildly dilute contrast bolus with mild heterogeneity of the pulmonary arterial contrast. Reduced sensitivity for subsegmental emboli. No filling defect is identified in the pulmonary arterial tree to suggest pulmonary embolus. No acute aortic findings. Mild cardiomegaly and moderate pericardial effusion, reduced from the large pericardial effusion that was present on 11/22/2015. Mediastinum/Nodes: Is no pathologic adenopathy. Lungs/Pleura: Small bilateral pleural effusions. Mild dependent atelectasis in both lower lobes. Upper Abdomen: 1.3 cm hypodense lesion in the upper pole the left  kidney, previously approximately 1.0 cm on 05/29/2009, nonspecific. Musculoskeletal: Thoracic spondylosis. Review of the MIP images confirms the above findings. IMPRESSION: 1. No embolus identified. Reduced sensitivity is specially for smaller emboli due to body habitus, motion artifact, and somewhat heterogeneous contrast bolus in the pulmonary arteries. 2. Moderate pericardial effusion, but reduced from the prominent pericardial effusion that was present on 11/22/2015. 3. Mild cardiomegaly. 4. Small bilateral pleural effusions with dependent atelectasis in both lower lobes. 5. 1.3 cm nonspecific hypodense lesion in the upper pole the left kidney. Back in 2011 this measured about 1.0 cm, accordingly this is most likely benign, but technically nonspecific. Electronically Signed   By: Gaylyn RongWalter  Liebkemann M.D.   On: 12/05/2015 14:03   Dg Chest Portable 1 View  Result Date: 12/05/2015 CLINICAL DATA:  Right chest pain, shortness of breath x3 days EXAM: PORTABLE CHEST 1 VIEW COMPARISON:  CT chest dated 11/22/2015 FINDINGS: Low lung volumes. Increased interstitial markings, favoring vascular crowding. Mild patchy bilateral lower lobe opacities, likely atelectasis. Mild cardiomegaly. IMPRESSION: Low lung volumes. Mild patchy bilateral lower lobe opacities, likely atelectasis. Electronically Signed  By: Charline Bills M.D.   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 EXAM: CARDIAC MRI TECHNIQUE: The patient was scanned on a 1.5 Tesla GE magnet. A dedicated cardiac coil was used. Functional imaging was done using Fiesta sequences. 2,3, and 4 chamber views were done to assess for RWMA's. Modified Simpson's rule using a short axis stack was used to calculate an ejection fraction on a dedicated work [employer]. The patient received 28 cc of Multihance. After 10  minutes inversion recovery sequences were used to assess for infiltration and scar tissue. CONTRAST:  28 cc  of Multihance FINDINGS: 1. Normal left ventricular size, thickness and systolic function (LVEF = 57%) with no regional wall motion abnormalities. LVEDD; 54 mm LVESD:  38 mm LVEDV:  157 ml LVESV:  76 ml SV:  89 ml CO:  7.0 L/min 2. Normal right ventricular size, thickness and systolic function (RVEF = 55%) with no regional wall motion abnormalities. 3.  Mild left atrial dilatation. 4.  Mild mitral and trivial tricuspid regurgitation. 5. Normal size of the aortic root, ascending aorta and pulmonary artery. 6. There is mild amount of pericardial effusion predominantly in the proximity to the right ventricle. 7. There is thickening of the pericardium focally up to 8 mm, and significant circumferential late gadolinium enhancement of the pericardium and epicardium. There is IVC dilatation with no respiratory variation. There is also double bounce of the interventricular septum in diastole. IMPRESSION: 1. Normal left ventricular size, thickness and systolic function (LVEF = 57%) with no regional wall motion abnormalities. 2. Normal right ventricular size, thickness and systolic function (RVEF = 55%) with no regional wall motion abnormalities. 3.  Mild left atrial dilatation. 4.  Mild mitral and trivial tricuspid regurgitation. 5. There is mild amount of pericardial effusion predominantly in the proximity to the right ventricle. 6. There is thickening of the pericardium focally up to 8 mm, and significant circumferential late gadolinium enhancement of the pericardium and epicardium. An IVC dilatation with no respiratory variation is seen. There is also double bounce of the interventricular septum in diastole. 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 A right sided cardiac cath is recommended. Tobias Alexander Electronically Signed   By: Tobias Alexander   On:  12/06/2015 15:04    Assessment & Plan    CHEST PAIN:  Could be pericarditis (see below).  However, GI saw yesteray and she is being treated for possible candidiasis with IV Diflucan.  OK to discharge if she eats and ambulates this morning.  We will work out a slow steroid taper.  She is going to call a contact number this morning to try to facilitate her own pain clinic appt.  Unfortunately, I never heard back from [facility] about helping with this.  We need to understand whether she can be on PO Diflucan and for how long.  Of note sed rate increased but CRP was lower.    PERICARDITIS.  Still on higher dose steroids.   Trivial pericardial effusion.    HTN:  Her BP is well controlled.   Signed, Rollene Rotunda, MD  12/21/2015, 8:39 AM

## 2015-12-21 NOTE — Consult Note (Signed)
   [facility]   12/21/2015  Alicia Wood 12/17/1970 [id number]    Went to bedside. Spoke with [name]. She states she has been trying to [facility]. [facility] at [phone] and spoke with Zella BallRobin who indicates they are not in network with [facility]. States she has attempted to reach someone about this and left voicemail for patient as well.   Updated inpatient RNCM who is now attempting to find another pain clinic for patient to follow. Will continue to assist.  Raiford NobleAtika Hall, MSN-Ed, RN,BSN [facility] Hospital Liaison [phone]

## 2015-12-21 NOTE — Consult Note (Addendum)
   [facility]   12/21/2015  Alicia Wood 10/20/1970 [id number]     Inpatient RNCM and myself made several calls to different Pain Clinics in the area. [facility]phone call [facility] to [facility] at [phone]. Spoke with Alicia Wood. They are in network with [employer] insurance. [facility] fax[facility] referral information over to their office at [phone]. Also awaiting [facility] office to fax a referral form.  Alicia Wood states it can take a week up to a month for an appointment. Emphasized the importance of a sooner appointment to prevent readmission and asked if a peer to peer would make a difference. Alicia Wood states they will need referral information first and their office closes at 1 pm. Asked Alicia Wood to please fax referral form sooner than later so their MD can review.    Raiford NobleAtika Vinicius Brockman, MSN-Ed, RN,BSN [facility] Hospital Liaison [phone]

## 2015-12-21 NOTE — Progress Notes (Signed)
Patient states she was told she would be able to go home today. Per physician note she is ambulating and she is eating. Patient has asked multiple times if she will be able to go home. RN has paged the PA to inquire. Waiting for response at this time.

## 2015-12-21 NOTE — Telephone Encounter (Signed)
Patient's pharmacy [facility] called in after hours to report drug interaction uncovered in meds prescribed earlier (fluconazole + colchicine). Per med review, recommendation is to reduce dose of colchicine by 50% to reduce risk of colchicine toxicity as the combo can increase colchicine levels. Fluconazole was prescribed for 14 days. I asked pharmacist to prescribe colchicine 0.6mg  daily x 14 days then to increase dose to BID. I also reviewed this recommendation with the patient to stay on colchicine daily until finished with fluconazole then increase colchicine to twice a day. She verbalized understanding and gratitude.  Dayna Dunn PA-C

## 2015-12-21 NOTE — Progress Notes (Signed)
[facility]  Alicia Wood 45 y.o. 05/16/1970   Subjective: Doing better. Less pain with swallowing compared with yesterday. Tolerating clear liquid diet. Hungry.  Objective: Vital signs in last 24 hours: Vitals:   12/20/15 2100 12/21/15 0500  BP: 114/74 111/73  Pulse: 61 (!) 58  Resp: 18 15  Temp: 98.2 F (36.8 Wood) 98 F (36.7 Wood)    Physical Exam: Gen: alert, no acute distress, obese CV: RRR Chest: CTA B Abd: minimal epigastric tenderness with guarding, soft, nondistended, +BS  Lab Results: No results for input(s): NA, K, CL, CO2, GLUCOSE, BUN, CREATININE, CALCIUM, MG, PHOS in the last 72 hours. No results for input(s): AST, ALT, ALKPHOS, BILITOT, PROT, ALBUMIN in the last 72 hours. No results for input(s): WBC, NEUTROABS, HGB, HCT, MCV, PLT in the last 72 hours. No results for input(s): LABPROT, INR in the last 72 hours.    Assessment/Plan: Odynophagia concerning for Candida esophagitis improving with IV Diflucan. Ok to change to PO Diflucan 200 mg PO QD to complete a 14 day course. Change to soft diet for lunch and if tolerates can advance to heart healthy diet and go home from a GI standpoint. Will have office call her to arrange a GI f/u with me in 3-4 weeks. Will sign off. Call if questions.   Alicia Wood. 12/21/2015, 11:10 AM  Pager [phone]  If no answer or after 5 PM call [phone] ID: Alicia Wood, female   DOB: 01/21/1971, 45 y.o.   MRN: [id number]

## 2015-12-21 NOTE — Consult Note (Signed)
   [facility]   12/21/2015  Alicia Wood 07/13/1970 [id number]   [facility]. Spoke with Dr. Antoine PocheHochrein who requests that [facility] expedite pain clinic appointment as [name] is a high risk for readmission due to her pain issues. Discussed that inpatient RNCM has made referral to [facility] who is supposed to follow up with patient within 10 days of referral made for an appointment. Referral was made on 12/19/15. Will follow up with [name] post hospital discharge. Will make [facility] leadership aware of Dr. Jenene SlickerHochrein's concerns.   Raiford NobleAtika Haly Feher, MSN-Ed, RN,BSN [facility] Hospital Liaison [phone]

## 2015-12-21 NOTE — Progress Notes (Signed)
RN spoke with Dr Bosie ClosSchooler about changing IV Diflucan to PO. Verbal order to change medication. Pharmacist has been notified. Patient doing well, minimal discomfort, vital signs are stable.

## 2015-12-24 ENCOUNTER — Other Ambulatory Visit: Payer: Self-pay | Admitting: Cardiology

## 2015-12-24 ENCOUNTER — Other Ambulatory Visit: Payer: Self-pay | Admitting: *Deleted

## 2015-12-24 MED ORDER — IBUPROFEN 600 MG PO TABS: 600.0000 mg | ORAL_TABLET | Freq: Three times a day (TID) | ORAL | 0 refills | Status: DC

## 2015-12-24 NOTE — Patient Outreach (Signed)
Writer took [facility] cover [facility]. Handed it to [place]ill at the office. Of note, the same referral sheet was already faxed on Friday by inpatient RNCM. Nonetheless, took the form to office so there will be no confusion on receipt. Placed Dr. Jenene SlickerHochrein's information on referral paper for Pain MD to contact Dr. Antoine PocheHochrein for peer to peer.   Raiford NobleAtika Hall, MSN-Ed, RN,BSN [facility] Hospital Liaison [phone]

## 2015-12-24 NOTE — Patient Outreach (Addendum)
[facility]phone call made to [facility] at [phone]- spoke with Alycia Rossettiyan. States they have received all of the paperwork but there was one last cover sheet that had to be completed. This was not made known on Friday. Alycia RossettiRyan states he faxed the cover sheet to the inpatient case management office at [phone]. Called made to inpatient RNCM to make aware that additional sheet was faxed. Will follow up.  Raiford NobleAtika Hall, MSN-Ed, RN,BSN [facility] Care [facility] Hospital Liaison [phone]

## 2015-12-25 ENCOUNTER — Other Ambulatory Visit: Payer: Self-pay | Admitting: *Deleted

## 2015-12-25 NOTE — Patient Outreach (Addendum)
[facility]) Care Management  12/25/2015  Alicia Wood November 25, 1970 [id number]   Subjective: Telephone call to patient's home number, no answer, left HIPAA compliant voicemail message, and requested call back. Telephone call from patient and HIPAA verified.    Discussed [facility] Transition of care follow up, Link to Wellness, and care management services.   Patient in agreement to complete transition of care follow up.   Patient states she is hanging in there and continues to have a lot of trouble sleeping.   Has a hospital follow up appointment with primary MD on 12/27/15 and is planning to discuss the following issues pain management, trouble sleeping, feelings of depression, and will verify medication being taken are all appropriate for her treatment plan.  States the pharmacist at [facility] has questions regarding the diflucan and colchicine prescription on 12/21/15, discharging provider's physician assistant answered post discharge, prescription was revised, and patient is taking all medications as prescribed.  States she also received a call from primary MD's office on 12/25/15 regarding discontinuing ibuprofen due to potential to increase stomach issues.  States her last dose of ibuprofen was on 12/24/15.   States she has also researched online all of her medications and has no questions for pharmacist at this time.  Patient verbally completed PHQ 2 (scored = 4) & 9 (scored = 13)  Depression  screening tool.   States she has a history and family history  of depression.   States she currently feels depressed due to the health issues that she is experiencing and has always been able to work through it in the past.  States she does not like taking medications and is trying to stay out of the hospital. Has a supportive work family and does not feel depressed at work.   States she  has received grief counseling through spiritual care department at [facility], due to mother's  death, and has been on medications for depression in the past.  RNCM educated patient on the following available free resources: grief counseling services through local hospice and Employee Counseling Assistance Program (EACP).   States she has tried services to receive through local hospice and decided to go through [employer].   Patient gave RNCM permission to share depression screen tool results with primary MD.  Patient states she will discuss feelings of depression with MD during appointment on 12/27/15, may follow up with EACP if needed,  and if MD recommends.    Patient states she also has a follow up appointments with cardiologist on 01/14/16 and gastroenterologist on 01/15/16.   States she has not received a call from pain clinic regarding appointment to date.   RNCM advised will follow up with pain clinic for update.  RNCM educated patient on Psychologist, occupational [employer] Power of Attorney and Living Will) for [employer] employees and provided patient with phone number [phone])  for the Spiritual Care and [facility] department.  States she will call for an appointment for document completion.   Patient states she has family medical leave act and short term disability paperwork in place.   She will call [place] [employer] to verify if she has supplemental insurance (hospital indemnity) and file claim if appropriate.  States she will also contact [employer] regarding leave dates.  States she will call [facility] pharmacy to request [facility] of [facility] local [facility] to [employer], to utilize [employer] benefit.  Patient states she already  has [facility] contact information, Link to Wellness packet, and 24 hour Nurse line magnet.   Patient in agreement to receive successful outreach letter, EMMI educational materials via mail and email ([email or web address]). Patient will continue to receive [facility]  Transition of care follow up.   Objective: Per chart review: Patient hospitalized  12/17/15 - 12/21/15 for pericarditis and pericardial effusion. Patient hospitalized  12/05/15 - 12/10/15 for chronic diastolic congestive heart failure, pulmonary hypertension, and pericardial effusion.  Patient hospitalized  11/22/15 - 11/29/15 for chest pain and pericardial effusion.  ED visit on 04/24/15 for leg pain.  ED visit on 08/20/15  for chest pain.  ED visit on 11/11/15 for chest pain.  Patient also has a history of hyperlipidemia.    Assessment: Received [facility] Transition of care referral on 12/10/15.   Transition of care follow up ongoing and telephone screen completed.     Plan: RNCM will send patient successful outreach letter, EMMI educational handout "Coping With A Health Condition: Signs Of Depression ", and  EMMI video "Coping with a Health Condition" via email.   RNCM will call [name] regarding status of peer to peer review and appointment on 12/26/15. RNCM will call patient's primary MD to report depression screen tool results on 12/26/15.  RNCM will call patient for follow up on pain management clinic appointment, within 10 business days. RNCM will call patient to verify outcome of 12/27/15 primary MD visit regarding: pain management, trouble sleeping, feelings of depression, and will verify medication being taken are all appropriate for her treatment plan, within 3 business weeks. RNCM will call patient for follow up on Advanced Directives completion appointment scheduling, within 3 business weeks. RNCM will call patient to verify receipt of EMMI educational materials and answer any questions, within 3 business weeks.  Aireal Slater H. Gardiner Barefootooper RN, BSN, CCM [facility] Care Management [facility] Telephonic CM Phone: [phone] Fax: [phone]

## 2015-12-26 ENCOUNTER — Encounter: Payer: Self-pay | Admitting: *Deleted

## 2015-12-26 ENCOUNTER — Other Ambulatory Visit: Payer: Self-pay | Admitting: *Deleted

## 2015-12-26 NOTE — Patient Outreach (Signed)
[facility] spoke with Alycia Rossettiyan. He states the referral is in review with the Pain MD. Reiterated that Dr. Jenene SlickerHochrein's number was given to contact if there were any questions or concerns as Dr. Antoine PocheHochrein wanted Ms. Williams to be seen as soon as possible. Alycia RossettiRyan states he will also mention to the MD writer's comments. Alycia RossettiRyan states patient will be contacted for appointment and the like.  Raiford NobleAtika Mykalah Saari, MSN-Ed, RN,BSN [facility] Care Management Hospital Liaison [phone]

## 2015-12-26 NOTE — Patient Outreach (Addendum)
[facility](THN) Care Management  12/26/2015  Alicia Wood 03/06/1971 [id number]   Subjective: Telephone call to Alic[facility]Atika [facility] discussed case, conversation with patient on 12/25/15, received update on status of pain Wood referral,  and next steps for transition of care follow up.  Alicia Wood will update Dr. Antoine PocheHochrein via in basket message regarding status of patient's pain Wood appointment.  Telephone call to patient's primary MD office, spoke with Alicia Wood, and HIPAA verified.  Placed on hold and call disconnected.  Telephone call to Dr. Glendale ChardKoirala's office, left HIPAA compliant voicemail message on nurse line, and requested call back. Patient will continue to receive [facility] UMR Transition of care follow up.   Objective: Per chart review: Patient hospitalized  12/17/15 - 12/21/15 for pericarditis and pericardial effusion. Patient hospitalized  12/05/15 - 12/10/15 for chronic diastolic congestive heart failure, pulmonary hypertension, and pericardial effusion.  Patient hospitalized  11/22/15 - 11/29/15 for chest pain and pericardial effusion.  ED visit on 04/24/15 for leg pain.  ED visit on 08/20/15  for chest pain.  ED visit on 11/11/15 for chest pain.  Patient also has a history of hyperlipidemia.    Assessment: Received UMR Transition of care referral on 12/10/15.   Transition of care follow up ongoing and telephone screen completed.     Plan:  RNCM will send patient's primary MD condition update / barriers letter regarding depression screening tool results and transition of care follow up.   RNCM will call Alicia Wood regarding status of peer to peer review and appointment, within 7 business days. RNCM will call patient for follow up on pain management Wood appointment, within 10 business days. RNCM will call patient to verify outcome of 12/27/15 primary MD visit regarding: pain management, trouble sleeping, feelings of depression, and  will verify medication being taken are all appropriate for her treatment plan, within 3 business weeks. RNCM will call patient for follow up on Advanced Directives completion appointment scheduling, within 3 business weeks. RNCM will call patient to verify receipt of EMMI educational materials and answer any questions, within 3 business weeks.  Caelyn Route H. Gardiner Barefootooper RN, BSN, CCM [facility] Care Management [facility] Telephonic CM Phone: [phone] Fax: [phone]

## 2015-12-27 ENCOUNTER — Other Ambulatory Visit: Payer: Self-pay | Admitting: *Deleted

## 2015-12-27 DIAGNOSIS — B3781 Candidal esophagitis: Secondary | ICD-10-CM | POA: Diagnosis not present

## 2015-12-27 DIAGNOSIS — K219 Gastro-esophageal reflux disease without esophagitis: Secondary | ICD-10-CM | POA: Diagnosis not present

## 2015-12-27 DIAGNOSIS — Z79899 Other long term (current) drug therapy: Secondary | ICD-10-CM | POA: Diagnosis not present

## 2015-12-27 DIAGNOSIS — R7301 Impaired fasting glucose: Secondary | ICD-10-CM | POA: Diagnosis not present

## 2015-12-27 DIAGNOSIS — I319 Disease of pericardium, unspecified: Secondary | ICD-10-CM | POA: Diagnosis not present

## 2015-12-27 NOTE — Patient Outreach (Signed)
[facility](THN) Care Management  12/27/2015  Alicia Wood 04/29/1970 [id number]   Subjective: Case discussed in difficult case discussion rounds this morning.   RNCM advised transition of care call completed on 12/25/15,   will follow up with patient on 01/02/16 for update on primary MD visit results, and follow up with Dr. Antoine Wood regarding pain clinic appointment. Patient will [facility] to receive [facility] UMR Transition of care follow up.   Objective: Per chart review: Patient hospitalized 12/17/15 - 12/21/15 for pericarditis and pericardial effusion. Patient hospitalized 12/05/15 - 12/10/15 for chronic diastolic congestive heart failure, pulmonary hypertension, and pericardial effusion. Patient hospitalized 11/22/15 - 11/29/15 for chest pain and pericardial effusion. ED visit on 04/24/15 for leg pain. ED visit on 08/20/15 for chest pain. ED visit on 11/11/15 for chest pain. Patient also has a history of hyperlipidemia.    Assessment: Received UMR Transition of care referral on 12/10/15. Transition of care follow up ongoing and telephone screen completed.   Plan:[facility] willcall [name] regarding status of peer to peer review and appointment, within 7 business days. RNCM will follow up with Dr. Antoine Wood regarding pain clinic appointment within 10 business days.  RNCM will call patient for follow up on pain management clinic appointment, within 10 business days. RNCM will call patient to verify outcome of 12/27/15 primary MD visit regarding: pain management, trouble sleeping, feelings of depression, and will verify medication being taken are all appropriate for her treatment plan, within 3 business weeks. RNCM will call patient for follow up on Advanced Directives completion appointment scheduling, within 3 business weeks. RNCM will call patient to verify receipt of EMMI educational materials and answer any questions, within 3 business  weeks.  Casey Maxfield H. Gardiner Barefootooper RN, BSN, CCM [facility] Care Management [facility] Telephonic CM Phone: [phone]

## 2015-12-27 NOTE — Patient Outreach (Addendum)
[facility](THN) Care Management  12/27/2015  Octavio Mannsamela Williams 09/28/1970 [id number]   Subjective: Received voicemail message from Lafonda Mossesiana at Dr. Glendale ChardKoirala's office, states she is returning call, request call back, and to leave detailed message if unavailable when call returned. [facility] at Dr. Glendale ChardKoirala's office states advised of my conversation with patient on 12/25/15, depression screening tool results, and follow up needs.   States MD has not received 12/26/15 condition update letter and will verify with supervisor if it can be access from Epic. RNCM verified MD fax number [phone]) and condition update letter visible in Epic / CHL.   RNCM placed on hold and letter faxed manually by Antonieta IbaKelli Rose at [facility] to Dr. Docia ChuckKoirala.   Andrey CampanileSandy states they have received RNCM's condition update letter and reviewed RNCM's notes in Epic.  States letter and progress notes will be given to MD to review prior to patient's appointment this morning.  Patient has an appointment with MD later this morning.  Andrey CampanileSandy very appreciative of follow up call.  Patient will continue to receive [facility] Care [facility] UMR Transition of care follow up.  Objective: Per chart review: Patient hospitalized 12/17/15 - 12/21/15 for pericarditis and pericardial effusion. Patient hospitalized 12/05/15 - 12/10/15 for chronic diastolic congestive heart failure, pulmonary hypertension, and pericardial effusion. Patient hospitalized 11/22/15 - 11/29/15 for chest pain and pericardial effusion. ED visit on 04/24/15 for leg pain. ED visit on 08/20/15 for chest pain. ED visit on 11/11/15 for chest pain. Patient also has a history of hyperlipidemia.    Assessment: Received UMR Transition of care referral on 12/10/15. Transition of care follow up ongoing and telephone screen completed.   Plan:[facility] willcall [name] regarding status of peer to peer review and appointment, within 7 business  days. RNCM will call patient for follow up on pain management clinic appointment, within 10 business days. RNCM will call patient to verify outcome of 12/27/15 primary MD visit regarding: pain management, trouble sleeping, feelings of depression, and will verify medication being taken are all appropriate for her treatment plan, within 3 business weeks. RNCM will call patient for follow up on Advanced Directives completion appointment scheduling, within 3 business weeks. RNCM will call patient to verify receipt of EMMI educational materials and answer any questions, within 3 business weeks.  Zadok Holaway H. Gardiner Barefootooper RN, BSN, CCM [facility] Care Management [facility] Telephonic CM Phone: [phone]

## 2015-12-28 ENCOUNTER — Telehealth: Payer: Self-pay | Admitting: Cardiology

## 2015-12-28 ENCOUNTER — Other Ambulatory Visit: Payer: Self-pay | Admitting: *Deleted

## 2015-12-28 NOTE — Telephone Encounter (Signed)
New Message  Pt voiced stating she's on FMLA and needs paper faxed stating of her f/u appointment.  Fax Number is [phone], Attn: Alcario Droughtrica  Please f/u

## 2015-12-28 NOTE — Telephone Encounter (Signed)
I will forward to Dr Antoine PocheHochrein and RN to follow.

## 2015-12-28 NOTE — Patient Outreach (Signed)
[facility](THN) Care Management  12/28/2015  Alicia Wood 02/16/1971 [id number]  Subjective: Telephone call to [name], no answer, left HIPAA compliant voicemail message for Alicia Wood, and requested call back regarding status of referral.  [facility] no answer, left HIPAA compliant voicemail message for Alicia Wood, and requested call back regarding status of referral.   Objective: Per chart review: Patient hospitalized 12/17/15 - 12/21/15 for pericarditis and pericardial effusion. Patient hospitalized 12/05/15 - 12/10/15 for chronic diastolic congestive heart failure, pulmonary hypertension, and pericardial effusion. Patient hospitalized 11/22/15 - 11/29/15 for chest pain and pericardial effusion. ED visit on 04/24/15 for leg pain. ED visit on 08/20/15 for chest pain. ED visit on 11/11/15 for chest pain. Patient also has a history of hyperlipidemia.    Assessment: Received UMR Transition of care referral on 12/10/15. Transition of care follow up ongoing and telephone screen completed.   Plan:RNCM willcall [name] regarding status of peer to peer review and appointment, within 7 business days, if no return call. RNCM will follow up with Dr. Antoine PocheHochrein regarding pain clinic appointment within 10 business days.  RNCM will call patient for follow up on pain management clinic appointment, within 10 business days. RNCM will call patient to verify outcome of 12/27/15 primary MD visit regarding: pain management, trouble sleeping, feelings of depression, and will verify medication being taken are all appropriate for her treatment plan, within 3 business weeks. RNCM will call patient for follow up on Advanced Directives completion appointment scheduling, within 3 business weeks. RNCM will call patient to verify receipt of EMMI educational materials and answer any questions, within 3 business weeks.  Beth Goodlin H. Gardiner Barefootooper RN,  BSN, CCM [facility] Care [facility] Telephonic CM Phone: [phone]

## 2015-12-28 NOTE — Telephone Encounter (Signed)
Pt apt time and date was faxed to [place] @ [phone]

## 2015-12-31 DIAGNOSIS — H524 Presbyopia: Secondary | ICD-10-CM | POA: Diagnosis not present

## 2015-12-31 DIAGNOSIS — H5211 Myopia, right eye: Secondary | ICD-10-CM | POA: Diagnosis not present

## 2015-12-31 DIAGNOSIS — H52222 Regular astigmatism, left eye: Secondary | ICD-10-CM | POA: Diagnosis not present

## 2015-12-31 DIAGNOSIS — H5202 Hypermetropia, left eye: Secondary | ICD-10-CM | POA: Diagnosis not present

## 2016-01-01 ENCOUNTER — Other Ambulatory Visit: Payer: Self-pay | Admitting: *Deleted

## 2016-01-01 NOTE — Patient Outreach (Signed)
[facility](THN) Care Management  01/01/2016  Alicia Wood 10/02/1970 [id number]   Subjective: Telephone call from [facility] and HIPAA verified.   States he is returning RNCM's call and Dr. Thyra BreedMark Phillips (pain management MD) is requesting the etiology of the pericarditis.   RNCM advised medical records were sent with referral and referring MD (Dr. Antoine PocheHochrein) is requesting call regarding this referral and would be able to answer Dr. Vear ClockPhillips questions regarding etiology.    Alycia RossettiRyan states he will give Dr. Vear ClockPhillips the message and ask him to call Dr. Antoine PocheHochrein.    States the office is currently scheduling new patient visit for the end of November 2017.    RNCM advised referring MD has requested expedited referral via peer to peer.   Alycia RossettiRyan states he is not sure that Dr. Vear ClockPhillips will do peer to peer, verified Dr. Jenene SlickerHochrein's mobile phone number is on referral with message to call and will advised Dr. Vear ClockPhillips of the request, today by the end of business.   Alycia RossettiRyan states he will notify [facility] when patient's appointment is scheduled.  Patient will continue to receive [facility] Care [facility] UMR Transition of care follow up.  Objective: Per chart review: Patient hospitalized 12/17/15 - 12/21/15 for pericarditis and pericardial effusion. Patient hospitalized 12/05/15 - 12/10/15 for chronic diastolic congestive heart failure, pulmonary hypertension, and pericardial effusion. Patient hospitalized 11/22/15 - 11/29/15 for chest pain and pericardial effusion. ED visit on 04/24/15 for leg pain. ED visit on 08/20/15 for chest pain. ED visit on 11/11/15 for chest pain. Patient also has a history of hyperlipidemia.    Assessment: Received UMR Transition of care referral on 12/10/15. Transition of care follow up ongoing and telephone screen completed. Pain management appointment scheduling pending, MD review.    Plan:RNCM will follow up with Dr. Antoine PocheHochrein regarding pain clinic  appointment within 10 business days.  RNCM will call patient for follow up on pain management clinic appointment, within 10 business days. RNCM will call patient to verify outcome of 12/27/15 primary MD visit regarding: pain management, trouble sleeping, feelings of depression, and will verify medication being taken are all appropriate for her treatment plan, within 3 business weeks. RNCM will call patient for follow up on Advanced Directives completion appointment scheduling, within 3 business weeks. RNCM will call patient to verify receipt of EMMI educational materials and answer any questions, within 3 business weeks.  [name] H. Gardiner Barefootooper RN, BSN, CCM [facility] Care Management [facility] Telephonic CM Phone: [phone]

## 2016-01-02 ENCOUNTER — Ambulatory Visit: Payer: Self-pay | Admitting: *Deleted

## 2016-01-02 ENCOUNTER — Other Ambulatory Visit: Payer: Self-pay | Admitting: *Deleted

## 2016-01-02 NOTE — Patient Outreach (Addendum)
[facility](THN) Care Management  01/02/2016  Alicia Wood 04/07/1970 [id number]  Subjective: Telephone call to patient's home number, no answer, left HIPAA compliant voicemail message, and requested call back.    Objective: Per chart review: Patient hospitalized 12/17/15 - 12/21/15 for pericarditis and pericardial effusion. Patient hospitalized 12/05/15 - 12/10/15 for chronic diastolic congestive heart failure, pulmonary hypertension, and pericardial effusion. Patient hospitalized 11/22/15 - 11/29/15 for chest pain and pericardial effusion. ED visit on 04/24/15 for leg pain. ED visit on 08/20/15 for chest pain. ED visit on 11/11/15 for chest pain. Patient also has a history of hyperlipidemia.    Assessment: Received UMR Transition of care referral on 12/10/15. Transition of care follow up ongoing and telephone screen completed. Pain management appointment scheduling pending, MD review.   Additional transition of care follow up, pending patient contact.   Plan:RNCM will follow up with Dr. Antoine PocheHochrein regarding pain clinic appointment within 10 business days.  RNCM will call patient for follow up on pain management clinic appointment, transition of care follow up, within 10 business days, if no return call. RNCM will call patient to verify outcome of 12/27/15 primary MD visit regarding: pain management, trouble sleeping, feelings of depression, and will verify medication being taken are all appropriate for her treatment plan, within 3 business weeks. RNCM will call patient for follow up on Advanced Directives completion appointment scheduling, within 3 business weeks. RNCM will call patient to verify receipt of EMMI educational materials and answer any questions, within 3 business weeks.  [name] Barefootooper RN, BSN, CCM [facility] Care Management [facility] Telephonic CM Phone: [phone]

## 2016-01-03 ENCOUNTER — Ambulatory Visit: Payer: Self-pay | Admitting: *Deleted

## 2016-01-03 ENCOUNTER — Other Ambulatory Visit: Payer: Self-pay | Admitting: *Deleted

## 2016-01-03 NOTE — Patient Outreach (Signed)
[facility]N) Care Management  01/03/2016  Alicia Wood 07/05/1970 [id number]   Subjective: Telephone call to Dr. Antoine Wood, no answer, left HIPAA compliant voicemail message, and requested call back.  Objective: Per chart review: Patient hospitalized 12/17/15 - 12/21/15 for pericarditis and pericardial effusion. Patient hospitalized 12/05/15 - 12/10/15 for chronic diastolic congestive heart failure, pulmonary hypertension, and pericardial effusion. Patient hospitalized 11/22/15 - 11/29/15 for chest pain and pericardial effusion. ED visit on 04/24/15 for leg pain. ED visit on 08/20/15 for chest pain. ED visit on 11/11/15 for chest pain. Patient also has a history of hyperlipidemia.    Assessment: Received UMR Transition of care referral on 12/10/15. Transition of care follow up ongoing and telephone screen completed. Pain management appointment scheduling pending, MD review.   Plan:RNCM will follow up with Dr. Antoine Wood regarding pain clinic appointment within 10 business days, if no return call.  RNCM will call patient to verify outcome of 01/14/16 cardiologist visit, 01/15/16 gastroenterologist visit, and complete initial assessment, within 3 business weeks.    Alicia Padmanabhan H. Gardiner Barefootooper RN, BSN, CCM [facility] Care Management [facility] Telephonic CM Phone: [phone] Fax: [phone]

## 2016-01-03 NOTE — Patient Outreach (Addendum)
[facility](THN) Care Management  01/03/2016  Alicia Wood 03/10/1971 [id number]   Subjective: Telephone call to patient's home number, spoke with patient, and HIPAA verified.   States she feeling depressed, frustrated that she continues to be in pain, and it is causing her to have trouble sleeping. States she spoke with MD's office today and a prescription for Tramadol has been called into the pharmacy. She does not like to take medications, or going to hospital, but is willing to try Tramadol.   Has not had any pain medicine since taking the over the counter Tylenol on 01/01/16.  She states she has also been in contact with [name] regarding appointment, and was told that they would call her when an appointment was available.   RNCM advised patient of conversations with [name], appointment pending MD review, and new patient appointment availability.  Patient will continue to follow up on appointment scheduling and to notify primary MD of any changes or increase pain symptoms.   Patient states she has researched on line signs and symptoms of pericarditis, is aware that she will have pain but is unsure if what she is experiencing is normal.   States she is also concerned that she has similar symptoms to her mother who died 2 years ago from leukemia and it took providers awhile to confirm diagnosis.   RNCM advised patient to express of concerns to cardiologist and primary MD.   Patient voiced understanding, states she will follow up, and gave [facility] authorization to discuss with cardiologist.   States she did discuss her depression with primary MD, 12/27/15 visit went ok,  and the office called her today about a prescription for depression medication.  States she refused to take anymore medication and is depressed over her health issues, willing to continue to work through issues without medications.   States she will not want to harm self or others, and will  follow up with appropriate community resources as needed.   States she is depressed because the pain is preventing her to doing the things that she wants to do and is currently sitting at home in pain 5 out of 7 days.   States her goals are pain management, more education on the severity of her pericarditis, and to know that other serious conditions have been ruled out.  Patient in agreement to discuss her goals with primary MD, cardiologist and gastroenterologist.  Cardiologist appointment on 01/14/16 and gastroenterologist on 01/15/16. Patient has received EMMI educational materials and has no questions at this time.   Has received Advanced Directives information, will follow up as appropriate, and has no questions at this time.   Patient in agreement to continue to receive [facility] transition of care follow up.  Objective: Per chart review: Patient hospitalized 12/17/15 - 12/21/15 for pericarditis and pericardial effusion. Patient hospitalized 12/05/15 - 12/10/15 for chronic diastolic congestive heart failure, pulmonary hypertension, and pericardial effusion. Patient hospitalized 11/22/15 - 11/29/15 for chest pain and pericardial effusion. ED visit on 04/24/15 for leg pain. ED visit on 08/20/15 for chest pain. ED visit on 11/11/15 for chest pain. Patient also has a history of hyperlipidemia.    Assessment: Received UMR Transition of care referral on 12/10/15. Transition of care follow up ongoing and telephone screen completed. Pain management appointment scheduling pending, MD review.   Plan:RNCM will follow up with Dr. Antoine PocheHochrein regarding pain clinic appointment within 10 business days.  RNCM will call patient to verify outcome of 01/14/16  cardiologist visit, 01/15/16 gastroenterologist visit, and complete initial assessment, within 3 business weeks.    Kyair Ditommaso H. Gardiner Barefoot, BSN, CCM [name] Care Management [facility] Telephonic CM Phone: [phone]

## 2016-01-04 ENCOUNTER — Encounter (HOSPITAL_COMMUNITY): Payer: Self-pay

## 2016-01-04 ENCOUNTER — Emergency Department (HOSPITAL_COMMUNITY): Payer: 59

## 2016-01-04 ENCOUNTER — Emergency Department (HOSPITAL_COMMUNITY)
Admission: EM | Admit: 2016-01-04 | Discharge: 2016-01-04 | Disposition: A | Discharge status: Home / Self Care | Payer: 59 | Attending: Emergency Medicine | Admitting: Emergency Medicine

## 2016-01-04 DIAGNOSIS — Z5321 Procedure and treatment not carried out due to patient leaving prior to being seen by health care provider: Secondary | ICD-10-CM | POA: Insufficient documentation

## 2016-01-04 DIAGNOSIS — R079 Chest pain, unspecified: Secondary | ICD-10-CM | POA: Insufficient documentation

## 2016-01-04 LAB — BASIC METABOLIC PANEL
Anion gap: 9 (ref 5–15)
BUN: 22 mg/dL — ABNORMAL HIGH (ref 6–20)
CO2: 28 mmol/L (ref 22–32)
Calcium: 9.2 mg/dL (ref 8.9–10.3)
Chloride: 98 mmol/L — ABNORMAL LOW (ref 101–111)
Creatinine, Ser: 0.84 mg/dL (ref 0.44–1.00)
GFR calc Af Amer: >60 mL/min (ref >60)
GFR calc non Af Amer: >60 mL/min (ref >60)
Glucose, Bld: 158 mg/dL — ABNORMAL HIGH (ref 65–99)
Potassium: 4.2 mmol/L (ref 3.5–5.1)
Sodium: 135 mmol/L (ref 135–145)

## 2016-01-04 LAB — CBC
HCT: 35.6 % — ABNORMAL LOW (ref 36.0–46.0)
Hemoglobin: 11.5 g/dL — ABNORMAL LOW (ref 12.0–15.0)
MCH: 28.3 pg (ref 26.0–34.0)
MCHC: 32.3 g/dL (ref 30.0–36.0)
MCV: 87.5 fL (ref 78.0–100.0)
Platelets: 392 10*3/uL (ref 150–400)
RBC: 4.07 MIL/uL (ref 3.87–5.11)
RDW: 16.2 % — ABNORMAL HIGH (ref 11.5–15.5)
WBC: 11.7 10*3/uL — ABNORMAL HIGH (ref 4.0–10.5)

## 2016-01-04 LAB — I-STAT TROPONIN, ED: Troponin i, poc: 0 ng/mL (ref 0.00–0.08)

## 2016-01-04 NOTE — ED Triage Notes (Signed)
Per pT, Pt is coming from home with complaints of bilateral chest pain that is aching with dizziness, lightheadedness, nausea, vomiting, and weakness. Pt was recently released with pericarditis. Was seen by PCP yesterday and sent home with Tramadol. Reports no relief.

## 2016-01-04 NOTE — ED Notes (Signed)
Encouraged pt. To stay, She stated, "I cannot sit in these chairs. " Apoligized to the pt. For the wait and explained to her that we would get her back as soon as we can.  Pt. Stated, "I will come back if I need to."

## 2016-01-05 ENCOUNTER — Telehealth: Payer: Self-pay | Admitting: Physician Assistant

## 2016-01-05 NOTE — Telephone Encounter (Signed)
Paged by answering service, patient continues to have CP and SOB since discharge. Same no improvement or worsening. States compliant with medication. Seen in ER yesterday and labs, EKG and CXR however she left before evaluation by provider.   CXR showed improved pleural effusion. Can not rule out pneumonia. EKG without acute changes. Reviewed lab as well. Advised to continue to take current medication. Discussed imaging and labs finding--> Advised need to be fully evaluated by provider in person before making any decision. Go to ER for full evaluation. She prefers to stay home. Again address importance.  She will call PCP/Cardiology  For follow up Monday.

## 2016-01-06 ENCOUNTER — Emergency Department (HOSPITAL_COMMUNITY): Payer: 59

## 2016-01-06 ENCOUNTER — Emergency Department (HOSPITAL_COMMUNITY)
Admission: EM | Admit: 2016-01-06 | Discharge: 2016-01-06 | Disposition: A | Discharge status: Home / Self Care | Payer: 59 | Attending: Emergency Medicine | Admitting: Emergency Medicine

## 2016-01-06 ENCOUNTER — Encounter (HOSPITAL_COMMUNITY): Payer: Self-pay

## 2016-01-06 DIAGNOSIS — R079 Chest pain, unspecified: Secondary | ICD-10-CM

## 2016-01-06 DIAGNOSIS — R072 Precordial pain: Secondary | ICD-10-CM | POA: Diagnosis not present

## 2016-01-06 DIAGNOSIS — I319 Disease of pericardium, unspecified: Secondary | ICD-10-CM | POA: Diagnosis not present

## 2016-01-06 DIAGNOSIS — Z7982 Long term (current) use of aspirin: Secondary | ICD-10-CM | POA: Diagnosis not present

## 2016-01-06 DIAGNOSIS — I5032 Chronic diastolic (congestive) heart failure: Secondary | ICD-10-CM | POA: Insufficient documentation

## 2016-01-06 DIAGNOSIS — R0602 Shortness of breath: Secondary | ICD-10-CM | POA: Diagnosis not present

## 2016-01-06 DIAGNOSIS — I200 Unstable angina: Secondary | ICD-10-CM | POA: Diagnosis not present

## 2016-01-06 DIAGNOSIS — I110 Hypertensive heart disease with heart failure: Secondary | ICD-10-CM | POA: Diagnosis not present

## 2016-01-06 LAB — I-STAT BETA HCG BLOOD, ED (MC, WL, AP ONLY): I-stat hCG, quantitative: <5 m[IU]/mL (ref <5)

## 2016-01-06 LAB — BASIC METABOLIC PANEL
Anion gap: 10 (ref 5–15)
BUN: 20 mg/dL (ref 6–20)
CO2: 31 mmol/L (ref 22–32)
Calcium: 8.8 mg/dL — ABNORMAL LOW (ref 8.9–10.3)
Chloride: 98 mmol/L — ABNORMAL LOW (ref 101–111)
Creatinine, Ser: 0.91 mg/dL (ref 0.44–1.00)
GFR calc Af Amer: >60 mL/min (ref >60)
GFR calc non Af Amer: >60 mL/min (ref >60)
Glucose, Bld: 87 mg/dL (ref 65–99)
Potassium: 3.8 mmol/L (ref 3.5–5.1)
Sodium: 139 mmol/L (ref 135–145)

## 2016-01-06 LAB — D-DIMER, QUANTITATIVE: D-Dimer, Quant: 0.8 ug/mL-FEU — ABNORMAL HIGH (ref 0.00–0.50)

## 2016-01-06 LAB — CBC
HCT: 36.7 % (ref 36.0–46.0)
Hemoglobin: 11.7 g/dL — ABNORMAL LOW (ref 12.0–15.0)
MCH: 28.1 pg (ref 26.0–34.0)
MCHC: 31.9 g/dL (ref 30.0–36.0)
MCV: 88 fL (ref 78.0–100.0)
Platelets: 395 10*3/uL (ref 150–400)
RBC: 4.17 MIL/uL (ref 3.87–5.11)
RDW: 16.3 % — ABNORMAL HIGH (ref 11.5–15.5)
WBC: 9.7 10*3/uL (ref 4.0–10.5)

## 2016-01-06 LAB — I-STAT TROPONIN, ED
Troponin i, poc: 0 ng/mL (ref 0.00–0.08)
Troponin i, poc: 0.01 ng/mL (ref 0.00–0.08)

## 2016-01-06 LAB — SEDIMENTATION RATE: Sed Rate: 16 mm/hr (ref 0–22)

## 2016-01-06 MED ORDER — OXYCODONE-ACETAMINOPHEN 5-325 MG PO TABS: 2.0000 | ORAL_TABLET | Freq: Once | ORAL | Status: DC

## 2016-01-06 MED ORDER — OXYCODONE-ACETAMINOPHEN 5-325 MG PO TABS: 1.0000 | ORAL_TABLET | ORAL | 0 refills | Status: DC | PRN

## 2016-01-06 MED ORDER — KETOROLAC TROMETHAMINE 30 MG/ML IJ SOLN
30.0000 mg | Freq: Once | INTRAMUSCULAR | Status: AC
  Administered 2016-01-06: 30 mg via INTRAVENOUS
  Filled 2016-01-06: qty 1

## 2016-01-06 MED ORDER — FENTANYL CITRATE (PF) 100 MCG/2ML IJ SOLN
50.0000 ug | Freq: Once | INTRAMUSCULAR | Status: AC
  Administered 2016-01-06: 50 ug via INTRAVENOUS
  Filled 2016-01-06: qty 2

## 2016-01-06 MED ORDER — IOPAMIDOL (ISOVUE-370) INJECTION 76%
INTRAVENOUS | Status: AC
Start: 2016-01-06 — End: 2016-01-06
  Administered 2016-01-06: 75 mL
  Filled 2016-01-06: qty 100

## 2016-01-06 MED ORDER — POLYETHYLENE GLYCOL 3350 17 GM/SCOOP PO POWD: 17.0000 g | Freq: Every day | ORAL | 0 refills | Status: DC

## 2016-01-06 MED ORDER — SODIUM CHLORIDE 0.9 % IV BOLUS (SEPSIS)
1000.0000 mL | Freq: Once | INTRAVENOUS | Status: AC
  Administered 2016-01-06: 1000 mL via INTRAVENOUS

## 2016-01-06 MED ORDER — ONDANSETRON HCL 4 MG/2ML IJ SOLN
4.0000 mg | Freq: Once | INTRAMUSCULAR | Status: AC
  Administered 2016-01-06: 4 mg via INTRAVENOUS
  Filled 2016-01-06: qty 2

## 2016-01-06 MED ORDER — HYDROMORPHONE HCL 1 MG/ML IJ SOLN
1.0000 mg | Freq: Once | INTRAMUSCULAR | Status: AC
  Administered 2016-01-06: 1 mg via INTRAVENOUS
  Filled 2016-01-06: qty 1

## 2016-01-06 NOTE — ED Triage Notes (Signed)
Pt reports she was here this week and left due to the wait. She has had intermittent chest pain with shortness of breath for a week. Pt reports she was told to come back if pain did not improve.

## 2016-01-06 NOTE — ED Notes (Signed)
Patient returned from CT

## 2016-01-06 NOTE — Discharge Instructions (Signed)
Minimize activities. Follow up with the ear nose and throat doctor on Tuesday. Call the office tomorrow morning for an appointment. Remind them that you're in the emergency department.

## 2016-01-06 NOTE — ED Provider Notes (Signed)
[facility]-EM[facility]ENCY DEPT Provider Note   CSN: [id number] Arrival date & time: 01/06/16  0908     History   Chief Complaint Chief Complaint  Patient presents with  . Chest Pain    HPI Alicia Wood is a 45 y.o. female.  Stabbing right-sided chest pain for 2 weeks with associated dyspnea. Patient has had admissions on 9/7, 9/18, 12/21/15 for pericarditis and a pericardial effusion.  She is presently taking prednisone and colchicine. She has been seen by [facility] cardiology.  Symptoms are intermittent. No fever, sweats, chills, productive cough, substernal chest pain      Past Medical History:  Diagnosis Date  . CHF (congestive heart failure) (HCC)   . Hyperlipidemia   . Hypertension   . Pericardial effusion     Patient Active Problem List   Diagnosis Date Noted  . Pericarditis 12/17/2015  . Constrictive pericarditis   . Chronic diastolic CHF (congestive heart failure) (HCC)   . Pulmonary hypertension   . Pain in the chest 12/05/2015  . Hypotension 12/05/2015  . Arterial hypotension   . Pericardial effusion 11/22/2015    Past Surgical History:  Procedure Laterality Date  . ABDOMINAL HYSTERECTOMY    . APPENDECTOMY    . PARTIAL HYSTERECTOMY    . TUBAL LIGATION      OB History    No data available       Home Medications    Prior to Admission medications   Medication Sig Start Date End Date Taking? Authorizing Provider  aspirin EC 81 MG tablet Take 1 tablet (81 mg total) by mouth daily. 12/21/15  Yes Little Ishikawa, NP  colchicine 0.6 MG tablet Take 1 tablet (0.6 mg total) by mouth 2 (two) times daily. 12/21/15  Yes Little Ishikawa, NP  HYDROcodone-acetaminophen (NORCO/VICODIN) 5-325 MG tablet Take 1 tablet by mouth every 6 (six) hours as needed for moderate pain.   Yes Historical Provider, MD  pantoprazole (PROTONIX) 40 MG tablet Take 1 tablet (40 mg total) by mouth daily. 12/11/15  Yes Belkys A Regalado, MD  polyethylene glycol (MIRALAX / GLYCOLAX) packet Take 17 g by  mouth 2 (two) times daily. 12/10/15  Yes Belkys A Regalado, MD  predniSONE (DELTASONE) 10 MG tablet Take 80mg  daily x 7 days, 70mg  daily x 7 days, 60mg  x 7 days, 50mg  x 7 days, 40mg  x 7 days, 30mg  x 7 days, 20mg  x 7 days, 10mg  x 7 days. 12/21/15  Yes Little Ishikawa, NP  senna-docusate (SENOKOT-S) 8.6-50 MG tablet Take 1 tablet by mouth 2 (two) times daily. 12/10/15  Yes Belkys A Regalado, MD  traMADol (ULTRAM) 50 MG tablet Take 50 mg by mouth 3 (three) times daily as needed for moderate pain.   Yes Historical Provider, MD  fluconazole (DIFLUCAN) 200 MG tablet Take 1 tablet (200 mg total) by mouth daily. Patient not taking: Reported on 01/06/2016 12/22/15   Little Ishikawa, NP  ibuprofen (ADVIL,MOTRIN) 600 MG tablet Take 1 tablet (600 mg total) by mouth 3 (three) times daily. Patient not taking: Reported on 01/06/2016 12/24/15   Arty Baumgartner, NP    Family History Family History  Problem Relation Age of Onset  . Hypertension Father     Social History Social History  Substance Use Topics  . Smoking status: Never Smoker  . Smokeless tobacco: Never Used  . Alcohol use Yes     Comment: occasional     Allergies   Review of patient's allergies indicates no known allergies.   Review  of Systems Review of Systems  All other systems reviewed and are negative.    Physical Exam Updated Vital Signs BP 107/74   Pulse 87   Temp 97.7 F (36.5 C) (Oral)   Resp 22   SpO2 100%   Physical Exam  Constitutional: She is oriented to person, place, and time.  Obese, appears to be in pain.  HENT:  Head: Normocephalic and atraumatic.  Eyes: Conjunctivae are normal.  Neck: Neck supple.  Cardiovascular: Normal rate and regular rhythm.   Pulmonary/Chest: Effort normal and breath sounds normal.  Abdominal: Soft. Bowel sounds are normal.  Musculoskeletal: Normal range of motion.  Neurological: She is alert and oriented to person, place, and time.  Skin: Skin is warm and dry.  Psychiatric: She has  a normal mood and affect. Her behavior is normal.  Nursing note and vitals reviewed.    ED Treatments / Results  Labs (all labs ordered are listed, but only abnormal results are displayed) Labs Reviewed  BASIC METABOLIC PANEL - Abnormal; Notable for the following:       Result Value   Chloride 98 (*)    Calcium 8.8 (*)    All other components within normal limits  CBC - Abnormal; Notable for the following:    Hemoglobin 11.7 (*)    RDW 16.3 (*)    All other components within normal limits  D-DIMER, QUANTITATIVE (NOT AT [facility]) - Abnormal; Notable for the following:    D-Dimer, Quant 0.80 (*)    All other components within normal limits  SEDIMENTATION RATE  I-STAT TROPOININ, ED  I-STAT TROPOININ, ED  I-STAT BETA HCG BLOOD, ED ([facility], [facility], [facility] ONLY)    EKG  EKG Interpretation  Date/Time:  Sunday January 06 2016 09:16:24 EDT Ventricular Rate:  90 PR Interval:  138 QRS Duration: 72 QT Interval:  360 QTC Calculation: 440 R Axis:   20 Text Interpretation:  Normal sinus rhythm Possible Anterior infarct , age undetermined Abnormal ECG Confirmed by Adriana SimasOOK  MD, Jakeel Starliper ([id number]) on 01/06/2016 12:30:20 PM       Radiology Dg Chest 2 View  Result Date: 01/06/2016 CLINICAL DATA:  Intermittent chest pain and shortness of breath for a week. EXAM: CHEST  2 VIEW COMPARISON:  01/04/2016 FINDINGS: Low volume film. Left greater than right basilar atelectasis/ infiltrate with small left pleural effusion, as before. The cardio pericardial silhouette is enlarged. The visualized bony structures of the thorax are intact. IMPRESSION: Low volume film with cardiomegaly and stable left greater than right atelectasis/ infiltrate with effusion. Electronically Signed   By: Kennith CenterEric  Mansell M.D.   On: 01/06/2016 10:17    Procedures Procedures (including critical care time)  Medications Ordered in ED Medications  sodium chloride 0.9 % bolus 1,000 mL (0 mLs Intravenous Stopped 01/06/16 1212)  ondansetron (ZOFRAN)  injection 4 mg (4 mg Intravenous Given 01/06/16 1105)  ketorolac (TORADOL) 30 MG/ML injection 30 mg (30 mg Intravenous Given 01/06/16 1108)  fentaNYL (SUBLIMAZE) injection 50 mcg (50 mcg Intravenous Given 01/06/16 1222)     Initial Impression / Assessment and Plan / ED Course  I have reviewed the triage vital signs and the nursing notes.  Pertinent labs & imaging results that were available during my care of the patient were reviewed by me and considered in my medical decision making (see chart for details).  Clinical Course  Screening test show no life-threatening condition. Troponin negative 2. EKG shows no acute changes. Sed rate has normalized. Chest x-ray shows left greater than  right atelectasis/infiltrate with effusion. Will obtain CT angio chest to clarify. Discussed with Dr. Patria Mane.    Final Clinical Impressions(s) / ED Diagnoses   Final diagnoses:  Chest pain, unspecified type    New Prescriptions New Prescriptions   No medications on file     Donnetta Hutching, MD 01/06/16 1706

## 2016-01-06 NOTE — ED Provider Notes (Signed)
7:25 PM Patient is well-appearing and feels better this time.  Her vital signs are normal.  Her CT angiogram demonstrates no PE or other lung pathology.  She has a known pericardial effusion which is consistent with prior.  Ongoing outpatient follow-up with cardiology for management of her chest pain.   Dg Chest 2 View  Result Date: 01/06/2016 CLINICAL DATA:  Intermittent chest pain and shortness of breath for a week. EXAM: CHEST  2 VIEW COMPARISON:  01/04/2016 FINDINGS: Low volume film. Left greater than right basilar atelectasis/ infiltrate with small left pleural effusion, as before. The cardio pericardial silhouette is enlarged. The visualized bony structures of the thorax are intact. IMPRESSION: Low volume film with cardiomegaly and stable left greater than right atelectasis/ infiltrate with effusion. Electronically Signed   By: Kennith CenterEric  Mansell M.D.   On: 01/06/2016 10:17   Dg Chest 2 View  Result Date: 01/04/2016 CLINICAL DATA:  Chest pain with dizziness and lightheadedness. Nausea, vomiting and weakness. EXAM: CHEST  2 VIEW COMPARISON:  12/17/2015 FINDINGS: Cardiomegaly persists. Aortic atherosclerosis. Pleural effusion seen on the left is smaller. There are small bilateral effusions today. There is mild atelectasis at both lung bases. One could not rule out basilar pneumonia. There is pulmonary venous hypertension without frank edema. IMPRESSION: Cardiomegaly and pulmonary venous hypertension. Small effusions and basilar atelectasis. Less pleural fluid than was seen 3 weeks ago. 1 could not rule out pneumonia at the lung bases. Electronically Signed   By: Paulina FusiMark  Shogry M.D.   On: 01/04/2016 15:32   Dg Chest 2 View  Result Date: 12/17/2015 CLINICAL DATA:  Patient with chest pressure and tightness. Recent history of pericarditis. EXAM: CHEST  2 VIEW COMPARISON:  Chest radiograph 12/05/2015. FINDINGS: Low lung volumes. Stable enlarged cardiac and mediastinal contours. Persistent consolidation within  the left lower hemi thorax. Small left pleural effusion. No pneumothorax. Thoracic spine degenerative changes. IMPRESSION: Consolidation within the left lower hemi thorax with associated pleural fluid. Infection not excluded. Mild cardiomegaly. Electronically Signed   By: Annia Beltrew  Davis M.D.   On: 12/17/2015 12:01   Ct Angio Chest Pe W And/or Wo Contrast  Result Date: 01/06/2016 CLINICAL DATA:  Chronic chest pain for 2 months EXAM: CT ANGIOGRAPHY CHEST WITH CONTRAST TECHNIQUE: Multidetector CT imaging of the chest was performed using the standard protocol during bolus administration of intravenous contrast. Multiplanar CT image reconstructions and MIPs were obtained to evaluate the vascular anatomy. CONTRAST:  75 mL Isovue 370 COMPARISON:  12/05/2015, 05/29/2009 FINDINGS: Cardiovascular: Satisfactory opacification of the pulmonary arteries to the segmental level. No evidence of pulmonary embolism. Normal heart size. Small-moderate pericardial effusion. Normal caliber thoracic aorta. No thoracic aortic dissection. Mediastinum/Nodes: No enlarged mediastinal, hilar, or axillary lymph nodes. Thyroid gland, trachea, and esophagus demonstrate no significant findings. Lungs/Pleura: Small bilateral pleural effusions. Bibasilar airspace disease likely reflecting atelectasis. No pneumothorax. Upper Abdomen: No acute abnormality. Musculoskeletal: No chest wall abnormality. No acute or significant osseous findings. Review of the MIP images confirms the above findings. IMPRESSION: 1. No pulmonary embolus. 2. No thoracic aortic dissection. 3. Stable small-moderate pericardial effusion. This can be further evaluated by a echocardiography if there is clinical concern regarding hemodynamic status. 4. Small bilateral pleural effusions with dependent atelectasis. Electronically Signed   By: Elige KoHetal  Patel   On: 01/06/2016 18:40      Azalia BilisKevin Ezelle Surprenant, MD 01/06/16 1925

## 2016-01-06 NOTE — ED Notes (Signed)
Doctor at bedside.

## 2016-01-06 NOTE — ED Notes (Signed)
Patient transported to CT 

## 2016-01-09 ENCOUNTER — Ambulatory Visit: Payer: Self-pay | Admitting: *Deleted

## 2016-01-09 ENCOUNTER — Other Ambulatory Visit: Payer: Self-pay | Admitting: *Deleted

## 2016-01-09 NOTE — Patient Outreach (Addendum)
[facility] Care Management  01/09/2016  Alicia Wood 10/09/1970 [id number]  Subjective: Telephone call to patient's home number, no answer, left HIPAA compliant voicemail message, and requested call back.   Objective: Per chart review: Patient hospitalized 12/17/15 - 12/21/15 for pericarditis and pericardial effusion. Patient hospitalized 12/05/15 - 12/10/15 for chronic diastolic congestive heart failure, pulmonary hypertension, and pericardial effusion. Patient hospitalized 11/22/15 - 11/29/15 for chest pain and pericardial effusion. ED visit on 04/24/15 for leg pain. ED visit on 08/20/15 for chest pain. ED visit on 11/11/15 for chest pain. Patient also has a history of hyperlipidemia.    Assessment: Received [facility] Transition of care referral on 12/10/15. Transition of care follow up ongoing and telephone screen completed. Pain management appointment scheduling pending, MD review.   Plan:RNCM will call patient for 2nd telephone outreach attempt, transition of care follow up, within 10 business days if no return call. RNCM will follow up with Dr. Antoine PocheHochrein regarding pain clinic appointment within 10 business days, if no return call.  RNCM will call patient to verify outcome of 01/14/16 cardiologistvisit, 01/15/16 gastroenterologist visit, and complete initial assessment, within 3 business weeks.    Reeta Kuk H. Gardiner Barefootooper RN, BSN, CCM [facility] Care Management [facility] Telephonic CM Phone: [phone] Fax: [phone]

## 2016-01-14 ENCOUNTER — Other Ambulatory Visit: Payer: Self-pay | Admitting: *Deleted

## 2016-01-14 ENCOUNTER — Encounter: Payer: Self-pay | Admitting: Cardiovascular Disease

## 2016-01-14 ENCOUNTER — Other Ambulatory Visit: Payer: Self-pay | Admitting: Physician Assistant

## 2016-01-14 ENCOUNTER — Ambulatory Visit (INDEPENDENT_AMBULATORY_CARE_PROVIDER_SITE_OTHER): Payer: 59 | Admitting: Physician Assistant

## 2016-01-14 ENCOUNTER — Encounter: Payer: Self-pay | Admitting: Physician Assistant

## 2016-01-14 ENCOUNTER — Encounter: Payer: Self-pay | Admitting: *Deleted

## 2016-01-14 VITALS — BP 127/85 | HR 77 | Ht 68.0 in | Wt 287.0 lb

## 2016-01-14 DIAGNOSIS — Z01818 Encounter for other preprocedural examination: Secondary | ICD-10-CM

## 2016-01-14 DIAGNOSIS — I309 Acute pericarditis, unspecified: Secondary | ICD-10-CM

## 2016-01-14 DIAGNOSIS — E785 Hyperlipidemia, unspecified: Secondary | ICD-10-CM

## 2016-01-14 DIAGNOSIS — I10 Essential (primary) hypertension: Secondary | ICD-10-CM | POA: Diagnosis not present

## 2016-01-14 DIAGNOSIS — I313 Pericardial effusion (noninflammatory): Secondary | ICD-10-CM

## 2016-01-14 DIAGNOSIS — I3139 Other pericardial effusion (noninflammatory): Secondary | ICD-10-CM

## 2016-01-14 MED ORDER — FUROSEMIDE 20 MG PO TABS: 20.0000 mg | ORAL_TABLET | Freq: Every day | ORAL | 3 refills | Status: DC

## 2016-01-14 NOTE — Patient Outreach (Signed)
[facility] Care Management  01/14/2016  Alicia Wood 11/12/1970 [id number]   Subjective: Received Epic Ruel Favors/[facility] in basket message from [name] at Dr. Jenene SlickerHochrein's office, states Dr. Antoine PocheHochrein has not spoken to anyone at [facility].   States Dr. Antoine PocheHochrein is requesting the name and phone number of RNCM's contact person at pain management clinic and he will follow up with the clinic.  RNCM sent in basket message with requested information to Dr. Jenene SlickerHochrein's in basket. [facility] Transition of care follow up.  Objective: Per chart review: Patient hospitalized 12/17/15 - 12/21/15 for pericarditis and pericardial effusion. Patient hospitalized 12/05/15 - 12/10/15 for chronic diastolic congestive heart failure, pulmonary hypertension, and pericardial effusion. Patient hospitalized 11/22/15 - 11/29/15 for chest pain and pericardial effusion. ED visit on 04/24/15 for leg pain. ED visit on 08/20/15 for chest pain. ED visit on 11/11/15 for chest pain. Patient also has a history of hyperlipidemia.    Assessment: Received [facility] Transition of care referral on 12/10/15. Transition of care follow up ongoing and telephone screen completed. Pain management appointment scheduling pending, MD review.   Plan:RNCM will call patient for 2nd telephone outreach attempt, transition of care follow up, within 10 business days if no return call. RNCM will call patient to verify outcome of 01/14/16 cardiologistvisit, 01/15/16 gastroenterologist visit, and complete initial assessment, within 3 business weeks.    Zona Pedro H. Gardiner Barefootooper RN, BSN, CCM [facility] Care Management [facility] Telephonic CM Phone: [phone] Fax: [phone]

## 2016-01-14 NOTE — Patient Instructions (Signed)
Medication Instructions:  Your physician has recommended you make the following change in your medication:  1- START TAKING Lasix (furosemide) 20 mg (1 tablet) by mouth once a day as needed.   Labwork: Your physician recommends that you return for lab work WITHIN 14 DAYS OF HEART CATHETERIZATION.   Testing/Procedures: Your physician has requested that you have a RIGHT AND LEFT HEART cardiac catheterization. Cardiac catheterization is used to diagnose and/or treat various heart conditions. Doctors may recommend this procedure for a number of different reasons. The most common reason is to evaluate chest pain. Chest pain can be a symptom of coronary artery disease (CAD), and cardiac catheterization can show whether plaque is narrowing or blocking your heart's arteries. This procedure is also used to evaluate the valves, as well as measure the blood flow and oxygen levels in different parts of your heart. For further information please visit [email or web address] Please follow instruction sheet, as given.    Follow-Up: You have been referred to PAIN CLINIC FOR EVALUATION.  Your physician recommends that you schedule a follow-up appointment in: 4 WEEKS WITH DR [facility].   Any Other Special Instructions Will Be Listed Below (If Applicable).   Coronary Angiogram A coronary angiogram, also called coronary angiography, is an X-ray procedure used to look at the arteries in the heart. In this procedure, a dye (contrast dye) is injected through a long, hollow tube (catheter). The catheter is about the size of a piece of cooked spaghetti and is inserted through your groin, wrist, or arm. The dye is injected into each artery, and X-rays are then taken to show if there is a blockage in the arteries of your heart. LET [facility] CARE PROVIDER KNOW ABOUT:  Any allergies you have, including allergies to shellfish or contrast dye.   All medicines you are taking, including vitamins, herbs, eye drops,  creams, and over-the-counter medicines.   Previous problems you or members of your family have had with the use of anesthetics.   Any blood disorders you have.   Previous surgeries you have had.  History of kidney problems or failure.   Other medical conditions you have. RISKS AND COMPLICATIONS  Generally, a coronary angiogram is a safe procedure. However, problems can occur and include:  Allergic reaction to the dye.  Bleeding from the access site or other locations.  Kidney injury, especially in people with impaired kidney function.  Stroke (rare).  Heart attack (rare). BEFORE THE PROCEDURE   Do not eat or drink anything after midnight the night before the procedure or as directed by your health care provider.   Ask your health care provider about changing or stopping your regular medicines. This is especially important if you are taking diabetes medicines or blood thinners. PROCEDURE  You may be given a medicine to help you relax (sedative) before the procedure. This medicine is given through an intravenous (IV) access tube that is inserted into one of your veins.   The area where the catheter will be inserted will be washed and shaved. This is usually done in the groin but may be done in the fold of your arm (near your elbow) or in the wrist.   A medicine will be given to numb the area where the catheter will be inserted (local anesthetic).   The health care provider will insert the catheter into an artery. The catheter will be guided by using a special type of X-ray (fluoroscopy) of the blood vessel being examined.   A special dye will  then be injected into the catheter, and X-rays will be taken. The dye will help to show where any narrowing or blockages are located in the heart arteries.  AFTER THE PROCEDURE   If the procedure is done through the leg, you will be kept in bed lying flat for several hours. You will be instructed to not bend or cross your  legs.  The insertion site will be checked frequently.   The pulse in your feet or wrist will be checked frequently.   Additional blood tests, X-rays, and an electrocardiogram may be done.    This information is not intended to replace advice given to you by your health care provider. Make sure you discuss any questions you have with your health care provider.   Document Released: 09/14/2002 Document Revised: 03/31/2014 Document Reviewed: 08/02/2012 Elsevier Interactive Patient Education [employer]2016 [employer].    If you need a refill on your cardiac medications before your next appointment, please call your pharmacy.

## 2016-01-14 NOTE — Progress Notes (Addendum)
Cardiology Office Note    Date:  01/14/2016   ID:  Alicia Wood, DOB 01/11/1971, MRN [id number]  PCP:  Darrow BussingKOIRALA,DIBAS, MD  Cardiologist: Dr. Hyannis LionsHochrien   Chief Complaint  Patient presents with  . Hospitalization Follow-up    seen for Dr. Antoine PocheHochrein    History of Present Illness:  Alicia Wood is a 45 y.o. female with PMH of HTN, HLD, and pericardial effusion. She was originally admitted from 8/31-9/7 when she presented complaining of progressive shortness of breath and chest discomfort and found to have moderate to large pericardial effusion on CT scan. Her TSH was normal. CBC showed some anemia but no active bleeding. Guaiac stool was negative. She was placed on colchicine and a set up for possible tap the effusion if she did not improve with medical therapy. Follow-up echocardiogram showed moderate to large circumferential pericardial effusion but no tamponade, her colchicine was increased and ibuprofen was added. She was placed on steroid and PPI for brief period of time. She had elevated CRP and sedimentation rate consistent with pericarditis. Steroid was ultimately stopped and the naproxen was added to her medical regimen. She had a repeat echocardiogram on 9/5 that showed decreasing size of effusion and no temponade.   She was seen in the hospital again on 12/05/2015 after developing recurrent chest discomfort. He was given 2 sublingual nitroglycerin by EMS and was noted to be hypotensive. Blood pressure was in the 70s systolic on arrival. She was treated with IV fluid. No sinus rhythm without acute ST-T wave changes. Patient was admitted to hospitalist service, cardiology was consulted on 12/05/2015. Echocardiogram was repeated in the ED on the same day which showed EF 55-60%, grade 2 diastolic dysfunction, moderately reduced RV EF, PA peak pressure 37 mmHg, small circumferential pericardial effusion was noted without hemodynamic compromise. She underwent cardiac MRI which showed normal  EF 57%, RVEF 55%, mild amount of pericardial effusion predominantly in the proximity of the right ventricle, and the pericardial focally up to 8 mm, and a significant circumferential late gadolinium enhancement of the pericardium and epicardium. 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 She was treated with IV Solu-Medrol in the hospital, and he was eventually discharged on oral prednisone with plan for repeat CMR or right heart cath in 4-6 weeks. She has had extensive workup including negative HIV, Quantiferon TB, ASA or RF. She was discharged on steroid taper.  I last saw her on 12/14/2015, she was still having some chest discomfort that time. She also has some increased edema which I think was related to steroid. The origin of this tooth reassess her symptom in 3-4 weeks before deciding whether she will require another hardy mri versus right heart cath. unfortunately since i saw her, she has been seen in the hospital for at least 3 times. CRP was elevated at 17.2. Lactic acid and troponin were normal. Sedimentation rate 39. During the hospitalization, she also developed trouble swallowing pills and food, given the prior use of steroid, the concern was she may have developed esophageal candidiasis. She was prophylactically treated with a course of Diflucan. Echocardiogram was repeated on 12/19/2015 which showed ejection fraction 55-60%, no regional wall motion abnormality, trivial pericardial effusion without sign of temperature normal. She presented to the emergency room on 10/13, however left before she could be evaluated by EDP. He returned to the ED on 01/06/2016 with complaint of chest pain again. D-dimer was elevated at 0.8. CT angiogram of the  chest demonstrated no PE or other lung pathology. Sedimentation rate has normalized.  Patient is frustrated today, she does not understand why she keeps having chest pain. It has been going on  for several weeks now. She says she is unable to lay flat at night. We did note that her sedimentation rate has returned back to normal. She has went back to the emergency room multiple times. I have discussed with Dr. [name], we will consider left and right heart cath. Of note, there is still the question of possible constrictive pericarditis. We will see what the right heart cath shows in this case.    Past Medical History:  Diagnosis Date  . CHF (congestive heart failure) (HCC)   . Hyperlipidemia   . Hypertension   . Pericardial effusion     Past Surgical History:  Procedure Laterality Date  . ABDOMINAL HYSTERECTOMY    . APPENDECTOMY    . PARTIAL HYSTERECTOMY    . TUBAL LIGATION      Current Medications: Outpatient Medications Prior to Visit  Medication Sig Dispense Refill  . aspirin EC 81 MG tablet Take 1 tablet (81 mg total) by mouth daily.    . colchicine 0.6 MG tablet Take 1 tablet (0.6 mg total) by mouth 2 (two) times daily. 60 tablet 1  . pantoprazole (PROTONIX) 40 MG tablet Take 1 tablet (40 mg total) by mouth daily. 30 tablet 0  . polyethylene glycol powder (MIRALAX) powder Take 17 g by mouth daily. 255 g 0  . predniSONE (DELTASONE) 10 MG tablet Take 80mg  daily x 7 days, 70mg  daily x 7 days, 60mg  x 7 days, 50mg  x 7 days, 40mg  x 7 days, 30mg  x 7 days, 20mg  x 7 days, 10mg  x 7 days. 252 tablet 0  . senna-docusate (SENOKOT-S) 8.6-50 MG tablet Take 1 tablet by mouth 2 (two) times daily. 30 tablet 0  . traMADol (ULTRAM) 50 MG tablet Take 50 mg by mouth 3 (three) times daily as needed for moderate pain.    . fluconazole (DIFLUCAN) 200 MG tablet Take 1 tablet (200 mg total) by mouth daily. (Patient not taking: Reported on 01/06/2016) 14 tablet 0  . HYDROcodone-acetaminophen (NORCO/VICODIN) 5-325 MG tablet Take 1 tablet by mouth every 6 (six) hours as needed for moderate pain.    Marland Kitchen ibuprofen (ADVIL,MOTRIN) 600 MG tablet Take 1 tablet (600 mg total) by mouth 3 (three) times daily.  (Patient not taking: Reported on 01/06/2016) 30 tablet 0  . oxyCODONE-acetaminophen (PERCOCET/ROXICET) 5-325 MG tablet Take 1 tablet by mouth every 4 (four) hours as needed for severe pain. 15 tablet 0   No facility-administered medications prior to visit.      Allergies:   Review of patient's allergies indicates no known allergies.   Social History   Social History  . Marital status: Legally Separated    Spouse name: N/A  . Number of children: N/A  . Years of education: N/A   Social History Main Topics  . Smoking status: Never Smoker  . Smokeless tobacco: Never Used  . Alcohol use Yes     Comment: occasional  . Drug use: No  . Sexual activity: Not Asked   Other Topics Concern  . None   Social History Narrative  . None     Family History:  The patient's family history includes Hypertension in her father.   ROS:   Please see the history of present illness.    ROS All other systems reviewed and are negative.   PHYSICAL  EXAM:   VS:  BP 127/85   Pulse 77   Ht 5\' 8"  (1.727 m)   Wt 287 lb (130.2 kg)   SpO2 99%   BMI 43.64 kg/m    GEN: Well nourished, well developed, in no acute distress  HEENT: normal  Neck: no JVD, carotid bruits, or masses Cardiac: RRR; no murmurs, rubs, or gallops,no edema  Respiratory:  clear to auscultation bilaterally, normal work of breathing GI: soft, nontender, nondistended, + BS MS: no deformity or atrophy  Skin: warm and dry, no rash Neuro:  Alert and Oriented x 3, Strength and sensation are intact Psych: euthymic mood, full affect  Wt Readings from Last 3 Encounters:  01/14/16 287 lb (130.2 kg)  01/04/16 268 lb (121.6 kg)  12/21/15 268 lb (121.6 kg)      Studies/Labs Reviewed:   EKG:  EKG is not ordered today.    Recent Labs: 11/22/2015: TSH 0.273 12/07/2015: Magnesium 2.0 12/08/2015: ALT 17 01/06/2016: BUN 20; Creatinine, Ser 0.91; Hemoglobin 11.7; Platelets 395; Potassium 3.8; Sodium 139   Lipid Panel No results found  for: CHOL, TRIG, HDL, CHOLHDL, VLDL, LDLCALC, LDLDIRECT  Additional studies/ records that were reviewed today include:    Echo 12/05/2015 LV EF: 55% - 60%  ------------------------------------------------------------------- Indications: Pericardial effusion 423.9.  ------------------------------------------------------------------- History: PMH: Shortness of Breath. Congestive heart failure. Risk factors: Hypertension. Dyslipidemia.  ------------------------------------------------------------------- Study Conclusions  - Left ventricle: The cavity size was normal. Systolic function was normal. The estimated ejection fraction was in the range of 55% to 60%. Wall motion was normal; there were no regional wall motion abnormalities. Features are consistent with a pseudonormal left ventricular filling pattern, with concomitant abnormal relaxation and increased filling pressure (grade 2 diastolic dysfunction). - Right ventricle: The cavity size was mildly dilated. Wall thickness was normal. Systolic function was moderately reduced. - Pulmonary arteries: Systolic pressure was mildly increased. PA peak pressure: 37 mm Hg (S). - Pericardium, extracardiac: A small pericardial effusion was identified circumferential to the heart. There was no evidence of hemodynamic compromise.  Impressions:  - When compared to prior, pericardial effusion is unchanged. Cardiac tamponade remains a bedside diagnosis. There is no RV or RA collpase.   Cardiac MRI 12/06/2015 IMPRESSION: 1. Normal left ventricular size, thickness and systolic function (LVEF = 57%) with no regional wall motion abnormalities.  2. Normal right ventricular size, thickness and systolic function (RVEF = 55%) with no regional wall motion abnormalities.  3. Mild left atrial dilatation.  4. Mild mitral and trivial tricuspid regurgitation.  5. There is mild amount of  pericardial effusion predominantly in the proximity to the right ventricle.  6. There is thickening of the pericardium focally up to 8 mm, and significant circumferential late gadolinium enhancement of the pericardium and epicardium. An IVC dilatation with no respiratory variation is seen.  There is also double bounce of the interventricular septum in diastole.  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 A right sided cardiac cath is Recommended.    Echo 12/19/2015 LV EF: 55% -   60%  - Left ventricle: The cavity size was normal. Systolic function was   normal. The estimated ejection fraction was in the range of 55%   to 60%. Wall motion was normal; there were no regional wall   motion abnormalities. The study is not technically sufficient to   allow evaluation of LV diastolic function. - Aortic valve: Trileaflet; normal thickness leaflets. There was no   regurgitation. - Aortic root: The  aortic root was normal in size. - Mitral valve: There was no regurgitation. - Left atrium: The atrium was normal in size. - Right ventricle: Systolic function was normal. - Right atrium: The atrium was normal in size. - Tricuspid valve: There was no regurgitation. - Pulmonary arteries: Systolic pressure was within the normal   range. - Inferior vena cava: The vessel was normal in size. - Pericardium, extracardiac: A trivial pericardial effusion was   identified. Features were not consistent with tamponade   physiology.   CTA of chest 01/06/2016 IMPRESSION: 1. No pulmonary embolus. 2. No thoracic aortic dissection. 3. Stable small-moderate pericardial effusion. This can be further evaluated by a echocardiography if there is clinical concern regarding hemodynamic status. 4. Small bilateral pleural effusions with dependent atelectasis.    ASSESSMENT:    1. Acute pericarditis, unspecified type   2. Pericardial effusion   3.  Essential hypertension   4. Hyperlipidemia, unspecified hyperlipidemia type   5. Preoperative testing      PLAN:  In order of problems listed above:  1. 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 She has had persistent chest discomfort for more than a month now, I have discussed with her primary cardiologist, we plan to proceed with left and right heart cath.  - Risk and benefit of procedure explained to the patient who display clear understanding and agree to proceed. Discussed with patient possible procedural risk include bleeding, vascular injury, renal injury, arrythmia, MI, stroke and loss of limb or life.  - We have also referred the patient to the pain clinic. She also complained of weight gain, especially now she is on a daily dose of steroid. She does have some mild fluid accumulation in the lower extremity, I put her on an as needed dose of Lasix.  2. HTN: Blood pressure well controlled    Medication Adjustments/Labs and Tests Ordered: Current medicines are reviewed at length with the patient today.  Concerns regarding medicines are outlined above.  Medication changes, Labs and Tests ordered today are listed in the Patient Instructions below. Patient Instructions  Medication Instructions:  Your physician has recommended you make the following change in your medication:  1- START TAKING Lasix (furosemide) 20 mg (1 tablet) by mouth once a day as needed.   Labwork: Your physician recommends that you return for lab work WITHIN 14 DAYS OF HEART CATHETERIZATION.   Testing/Procedures: Your physician has requested that you have a RIGHT AND LEFT HEART cardiac catheterization. Cardiac catheterization is used to diagnose and/or treat various heart conditions. Doctors may recommend this procedure for a number of different reasons. The  most common reason is to evaluate chest pain. Chest pain can be a symptom of coronary artery disease (CAD), and cardiac catheterization can show whether plaque is narrowing or blocking your heart's arteries. This procedure is also used to evaluate the valves, as well as measure the blood flow and oxygen levels in different parts of your heart. For further information please visit [email or web address]. Please follow instruction sheet, as given.    Follow-Up: You have been referred to PAIN CLINIC FOR EVALUATION.  Your physician recommends that you schedule a follow-up appointment in: 4 WEEKS [facility].   Any Other Special Instructions Will Be Listed Below (If Applicable).   Coronary Angiogram A coronary angiogram, also called coronary angiography, is an X-ray procedure used to look at the arteries  in the heart. In this procedure, a dye (contrast dye) is injected through a long, hollow tube (catheter). The catheter is about the size of a piece of cooked spaghetti and is inserted through your groin, wrist, or arm. The dye is injected into each artery, and X-rays are then taken to show if there is a blockage in the arteries of your heart. LET [facility] CARE PROVIDER KNOW ABOUT:  Any allergies you have, including allergies to shellfish or contrast dye.   All medicines you are taking, including vitamins, herbs, eye drops, creams, and over-the-counter medicines.   Previous problems you or members of your family have had with the use of anesthetics.   Any blood disorders you have.   Previous surgeries you have had.  History of kidney problems or failure.   Other medical conditions you have. RISKS AND COMPLICATIONS  Generally, a coronary angiogram is a safe procedure. However, problems can occur and include:  Allergic reaction to the dye.  Bleeding from the access site or other locations.  Kidney injury, especially in people with impaired kidney function.  Stroke  (rare).  Heart attack (rare). BEFORE THE PROCEDURE   Do not eat or drink anything after midnight the night before the procedure or as directed by your health care provider.   Ask your health care provider about changing or stopping your regular medicines. This is especially important if you are taking diabetes medicines or blood thinners. PROCEDURE  You may be given a medicine to help you relax (sedative) before the procedure. This medicine is given through an intravenous (IV) access tube that is inserted into one of your veins.   The area where the catheter will be inserted will be washed and shaved. This is usually done in the groin but may be done in the fold of your arm (near your elbow) or in the wrist.   A medicine will be given to numb the area where the catheter will be inserted (local anesthetic).   The health care provider will insert the catheter into an artery. The catheter will be guided by using a special type of X-ray (fluoroscopy) of the blood vessel being examined.   A special dye will then be injected into the catheter, and X-rays will be taken. The dye will help to show where any narrowing or blockages are located in the heart arteries.  AFTER THE PROCEDURE   If the procedure is done through the leg, you will be kept in bed lying flat for several hours. You will be instructed to not bend or cross your legs.  The insertion site will be checked frequently.   The pulse in your feet or wrist will be checked frequently.   Additional blood tests, X-rays, and an electrocardiogram may be done.    This information is not intended to replace advice given to you by your health care provider. Make sure you discuss any questions you have with your health care provider.   Document Released: 09/14/2002 Document Revised: 03/31/2014 Document Reviewed: 08/02/2012 Elsevier Interactive Patient Education [employer]    If you need a refill on your cardiac  medications before your next appointment, please call your pharmacy.      Ramond Dial, [place]  01/14/2016 5:30 PM    [facility] [address] [place], [place], [place]  [address] Phone: [phone]; Fax: [phone]

## 2016-01-14 NOTE — Patient Outreach (Signed)
[facility] Care Management  01/14/2016  Alicia Wood 07/04/1970 [id number]  Subjective:  Received voicemail from patient , states returning call and request call back. [facility], states patient in currently in the office for follow up appointment with Alicia Wood and indicated that this RNCM was assisting with pain management clinic appointment.   RNCM advised Alicia Wood of current pain management appointment status, conversations with  patient, patient's pain management goals, clarification of pain etiology, conversations with pain management clinic, and message left for Dr. Antoine PocheHochrein requesting call back.     States she will update Dr. Antoine PocheHochrein, ask to call RNCM if he feels appropriate or has additional questions.  Alicia Wood states patient is scheduled to have right and left heart cath on 01/21/16 which may assist with chest pain etiology.  RNCM advised Alicia Wood to ask patient to give RNCM a call when availabe or RNCM will call patient on 01/16/16.   Patient will continue to receive transition of care follow up.    Objective: Per chart review: Patient hospitalized 12/17/15 - 12/21/15 for pericarditis and pericardial effusion. Patient hospitalized 12/05/15 - 12/10/15 for chronic diastolic congestive heart failure, pulmonary hypertension, and pericardial effusion. Patient hospitalized 11/22/15 - 11/29/15 for chest pain and pericardial effusion. ED visit on 04/24/15 for leg pain. ED visit on 08/20/15 for chest pain. ED visit on 11/11/15 for chest pain. Patient also has a history of hyperlipidemia.    Assessment: Received [facility] Transition of care referral on 12/10/15. Transition of care follow up ongoing and telephone screen completed. Pain management appointment scheduling pending, MD review.   Plan:RNCM will call patient for 2nd telephone outreach attempt, transition of care follow up, within 10 business days if no return call. RNCM will call  patient to verify outcome of 01/14/16 cardiologistvisit, 01/15/16 gastroenterologist visit, and complete initial assessment, within 3 business weeks.    Ethlyn Alto H. Gardiner Barefootooper RN, BSN, CCM [facility] Care Management [facility] Telephonic CM Phone: [phone] Fax: [phone]

## 2016-01-15 DIAGNOSIS — R10816 Epigastric abdominal tenderness: Secondary | ICD-10-CM | POA: Diagnosis not present

## 2016-01-15 DIAGNOSIS — R1310 Dysphagia, unspecified: Secondary | ICD-10-CM | POA: Diagnosis not present

## 2016-01-15 DIAGNOSIS — R194 Change in bowel habit: Secondary | ICD-10-CM | POA: Diagnosis not present

## 2016-01-15 DIAGNOSIS — K219 Gastro-esophageal reflux disease without esophagitis: Secondary | ICD-10-CM | POA: Diagnosis not present

## 2016-01-15 LAB — CBC WITH DIFFERENTIAL/PLATELET
Basophils Absolute: 0 cells/uL (ref 0–200)
Basophils Relative: 0 %
Eosinophils Absolute: 0 cells/uL — ABNORMAL LOW (ref 15–500)
Eosinophils Relative: 0 %
HCT: 33.3 % — ABNORMAL LOW (ref 35.0–45.0)
Hemoglobin: 10.6 g/dL — ABNORMAL LOW (ref 11.7–15.5)
Lymphocytes Relative: 17 %
Lymphs Abs: 1666 cells/uL (ref 850–3900)
MCH: 27.6 pg (ref 27.0–33.0)
MCHC: 31.8 g/dL — ABNORMAL LOW (ref 32.0–36.0)
MCV: 86.7 fL (ref 80.0–100.0)
MPV: 9 fL (ref 7.5–12.5)
Monocytes Absolute: 294 cells/uL (ref 200–950)
Monocytes Relative: 3 %
Neutro Abs: 7840 cells/uL — ABNORMAL HIGH (ref 1500–7800)
Neutrophils Relative %: 80 %
Platelets: 432 10*3/uL — ABNORMAL HIGH (ref 140–400)
RBC: 3.84 MIL/uL (ref 3.80–5.10)
RDW: 15.5 % — ABNORMAL HIGH (ref 11.0–15.0)
WBC: 9.8 10*3/uL (ref 3.8–10.8)

## 2016-01-15 LAB — BASIC METABOLIC PANEL
BUN: 15 mg/dL (ref 7–25)
CO2: 29 mmol/L (ref 20–31)
Calcium: 8.9 mg/dL (ref 8.6–10.2)
Chloride: 100 mmol/L (ref 98–110)
Creat: 0.8 mg/dL (ref 0.50–1.10)
Glucose, Bld: 88 mg/dL (ref 65–99)
Potassium: 4 mmol/L (ref 3.5–5.3)
Sodium: 141 mmol/L (ref 135–146)

## 2016-01-15 LAB — PROTIME-INR
INR: 1
Prothrombin Time: 10.3 s (ref 9.0–11.5)

## 2016-01-16 ENCOUNTER — Encounter: Payer: Self-pay | Admitting: *Deleted

## 2016-01-16 ENCOUNTER — Other Ambulatory Visit: Payer: Self-pay | Admitting: *Deleted

## 2016-01-16 ENCOUNTER — Ambulatory Visit: Payer: Self-pay | Admitting: *Deleted

## 2016-01-16 NOTE — Patient Outreach (Addendum)
[facility]) Care Management  [facility]  01/16/2016   Alicia Wood 10/24/1970 [id number]  Subjective: Telephone call to patient's home number times 2, spoke with patient, and HIPAA verified.   States her chest pain continues, hurting badly, and even hurts to swallow.  Took Tramadol earlier this morning and the pain has not changed.  States she is trying to work through the pain at this time.  RNCM advised patient to call MD's office to report symptoms and patient voices understanding.  States in the past she has always called cardiologist office to report chest pain and they advise her to go to the ED visit for evaluation.  States she does not want to be admitted to the hospital or go to ED at this time, unless the pain becomes unbearable.  States she is aware of signs and symptoms to report to MD and will follow up as appropriate.  States follow up appointments with MDs  went ok and MDs are moving in the right direction to determine the cause of her pain.  States she has a cardiac cath scheduled for 01/21/16, is hoping the procedure will give her some answers, and determine the severity of the pericarditis.  States she is depressed due the unknowns of her condition and does not want to keep taking pain medications.  States she is not taking Tramadol everyday and only takes it when the pain is unbearable.   States she also has a prescription for Percocet, but has not filled it because she does want to take it.   Is depressed and wants her life back.  Does not want to take medications for depression at this time or to follow up [employer](Employee Assistance Counseling Program). Patient states weighs periodically and reports weights to primary MD.   [facility] educated patient of the importance of weighing daily with congestive heart failure and reporting changes in weight to cardiologist.  Patient voices understanding and states she will monitor and report to cardiologist.  Patient is  aware that prednisone can cause weight changes.   States her weight range over the last couple of months has been 297 - 263 pounds and she has been working on weight reduction.  States she is planning to follow up with [facility] for a weight loss program in the near future and is aware of the free benefit for [facility] [employer].  RNCM advised patient of conversation with [name] at Dr. Jenene SlickerHochrein's office and patient states she was with [name] during this conversation.  States she has not received a call from the pain management clinic regarding scheduling appointment.  States she is aware that Dr. Antoine PocheHochrein may also follow up with pain management MD to facilitate sooner appointment.  Patient will continue to receive ongoing care coordination for pain management.   Objective: Per chart review: Patient hospitalized 12/17/15 - 12/21/15 for pericarditis and pericardial effusion. Patient hospitalized 12/05/15 - 12/10/15 for chronic diastolic congestive heart failure, pulmonary hypertension, and pericardial effusion. Patient hospitalized 11/22/15 - 11/29/15 for chest pain and pericardial effusion. ED visit on 04/24/15 for leg pain. ED visit on 08/20/15 for chest pain. ED visit on 11/11/15 for chest pain.  ED visit on 01/06/16 for chest pain.  Patient also has a history of hyperlipidemia.  Encounter Medications:  Outpatient Encounter Prescriptions as of 01/16/2016  Medication Sig  . aspirin EC 81 MG tablet Take 1 tablet (81 mg total) by mouth daily.  . colchicine 0.6 MG tablet Take  1 tablet (0.6 mg total) by mouth 2 (two) times daily.  . furosemide (LASIX) 20 MG tablet Take 1 tablet (20 mg total) by mouth daily. Daily as needed.  . pantoprazole (PROTONIX) 40 MG tablet Take 1 tablet (40 mg total) by mouth daily.  . polyethylene glycol powder (MIRALAX) powder Take 17 g by mouth daily.  . predniSONE (DELTASONE) 10 MG tablet Take 80mg  daily x 7 days, 70mg  daily x 7 days, 60mg  x 7  days, 50mg  x 7 days, 40mg  x 7 days, 30mg  x 7 days, 20mg  x 7 days, 10mg  x 7 days.  Marland Kitchen senna-docusate (SENOKOT-S) 8.6-50 MG tablet Take 1 tablet by mouth 2 (two) times daily.  . traMADol (ULTRAM) 50 MG tablet Take 50 mg by mouth 3 (three) times daily as needed for moderate pain.   No facility-administered encounter medications on file as of 01/16/2016.     Functional Status:  In your present state of health, do you have any difficulty performing the following activities: 01/16/2016 12/18/2015  Hearing? N N  Vision? N N  Difficulty concentrating or making decisions? N N  Walking or climbing stairs? N N  Dressing or bathing? N N  Doing errands, shopping? N N  Preparing Food and eating ? N -  Using the Toilet? N -  In the past six months, have you accidently leaked urine? N -  Do you have problems with loss of bowel control? N -  Managing your Medications? N -  Managing your Finances? N -  Housekeeping or managing your Housekeeping? N -  Some recent data might be hidden    Fall/Depression Screening: PHQ 2/9 Scores 01/16/2016 12/25/2015  PHQ - 2 Score 4 4  PHQ- 9 Score 13 13    Assessment: Received [facility] Transition of care referral on 12/10/15. Transition of care follow up ongoing and telephone screen completed.  Pain management appointment scheduling pending, MD review.  Patient will continue to receive ongoing care coordination for pain management.  Plan: RNCM will call patient for  telephone outreach attempt, care coordination follow up, within 10 business days if no return call. RNCM will follow up with pain management clinic, regarding pain clinic appointment status, within 10 business days, if no return call.  RNCM will call patient to verify outcome of 01/21/16 cardiac cath, 01/30/16/17 EGD procedure, within 3 business weeks.  Yennifer Segovia H. Gardiner Barefoot, BSN, CCM [facility] Care Management [facility] Telephonic CM Phone: [phone] Fax: [phone]

## 2016-01-17 ENCOUNTER — Other Ambulatory Visit: Payer: Self-pay | Admitting: *Deleted

## 2016-01-17 NOTE — Patient Outreach (Addendum)
[facility]) Care Management  01/17/2016  Alicia Wood 04/14/1970 [id number]   Subjective: Case discussed during difficult case discussion this am with Dr. Ronaldo Miyamotoobert Reade Jr.  Objective: Per chart review: Patient hospitalized 12/17/15 - 12/21/15 for pericarditis and pericardial effusion. Patient hospitalized 12/05/15 - 12/10/15 for chronic diastolic congestive heart failure, pulmonary hypertension, and pericardial effusion. Patient hospitalized 11/22/15 - 11/29/15 for chest pain and pericardial effusion. ED visit on 04/24/15 for leg pain. ED visit on 08/20/15 for chest pain. ED visit on 11/11/15 for chest pain.  ED visit on 01/06/16 for chest pain.  Patient also has a history of hyperlipidemia.   Assessment: Received [facility] Transition of care referral on 12/10/15. Transition of care follow up ongoing and telephone screen completed. Pain management appointment scheduling pending, MD review.  Patient will continue to receive ongoing care coordination for pain management.  Plan: RNCM will call patient for  telephone outreach, care coordination follow up, within 10 business days if no return call. RNCM will follow up with pain management clinic, regarding pain clinic appointment status, within 10 business days, if no return call.  RNCM will call patient to verify outcome of 01/21/16 cardiac cath, 01/30/16/17 EGD procedure, within 3 business weeks. RNCM will discuss case update in difficult case discussion within 4 business weeks.    Alicia Converse H. Gardiner Barefootooper RN, BSN, CCM [facility] Care [facility] Telephonic CM Phone: [phone] Fax: [phone]

## 2016-01-21 ENCOUNTER — Encounter (HOSPITAL_COMMUNITY)
Admission: RE | Disposition: A | Discharge status: Home / Self Care | Payer: Self-pay | Source: Ambulatory Visit | Attending: Cardiovascular Disease

## 2016-01-21 ENCOUNTER — Encounter (HOSPITAL_COMMUNITY): Payer: Self-pay | Admitting: Cardiovascular Disease

## 2016-01-21 ENCOUNTER — Ambulatory Visit (HOSPITAL_COMMUNITY)
Admission: RE | Admit: 2016-01-21 | Discharge: 2016-01-21 | Disposition: A | Discharge status: Home / Self Care | Payer: 59 | Source: Ambulatory Visit | Attending: Cardiovascular Disease | Admitting: Cardiovascular Disease

## 2016-01-21 DIAGNOSIS — D649 Anemia, unspecified: Secondary | ICD-10-CM | POA: Diagnosis not present

## 2016-01-21 DIAGNOSIS — R072 Precordial pain: Secondary | ICD-10-CM

## 2016-01-21 DIAGNOSIS — Z79899 Other long term (current) drug therapy: Secondary | ICD-10-CM | POA: Diagnosis not present

## 2016-01-21 DIAGNOSIS — I110 Hypertensive heart disease with heart failure: Secondary | ICD-10-CM | POA: Diagnosis not present

## 2016-01-21 DIAGNOSIS — Z7982 Long term (current) use of aspirin: Secondary | ICD-10-CM | POA: Diagnosis not present

## 2016-01-21 DIAGNOSIS — I200 Unstable angina: Secondary | ICD-10-CM | POA: Insufficient documentation

## 2016-01-21 DIAGNOSIS — Z8249 Family history of ischemic heart disease and other diseases of the circulatory system: Secondary | ICD-10-CM | POA: Diagnosis not present

## 2016-01-21 DIAGNOSIS — E785 Hyperlipidemia, unspecified: Secondary | ICD-10-CM | POA: Insufficient documentation

## 2016-01-21 DIAGNOSIS — Z7952 Long term (current) use of systemic steroids: Secondary | ICD-10-CM | POA: Insufficient documentation

## 2016-01-21 DIAGNOSIS — Z90710 Acquired absence of both cervix and uterus: Secondary | ICD-10-CM | POA: Diagnosis not present

## 2016-01-21 DIAGNOSIS — Z79891 Long term (current) use of opiate analgesic: Secondary | ICD-10-CM | POA: Insufficient documentation

## 2016-01-21 DIAGNOSIS — I509 Heart failure, unspecified: Secondary | ICD-10-CM | POA: Diagnosis not present

## 2016-01-21 DIAGNOSIS — Z9049 Acquired absence of other specified parts of digestive tract: Secondary | ICD-10-CM | POA: Insufficient documentation

## 2016-01-21 HISTORY — PX: CARDIAC CATHETERIZATION: SHX172

## 2016-01-21 LAB — POCT I-STAT 3, ART BLOOD GAS (G3+)
Acid-Base Excess: 6 mmol/L — ABNORMAL HIGH (ref 0.0–2.0)
Bicarbonate: 31.9 mmol/L — ABNORMAL HIGH (ref 20.0–28.0)
O2 Saturation: 94 %
TCO2: 33 mmol/L (ref 0–100)
pCO2 arterial: 54.7 mmHg — ABNORMAL HIGH (ref 32.0–48.0)
pH, Arterial: 7.373 (ref 7.350–7.450)
pO2, Arterial: 76 mmHg — ABNORMAL LOW (ref 83.0–108.0)

## 2016-01-21 LAB — POCT I-STAT 3, VENOUS BLOOD GAS (G3P V)
Acid-Base Excess: 5 mmol/L — ABNORMAL HIGH (ref 0.0–2.0)
Bicarbonate: 30.7 mmol/L — ABNORMAL HIGH (ref 20.0–28.0)
O2 Saturation: 74 %
TCO2: 32 mmol/L (ref 0–100)
pCO2, Ven: 49.3 mmHg (ref 44.0–60.0)
pH, Ven: 7.402 (ref 7.250–7.430)
pO2, Ven: 40 mmHg (ref 32.0–45.0)

## 2016-01-21 LAB — GLUCOSE, CAPILLARY: Glucose-Capillary: 92 mg/dL (ref 65–99)

## 2016-01-21 SURGERY — RIGHT/LEFT HEART CATH AND CORONARY ANGIOGRAPHY

## 2016-01-21 MED ORDER — MIDAZOLAM HCL 2 MG/2ML IJ SOLN
INTRAMUSCULAR | Status: AC
Start: 2016-01-21 — End: 2016-01-21
  Filled 2016-01-21: qty 2

## 2016-01-21 MED ORDER — HEPARIN (PORCINE) IN NACL 2-0.9 UNIT/ML-% IJ SOLN
INTRAMUSCULAR | Status: DC | PRN
  Administered 2016-01-21: 1000 mL via INTRA_ARTERIAL

## 2016-01-21 MED ORDER — LIDOCAINE HCL (PF) 1 % IJ SOLN
INTRAMUSCULAR | Status: DC | PRN
  Administered 2016-01-21 (×2): 2 mL via INTRADERMAL

## 2016-01-21 MED ORDER — HEPARIN SODIUM (PORCINE) 1000 UNIT/ML IJ SOLN
INTRAMUSCULAR | Status: AC
  Filled 2016-01-21: qty 1

## 2016-01-21 MED ORDER — SODIUM CHLORIDE 0.9 % IV SOLN: 250.0000 mL | INTRAVENOUS | Status: DC | PRN

## 2016-01-21 MED ORDER — FENTANYL CITRATE (PF) 100 MCG/2ML IJ SOLN
INTRAMUSCULAR | Status: DC | PRN
  Administered 2016-01-21: 50 ug via INTRAVENOUS
  Administered 2016-01-21: 25 ug via INTRAVENOUS

## 2016-01-21 MED ORDER — SODIUM CHLORIDE 0.9% FLUSH: 3.0000 mL | Freq: Two times a day (BID) | INTRAVENOUS | Status: DC

## 2016-01-21 MED ORDER — MIDAZOLAM HCL 2 MG/2ML IJ SOLN
INTRAMUSCULAR | Status: DC | PRN
  Administered 2016-01-21: 2 mg via INTRAVENOUS
  Administered 2016-01-21: 1 mg via INTRAVENOUS

## 2016-01-21 MED ORDER — SODIUM CHLORIDE 0.9% FLUSH: 3.0000 mL | INTRAVENOUS | Status: DC | PRN

## 2016-01-21 MED ORDER — VERAPAMIL HCL 2.5 MG/ML IV SOLN
INTRAVENOUS | Status: AC
  Filled 2016-01-21: qty 2

## 2016-01-21 MED ORDER — ASPIRIN 81 MG PO CHEW
CHEWABLE_TABLET | ORAL | Status: AC
  Administered 2016-01-21: 81 mg
  Filled 2016-01-21: qty 1

## 2016-01-21 MED ORDER — NITROGLYCERIN 0.4 MG SL SUBL
SUBLINGUAL_TABLET | SUBLINGUAL | Status: AC
  Filled 2016-01-21: qty 1

## 2016-01-21 MED ORDER — DIAZEPAM 5 MG PO TABS
5.0000 mg | ORAL_TABLET | Freq: Once | ORAL | Status: AC
  Administered 2016-01-21: 5 mg via ORAL

## 2016-01-21 MED ORDER — TRAMADOL HCL 50 MG PO TABS
50.0000 mg | ORAL_TABLET | Freq: Once | ORAL | Status: AC
  Administered 2016-01-21: 50 mg via ORAL

## 2016-01-21 MED ORDER — HEPARIN SODIUM (PORCINE) 1000 UNIT/ML IJ SOLN
INTRAMUSCULAR | Status: DC | PRN
  Administered 2016-01-21: 6000 [IU] via INTRAVENOUS

## 2016-01-21 MED ORDER — MIDAZOLAM HCL 2 MG/2ML IJ SOLN
INTRAMUSCULAR | Status: AC
  Filled 2016-01-21: qty 2

## 2016-01-21 MED ORDER — FENTANYL CITRATE (PF) 100 MCG/2ML IJ SOLN
INTRAMUSCULAR | Status: AC
  Filled 2016-01-21: qty 2

## 2016-01-21 MED ORDER — SODIUM CHLORIDE 0.9 % IV SOLN: INTRAVENOUS | Status: DC

## 2016-01-21 MED ORDER — SODIUM CHLORIDE 0.9 % WEIGHT BASED INFUSION
3.0000 mL/kg/h | INTRAVENOUS | Status: AC
  Administered 2016-01-21: 3 mL/kg/h via INTRAVENOUS

## 2016-01-21 MED ORDER — ASPIRIN 81 MG PO CHEW: 81.0000 mg | CHEWABLE_TABLET | ORAL | Status: AC

## 2016-01-21 MED ORDER — SODIUM CHLORIDE 0.9 % IV SOLN: INTRAVENOUS | Status: AC

## 2016-01-21 MED ORDER — DIAZEPAM 5 MG PO TABS
ORAL_TABLET | ORAL | Status: AC
  Filled 2016-01-21: qty 1

## 2016-01-21 MED ORDER — IOPAMIDOL (ISOVUE-370) INJECTION 76%
INTRAVENOUS | Status: DC | PRN
  Administered 2016-01-21: 50 mL via INTRA_ARTERIAL

## 2016-01-21 MED ORDER — TRAMADOL HCL 50 MG PO TABS
ORAL_TABLET | ORAL | Status: AC
  Filled 2016-01-21: qty 1

## 2016-01-21 MED ORDER — NITROGLYCERIN 0.4 MG SL SUBL
0.4000 mg | SUBLINGUAL_TABLET | SUBLINGUAL | Status: DC | PRN
  Administered 2016-01-21 (×2): 0.4 mg via SUBLINGUAL

## 2016-01-21 MED ORDER — VERAPAMIL HCL 2.5 MG/ML IV SOLN
INTRAVENOUS | Status: DC | PRN
  Administered 2016-01-21: 08:00:00 via INTRA_ARTERIAL

## 2016-01-21 MED ORDER — SODIUM CHLORIDE 0.9 % WEIGHT BASED INFUSION: 1.0000 mL/kg/h | INTRAVENOUS | Status: DC

## 2016-01-21 MED ORDER — HEPARIN (PORCINE) IN NACL 2-0.9 UNIT/ML-% IJ SOLN
INTRAMUSCULAR | Status: AC
  Filled 2016-01-21: qty 1000

## 2016-01-21 MED ORDER — LIDOCAINE HCL (PF) 1 % IJ SOLN
INTRAMUSCULAR | Status: AC
  Filled 2016-01-21: qty 30

## 2016-01-21 MED ORDER — IOPAMIDOL (ISOVUE-370) INJECTION 76%
INTRAVENOUS | Status: AC
  Filled 2016-01-21: qty 100

## 2016-01-21 SURGICAL SUPPLY — 14 items
CATH BALLN WEDGE 5F 110CM (CATHETERS) ×2 IMPLANT
CATH INFINITI 5 FR JL3.5 (CATHETERS) ×2 IMPLANT
CATH INFINITI 5FR ANG PIGTAIL (CATHETERS) ×2 IMPLANT
CATH INFINITI JR4 5F (CATHETERS) ×2 IMPLANT
DEVICE RAD COMP TR BAND LRG (VASCULAR PRODUCTS) ×2 IMPLANT
GLIDESHEATH SLEND SS 6F .021 (SHEATH) ×2 IMPLANT
KIT HEART LEFT (KITS) ×2 IMPLANT
PACK CARDIAC CATHETERIZATION (CUSTOM PROCEDURE TRAY) ×2 IMPLANT
SHEATH FAST CATH BRACH 5F 5CM (SHEATH) ×2 IMPLANT
TRANSDUCER W/STOPCOCK (MISCELLANEOUS) ×4 IMPLANT
TUBING ART PRESS 72  MALE/FEM (TUBING) ×1
TUBING ART PRESS 72 MALE/FEM (TUBING) ×1 IMPLANT
TUBING CIL FLEX 10 FLL-RA (TUBING) ×2 IMPLANT
WIRE SAFE-T 1.5MM-J .035X260CM (WIRE) ×2 IMPLANT

## 2016-01-21 NOTE — Discharge Instructions (Signed)
Radial Site Care °Refer to this sheet in the next few weeks. These instructions provide you with information about caring for yourself after your procedure. Your health care provider may also give you more specific instructions. Your treatment has been planned according to current medical practices, but problems sometimes occur. Call your health care provider if you have any problems or questions after your procedure. °WHAT TO EXPECT AFTER THE PROCEDURE °After your procedure, it is typical to have the following: °· Bruising at the radial site that usually fades within 1-2 weeks. °· Blood collecting in the tissue (hematoma) that may be painful to the touch. It should usually decrease in size and tenderness within 1-2 weeks. °HOME CARE INSTRUCTIONS °· Take medicines only as directed by your health care provider. °· You may shower 24-48 hours after the procedure or as directed by your health care provider. Remove the bandage (dressing) and gently wash the site with plain soap and water. Pat the area dry with a clean towel. Do not rub the site, because this may cause bleeding. °· Do not take baths, swim, or use a hot tub until your health care provider approves. °· Check your insertion site every day for redness, swelling, or drainage. °· Do not apply powder or lotion to the site. °· Do not flex or bend the affected arm for 24 hours or as directed by your health care provider. °· Do not push or pull heavy objects with the affected arm for 24 hours or as directed by your health care provider. °· Do not lift over 10 lb (4.5 kg) for 5 days after your procedure or as directed by your health care provider. °· Ask your health care provider when it is okay to: °¨ Return to work or school. °¨ Resume usual physical activities or sports. °¨ Resume sexual activity. °· Do not drive home if you are discharged the same day as the procedure. Have someone else drive you. °· You may drive 24 hours after the procedure unless otherwise  instructed by your health care provider. °· Do not operate machinery or power tools for 24 hours after the procedure. °· If your procedure was done as an outpatient procedure, which means that you went home the same day as your procedure, a responsible adult should be with you for the first 24 hours after you arrive home. °· Keep all follow-up visits as directed by your health care provider. This is important. °SEEK MEDICAL CARE IF: °· You have a fever. °· You have chills. °· You have increased bleeding from the radial site. Hold pressure on the site. °SEEK IMMEDIATE MEDICAL CARE IF: °· You have unusual pain at the radial site. °· You have redness, warmth, or swelling at the radial site. °· You have drainage (other than a small amount of blood on the dressing) from the radial site. °· The radial site is bleeding, and the bleeding does not stop after 30 minutes of holding steady pressure on the site. °· Your arm or hand becomes pale, cool, tingly, or numb. °  °This information is not intended to replace advice given to you by your health care provider. Make sure you discuss any questions you have with your health care provider. °  °Document Released: 04/12/2010 Document Revised: 03/31/2014 Document Reviewed: 09/26/2013 °[employer] Interactive Patient Education ©2016 [employer] ° °

## 2016-01-21 NOTE — H&P (View-Only) (Signed)
Cardiology Office Note    Date:  01/14/2016   ID:  Alicia Wood, DOB 01/11/1971, MRN [id number]  PCP:  Alicia Wood,DIBAS, MD  Cardiologist: Alicia Wood   Chief Complaint  Patient presents with  . Hospitalization Follow-up    seen for Alicia Wood    History of Present Illness:  Alicia Wood is a 45 y.o. female with PMH of HTN, HLD, and pericardial effusion. She was originally admitted from 8/31-9/7 when she presented complaining of progressive shortness of breath and chest discomfort and found to have moderate to large pericardial effusion on CT scan. Her TSH was normal. CBC showed some anemia but no active bleeding. Guaiac stool was negative. She was placed on colchicine and a set up for possible tap the effusion if she did not improve with medical therapy. Follow-up echocardiogram showed moderate to large circumferential pericardial effusion but no tamponade, her colchicine was increased and ibuprofen was added. She was placed on steroid and PPI for brief period of time. She had elevated CRP and sedimentation rate consistent with pericarditis. Steroid was ultimately stopped and the naproxen was added to her medical regimen. She had a repeat echocardiogram on 9/5 that showed decreasing size of effusion and no temponade.   She was seen in the hospital again on 12/05/2015 after developing recurrent chest discomfort. He was given 2 sublingual nitroglycerin by EMS and was noted to be hypotensive. Blood pressure was in the 70s systolic on arrival. She was treated with IV fluid. No sinus rhythm without acute ST-T wave changes. Patient was admitted to hospitalist service, cardiology was consulted on 12/05/2015. Echocardiogram was repeated in the ED on the same day which showed EF 55-60%, grade 2 diastolic dysfunction, moderately reduced RV EF, PA peak pressure 37 mmHg, small circumferential pericardial effusion was noted without hemodynamic compromise. She underwent cardiac MRI which showed normal  EF 57%, RVEF 55%, mild amount of pericardial effusion predominantly in the proximity of the right ventricle, and the pericardial focally up to 8 mm, and a significant circumferential late gadolinium enhancement of the pericardium and epicardium. 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 She was treated with IV Solu-Medrol in the hospital, and he was eventually discharged on oral prednisone with plan for repeat CMR or right heart cath in 4-6 weeks. She has had extensive workup including negative HIV, Quantiferon TB, ASA or RF. She was discharged on steroid taper.  I last saw her on 12/14/2015, she was still having some chest discomfort that time. She also has some increased edema which I think was related to steroid. The origin of this tooth reassess her symptom in 3-4 weeks before deciding whether she will require another hardy mri versus right heart cath. unfortunately since i saw her, she has been seen in the hospital for at least 3 times. CRP was elevated at 17.2. Lactic acid and troponin were normal. Sedimentation rate 39. During the hospitalization, she also developed trouble swallowing pills and food, given the prior use of steroid, the concern was she may have developed esophageal candidiasis. She was prophylactically treated with a course of Diflucan. Echocardiogram was repeated on 12/19/2015 which showed ejection fraction 55-60%, no regional wall motion abnormality, trivial pericardial effusion without sign of temperature normal. She presented to the emergency room on 10/13, however left before she could be evaluated by Alicia Wood. He returned to the ED on 01/06/2016 with complaint of chest pain again. D-dimer was elevated at 0.8. CT angiogram of the  chest demonstrated no PE or other lung pathology. Sedimentation rate has normalized.  Patient is frustrated today, she does not understand why she keeps having chest pain. It has been going on  for several weeks now. She says she is unable to lay flat at night. We did note that her sedimentation rate has returned back to normal. She has went back to the emergency room multiple times. I have discussed with Dr. [name], we will consider left and right heart cath. Of note, there is still the question of possible constrictive pericarditis. We will see what the right heart cath shows in this case.    Past Medical History:  Diagnosis Date  . CHF (congestive heart failure) (HCC)   . Hyperlipidemia   . Hypertension   . Pericardial effusion     Past Surgical History:  Procedure Laterality Date  . ABDOMINAL HYSTERECTOMY    . APPENDECTOMY    . PARTIAL HYSTERECTOMY    . TUBAL LIGATION      Current Medications: Outpatient Medications Prior to Visit  Medication Sig Dispense Refill  . aspirin EC 81 MG tablet Take 1 tablet (81 mg total) by mouth daily.    . colchicine 0.6 MG tablet Take 1 tablet (0.6 mg total) by mouth 2 (two) times daily. 60 tablet 1  . pantoprazole (PROTONIX) 40 MG tablet Take 1 tablet (40 mg total) by mouth daily. 30 tablet 0  . polyethylene glycol powder (MIRALAX) powder Take 17 g by mouth daily. 255 g 0  . predniSONE (DELTASONE) 10 MG tablet Take 80mg  daily x 7 days, 70mg  daily x 7 days, 60mg  x 7 days, 50mg  x 7 days, 40mg  x 7 days, 30mg  x 7 days, 20mg  x 7 days, 10mg  x 7 days. 252 tablet 0  . senna-docusate (SENOKOT-S) 8.6-50 MG tablet Take 1 tablet by mouth 2 (two) times daily. 30 tablet 0  . traMADol (ULTRAM) 50 MG tablet Take 50 mg by mouth 3 (three) times daily as needed for moderate pain.    . fluconazole (DIFLUCAN) 200 MG tablet Take 1 tablet (200 mg total) by mouth daily. (Patient not taking: Reported on 01/06/2016) 14 tablet 0  . HYDROcodone-acetaminophen (NORCO/VICODIN) 5-325 MG tablet Take 1 tablet by mouth every 6 (six) hours as needed for moderate pain.    Marland Kitchen ibuprofen (ADVIL,MOTRIN) 600 MG tablet Take 1 tablet (600 mg total) by mouth 3 (three) times daily.  (Patient not taking: Reported on 01/06/2016) 30 tablet 0  . oxyCODONE-acetaminophen (PERCOCET/ROXICET) 5-325 MG tablet Take 1 tablet by mouth every 4 (four) hours as needed for severe pain. 15 tablet 0   No facility-administered medications prior to visit.      Allergies:   Review of patient's allergies indicates no known allergies.   Social History   Social History  . Marital status: Legally Separated    Spouse name: N/A  . Number of children: N/A  . Years of education: N/A   Social History Main Topics  . Smoking status: Never Smoker  . Smokeless tobacco: Never Used  . Alcohol use Yes     Comment: occasional  . Drug use: No  . Sexual activity: Not Asked   Other Topics Concern  . None   Social History Narrative  . None     Family History:  The patient's family history includes Hypertension in her father.   ROS:   Please see the history of present illness.    ROS All other systems reviewed and are negative.   PHYSICAL  EXAM:   VS:  BP 127/85   Pulse 77   Ht 5\' 8"  (1.727 m)   Wt 287 lb (130.2 kg)   SpO2 99%   BMI 43.64 kg/m    GEN: Well nourished, well developed, in no acute distress  HEENT: normal  Neck: no JVD, carotid bruits, or masses Cardiac: RRR; no murmurs, rubs, or gallops,no edema  Respiratory:  clear to auscultation bilaterally, normal work of breathing GI: soft, nontender, nondistended, + BS MS: no deformity or atrophy  Skin: warm and dry, no rash Neuro:  Alert and Oriented x 3, Strength and sensation are intact Psych: euthymic mood, full affect  Wt Readings from Last 3 Encounters:  01/14/16 287 lb (130.2 kg)  01/04/16 268 lb (121.6 kg)  12/21/15 268 lb (121.6 kg)      Studies/Labs Reviewed:   EKG:  EKG is not ordered today.    Recent Labs: 11/22/2015: TSH 0.273 12/07/2015: Magnesium 2.0 12/08/2015: ALT 17 01/06/2016: BUN 20; Creatinine, Ser 0.91; Hemoglobin 11.7; Platelets 395; Potassium 3.8; Sodium 139   Lipid Panel No results found  for: CHOL, TRIG, HDL, CHOLHDL, VLDL, LDLCALC, LDLDIRECT  Additional studies/ records that were reviewed today include:    Echo 12/05/2015 LV EF: 55% - 60%  ------------------------------------------------------------------- Indications: Pericardial effusion 423.9.  ------------------------------------------------------------------- History: PMH: Shortness of Breath. Congestive heart failure. Risk factors: Hypertension. Dyslipidemia.  ------------------------------------------------------------------- Study Conclusions  - Left ventricle: The cavity size was normal. Systolic function was normal. The estimated ejection fraction was in the range of 55% to 60%. Wall motion was normal; there were no regional wall motion abnormalities. Features are consistent with a pseudonormal left ventricular filling pattern, with concomitant abnormal relaxation and increased filling pressure (grade 2 diastolic dysfunction). - Right ventricle: The cavity size was mildly dilated. Wall thickness was normal. Systolic function was moderately reduced. - Pulmonary arteries: Systolic pressure was mildly increased. PA peak pressure: 37 mm Hg (S). - Pericardium, extracardiac: A small pericardial effusion was identified circumferential to the heart. There was no evidence of hemodynamic compromise.  Impressions:  - When compared to prior, pericardial effusion is unchanged. Cardiac tamponade remains a bedside diagnosis. There is no RV or RA collpase.   Cardiac MRI 12/06/2015 IMPRESSION: 1. Normal left ventricular size, thickness and systolic function (LVEF = 57%) with no regional wall motion abnormalities.  2. Normal right ventricular size, thickness and systolic function (RVEF = 55%) with no regional wall motion abnormalities.  3. Mild left atrial dilatation.  4. Mild mitral and trivial tricuspid regurgitation.  5. There is mild amount of  pericardial effusion predominantly in the proximity to the right ventricle.  6. There is thickening of the pericardium focally up to 8 mm, and significant circumferential late gadolinium enhancement of the pericardium and epicardium. An IVC dilatation with no respiratory variation is seen.  There is also double bounce of the interventricular septum in diastole.  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 A right sided cardiac cath is Recommended.    Echo 12/19/2015 LV EF: 55% -   60%  - Left ventricle: The cavity size was normal. Systolic function was   normal. The estimated ejection fraction was in the range of 55%   to 60%. Wall motion was normal; there were no regional wall   motion abnormalities. The study is not technically sufficient to   allow evaluation of LV diastolic function. - Aortic valve: Trileaflet; normal thickness leaflets. There was no   regurgitation. - Aortic root: The  aortic root was normal in size. - Mitral valve: There was no regurgitation. - Left atrium: The atrium was normal in size. - Right ventricle: Systolic function was normal. - Right atrium: The atrium was normal in size. - Tricuspid valve: There was no regurgitation. - Pulmonary arteries: Systolic pressure was within the normal   range. - Inferior vena cava: The vessel was normal in size. - Pericardium, extracardiac: A trivial pericardial effusion was   identified. Features were not consistent with tamponade   physiology.   CTA of chest 01/06/2016 IMPRESSION: 1. No pulmonary embolus. 2. No thoracic aortic dissection. 3. Stable small-moderate pericardial effusion. This can be further evaluated by a echocardiography if there is clinical concern regarding hemodynamic status. 4. Small bilateral pleural effusions with dependent atelectasis.    ASSESSMENT:    1. Acute pericarditis, unspecified type   2. Pericardial effusion   3.  Essential hypertension   4. Hyperlipidemia, unspecified hyperlipidemia type   5. Preoperative testing      PLAN:  In order of problems listed above:  1. 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 She has had persistent chest discomfort for more than a month now, I have discussed with her primary cardiologist, we plan to proceed with left and right heart cath.  - Risk and benefit of procedure explained to the patient who display clear understanding and agree to proceed. Discussed with patient possible procedural risk include bleeding, vascular injury, renal injury, arrythmia, MI, stroke and loss of limb or life.  - We have also referred the patient to the pain clinic. She also complained of weight gain, especially now she is on a daily dose of steroid. She does have some mild fluid accumulation in the lower extremity, I put her on an as needed dose of Lasix.  2. HTN: Blood pressure well controlled    Medication Adjustments/Labs and Tests Ordered: Current medicines are reviewed at length with the patient today.  Concerns regarding medicines are outlined above.  Medication changes, Labs and Tests ordered today are listed in the Patient Instructions below. Patient Instructions  Medication Instructions:  Your physician has recommended you make the following change in your medication:  1- START TAKING Lasix (furosemide) 20 mg (1 tablet) by mouth once a day as needed.   Labwork: Your physician recommends that you return for lab work WITHIN 14 DAYS OF HEART CATHETERIZATION.   Testing/Procedures: Your physician has requested that you have a RIGHT AND LEFT HEART cardiac catheterization. Cardiac catheterization is used to diagnose and/or treat various heart conditions. Doctors may recommend this procedure for a number of different reasons. The  most common reason is to evaluate chest pain. Chest pain can be a symptom of coronary artery disease (CAD), and cardiac catheterization can show whether plaque is narrowing or blocking your heart's arteries. This procedure is also used to evaluate the valves, as well as measure the blood flow and oxygen levels in different parts of your heart. For further information please visit [email or web address]. Please follow instruction sheet, as given.    Follow-Up: You have been referred to PAIN CLINIC FOR EVALUATION.  Your physician recommends that you schedule a follow-up appointment in: 4 WEEKS [facility].   Any Other Special Instructions Will Be Listed Below (If Applicable).   Coronary Angiogram A coronary angiogram, also called coronary angiography, is an X-ray procedure used to look at the arteries  in the heart. In this procedure, a dye (contrast dye) is injected through a long, hollow tube (catheter). The catheter is about the size of a piece of cooked spaghetti and is inserted through your groin, wrist, or arm. The dye is injected into each artery, and X-rays are then taken to show if there is a blockage in the arteries of your heart. LET [facility] CARE PROVIDER KNOW ABOUT:  Any allergies you have, including allergies to shellfish or contrast dye.   All medicines you are taking, including vitamins, herbs, eye drops, creams, and over-the-counter medicines.   Previous problems you or members of your family have had with the use of anesthetics.   Any blood disorders you have.   Previous surgeries you have had.  History of kidney problems or failure.   Other medical conditions you have. RISKS AND COMPLICATIONS  Generally, a coronary angiogram is a safe procedure. However, problems can occur and include:  Allergic reaction to the dye.  Bleeding from the access site or other locations.  Kidney injury, especially in people with impaired kidney function.  Stroke  (rare).  Heart attack (rare). BEFORE THE PROCEDURE   Do not eat or drink anything after midnight the night before the procedure or as directed by your health care provider.   Ask your health care provider about changing or stopping your regular medicines. This is especially important if you are taking diabetes medicines or blood thinners. PROCEDURE  You may be given a medicine to help you relax (sedative) before the procedure. This medicine is given through an intravenous (IV) access tube that is inserted into one of your veins.   The area where the catheter will be inserted will be washed and shaved. This is usually done in the groin but may be done in the fold of your arm (near your elbow) or in the wrist.   A medicine will be given to numb the area where the catheter will be inserted (local anesthetic).   The health care provider will insert the catheter into an artery. The catheter will be guided by using a special type of X-ray (fluoroscopy) of the blood vessel being examined.   A special dye will then be injected into the catheter, and X-rays will be taken. The dye will help to show where any narrowing or blockages are located in the heart arteries.  AFTER THE PROCEDURE   If the procedure is done through the leg, you will be kept in bed lying flat for several hours. You will be instructed to not bend or cross your legs.  The insertion site will be checked frequently.   The pulse in your feet or wrist will be checked frequently.   Additional blood tests, X-rays, and an electrocardiogram may be done.    This information is not intended to replace advice given to you by your health care provider. Make sure you discuss any questions you have with your health care provider.   Document Released: 09/14/2002 Document Revised: 03/31/2014 Document Reviewed: 08/02/2012 Elsevier Interactive Patient Education [employer]    If you need a refill on your cardiac  medications before your next appointment, please call your pharmacy.      Ramond Dial, [place]  01/14/2016 5:30 PM    [facility] [address] [place], [place], [place]  [address] Phone: [phone]; Fax: [phone]

## 2016-01-21 NOTE — Progress Notes (Signed)
Dr Clifton JamesMcAlhany notified of client with continued 7/10 chest pain and no new orders noted and ok to d/c home

## 2016-01-21 NOTE — Progress Notes (Signed)
When I walked into pts room and began talking with her she started grabbing at chest.  States she was having severe pressure in chest, different from what she has had at home. States was 10/10.  Emergency orders started. O2 at 2 liters, Nitroglycerin, and EKG.  IV started.  After first nitro pt states pain down to 4-5/10.  Another one given which relieved acute pain per pt. VS stable throughout.  Cath lab was notified as well.

## 2016-01-21 NOTE — Research (Signed)
CADLAD Informed Consent   Subject Name: Alicia Wood  Subject met inclusion and exclusion criteria.  The informed consent form, study requirements and expectations were reviewed with the subject and questions and concerns were addressed prior to the signing of the consent form.  The subject verbalized understanding of the trail requirements.  The subject agreed to participate in the CADLAD trial and signed the informed consent.  The informed consent was obtained prior to performance of any protocol-specific procedures for the subject.  A copy of the signed informed consent was given to the subject and a copy was placed in the subject's medical record.  Jimmy Picket 01/21/2016, 0700am

## 2016-01-21 NOTE — Interval H&P Note (Signed)
History and Physical Interval Note:  01/21/2016 7:20 AM  Alicia MannsPamela Wood  has presented today for cardiac cath with the diagnosis of unstable angina.  The various methods of treatment have been discussed with the patient and family. After consideration of risks, benefits and other options for treatment, the patient has consented to  Procedure(s): Right/Left Heart Cath and Coronary Angiography (N/A) as a surgical intervention .  The patient's history has been reviewed, patient examined, no change in status, stable for surgery.  I have reviewed the patient's chart and labs.  Questions were answered to the patient's satisfaction.    Cath Lab Visit (complete for each Cath Lab visit)  Clinical Evaluation Leading to the Procedure:   ACS: No.  Non-ACS:    Anginal Classification: CCS III  Anti-ischemic medical therapy: No Therapy  Non-Invasive Test Results: No non-invasive testing performed  Prior CABG: No previous CABG        Verne Carrowhristopher Aydee Mcnew

## 2016-01-25 ENCOUNTER — Ambulatory Visit: Payer: Self-pay | Admitting: *Deleted

## 2016-01-28 ENCOUNTER — Ambulatory Visit: Payer: Self-pay | Admitting: *Deleted

## 2016-01-28 ENCOUNTER — Other Ambulatory Visit: Payer: Self-pay | Admitting: *Deleted

## 2016-01-28 NOTE — Patient Outreach (Addendum)
[facility]) Care Management  [employer] Care Manager  01/28/2016   Alicia Wood 05/15/1970 [id number]  Subjective: Telephone call to patient's home number times 2, spoke with patient, and HIPAA verified.   States she is doing fine and best she has felt since 11/21/15.   States she is very excited to return to work.  Will have EGD on 01/30/16, looking for more answers,  and is ready to deal with whatever may come.   Still have trouble swallowing, taking pills, and is able to manage pain with current pain medication as needed.   States symptoms may be GI related.   States she received good news from cardiac cath, no blockage, and has been much better since cath was completed.  States she feel like the cath relieved some of the pressure in chest.    Patient states she is not in any pain at this time, has not received follow up or feedback from the pain management clinic, and does not wish to pursue treatment at the clinic.  States she does not feel like she would receive good care at the pain management clinic since the clinic has not responded to outreach from her, RNCMs, or MD (Dr. Antoine PocheHochrein).  Patient states she does not feel depressed anymore and can now handle her situation.   States she has used prayer to deal with her depression and did not want to use medication.   States she realized that medication would not fix her and only prayer has fixed her.  States she has been dealing with her mother's death and this is the first major event in her life that her mother has not been present.  States she has returned to church, has gone out to eat at [employer] time in the last week, and feels much better overall.   Patient states she would like for RNCM to call next week for last care coordination follow up and determine next steps.  States no EMMI educational needs at this time.   Objective: Per chart review:  Patient had outpatient cardiac cath on 01/21/16.   Patient hospitalized 12/17/15 -  12/21/15 for pericarditis and pericardial effusion. Patient hospitalized 12/05/15 - 12/10/15 for chronic diastolic congestive heart failure, pulmonary hypertension, and pericardial effusion. Patient hospitalized 11/22/15 - 11/29/15 for chest pain and pericardial effusion. ED visit on 04/24/15 for leg pain. ED visit on 08/20/15 for chest pain. ED visit on 11/11/15 for chest pain.  ED visit on 01/06/16 for chest pain. Patient also has a history of hyperlipidemia.    Encounter Medications:  Outpatient Encounter Prescriptions as of 01/28/2016  Medication Sig Note  . aspirin EC 81 MG tablet Take 1 tablet (81 mg total) by mouth daily.   . colchicine 0.6 MG tablet Take 1 tablet (0.6 mg total) by mouth 2 (two) times daily.   . furosemide (LASIX) 20 MG tablet Take 1 tablet (20 mg total) by mouth daily. Daily as needed. (Patient taking differently: Take 20 mg by mouth daily as needed for fluid. Daily as needed.)   . pantoprazole (PROTONIX) 40 MG tablet Take 1 tablet (40 mg total) by mouth daily.   . polyethylene glycol powder (MIRALAX) powder Take 17 g by mouth daily. (Patient taking differently: Take 17 g by mouth daily as needed for moderate constipation. )   . predniSONE (DELTASONE) 10 MG tablet Take 80mg  daily x 7 days, 70mg  daily x 7 days, 60mg  x 7 days, 50mg  x 7 days, 40mg  x 7 days, 30mg   x 7 days, 20mg  x 7 days, 10mg  x 7 days. 01/21/2016: On day four  . senna-docusate (SENOKOT-S) 8.6-50 MG tablet Take 1 tablet by mouth 2 (two) times daily. (Patient taking differently: Take 1 tablet by mouth daily as needed. )   . traMADol (ULTRAM) 50 MG tablet Take 50 mg by mouth 3 (three) times daily as needed for moderate pain.    No facility-administered encounter medications on file as of 01/28/2016.     Functional Status:  In your present state of health, do you have any difficulty performing the following activities: 01/21/2016 01/16/2016  Hearing? N N  Vision? N N  Difficulty concentrating or making decisions?  N N  Walking or climbing stairs? N N  Dressing or bathing? 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 - N  Using the Toilet? - N  In the past six months, have you accidently leaked urine? - N  Do you have problems with loss of bowel control? - N  Managing your Medications? - N  Managing your Finances? - N  Housekeeping or managing your Housekeeping? - N  Some recent data might be hidden    Fall/Depression Screening: PHQ 2/9 Scores 01/28/2016 01/16/2016 12/25/2015  PHQ - 2 Score 0 4 4  PHQ- 9 Score - 13 13   Fall Risk  01/28/2016 01/16/2016 12/25/2015  Falls in the past year? Yes Yes Yes  Number falls in past yr: 2 or more 2 or more 2 or more  Injury with Fall? Yes Yes Yes  Risk Factor Category  High Fall Risk High Fall Risk -  Risk for fall due to : History of fall(s) History of fall(s) History of fall(s)  Follow up Education provided Education provided Falls prevention discussed    Assessment: Received [facility] Transition of care referral on 12/10/15. Transition of care follow up ongoing and telephone screen completed. Pain management appointment scheduling pending, MD review.Patient will continue to receive ongoing care coordination for pain management.  Plan: RNCM will call patient for telephone outreach, care coordination follow up, within 10 business days if no return call. RNCM will follow up with pain management clinic,regarding pain clinic appointment status, within 10 business days, if no return call.  RNCM will call patient to verify outcome of  01/30/16/17 EGD procedure, and determine care coordination next steps, within 2 business weeks. RNCM will discuss case update in difficult case discussion within 4 business weeks.    Meleana Commerford H. Gardiner Barefootooper RN, BSN, CCM [facility] Care [facility] Telephonic CM Phone: [phone] Fax: [phone]

## 2016-01-29 ENCOUNTER — Other Ambulatory Visit: Payer: Self-pay | Admitting: Gastroenterology

## 2016-01-29 ENCOUNTER — Encounter (HOSPITAL_COMMUNITY): Payer: Self-pay | Admitting: *Deleted

## 2016-01-29 NOTE — Progress Notes (Signed)
Pt denies any acute/ new cardiopulmonary issues but is under the care of Dr. Antoine PocheHochrein, Cardiology. Pt made aware to stop taking vitamins, fish oil and herbal medications. Do not take any NSAIDs ie: Ibuprofen, Advil, Naproxen, BC and Goody Powder. Pt stated that she does not want to take her medications prior to procedure, she does not take her morning medications that early. Pt verbalized understanding of all pre-op instructions. Rica MastAngela Kabbe , NP, Anesthesia, asked to review pt history; no new orders given.

## 2016-01-30 ENCOUNTER — Ambulatory Visit (HOSPITAL_COMMUNITY): Payer: 59 | Admitting: Emergency Medicine

## 2016-01-30 ENCOUNTER — Encounter (HOSPITAL_COMMUNITY): Payer: Self-pay | Admitting: Certified Registered Nurse Anesthetist

## 2016-01-30 ENCOUNTER — Ambulatory Visit (HOSPITAL_COMMUNITY)
Admission: RE | Admit: 2016-01-30 | Discharge: 2016-01-30 | Disposition: A | Discharge status: Home / Self Care | Payer: 59 | Source: Ambulatory Visit | Attending: Gastroenterology | Admitting: Gastroenterology

## 2016-01-30 ENCOUNTER — Encounter (HOSPITAL_COMMUNITY)
Admission: RE | Disposition: A | Discharge status: Home / Self Care | Payer: Self-pay | Source: Ambulatory Visit | Attending: Gastroenterology

## 2016-01-30 DIAGNOSIS — K2960 Other gastritis without bleeding: Secondary | ICD-10-CM | POA: Diagnosis not present

## 2016-01-30 DIAGNOSIS — R1013 Epigastric pain: Secondary | ICD-10-CM | POA: Diagnosis not present

## 2016-01-30 DIAGNOSIS — K449 Diaphragmatic hernia without obstruction or gangrene: Secondary | ICD-10-CM | POA: Insufficient documentation

## 2016-01-30 DIAGNOSIS — Z806 Family history of leukemia: Secondary | ICD-10-CM | POA: Insufficient documentation

## 2016-01-30 DIAGNOSIS — R109 Unspecified abdominal pain: Secondary | ICD-10-CM | POA: Diagnosis not present

## 2016-01-30 DIAGNOSIS — Z90710 Acquired absence of both cervix and uterus: Secondary | ICD-10-CM | POA: Diagnosis not present

## 2016-01-30 DIAGNOSIS — Z8261 Family history of arthritis: Secondary | ICD-10-CM | POA: Diagnosis not present

## 2016-01-30 DIAGNOSIS — Z7982 Long term (current) use of aspirin: Secondary | ICD-10-CM | POA: Diagnosis not present

## 2016-01-30 DIAGNOSIS — Z79899 Other long term (current) drug therapy: Secondary | ICD-10-CM | POA: Insufficient documentation

## 2016-01-30 DIAGNOSIS — G43909 Migraine, unspecified, not intractable, without status migrainosus: Secondary | ICD-10-CM | POA: Diagnosis not present

## 2016-01-30 DIAGNOSIS — Z808 Family history of malignant neoplasm of other organs or systems: Secondary | ICD-10-CM | POA: Insufficient documentation

## 2016-01-30 DIAGNOSIS — Z6839 Body mass index (BMI) 39.0-39.9, adult: Secondary | ICD-10-CM | POA: Diagnosis not present

## 2016-01-30 DIAGNOSIS — K2900 Acute gastritis without bleeding: Secondary | ICD-10-CM | POA: Insufficient documentation

## 2016-01-30 DIAGNOSIS — B9681 Helicobacter pylori [H. pylori] as the cause of diseases classified elsewhere: Secondary | ICD-10-CM | POA: Insufficient documentation

## 2016-01-30 DIAGNOSIS — K219 Gastro-esophageal reflux disease without esophagitis: Secondary | ICD-10-CM | POA: Diagnosis present

## 2016-01-30 DIAGNOSIS — R1310 Dysphagia, unspecified: Secondary | ICD-10-CM

## 2016-01-30 DIAGNOSIS — R10816 Epigastric abdominal tenderness: Secondary | ICD-10-CM | POA: Diagnosis present

## 2016-01-30 DIAGNOSIS — Z8249 Family history of ischemic heart disease and other diseases of the circulatory system: Secondary | ICD-10-CM | POA: Insufficient documentation

## 2016-01-30 HISTORY — DX: Headache: R51

## 2016-01-30 HISTORY — DX: Gastro-esophageal reflux disease without esophagitis: K21.9

## 2016-01-30 HISTORY — PX: ESOPHAGOGASTRODUODENOSCOPY (EGD) WITH PROPOFOL: SHX5813

## 2016-01-30 HISTORY — DX: Headache, unspecified: R51.9

## 2016-01-30 SURGERY — ESOPHAGOGASTRODUODENOSCOPY (EGD) WITH PROPOFOL
Anesthesia: Monitor Anesthesia Care

## 2016-01-30 MED ORDER — SODIUM CHLORIDE 0.9 % IV SOLN: INTRAVENOUS | Status: DC

## 2016-01-30 MED ORDER — PROPOFOL 500 MG/50ML IV EMUL
INTRAVENOUS | Status: DC | PRN
  Administered 2016-01-30: 75 ug/kg/min via INTRAVENOUS

## 2016-01-30 MED ORDER — BUTAMBEN-TETRACAINE-BENZOCAINE 2-2-14 % EX AERO
INHALATION_SPRAY | CUTANEOUS | Status: DC | PRN
  Administered 2016-01-30: 2 via TOPICAL

## 2016-01-30 MED ORDER — LACTATED RINGERS IV SOLN
INTRAVENOUS | Status: DC
  Administered 2016-01-30 (×2): via INTRAVENOUS

## 2016-01-30 NOTE — Anesthesia Preprocedure Evaluation (Signed)
Anesthesia Evaluation  Patient identified by MRN, date of birth, ID band Patient awake    Reviewed: Allergy & Precautions, NPO status , Patient's Chart, lab work & pertinent test results  History of Anesthesia Complications Negative for: history of anesthetic complications  Airway Mallampati: II  TM Distance: >3 FB     Dental  (+) Teeth Intact   Pulmonary neg pulmonary ROS,    breath sounds clear to auscultation       Cardiovascular hypertension, +CHF   Rhythm:Regular Rate:Normal     Neuro/Psych    GI/Hepatic Neg liver ROS, GERD  ,  Endo/Other  Morbid obesity  Renal/GU negative Renal ROS     Musculoskeletal   Abdominal (+) + obese,   Peds  Hematology   Anesthesia Other Findings   Reproductive/Obstetrics                             Anesthesia Physical Anesthesia Plan  ASA: III  Anesthesia Plan: MAC   Post-op Pain Management:    Induction: Intravenous  Airway Management Planned: Natural Airway  Additional Equipment:   Intra-op Plan:   Post-operative Plan:   Informed Consent: I have reviewed the patients History and Physical, chart, labs and discussed the procedure including the risks, benefits and alternatives for the proposed anesthesia with the patient or authorized representative who has indicated his/her understanding and acceptance.   Dental advisory given  Plan Discussed with:   Anesthesia Plan Comments:         Anesthesia Quick Evaluation

## 2016-01-30 NOTE — Interval H&P Note (Signed)
History and Physical Interval Note:  01/30/2016 10:00 AM  Octavio MannsPamela Williams  has presented today for surgery, with the diagnosis of abd.pain/dysphagia  The various methods of treatment have been discussed with the patient and family. After consideration of risks, benefits and other options for treatment, the patient has consented to  Procedure(s): ESOPHAGOGASTRODUODENOSCOPY (EGD) WITH PROPOFOL (N/A) BALLOON DILATION (N/A) as a surgical intervention .  The patient's history has been reviewed, patient examined, no change in status, stable for surgery.  I have reviewed the patient's chart and labs.  Questions were answered to the patient's satisfaction.     Nakiesha Rumsey C.

## 2016-01-30 NOTE — Anesthesia Postprocedure Evaluation (Signed)
Anesthesia Post Note  Patient: Octavio Mannsamela Williams  Procedure(s) Performed: Procedure(s) (LRB): ESOPHAGOGASTRODUODENOSCOPY (EGD) WITH PROPOFOL (N/A) BALLOON DILATION (N/A)  Patient location during evaluation: Endoscopy Anesthesia Type: MAC Level of consciousness: awake and alert Pain management: pain level controlled Vital Signs Assessment: post-procedure vital signs reviewed and stable Respiratory status: spontaneous breathing, nonlabored ventilation, respiratory function stable and patient connected to nasal cannula oxygen Cardiovascular status: stable and blood pressure returned to baseline Anesthetic complications: no    Last Vitals:  Vitals:   01/30/16 1050 01/30/16 1100  BP: 101/67 (!) 141/89  Pulse: 67 68  Resp: 13 20  Temp: 36.5 C     Last Pain:  Vitals:   01/30/16 1050  TempSrc: Oral                 Dezeray Puccio,JAMES TERRILL

## 2016-01-30 NOTE — Anesthesia Preprocedure Evaluation (Signed)
Anesthesia Evaluation  Patient identified by MRN, date of birth, ID band  Airway        Dental   Pulmonary           Cardiovascular hypertension,      Neuro/Psych    GI/Hepatic   Endo/Other    Renal/GU      Musculoskeletal   Abdominal   Peds  Hematology   Anesthesia Other Findings   Reproductive/Obstetrics                             Anesthesia Physical Anesthesia Plan Anesthesia Quick Evaluation

## 2016-01-30 NOTE — Op Note (Signed)
[facility] Patient Name: Alicia Wood Procedure Date : 01/30/2016 MRN: [id number] Attending MD: Shirley FriarVincent C Zarin Hagmann , MD Date of Birth: 10/16/1970 CSN: [id number] Age: [age] Admit Type: Outpatient Procedure:                Upper GI endoscopy Indications:              Epigastric abdominal pain, Dysphagia, Esophageal                            reflux Providers:                Shirley FriarVincent C. Palmira Stickle, MD, Dwain SarnaPatricia Ford, RN, Arlee Muslimhris                            Chandler Tech., Technician, Kirt Boyshomas Mueller, CRNA Referring MD:              Medicines:                Propofol per Anesthesia, Monitored Anesthesia Care Complications:            No immediate complications. Estimated Blood Loss:     Estimated blood loss was minimal. Procedure:                Pre-Anesthesia Assessment:                           - Prior to the procedure, a History and Physical                            was performed, and patient medications and                            allergies were reviewed. The patient's tolerance of                            previous anesthesia was also reviewed. The risks                            and benefits of the procedure and the sedation                            options and risks were discussed with the patient.                            All questions were answered, and informed consent                            was obtained. Prior Anticoagulants: The patient has                            taken no previous anticoagulant or antiplatelet                            agents. ASA Grade Assessment: III - A patient with  severe systemic disease. After reviewing the risks                            and benefits, the patient was deemed in                            satisfactory condition to undergo the procedure.                           After obtaining informed consent, the endoscope was                            passed under direct vision. Throughout the                             procedure, the patient's blood pressure, pulse, and                            oxygen saturations were monitored continuously. The                            EG-[id number] ([id number]) scope was introduced through the                            mouth, and advanced to the second part of duodenum.                            The upper GI endoscopy was accomplished without                            difficulty. The patient tolerated the procedure                            well. Scope In: Scope Out: Findings:      The examined esophagus was normal. Biopsies were taken with a cold       forceps for histology. Estimated blood loss was minimal.      The Z-line was regular and was found 40 cm from the incisors.      Patchy mild inflammation characterized by congestion (edema), erosions       and erythema was found in the gastric antrum.      A small hiatal hernia was present.      The examined duodenum was normal. Impression:               - Normal esophagus. Biopsied.                           - Z-line regular, 40 cm from the incisors.                           - Acute gastritis.                           - Small hiatal hernia.                           -  Normal examined duodenum. Moderate Sedation:      N/A - MAC procedure Recommendation:           - Await pathology results.                           - Use Protonix (pantoprazole) 40 mg PO daily.                           - Patient has a contact number available for                            emergencies. The signs and symptoms of potential                            delayed complications were discussed with the                            patient. Return to normal activities tomorrow.                            Written discharge instructions were provided to the                            patient.                           - Resume previous diet.                           - Continue present medications. Procedure  Code(s):        --- Professional ---                           [phone], Esophagogastroduodenoscopy, flexible,                            transoral; with biopsy, single or multiple Diagnosis Code(s):        --- Professional ---                           R13.10, Dysphagia, unspecified                           K21.9, Gastro-esophageal reflux disease without                            esophagitis                           K29.00, Acute gastritis without bleeding                           R10.13, Epigastric pain                           K44.9, Diaphragmatic hernia without obstruction or  gangrene CPT copyright 2016 American Medical Association. All rights reserved. The codes documented in this report are preliminary and upon coder review may  be revised to meet current compliance requirements. Shirley Friar, MD 01/30/2016 10:48:34 AM This report has been signed electronically. Number of Addenda: 0

## 2016-01-30 NOTE — Discharge Instructions (Signed)

## 2016-01-30 NOTE — H&P (Signed)
Date of Initial H&P: 01/15/16  History reviewed, patient examined, no change in status, stable for surgery.

## 2016-01-30 NOTE — Anesthesia Procedure Notes (Addendum)
Procedure Name: MAC Date/Time: 01/30/2016 10:12 AM Performed by: Rogelia BogaMUELLER, Vishaal Strollo P Pre-anesthesia Checklist: Patient identified, Emergency Drugs available, Suction available, Patient being monitored and Timeout performed Patient Re-evaluated:Patient Re-evaluated prior to inductionOxygen Delivery Method: Nasal cannula

## 2016-01-30 NOTE — Transfer of Care (Signed)
Immediate Anesthesia Transfer of Care Note  Patient: Octavio Mannsamela Williams  Procedure(s) Performed: Procedure(s): ESOPHAGOGASTRODUODENOSCOPY (EGD) WITH PROPOFOL (N/A) BALLOON DILATION (N/A)  Patient Location: Endoscopy Unit  Anesthesia Type:MAC  Level of Consciousness: awake, alert , sedated and patient cooperative  Airway & Oxygen Therapy: Patient Spontanous Breathing and Patient connected to nasal cannula oxygen  Post-op Assessment: Report given to RN, Post -op Vital signs reviewed and stable and Patient moving all extremities X 4  Post vital signs: Reviewed and stable  Last Vitals:  Vitals:   01/30/16 0843 01/30/16 1050  BP: 119/71 101/67  Pulse: 73 67  Resp: 14 13  Temp: 36.9 C 36.5 C    Last Pain:  Vitals:   01/30/16 1050  TempSrc: Oral         Complications: No apparent anesthesia complications

## 2016-01-31 ENCOUNTER — Encounter (HOSPITAL_COMMUNITY): Payer: Self-pay | Admitting: Gastroenterology

## 2016-02-02 ENCOUNTER — Telehealth: Payer: Self-pay | Admitting: Physician Assistant

## 2016-02-02 NOTE — Telephone Encounter (Signed)
Patient called answering service stating since she had EGD on Wednesday, 01/30/16 she has had right shoulder pain radiating to the back of her shoulder as well as increased SOB. No chest pain, nausea, vomiting, dizziness, presyncope, or syncope. She stated right after [facility] that was normal, she felt "great." She continues to note right shoulder pain and SOB at this time. She is advised to go to the ED for evaluation given two recent procedures as above. She will need to follow up with GI and cardiology.

## 2016-02-03 ENCOUNTER — Emergency Department (HOSPITAL_COMMUNITY): Payer: 59

## 2016-02-03 ENCOUNTER — Encounter (HOSPITAL_COMMUNITY): Payer: Self-pay

## 2016-02-03 ENCOUNTER — Emergency Department (HOSPITAL_COMMUNITY)
Admission: EM | Admit: 2016-02-03 | Discharge: 2016-02-03 | Disposition: A | Discharge status: Home / Self Care | Payer: 59 | Attending: Emergency Medicine | Admitting: Emergency Medicine

## 2016-02-03 DIAGNOSIS — Z7982 Long term (current) use of aspirin: Secondary | ICD-10-CM | POA: Insufficient documentation

## 2016-02-03 DIAGNOSIS — R079 Chest pain, unspecified: Secondary | ICD-10-CM | POA: Insufficient documentation

## 2016-02-03 DIAGNOSIS — R0789 Other chest pain: Secondary | ICD-10-CM | POA: Diagnosis not present

## 2016-02-03 DIAGNOSIS — I5032 Chronic diastolic (congestive) heart failure: Secondary | ICD-10-CM | POA: Insufficient documentation

## 2016-02-03 DIAGNOSIS — I110 Hypertensive heart disease with heart failure: Secondary | ICD-10-CM | POA: Diagnosis not present

## 2016-02-03 LAB — BASIC METABOLIC PANEL
Anion gap: 12 (ref 5–15)
BUN: 15 mg/dL (ref 6–20)
CO2: 26 mmol/L (ref 22–32)
Calcium: 9.3 mg/dL (ref 8.9–10.3)
Chloride: 99 mmol/L — ABNORMAL LOW (ref 101–111)
Creatinine, Ser: 1.03 mg/dL — ABNORMAL HIGH (ref 0.44–1.00)
GFR calc Af Amer: >60 mL/min (ref >60)
GFR calc non Af Amer: >60 mL/min (ref >60)
Glucose, Bld: 119 mg/dL — ABNORMAL HIGH (ref 65–99)
Potassium: 4.2 mmol/L (ref 3.5–5.1)
Sodium: 137 mmol/L (ref 135–145)

## 2016-02-03 LAB — CBC
HCT: 37.5 % (ref 36.0–46.0)
Hemoglobin: 11.9 g/dL — ABNORMAL LOW (ref 12.0–15.0)
MCH: 27.7 pg (ref 26.0–34.0)
MCHC: 31.7 g/dL (ref 30.0–36.0)
MCV: 87.2 fL (ref 78.0–100.0)
Platelets: 342 10*3/uL (ref 150–400)
RBC: 4.3 MIL/uL (ref 3.87–5.11)
RDW: 16.6 % — ABNORMAL HIGH (ref 11.5–15.5)
WBC: 9.2 10*3/uL (ref 4.0–10.5)

## 2016-02-03 LAB — I-STAT TROPONIN, ED: Troponin i, poc: 0 ng/mL (ref 0.00–0.08)

## 2016-02-03 MED ORDER — SODIUM CHLORIDE 0.9 % IV BOLUS (SEPSIS)
1000.0000 mL | Freq: Once | INTRAVENOUS | Status: AC
  Administered 2016-02-03: 1000 mL via INTRAVENOUS

## 2016-02-03 MED ORDER — OXYCODONE-ACETAMINOPHEN 5-325 MG PO TABS: 1.0000 | ORAL_TABLET | ORAL | 0 refills | Status: DC | PRN

## 2016-02-03 MED ORDER — ONDANSETRON HCL 4 MG/2ML IJ SOLN
4.0000 mg | Freq: Once | INTRAMUSCULAR | Status: AC
  Administered 2016-02-03: 4 mg via INTRAVENOUS
  Filled 2016-02-03: qty 2

## 2016-02-03 MED ORDER — MORPHINE SULFATE (PF) 4 MG/ML IV SOLN
4.0000 mg | Freq: Once | INTRAVENOUS | Status: AC
  Administered 2016-02-03: 4 mg via INTRAVENOUS
  Filled 2016-02-03: qty 1

## 2016-02-03 MED ORDER — FAMOTIDINE IN NACL 20-0.9 MG/50ML-% IV SOLN
20.0000 mg | Freq: Once | INTRAVENOUS | Status: AC
  Administered 2016-02-03: 20 mg via INTRAVENOUS
  Filled 2016-02-03: qty 50

## 2016-02-03 NOTE — Discharge Instructions (Signed)
Tests showed no life-threatening condition. New medication for pain. Follow-up your primary care doctor.

## 2016-02-03 NOTE — ED Provider Notes (Addendum)
[facility]PT Provider Note   CSN: [id number] Arrival date & time: 02/03/16  1352     History   Chief Complaint Chief Complaint  Patient presents with  . Chest Pain    HPI Alicia Wood is a 45 y.o. female.  Patient presents with right sided chest pain with radiation to the epigastrium described a sharp.  Status post end[place] 4 [place] ago [place]. Patient has a history of pericarditis. She takes pantoprazole 40 mg daily for her gastritis. She says she is short of breath with ambulation. No fever, sweats, chills, cough, nausea, vomiting, diarrhea. CT angiogram on 01/06/16 shows no pulmonary embolism. She has small-to-moderate. Cardio effusion at that time.  Status post recent cardiac catheterization      Past Medical History:  Diagnosis Date  . CHF (congestive heart failure) (HCC)   . GERD (gastroesophageal reflux disease)   . Headache   . Hyperlipidemia   . Hypertension    Patient states she does not have hypertension.   . Pericardial effusion     Patient Active Problem List   Diagnosis Date Noted  . Epigastric abdominal tenderness 01/30/2016  . Dysphagia 01/30/2016  . GERD (gastroesophageal reflux disease) 01/30/2016  . Pericarditis 12/17/2015  . Constrictive pericarditis   . Chronic diastolic CHF (congestive heart failure) (HCC)   . Pulmonary hypertension   . Precordial pain 12/05/2015  . Hypotension 12/05/2015  . Arterial hypotension   . Pericardial effusion 11/22/2015    Past Surgical History:  Procedure Laterality Date  . ABDOMINAL HYSTERECTOMY    . APPENDECTOMY    . CARDIAC CATHETERIZATION N/A 01/21/2016   Procedure: Right/Left Heart Cath and Coronary Angiography;  Surgeon: Kathleene Hazelhristopher D McAlhany, MD;  [facility];  Service: Cardiovascular;  Laterality: N/A;  . ESOPHAGOGASTRODUODENOSCOPY (EGD) WITH PROPOFOL N/A 01/30/2016   Procedure: ESOPHAGOGASTRODUODENOSCOPY (EGD) WITH PROPOFOL;  Surgeon: Charlott RakesVincent Schooler, MD;  [facility];  Service: Endoscopy;  Laterality: N/A;  . PARTIAL HYSTERECTOMY    . TUBAL LIGATION      OB History    No data available       Home Medications    Prior to Admission medications   Medication Sig Start Date End Date Taking? Authorizing Provider  aspirin EC 81 MG tablet Take 1 tablet (81 mg total) by mouth daily. 12/21/15  Yes Little IshikawaErin E Smith, NP  colchicine 0.6 MG tablet Take 1 tablet (0.6 mg total) by mouth 2 (two) times daily. 12/21/15  Yes Little IshikawaErin E Smith, NP  furosemide (LASIX) 20 MG tablet Take 1 tablet (20 mg total) by mouth daily. Daily as needed. Patient taking differently: Take 20 mg by mouth daily as needed for fluid. Daily as needed. 01/14/16 04/13/16 Yes [name], PA  pantoprazole (PROTONIX) 40 MG tablet Take 1 tablet (40 mg total) by mouth daily. 12/11/15  Yes Belkys A Regalado, MD  polyethylene glycol powder (MIRALAX) powder Take 17 g by mouth daily. Patient taking differently: Take 17 g by mouth daily as needed for moderate constipation.  01/06/16  Yes Azalia BilisKevin Campos, MD  predniSONE (DELTASONE) 10 MG tablet Take 80mg  daily x 7 days, 70mg  daily x 7 days, 60mg  x 7 days, 50mg  x 7 days, 40mg  x 7 days, 30mg  x 7 days, 20mg  x 7 days, 10mg  x 7 days. 12/21/15  Yes Little IshikawaErin E Smith, NP  senna-docusate (SENOKOT-S) 8.6-50 MG tablet Take 1 tablet by mouth 2 (two) times daily. Patient taking differently: Take 1 tablet by mouth daily as needed.  12/10/15  Yes Belkys A Regalado, MD  traMADol (ULTRAM) 50 MG tablet Take 50 mg by mouth 3 (three) times daily as needed for moderate pain.   Yes Historical Provider, MD  oxyCODONE-acetaminophen (PERCOCET) 5-325 MG tablet Take 1-2 tablets by mouth every 4 (four) hours as needed. 02/03/16   Donnetta HutchingBrian Kristain Hu, MD    Family History Family History  Problem Relation Age of Onset  . Hypertension Mother   . Cancer Mother     Social History Social History  Substance Use Topics  . Smoking status: Never Smoker  . Smokeless tobacco: Never Used  . Alcohol use  Yes     Comment: occasional     Allergies   Patient has no known allergies.   Review of Systems Review of Systems  All other systems reviewed and are negative.    Physical Exam Updated Vital Signs BP 116/88   Pulse 82   Temp 98.5 F (36.9 C) (Oral)   Resp 26   Ht 5\' 8"  (1.727 m)   Wt 260 lb (117.9 kg)   SpO2 98%   BMI 39.53 kg/m   Physical Exam  Constitutional: She is oriented to person, place, and time.  Obese, nad  HENT:  Head: Normocephalic and atraumatic.  Eyes: Conjunctivae are normal.  Neck: Neck supple.  Cardiovascular: Normal rate and regular rhythm.   Pulmonary/Chest: Effort normal and breath sounds normal.  Abdominal: Soft. Bowel sounds are normal.  Musculoskeletal: Normal range of motion.  Neurological: She is alert and oriented to person, place, and time.  Skin: Skin is warm and dry.  Psychiatric: She has a normal mood and affect. Her behavior is normal.  Nursing note and vitals reviewed.    ED Treatments / Results  Labs (all labs ordered are listed, but only abnormal results are displayed) Labs Reviewed  BASIC METABOLIC PANEL - Abnormal; Notable for the following:       Result Value   Chloride 99 (*)    Glucose, Bld 119 (*)    Creatinine, Ser 1.03 (*)    All other components within normal limits  CBC - Abnormal; Notable for the following:    Hemoglobin 11.9 (*)    RDW 16.6 (*)    All other components within normal limits  I-STAT TROPOININ, ED    EKG  EKG Interpretation  Date/Time:  Sunday February 03 2016 13:57:43 EST Ventricular Rate:  104 PR Interval:  130 QRS Duration: 72 QT Interval:  316 QTC Calculation: 415 R Axis:   18 Text Interpretation:  Sinus tachycardia Otherwise normal ECG Confirmed by Adriana SimasOOK  MD, Dontea Corlew ([id number]) on 02/03/2016 5:27:33 PM       Radiology Dg Chest Portable 1 View  Result Date: 02/03/2016 CLINICAL DATA:  Chest pressure for 3 days EXAM: PORTABLE CHEST 1 VIEW COMPARISON:  01/06/2016 FINDINGS: The heart  size and mediastinal contours are within normal limits. Both lungs are clear. The visualized skeletal structures are unremarkable. IMPRESSION: No active disease. Electronically Signed   By: Elige KoHetal  Patel   On: 02/03/2016 15:16    Procedures Procedures (including critical care time)  Medications Ordered in ED Medications  ondansetron (ZOFRAN) injection 4 mg (4 mg Intravenous Given 02/03/16 1543)  sodium chloride 0.9 % bolus 1,000 mL (1,000 mLs Intravenous New Bag/Given 02/03/16 1543)  famotidine (PEPCID) IVPB 20 mg premix (0 mg Intravenous Stopped 02/03/16 1656)  morphine 4 MG/ML injection 4 mg (4 mg Intravenous Given 02/03/16 1747)     Initial Impression / Assessment and Plan / ED Course  I have reviewed the triage vital signs and the nursing notes.  Pertinent labs & imaging results that were available during my care of the patient were reviewed by me and considered in my medical decision making (see chart for details).  Clinical Course     Patient does not appear toxic.  Green labs, EKG, chest x-ray showed no acute findings. Discharge medication Percocet. She has primary care follow-up  Final Clinical Impressions(s) / ED Diagnoses   Final diagnoses:  Chest pain, unspecified type    New Prescriptions New Prescriptions   OXYCODONE-ACETAMINOPHEN (PERCOCET) 5-325 MG TABLET    Take 1-2 tablets by mouth every 4 (four) hours as needed.     Donnetta Hutching, MD 02/03/16 1756    Donnetta Hutching, MD 02/03/16 [phone]

## 2016-02-03 NOTE — ED Notes (Signed)
Pt states she understands instructions. Home ambulatory with steady gait.

## 2016-02-03 NOTE — ED Notes (Signed)
Nurse will get blood. 

## 2016-02-03 NOTE — ED Notes (Signed)
IV removed. Pt informed of upcoming discharge. Pt getting dressed. 

## 2016-02-03 NOTE — ED Notes (Signed)
OOB to br wityh steady gait

## 2016-02-03 NOTE — ED Triage Notes (Signed)
Patient complains of intermittent sharp chest pressure since Wednesday following endo. Pain worse with movement. No associated symptoms. NAD

## 2016-02-04 ENCOUNTER — Ambulatory Visit: Payer: Self-pay | Admitting: *Deleted

## 2016-02-04 ENCOUNTER — Telehealth: Payer: Self-pay | Admitting: Cardiology

## 2016-02-04 ENCOUNTER — Encounter: Payer: Self-pay | Admitting: *Deleted

## 2016-02-04 ENCOUNTER — Encounter (HOSPITAL_COMMUNITY): Payer: Self-pay | Admitting: Emergency Medicine

## 2016-02-04 ENCOUNTER — Emergency Department (HOSPITAL_COMMUNITY)
Admission: EM | Admit: 2016-02-04 | Discharge: 2016-02-05 | Disposition: A | Discharge status: Home / Self Care | Payer: 59 | Attending: Emergency Medicine | Admitting: Emergency Medicine

## 2016-02-04 ENCOUNTER — Emergency Department (HOSPITAL_COMMUNITY): Payer: 59

## 2016-02-04 ENCOUNTER — Other Ambulatory Visit: Payer: Self-pay | Admitting: *Deleted

## 2016-02-04 DIAGNOSIS — I110 Hypertensive heart disease with heart failure: Secondary | ICD-10-CM | POA: Insufficient documentation

## 2016-02-04 DIAGNOSIS — R0789 Other chest pain: Secondary | ICD-10-CM | POA: Diagnosis not present

## 2016-02-04 DIAGNOSIS — I351 Nonrheumatic aortic (valve) insufficiency: Secondary | ICD-10-CM | POA: Diagnosis not present

## 2016-02-04 DIAGNOSIS — I5032 Chronic diastolic (congestive) heart failure: Secondary | ICD-10-CM | POA: Diagnosis not present

## 2016-02-04 DIAGNOSIS — Z7982 Long term (current) use of aspirin: Secondary | ICD-10-CM | POA: Diagnosis not present

## 2016-02-04 DIAGNOSIS — R072 Precordial pain: Secondary | ICD-10-CM | POA: Diagnosis present

## 2016-02-04 DIAGNOSIS — R079 Chest pain, unspecified: Secondary | ICD-10-CM | POA: Diagnosis not present

## 2016-02-04 LAB — CBC
HCT: 34.3 % — ABNORMAL LOW (ref 36.0–46.0)
Hemoglobin: 10.9 g/dL — ABNORMAL LOW (ref 12.0–15.0)
MCH: 27.9 pg (ref 26.0–34.0)
MCHC: 31.8 g/dL (ref 30.0–36.0)
MCV: 87.7 fL (ref 78.0–100.0)
Platelets: 289 10*3/uL (ref 150–400)
RBC: 3.91 MIL/uL (ref 3.87–5.11)
RDW: 16.6 % — ABNORMAL HIGH (ref 11.5–15.5)
WBC: 9.3 10*3/uL (ref 4.0–10.5)

## 2016-02-04 LAB — COMPREHENSIVE METABOLIC PANEL
ALT: 16 U/L (ref 14–54)
AST: 15 U/L (ref 15–41)
Albumin: 3.5 g/dL (ref 3.5–5.0)
Alkaline Phosphatase: 69 U/L (ref 38–126)
Anion gap: 11 (ref 5–15)
BUN: 15 mg/dL (ref 6–20)
CO2: 28 mmol/L (ref 22–32)
Calcium: 9.1 mg/dL (ref 8.9–10.3)
Chloride: 100 mmol/L — ABNORMAL LOW (ref 101–111)
Creatinine, Ser: 0.82 mg/dL (ref 0.44–1.00)
GFR calc Af Amer: >60 mL/min (ref >60)
GFR calc non Af Amer: >60 mL/min (ref >60)
Glucose, Bld: 84 mg/dL (ref 65–99)
Potassium: 3.8 mmol/L (ref 3.5–5.1)
Sodium: 139 mmol/L (ref 135–145)
Total Bilirubin: 0.4 mg/dL (ref 0.3–1.2)
Total Protein: 6.5 g/dL (ref 6.5–8.1)

## 2016-02-04 LAB — MAGNESIUM: Magnesium: 2 mg/dL (ref 1.7–2.4)

## 2016-02-04 LAB — PROTIME-INR
INR: 0.92
Prothrombin Time: 12.4 seconds (ref 11.4–15.2)

## 2016-02-04 LAB — TROPONIN I: Troponin I: <0.03 ng/mL (ref <0.03)

## 2016-02-04 LAB — BRAIN NATRIURETIC PEPTIDE: B Natriuretic Peptide: 38.3 pg/mL (ref 0.0–100.0)

## 2016-02-04 LAB — CK: Total CK: <5 U/L — ABNORMAL LOW (ref 38–234)

## 2016-02-04 LAB — I-STAT TROPONIN, ED: Troponin i, poc: 0 ng/mL (ref 0.00–0.08)

## 2016-02-04 MED ORDER — LIDOCAINE VISCOUS 2 % MT SOLN
15.0000 mL | Freq: Once | OROMUCOSAL | Status: AC
  Administered 2016-02-04: 15 mL via OROMUCOSAL
  Filled 2016-02-04: qty 15

## 2016-02-04 MED ORDER — HYDROMORPHONE HCL 2 MG/ML IJ SOLN
1.0000 mg | Freq: Once | INTRAMUSCULAR | Status: AC
  Administered 2016-02-04: 1 mg via INTRAMUSCULAR
  Filled 2016-02-04: qty 1

## 2016-02-04 MED ORDER — ASPIRIN 81 MG PO CHEW
324.0000 mg | CHEWABLE_TABLET | Freq: Once | ORAL | Status: DC
  Filled 2016-02-04: qty 4

## 2016-02-04 MED ORDER — KETOROLAC TROMETHAMINE 15 MG/ML IJ SOLN
10.0000 mg | Freq: Once | INTRAMUSCULAR | Status: AC
  Administered 2016-02-04: 10 mg via INTRAVENOUS
  Filled 2016-02-04: qty 1

## 2016-02-04 NOTE — ED Triage Notes (Signed)
Pt from home via [facility]. Pt sts she has been having intermittent CP since August. Was seen for same yesterday. Hx of pericarditis, & heart cath. Rates pain @ 10/10 in center of chest. Pain made worse w/ inspiration, movement, etc. Denies N/V. Endorses back pain, weakness, lightheadedness/dizziness, & SHOB. Took daily 81mg  ASA, EMS gave pt 243mg  ASA PTA. A&O x4 on arrival.

## 2016-02-04 NOTE — Patient Outreach (Addendum)
[facility] ([facility]) Care Management  [facility] Care Manager  02/04/2016   Kathrin Williams 06/01/1970 [id number]  Subjective: Telephone call to [name], no answer, left HIPAA compliant voicemail message for Ryan, and requested call back regarding status of pain management clinic appointment.  Telephone call to patient's home / mobile phone number, spoke with patient, states she has been hurting since EGD on 01/30/16, and EGD bioposy results can take up to 10 days to receive results.   States she went to ED yesterday (02/03/16) and states ED did not do anything for her.   States she has called the cardiology and gastroenterologist office this morning to report symptoms and is waiting for a call back to advise on next steps.     Patient states she has not taken any pain medicine because it does not do her any good.    RNCM advised patient to take pain medicine as prescribed to see if it may help and follow up with MD's office again if she does not receive a call back soon.   Patient voiced understanding and states she will follow up.  States she does not feel like talking right now and will call RNCM back at a later time. Patient will receive ongoing care coordination services.   Objective: Per chart review: Patient had outpatient EGD on 01/30/16.  Patient had outpatient cardiac cath on 01/21/16.   Patient hospitalized 12/17/15 - 12/21/15 for pericarditis and pericardial effusion. Patient hospitalized 12/05/15 - 12/10/15 for chronic diastolic congestive heart failure, pulmonary hypertension, and pericardial effusion. Patient hospitalized 11/22/15 - 11/29/15 for chest pain and pericardial effusion. ED visit on 04/24/15 for leg pain. ED visit on 08/20/15 for chest pain. ED visit on 11/11/15 for chest pain.  ED visit on 01/06/16 for chest pain.  ED visit on 02/03/16 for chest pain.   Patient also has a history of hyperlipidemia  Encounter Medications:  Outpatient Encounter  Prescriptions as of 02/04/2016  Medication Sig Note  . aspirin EC 81 MG tablet Take 1 tablet (81 mg total) by mouth daily.   . colchicine 0.6 MG tablet Take 1 tablet (0.6 mg total) by mouth 2 (two) times daily.   . furosemide (LASIX) 20 MG tablet Take 1 tablet (20 mg total) by mouth daily. Daily as needed. (Patient taking differently: Take 20 mg by mouth daily as needed for fluid. Daily as needed.)   . oxyCODONE-acetaminophen (PERCOCET) 5-325 MG tablet Take 1-2 tablets by mouth every 4 (four) hours as needed.   . pantoprazole (PROTONIX) 40 MG tablet Take 1 tablet (40 mg total) by mouth daily.   . polyethylene glycol powder (MIRALAX) powder Take 17 g by mouth daily. (Patient taking differently: Take 17 g by mouth daily as needed for moderate constipation. )   . predniSONE (DELTASONE) 10 MG tablet Take 80mg daily x 7 days, 70mg daily x 7 days, 60mg x 7 days, 50mg x 7 days, 40mg x 7 days, 30mg x 7 days, 20mg x 7 days, 10mg x 7 days. 01/21/2016: On day four  . senna-docusate (SENOKOT-S) 8.6-50 MG tablet Take 1 tablet by mouth 2 (two) times daily. (Patient taking differently: Take 1 tablet by mouth daily as needed. )   . traMADol (ULTRAM) 50 MG tablet Take 50 mg by mouth 3 (three) times daily as needed for moderate pain.    No facility-administered encounter medications on file as of 02/04/2016.     Functional Status:  In your present state of health, do you   have any difficulty performing the following activities: 01/21/2016 01/16/2016  Hearing? N N  Vision? N N  Difficulty concentrating or making decisions? N N  Walking or climbing stairs? N N  Dressing or bathing? N N  Doing errands, shopping? - N  Preparing Food and eating ? - N  Using the Toilet? - N  In the past six months, have you accidently leaked urine? - N  Do you have problems with loss of bowel control? - N  Managing your Medications? - N  Managing your Finances? - N  Housekeeping or managing your Housekeeping? - N  Some recent  data might be hidden    Fall/Depression Screening: PHQ 2/9 Scores 01/28/2016 01/16/2016 12/25/2015  PHQ - 2 Score 0 4 4  PHQ- 9 Score - 13 13   Fall Risk  01/28/2016 01/16/2016 12/25/2015  Falls in the past year? Yes Yes Yes  Number falls in past yr: 2 or more 2 or more 2 or more  Injury with Fall? Yes Yes Yes  Risk Factor Category  High Fall Risk High Fall Risk -  Risk for fall due to : History of fall(s) History of fall(s) History of fall(s)  Follow up Education provided Education provided Falls prevention discussed   [facility] CM Care Plan Problem One   Flowsheet Row Most Recent Value  Care Plan Problem One  Patient has had 3 admissions since January of 2017 for chest pain.   Role Documenting the Problem One  Care Management Telephonic Coordinator  Care Plan for Problem One  Active  [facility] Long Term Goal (31-90 days)  Patient will not have an admission as evidence by no admission within next 31 days.  [facility] Long Term Goal Start Date  01/03/16  [facility] Long Term Goal Met Date  -- [Patient has not had any inpatient hospitalizations 12/17/15.]  Interventions for Problem One Long Term Goal  RNCM educated patient on signs and symptoms to report to MD prior to ED visit.  [facility] CM Short Term Goal #1 (0-30 days)  Patient will not have an admission as evidence by no admission within 21 days for pain.     [facility] CM Short Term Goal #1 Start Date  01/03/16  [facility] CM Short Term Goal #1 Met Date  02/04/16 [Patient has not had any inpatient admits since 12/17/15. ]  Interventions for Short Term Goal #1  RNCM will follow up with cardiologist regarding patient's diagnosis concerns.  Patient will follow up with primary MD regarding pain management strategies.  [facility] CM Short Term Goal #2 (0-30 days)  Patient will not have any ED visits for chest pain within the next 30 days.   [facility] CM Short Term Goal #2 Start Date  02/04/16  Interventions for Short Term Goal #2  RNCM educated patient to take pain medications as prescribed.    Educated patient to continue to follow up with cardiologist and gastroenterologist with signs and symptoms prior to ED visit.      Assessment: Received [facility] Transition of care referral on 12/10/15. Transition of care follow up ongoing and telephone screen completed. Pain management appointment scheduling pending, MD review.Patient will continue to receive ongoing care coordination for pain management.  Plan: RNCM will send barrier / condition update letter, care plan,01/16/16 initial assessment and 01/16/16, 02/04/16 progress notes to primary MD via Epic fax.  RNCM will call patient for telephone outreach, care coordination follow up, within 10 business days if no return call. RNCM will call patient to verify result   of  01/30/16/17 EGD bioposy, and determine care coordination next steps, within 2 business weeks. RNCM will discuss case update in difficult case discussion within 4 business weeks if appropriate.  Darlene H. Cooper RN, BSN, CCM [facility] Care Management [facility] Telephonic CM Phone: [phone] Fax: [phone]  

## 2016-02-04 NOTE — ED Provider Notes (Signed)
[facility] DEPT Provider Note   CSN: [id number] Arrival date & time: 02/04/16  1926     History   Chief Complaint Chief Complaint  Patient presents with  . Chest Pain    HPI Alicia Wood is a 45 y.o. female.  HPI  Patient presents with concern of chest pain. Patient had endoscopy 5 days ago he notes that since that time she has had ongoing pain. Patient was seen here yesterday for similar concerns. She notes that since discharge she felt too unwell to obtain prescribed analgesia. No new complaints, including no new syncope, fever, vomiting. Chest pain is substernal, sharp, severe, nonradiating, with associated dyspnea secondary to pain. Patient also has a history of pericarditis in the distant past.   Past Medical History:  Diagnosis Date  . CHF (congestive heart failure) (HCC)   . GERD (gastroesophageal reflux disease)   . Headache   . Hyperlipidemia   . Hypertension    Patient states she does not have hypertension.   . Pericardial effusion     Patient Active Problem List   Diagnosis Date Noted  . Epigastric abdominal tenderness 01/30/2016  . Dysphagia 01/30/2016  . GERD (gastroesophageal reflux disease) 01/30/2016  . Pericarditis 12/17/2015  . Constrictive pericarditis   . Chronic diastolic CHF (congestive heart failure) (HCC)   . Pulmonary hypertension   . Precordial pain 12/05/2015  . Hypotension 12/05/2015  . Arterial hypotension   . Pericardial effusion 11/22/2015    Past Surgical History:  Procedure Laterality Date  . ABDOMINAL HYSTERECTOMY    . APPENDECTOMY    . CARDIAC CATHETERIZATION N/A 01/21/2016   Procedure: Right/Left Heart Cath and Coronary Angiography;  Surgeon: Kathleene Hazel, MD;  Location: [facility] INVASIVE CV LAB;  Service: Cardiovascular;  Laterality: N/A;  . ESOPHAGOGASTRODUODENOSCOPY (EGD) WITH PROPOFOL N/A 01/30/2016   Procedure: ESOPHAGOGASTRODUODENOSCOPY (EGD) WITH PROPOFOL;  Surgeon: [name], MD;  Location: [facility]  ENDOSCOPY;  Service: Endoscopy;  Laterality: N/A;  . PARTIAL HYSTERECTOMY    . TUBAL LIGATION      OB History    No data available       Home Medications    Prior to Admission medications   Medication Sig Start Date End Date Taking? Authorizing Provider  aspirin EC 81 MG tablet Take 1 tablet (81 mg total) by mouth daily. 12/21/15   Little Ishikawa, NP  colchicine 0.6 MG tablet Take 1 tablet (0.6 mg total) by mouth 2 (two) times daily. 12/21/15   Little Ishikawa, NP  furosemide (LASIX) 20 MG tablet Take 1 tablet (20 mg total) by mouth daily. Daily as needed. Patient taking differently: Take 20 mg by mouth daily as needed for fluid. Daily as needed. 01/14/16 04/13/16  Azalee Course, PA  oxyCODONE-acetaminophen (PERCOCET) 5-325 MG tablet Take 1-2 tablets by mouth every 4 (four) hours as needed. 02/03/16   Donnetta Hutching, MD  pantoprazole (PROTONIX) 40 MG tablet Take 1 tablet (40 mg total) by mouth daily. 12/11/15   Belkys A Regalado, MD  polyethylene glycol powder (MIRALAX) powder Take 17 g by mouth daily. Patient taking differently: Take 17 g by mouth daily as needed for moderate constipation.  01/06/16   Azalia Bilis, MD  predniSONE (DELTASONE) 10 MG tablet Take 80mg  daily x 7 days, 70mg  daily x 7 days, 60mg  x 7 days, 50mg  x 7 days, 40mg  x 7 days, 30mg  x 7 days, 20mg  x 7 days, 10mg  x 7 days. 12/21/15   Little Ishikawa, NP  senna-docusate (SENOKOT-S) 8.6-50 MG tablet  Take 1 tablet by mouth 2 (two) times daily. Patient taking differently: Take 1 tablet by mouth daily as needed.  12/10/15   Belkys A Regalado, MD  traMADol (ULTRAM) 50 MG tablet Take 50 mg by mouth 3 (three) times daily as needed for moderate pain.    Historical Provider, MD    Family History Family History  Problem Relation Age of Onset  . Hypertension Mother   . Cancer Mother     Social History Social History  Substance Use Topics  . Smoking status: Never Smoker  . Smokeless tobacco: Never Used  . Alcohol use Yes     Comment:  occasional     Allergies   Patient has no known allergies.   Review of Systems Review of Systems  Constitutional:       Per HPI, otherwise negative  HENT:       Per HPI, otherwise negative  Respiratory:       Per HPI, otherwise negative  Cardiovascular:       Per HPI, otherwise negative  Gastrointestinal: Negative for vomiting.  Endocrine:       Negative aside from HPI  Genitourinary:       Neg aside from HPI   Musculoskeletal:       Per HPI, otherwise negative  Skin: Negative.   Allergic/Immunologic: Negative for immunocompromised state.  Neurological: Negative for syncope.     Physical Exam Updated Vital Signs BP 111/82 (BP Location: Left Arm)   Pulse 88   Temp 98.1 F (36.7 C) (Oral)   Resp (!) 29   Ht 5\' 8"  (1.727 m)   Wt 260 lb (117.9 kg)   SpO2 100%   BMI 39.53 kg/m   Physical Exam  Constitutional: She is oriented to person, place, and time. She appears well-developed and well-nourished. No distress.  HENT:  Head: Normocephalic and atraumatic.  Eyes: Conjunctivae and EOM are normal.  Cardiovascular: Normal rate and regular rhythm.  Exam reveals no friction rub.   Pulmonary/Chest: Effort normal and breath sounds normal. No stridor. No respiratory distress.  Abdominal: She exhibits no distension.  Musculoskeletal: She exhibits no edema.  Neurological: She is alert and oriented to person, place, and time. No cranial nerve deficit.  Skin: Skin is warm and dry.  Psychiatric: She has a normal mood and affect.  Nursing note and vitals reviewed.    ED Treatments / Results  Labs (all labs ordered are listed, but only abnormal results are displayed) Labs Reviewed  CBC - Abnormal; Notable for the following:       Result Value   Hemoglobin 10.9 (*)    HCT 34.3 (*)    RDW 16.6 (*)    All other components within normal limits  COMPREHENSIVE METABOLIC PANEL  MAGNESIUM  PROTIME-INR  TROPONIN I  CK  BRAIN NATRIURETIC PEPTIDE  I-STAT TROPOININ, ED     EKG EKG with sinus rhythm, rate 86, low voltage, hypertrophic changes, Baseline wander, abnormal This is consistent with patient's rhythm strip provided by EMS providers, low voltage, rate 97, nonspecific T wave changes abnormal as well Chart review notable for multiple ED visits recently, including yesterday, as well as cardiac catheterization last month, results as below.  Ordering physician: Kathleene Hazelhristopher D McAlhany, MD Study date: 01/21/16  Physicians  Panel Physicians Referring Physician Case Authorizing Physician  Kathleene Hazelhristopher D McAlhany, MD (Primary)    Procedures  Right/Left Heart Cath and Coronary Angiography  Conclusion  1. No angiographic evidence of CAD.  2. Normal filling pressures.  3. No hemodynamic findings that are suggestive of constrictive physiology.      Radiology Dg Chest Portable 1 View  Result Date: 02/03/2016 CLINICAL DATA:  Chest pressure for 3 days EXAM: PORTABLE CHEST 1 VIEW COMPARISON:  01/06/2016 FINDINGS: The heart size and mediastinal contours are within normal limits. Both lungs are clear. The visualized skeletal structures are unremarkable. IMPRESSION: No active disease. Electronically Signed   By: Elige KoHetal  Patel   On: 02/03/2016 15:16    Procedures Procedures (including critical care time)  Medications Ordered in ED Medications  aspirin chewable tablet 324 mg (0 mg Oral Hold 02/04/16 2014)  lidocaine (XYLOCAINE) 2 % viscous mouth solution 15 mL (15 mLs Mouth/Throat Given 02/04/16 2014)  ketorolac (TORADOL) 15 MG/ML injection 10 mg (10 mg Intravenous Given 02/04/16 2017)   Ordering physician: Kathleene Hazelhristopher D McAlhany, MD Study date: 01/21/16  Physicians  Panel Physicians Referring Physician Case Authorizing Physician  Kathleene Hazelhristopher D McAlhany, MD (Primary)    Procedures  Right/Left Heart Cath and Coronary Angiography  Conclusion  1. No angiographic evidence of CAD.  2. Normal filling pressures.  3. No hemodynamic findings that are suggestive  of constrictive physiology.      Initial Impression / Assessment and Plan / ED Course  I have reviewed the triage vital signs and the nursing notes.  Pertinent labs & imaging results that were available during my care of the patient were reviewed by me and considered in my medical decision making (see chart for details).  Clinical Course     Update: On repeat exam the patient appears sleepy, but when asked about her pain she states that it is excruciating. Vital signs unremarkable. She is now with a companion. Together we discussed the patient's ongoing pain, in spite of reassuring studies today, yesterday, and with her recent catheterization. We discussed the importance of obtaining previously prescribed prescription, as well as attempting new topical therapy, provided today. Patient will follow up with GI, given the recent procedure, and her ongoing pain, in spite of reassuring findings, they do not provide ACS, ED, pneumonia or other acute new pathology.   Final Clinical Impressions(s) / ED Diagnoses   Final diagnoses:  Atypical chest pain    New Prescriptions New Prescriptions   SUCRALFATE (CARAFATE) 1 GM/10ML SUSPENSION    Take 10 mLs (1 g total) by mouth 4 (four) times daily -  with meals and at bedtime. Please use three times daily for the next five days.     Gerhard Munchobert Byron Peacock, MD 02/05/16 [phone]

## 2016-02-04 NOTE — Telephone Encounter (Signed)
I spoke to patient. She c/o chest pain, SOB, weakness, pain and swelling in both legs, can not bare weight on right leg.  I advised her to call 911 or go to ED.  [facility] and EKG, blood work, and chest 2V were WNL.  She states they "always are".  She states the last time she was diagnosed with a heart condition, CT was the only test that "told" anything.  She states she is tired of hurting and being short of breath. I asked if she was taking the Percocet she was rx'd last night, she states no.  She reports she is waiting on GI to call her back, to see what he says (s/p endoscopy last Wednesday). I advised her again to go back to ED due to severity of symptoms she is having.  She refuses until she talks to GI. I advised her to please obtain care somewhere today. She voiced understanding and thanks.

## 2016-02-04 NOTE — Telephone Encounter (Signed)
Good to know :)  Thank you

## 2016-02-04 NOTE — Telephone Encounter (Signed)
New Message  Pt c/o of Chest Pain: STAT if CP now or developed within 24 hours  1. Are you having CP right now? Yes, since Wednesday  2. Are you experiencing any other symptoms (ex. SOB, nausea, vomiting, sweating)? SOB, Sweating, Cath down to foot sheet of ice  3. How long have you been experiencing CP? Wednesday  4. Is your CP continuous or coming and going? Continous  5. Have you taken Nitroglycerin? No, does not have it ?

## 2016-02-05 ENCOUNTER — Other Ambulatory Visit: Payer: Self-pay | Admitting: *Deleted

## 2016-02-05 ENCOUNTER — Ambulatory Visit: Payer: Self-pay | Admitting: *Deleted

## 2016-02-05 MED ORDER — SUCRALFATE 1 GM/10ML PO SUSP: 1.0000 g | Freq: Three times a day (TID) | ORAL | 0 refills | Status: DC

## 2016-02-05 NOTE — Discharge Instructions (Signed)
As discussed, your evaluation today has been largely reassuring.  But, it is important that you monitor your condition carefully, and do not hesitate to return to the ED if you develop new, or concerning changes in your condition.  Otherwise, please follow-up with your physician for appropriate ongoing care.Please be sure to call tomorrow for the next available appointment.

## 2016-02-05 NOTE — Patient Outreach (Addendum)
[facility]) Care Management  [place]  02/05/2016   Alicia Wood [birth year] [id number]  Subjective: Telephone call to patient's home / mobile number, spoke with patient, and HIPAA verified.  States she is not doing too good, is in pain, getting ready to take new pain medication that was prescribed at the ED last night, a friend just came over to assist, and states she will call RNCM  back at a later time.    States she does not feel up to talking right now.   Patient will continue to receive ongoing transition of care and pain management care coordination.   Objective: Per chart review: Patient had outpatient EGD on 01/30/16.  Patient had outpatient cardiac cath on 01/21/16. Patient hospitalized 12/17/15 - 12/21/15 for pericarditis and pericardial effusion. Patient hospitalized 12/05/15 - 12/10/15 for chronic diastolic congestive heart failure, pulmonary hypertension, and pericardial effusion. Patient hospitalized 11/22/15 - 11/29/15 for chest pain and pericardial effusion. ED visit on 04/24/15 for leg pain. ED visit on 08/20/15 for chest pain. ED visit on 11/11/15 for chest pain.  ED visit on 01/06/16 for chest pain.  ED visit on 02/03/16 for chest pain.   ED visit on 02/04/16 for chest pain.   Patient also has a history of hyperlipidemia.     Encounter Medications:  Outpatient Encounter Prescriptions as of 02/05/2016  Medication Sig Note  . aspirin EC 81 MG tablet Take 1 tablet (81 mg total) by mouth daily.   . colchicine 0.6 MG tablet Take 1 tablet (0.6 mg total) by mouth 2 (two) times daily.   . furosemide (LASIX) 20 MG tablet Take 1 tablet (20 mg total) by mouth daily. Daily as needed. (Patient taking differently: Take 20 mg by mouth daily as needed for fluid. Daily as needed.)   . oxyCODONE-acetaminophen (PERCOCET) 5-325 MG tablet Take 1-2 tablets by mouth every 4 (four) hours as needed. (Patient not taking: Reported on 02/04/2016)   . pantoprazole (PROTONIX)  40 MG tablet Take 1 tablet (40 mg total) by mouth daily.   . polyethylene glycol powder (MIRALAX) powder Take 17 g by mouth daily. (Patient taking differently: Take 17 g by mouth daily as needed for moderate constipation. )   . predniSONE (DELTASONE) 10 MG tablet Take 67m daily x 7 days, 77mdaily x 7 days, 6043m 7 days, 48m63m7 days, 40mg64m days, 30mg 62mdays, 20mg x73mays, 10mg x 61mys. (Patient taking differently: Take 20 mg by mouth daily with breakfast. Take 80mg dai68m 7 days, 70mg dail76m7 days, 60mg x 7 d78m 48mg x 7 da42m40mg x 7 day37m0mg x 7 days33mmg x 7 days,20mg x 7 days.)86m30/2017: On day four  . senna-docusate (SENOKOT-S) 8.6-50 MG tablet Take 1 tablet by mouth 2 (two) times daily. (Patient taking differently: Take 1 tablet by mouth daily as needed. )   . sucralfate (CARAFATE) 1 GM/10ML suspension Take 10 mLs (1 g total) by mouth 4 (four) times daily -  with meals and at bedtime. Please use three times daily for the next five days.   . traMADol (ULTRAM) 50 MG tablet Take 50 mg by mouth 3 (three) times daily as needed for moderate pain.    No facility-administered encounter medications on file as of 02/05/2016.     Functional Status:  In your present state of health, do you have any difficulty performing the following activities: 01/21/2016 01/16/2016  Hearing? N N  Vision? N N  Difficulty concentrating or making decisions? N N  Walking or climbing stairs? N N  Dressing or bathing? N N  Doing errands, shopping? - Scientist, forensic and eating ? - N  Using the Toilet? - N  In the past six months, have you accidently leaked urine? - N  Do you have problems with loss of bowel control? - N  Managing your Medications? - N  Managing your Finances? - N  Housekeeping or managing your Housekeeping? - N  Some recent data might be hidden    Fall/Depression Screening: PHQ 2/9 Scores 01/28/2016 01/16/2016 12/25/2015  PHQ - 2 Score 0 4 4  PHQ- 9 Score - 13 13   Fall  Risk  01/28/2016 01/16/2016 12/25/2015  Falls in the past year? Yes Yes Yes  Number falls in past yr: 2 or more 2 or more 2 or more  Injury with Fall? Yes Yes Yes  Risk Factor Category  High Fall Risk High Fall Risk -  Risk for fall due to : History of fall(s) History of fall(s) History of fall(s)  Follow up Education provided Education provided Falls prevention discussed    [facility] CM Care Plan Problem One   Flowsheet Row Most Recent Value  Care Plan Problem One  Patient has had 3 admissions since January of 2017 for chest pain.   Role Documenting the Problem One  Care Management Telephonic Coordinator  Care Plan for Problem One  Active  [facility] Long Term Goal (31-90 days)  Patient will not have an admission as evidence by no admission within next 31 days.  [facility] Long Term Goal Start Date  01/03/16  [facility] Long Term Goal Met Date  -- [name] has not had any inpatient hospitalizations 12/17/15.]  Interventions for Problem One Long Term Goal  RNCM educated patient on signs and symptoms to report to MD prior to ED visit.  [facility] CM Short Term Goal #1 (0-30 days)  Patient will not have an admission as evidence by no admission within 21 days for pain.     [facility] CM Short Term Goal #1 Start Date  01/03/16  [facility] CM Short Term Goal #1 Met Date  02/04/16 [Patient has not had any inpatient admits since 12/17/15. ]  Interventions for Short Term Goal #1  RNCM will follow up with cardiologist regarding patient's diagnosis concerns.  Patient will follow up with primary MD regarding pain management strategies.  [facility] CM Short Term Goal #2 (0-30 days)  Patient will not have any ED visits for chest pain within the next 30 days.   [facility] CM Short Term Goal #2 Start Date  02/04/16  [facility] CM Short Term Goal #2 Met Date  -- [name] has ED visit on 02/04/16, states she is taking meds.]  Interventions for Short Term Goal #2  RNCM educated patient to take pain medications as prescribed.   Educated patient to continue to follow up with  cardiologist and gastroenterologist with signs and symptoms prior to ED visit.     Assessment: Received [facility] Transition of care referral on 12/10/15. Transition of care follow up ongoing and telephone screen completed. Pain management appointment scheduling pending, MD review.Patient will continue to receive ongoing care coordination for pain management.  Plan: RNCM will call patient for telephone outreach, care coordination follow up, within 10 business days if no return call. RNCM will call patient to verify result of 01/30/16/17 EGD bioposy, and determine care coordination next steps, within 2 business weeks. RNCM will  discuss case update in difficult case discussion within 4 business weeks if appropriate.  Damel Querry H. Annia Friendly, BSN, [place] Management [facility] Telephonic CM Phone: [phone] Fax: [phone]

## 2016-02-06 NOTE — Telephone Encounter (Signed)
She was to be referred to a pain clinic.  Please check the status of this.  Thanks.

## 2016-02-07 ENCOUNTER — Other Ambulatory Visit: Payer: Self-pay | Admitting: *Deleted

## 2016-02-07 NOTE — Patient Outreach (Signed)
[facility]) Care Management  02/07/2016  Alicia Wood 09/01/1970 [id number]   Subjective:   Case discussed in difficult case discussion with  Dr.  Ernestina PennaBeth Hodge and advised to follow up with patient's gastroenterologist regarding GI cocktail regimen. Patient will continue to receive ongoing transition of care and care coordination.    Objective: Per chart review: Patient had outpatient EGD on 01/30/16. Patient had outpatient cardiac cath on 01/21/16. Patient hospitalized 12/17/15 - 12/21/15 for pericarditis and pericardial effusion. Patient hospitalized 12/05/15 - 12/10/15 for chronic diastolic congestive heart failure, pulmonary hypertension, and pericardial effusion. Patient hospitalized 11/22/15 - 11/29/15 for chest pain and pericardial effusion. ED visit on 04/24/15 for leg pain. ED visit on 08/20/15 for chest pain. ED visit on 11/11/15 for chest pain.  ED visit on 01/06/16 for chest pain.  ED visit on 02/03/16 for chest pain. ED visit on 02/04/16 for chest pain.   Patient also has a history of hyperlipidemia.   Assessment: Received [facility] Transition of care referral on 12/10/15. Transition of care follow up ongoing and telephone screen completed. Pain management appointment scheduling pending, MD review.Patient will continue to receive ongoing care coordination for pain management.  Plan: RNCM will follow up with patient's  gastroenterologist regarding GI cocktail regimen, within 10 business days. RNCM will call patient for telephone outreach, care coordination follow up, within 10 business days if no return call. RNCM will call patient to verify result of11/11/08/15 EGD bioposy, and determine care coordination next steps, within 2 business weeks. RNCM will discuss case update in difficult case discussion within 4 business weeks if appropriate.  Yazlyn Wentzel H. Gardiner Barefootooper RN, BSN, CCM [facility] Telephonic CM Phone: [phone] Fax: [phone]

## 2016-02-07 NOTE — Telephone Encounter (Signed)
Original referral note states they are not accepting new patients at this time, however, Dr. Pernell DupreAdams to review chart and see if he can take referral.

## 2016-02-12 ENCOUNTER — Other Ambulatory Visit: Payer: Self-pay | Admitting: *Deleted

## 2016-02-12 NOTE — Patient Outreach (Addendum)
[facility]) Care Management  02/12/2016  Alicia Wood 07/20/1970 [id number]   Subjective: Telephone call to patient's home number, no answer, left HIPAA compliant voicemail message, and requested call back. Case discussed with Dr. Ronaldo Miyamotoobert [facility] Care Management Medical Director) via secure email.   Objective: Per chart review: Patient had outpatient EGD on 01/30/16. Patient had outpatient cardiac cath on 01/21/16. Patient hospitalized 12/17/15 - 12/21/15 for pericarditis and pericardial effusion. Patient hospitalized 12/05/15 - 12/10/15 for chronic diastolic congestive heart failure, pulmonary hypertension, and pericardial effusion. Patient hospitalized 11/22/15 - 11/29/15 for chest pain and pericardial effusion. ED visit on 04/24/15 for leg pain. ED visit on 08/20/15 for chest pain. ED visit on 11/11/15 for chest pain.  ED visit on 01/06/16 for chest pain.  ED visit on 02/03/16 for chest pain. ED visit on 02/04/16 for chest pain. Patient also has a history of hyperlipidemia.   Assessment: Received [facility] Transition of care referral on 12/10/15. Transition of care follow up ongoing and telephone screen completed. Pain management appointment scheduling pending, MD review.Patient will continue to receive ongoing care coordination for pain management.  Plan: RNCM will follow up with patient's  gastroenterologist regarding GI cocktail regimen, within 10 business days. RNCM will call patient for telephone outreach, care coordination follow up, within 10 business days if no return call. RNCM will call patient to verify result of11/11/08/15 EGD bioposy, and determine care coordination next steps, within 2 business weeks. RNCM will discuss case update in difficult case discussion within 4 business weeks if appropriate.  Bianca Raneri H. Gardiner Barefootooper RN, BSN, CCM [facility] Telephonic CM Phone: [phone] Fax: [phone]

## 2016-02-13 ENCOUNTER — Telehealth: Payer: Self-pay | Admitting: Cardiology

## 2016-02-13 ENCOUNTER — Other Ambulatory Visit: Payer: Self-pay | Admitting: *Deleted

## 2016-02-13 ENCOUNTER — Encounter: Payer: Self-pay | Admitting: Nurse Practitioner

## 2016-02-13 ENCOUNTER — Ambulatory Visit (INDEPENDENT_AMBULATORY_CARE_PROVIDER_SITE_OTHER): Payer: 59 | Admitting: Nurse Practitioner

## 2016-02-13 VITALS — BP 124/89 | HR 89 | Ht 68.0 in | Wt 288.4 lb

## 2016-02-13 DIAGNOSIS — I313 Pericardial effusion (noninflammatory): Secondary | ICD-10-CM

## 2016-02-13 DIAGNOSIS — I319 Disease of pericardium, unspecified: Secondary | ICD-10-CM | POA: Diagnosis not present

## 2016-02-13 DIAGNOSIS — I3139 Other pericardial effusion (noninflammatory): Secondary | ICD-10-CM

## 2016-02-13 DIAGNOSIS — I5032 Chronic diastolic (congestive) heart failure: Secondary | ICD-10-CM | POA: Diagnosis not present

## 2016-02-13 MED ORDER — PREDNISONE 10 MG PO TABS: ORAL_TABLET | ORAL | 0 refills | Status: DC

## 2016-02-13 NOTE — Telephone Encounter (Signed)
Pt c/o Shortness Of Breath: STAT if SOB developed within the last 24 hours or pt is noticeably SOB on the phone  1. Are you currently SOB (can you hear that pt is SOB on the phone)?yes   2. How long have you been experiencing SOB? Past few days   3. Are you SOB when sitting or when up moving around? Sitting   4. Are you currently experiencing any other symptoms? Chest pain, Tired, breathing heavy

## 2016-02-13 NOTE — Patient Outreach (Addendum)
[facility]) Care Management  [place]  02/13/2016   Alicia Wood 11-20-1970 [id number]  Subjective: Telephone call to patient's home number, spoke with patient, and HIPAA verified.   Discussed results of EGD, states she was prescribed antibiotics on 02/06/16 for 2 weeks for H. Pylori infection (Helicobacter) , and will call gastroenterologist next week to schedule follow up appointment.   States she is currently having mid chest pain, non radiating, with shortness of breath, called cardiologist office this morning, and has a follow up appointment this afternoon at 1:30pm for evaluation.   States she can no longer afford to go to ED and not receive any additional assistance.  Last dose of pain medication was on 02/11/16 and she declines to keep taking medication that is not decreasing her pain.  Cardiologist is not aware of EGD results and she will notify provider of results this afternoon during follow up visit.   Patient advised her case has been discussed with [place] Management Medical Directors,  is in agreement to continued discussion and feedback.    Patient will receive ongoing transition follow up and coordination of care. Case discussed with [place] Management Assistant Director Kingsley Callander [place]).    Objective: Per chart review: Patient had outpatient EGD on 01/30/16. Patient had outpatient cardiac cath on 01/21/16. Patient hospitalized 12/17/15 - 12/21/15 for pericarditis and pericardial effusion. Patient hospitalized 12/05/15 - 12/10/15 for chronic diastolic congestive heart failure, pulmonary hypertension, and pericardial effusion. Patient hospitalized 11/22/15 - 11/29/15 for chest pain and pericardial effusion. ED visit on 04/24/15 for leg pain. ED visit on 08/20/15 for chest pain. ED visit on 11/11/15 for chest pain.  ED visit on 01/06/16 for chest pain.  ED visit on 02/03/16 for chest pain. ED visit on 02/04/16 for chest pain. Patient also has a  history of hyperlipidemia.   Encounter Medications:  Outpatient Encounter Prescriptions as of 02/13/2016  Medication Sig Note  . amoxicillin (AMOXIL) 500 MG capsule Take 500 mg by mouth 2 (two) times daily. Patient taking 2 capsules twice daily.   Marland Kitchen aspirin EC 81 MG tablet Take 1 tablet (81 mg total) by mouth daily.   . clarithromycin (BIAXIN) 500 MG tablet Take 500 mg by mouth 2 (two) times daily. Patient taking 2 tablets twice daily.   . furosemide (LASIX) 20 MG tablet Take 1 tablet (20 mg total) by mouth daily. Daily as needed. (Patient taking differently: Take 20 mg by mouth daily as needed for fluid. Daily as needed.)   . pantoprazole (PROTONIX) 40 MG tablet Take 1 tablet (40 mg total) by mouth daily. (Patient taking differently: Take 40 mg by mouth 2 (two) times daily. )   . polyethylene glycol powder (MIRALAX) powder Take 17 g by mouth daily. (Patient taking differently: Take 17 g by mouth daily as needed for moderate constipation. )   . predniSONE (DELTASONE) 10 MG tablet Take 91m daily x 7 days, 785mdaily x 7 days, 6059m 7 days, 76m27m7 days, 40mg58m days, 30mg 98mdays, 20mg x68mays, 10mg x 67mys. (Patient taking differently: Take 20 mg by mouth daily with breakfast. Take 80mg dai106m 7 days, 70mg dail52m7 days, 60mg x 7 d86m 76mg x 7 da60m40mg x 7 day17m0mg x 7 days33mmg x 7 days,35mg x 7 days.)82m30/2017: On day four  . senna-docusate (SENOKOT-S) 8.6-50 MG tablet Take 1 tablet by mouth 2 (two) times daily. (Patient taking differently:  Take 1 tablet by mouth daily as needed. )   . sucralfate (CARAFATE) 1 GM/10ML suspension Take 10 mLs (1 g total) by mouth 4 (four) times daily -  with meals and at bedtime. Please use three times daily for the next five days.   . colchicine 0.6 MG tablet Take 1 tablet (0.6 mg total) by mouth 2 (two) times daily. (Patient not taking: Reported on 02/13/2016)   . oxyCODONE-acetaminophen (PERCOCET) 5-325 MG tablet Take 1-2 tablets by mouth every 4  (four) hours as needed. (Patient not taking: Reported on 02/13/2016)   . traMADol (ULTRAM) 50 MG tablet Take 50 mg by mouth 3 (three) times daily as needed for moderate pain.    No facility-administered encounter medications on file as of 02/13/2016.     Functional Status:  In your present state of health, do you have any difficulty performing the following activities: 01/21/2016 01/16/2016  Hearing? N N  Vision? N N  Difficulty concentrating or making decisions? N N  Walking or climbing stairs? N N  Dressing or bathing? N N  Doing errands, shopping? - Scientist, forensic and eating ? - N  Using the Toilet? - N  In the past six months, have you accidently leaked urine? - N  Do you have problems with loss of bowel control? - N  Managing your Medications? - N  Managing your Finances? - N  Housekeeping or managing your Housekeeping? - N  Some recent data might be hidden    Fall/Depression Screening: PHQ 2/9 Scores 01/28/2016 01/16/2016 12/25/2015  PHQ - 2 Score 0 4 4  PHQ- 9 Score - 13 13   Fall Risk  01/28/2016 01/16/2016 12/25/2015  Falls in the past year? Yes Yes Yes  Number falls in past yr: 2 or more 2 or more 2 or more  Injury with Fall? Yes Yes Yes  Risk Factor Category  High Fall Risk High Fall Risk -  Risk for fall due to : History of fall(s) History of fall(s) History of fall(s)  Follow up Education provided Education provided Falls prevention discussed   [facility] CM Care Plan Problem One   Flowsheet Row Most Recent Value  Care Plan Problem One  Patient has had 3 admissions since January of 2017 for chest pain.   Role Documenting the Problem One  Care Management Telephonic Coordinator  Care Plan for Problem One  Active  [facility] Long Term Goal (31-90 days)  Patient will not have an admission as evidence by no admission within next 31 days.  [facility] Long Term Goal Start Date  01/03/16  [facility] Long Term Goal Met Date  -- [name] has not had any inpatient hospitalizations 12/17/15.]   Interventions for Problem One Long Term Goal  RNCM educated patient on signs and symptoms to report to MD prior to ED visit.  [facility] CM Short Term Goal #1 (0-30 days)  Patient will not have an admission as evidence by no admission within 21 days for pain.     [facility] CM Short Term Goal #1 Start Date  01/03/16  [facility] CM Short Term Goal #1 Met Date  02/04/16 [Patient has not had any inpatient admits since 12/17/15. ]  Interventions for Short Term Goal #1  RNCM will follow up with cardiologist regarding patient's diagnosis concerns.  Patient will follow up with primary MD regarding pain management strategies.  [facility] CM Short Term Goal #2 (0-30 days)  Patient will not have any ED visits for chest pain within the next  30 days.   [facility] CM Short Term Goal #2 Start Date  02/04/16  [facility] CM Short Term Goal #2 Met Date  -- [name] has ED visit on 02/04/16, last pain med 02/11/16.]  Interventions for Short Term Goal #2  RNCM educated patient to take pain medications as prescribed.   Educated patient to continue to follow up with cardiologist and gastroenterologist with signs and symptoms prior to ED visit.      Assessment: Received [facility] Transition of care referral on 12/10/15. Transition of care follow up ongoing and telephone screen completed. Pain management appointment scheduling pending, MD review.Patient will continue to receive ongoing care coordination for pain management.  Plan: RNCM will follow up with [place]  Directors regarding patient's gastroenterologist follow up regarding GI cocktail regimen recommendation, as related to new H. Pylori diagnosis , within 10 business days. RNCM will call patient for telephone outreach, care coordination follow up, within 10 business days if no return call. RNCM will call patient to verify if patient communicated 01/30/16  EGD biopsy results to cardiologist, and determine care coordination next steps, within 2 business weeks. RNCM will discuss case  update in difficult case discussion within 4 business weeks if appropriate.  Gerlean Cid H. Annia Friendly, BSN, [place] Management [facility] Telephonic CM Phone: [phone] Fax: [phone]

## 2016-02-13 NOTE — Telephone Encounter (Signed)
Forward to [facility] 

## 2016-02-13 NOTE — Telephone Encounter (Signed)
I have called the patient myself, by which time, he was put on the schedule to see Ward Givenshris Berge NP this afternoon at 1:30PM. We have done extensive workup recently including a L and R heart cath which showed normal coronaries, normal R sided pressure, no restrictive physiology. However she continue to have symptom. I have then discussed with Thayer Ohmhris regarding her recent workup as well. We have referred her to pain clinic twice so far. At this point, I am not sure if her symptom is cardiac in nature. Pending further reassessment.

## 2016-02-13 NOTE — Telephone Encounter (Signed)
noted 

## 2016-02-13 NOTE — Telephone Encounter (Signed)
I have talked with Thayer Ohmhris, he is ok with seeing her.

## 2016-02-13 NOTE — Progress Notes (Signed)
Office Visit    Patient Name: Alicia Wood Date of Encounter: 02/13/2016  Primary Care Provider:  Darrow Bussing, MD Primary Cardiologist:  J. Hochrein, MD   Chief Complaint    45 year old female with a history of pericarditis and persistent chest discomfort who presents for follow-up.  Past Medical History    Past Medical History:  Diagnosis Date  . Chest pain    a. Initially in setting of pericarditis in 11/2015; b. Ongoing cp despite prednisone, colchicine, ibuprofen,and narcotic therapy;  c. 12/2015 CTA Chest: No PE, stable small-mod pericardial effusion;  d. 12/2015 Cath: nl cors.  . Chronic diastolic CHF (congestive heart failure) (HCC)    a. 11/2015 Echo: EF 55-60%, grade 2 diastolic dysfxn.  Marland Kitchen GERD (gastroesophageal reflux disease)   . Headache   . Hyperlipidemia   . Hypertension    Patient states she does not have hypertension.   . Pericardial effusion    a. 11/23/2015 Echo: mod pericardial effusion, no tamponade; b. 12/05/2015 Echo: small circumferential pericardial effusion; c. 12/06/2015 Cardiac MRI: pericardial thickening w/ circumferential late gad enhancement of pericardium and epicardium-->suspicious for subacute effusive constrictive pericarditis;  d. 12/19/2015 Echo: triv pericardial effusion;  e. 01/06/2016 CT Chest: small to mod pericard eff.  . Pericarditis    a. 11/2015 Cardiac MRI; EF 57%, mild MR, triv TR, mild pericardial effusion, thickening of pericardium focally up to 8mm w/ significant circumferential late gad enhancement of pericardium and epicardium sugg of subacute effusive constrictive pericarditis;  b. 12/2015 R Heart Cath: nl right heart pressures w/o evidence of constrictive physiology.   Past Surgical History:  Procedure Laterality Date  . ABDOMINAL HYSTERECTOMY    . APPENDECTOMY    . CARDIAC CATHETERIZATION N/A 01/21/2016   Procedure: Right/Left Heart Cath and Coronary Angiography;  Surgeon: Kathleene Hazel, MD;  Location: [facility] INVASIVE CV  LAB;  Service: Cardiovascular;  Laterality: N/A;  . ESOPHAGOGASTRODUODENOSCOPY (EGD) WITH PROPOFOL N/A 01/30/2016   Procedure: ESOPHAGOGASTRODUODENOSCOPY (EGD) WITH PROPOFOL;  Surgeon: [name], MD;  Location: [facility] ENDOSCOPY;  Service: Endoscopy;  Laterality: N/A;  . PARTIAL HYSTERECTOMY    . TUBAL LIGATION      Allergies  No Known Allergies  History of Present Illness    45 year old female with the above complex past medical history. Beginning in late August 2017, she began to experience persistent chest discomfort that was worse with lying flat and deep breathing. She had been seen multiple times in the emergency department and also by primary care. Eventually, a chest CT was performed on August 31 and showed a moderate to large pericardial effusion. She was subsequently admitted to [facility] and echo was performed showing moderate effusion without tamponade physiology. She was placed on culture seen and follow-up echo showed some improvement in the effusion. She had recurrent chest discomfort and was hospitalized again September 13. Echo again showed normal LV function with a small circumferential pericardial effusion. 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 Right heart catheterization was recommended and she was treated with IV Solu-Medrol while hospitalized and subsequently in his own taper was initiated. Extensive workup was performed and included negative HIV, normal quantiferon (TB), ANA, and RF. A follow-up echo on September 27 showed only a trivial pericardial effusion.  She was last seen in clinic on October 23, at which time she reported ongoing chest discomfort that is worse with lying flat. She  then underwent right and left heart cardiac catheterization which showed normal coronary arteries and also  normal right heart pressures. There was no physiologic evidence of constrictive pericarditis. She has since remained on prednisone taper and has also been evaluated by gastroenterology with EGD and November 8. This revealed acute gastritis and subsequent biopsy was H. pylori positive. She was subsequently placed on amoxicillin and clarithromycin. Colchicine has been temporarily discontinued given the need for clarithromycin and contraindication for utilization of the 2 together. She says that during the week between her cardiac catheterization and her EGD she felt quite well with only limited chest discomfort but since her EGD, she has had return of fairly significant chest pain that is constant in nature and again worsened by lying flat, deep breathing, and also palpation over the anterior chest. She also has dyspnea on exertion and has noted mild lower extremity swelling. She has been taking Lasix 20 mg daily as this was previously prescribed when she started noting fluid related to prednisone. Since her EGD, she has had 2 additional ER visits. Chest x-ray on November 13 shows stable cardiomegaly and mild vascular congestion. Pain was treated in the ED. It was not felt that she required admission.She remains on prednisone taper and is down to 10 mg once a day. She no longer feels that this is effective.   Home Medications    Prior to Admission medications   Medication Sig Start Date End Date Taking? Authorizing Provider  amoxicillin (AMOXIL) 500 MG capsule Take 500 mg by mouth 2 (two) times daily. Patient taking 2 capsules twice daily.   Yes Historical Provider, MD  aspirin EC 81 MG tablet Take 1 tablet (81 mg total) by mouth daily. 12/21/15  Yes Little IshikawaErin E Smith, NP  clarithromycin (BIAXIN) 500 MG tablet Take 500 mg by mouth 2 (two) times daily. Patient taking 2 tablets twice daily. 02/06/16  Yes Historical Provider, MD  furosemide (LASIX) 20 MG tablet Take 1 tablet (20 mg total) by mouth daily. Daily as  needed. Patient taking differently: Take 20 mg by mouth daily as needed for fluid. Daily as needed. 01/14/16 04/13/16 Yes [name], PA  pantoprazole (PROTONIX) 40 MG tablet Take 1 tablet (40 mg total) by mouth daily. Patient taking differently: Take 40 mg by mouth 2 (two) times daily.  12/11/15  Yes Belkys A Regalado, MD  polyethylene glycol powder (MIRALAX) powder Take 17 g by mouth daily. Patient taking differently: Take 17 g by mouth daily as needed for moderate constipation.  01/06/16  Yes Azalia BilisKevin Campos, MD  predniSONE (DELTASONE) 10 MG tablet Take 40mg  daily x 7 days, 30mg  daily x 7 days, 20mg  x 7 days, 10mg  x 7 days. 02/13/16  Yes Ok Anishristopher R Thedore Pickel, NP  senna-docusate (SENOKOT-S) 8.6-50 MG tablet Take 1 tablet by mouth 2 (two) times daily. Patient taking differently: Take 1 tablet by mouth daily as needed.  12/10/15  Yes Belkys A Regalado, MD  sucralfate (CARAFATE) 1 GM/10ML suspension Take 10 mLs (1 g total) by mouth 4 (four) times daily -  with meals and at bedtime. Please use three times daily for the next five days. 02/05/16  Yes Gerhard Munchobert Lockwood, MD  traMADol (ULTRAM) 50 MG tablet Take 50 mg by mouth 3 (three) times daily as needed for moderate pain.   Yes Historical Provider, MD    Review of Systems    Ongoing pleuritic and chest wall pain as outlined above.  She has noted some dyspnea on exertion also mild ankle edema.  All other systems reviewed and are otherwise negative except as noted above.  Physical Exam    VS:  BP 124/89   Pulse 89   Ht 5\' 8"  (1.727 m)   Wt 288 lb 6.4 oz (130.8 kg)   SpO2 97%   BMI 43.85 kg/m  , BMI Body mass index is 43.85 kg/m. GEN: Well nourished, well developed, in no acute distress.  HEENT: normal.  Neck: Supple, Obese, difficult to gauge JVP. No carotid bruits, or masses. Cardiac: RRR, no murmurs, rubs, or gallops. No clubbing, cyanosis, trace bilateral lower extremity edema.  Radials/DP/PT 2+ and equal bilaterally. Upper chest wall is tender to  palpation.  Respiratory:  Respirations regular and unlabored, clear to auscultation bilaterally. GI: Soft, nontender, nondistended, BS + x 4. MS: no deformity or atrophy. Skin: warm and dry, no rash. Neuro:  Strength and sensation are intact. Psych: Flat affect.  Accessory Clinical Findings    ECG - Regular sinus rhythm, 89, no acute ST or T changes  Assessment &  Plan    1.  Pericarditis/chronic chest pain/pericardial effusion: Patient was diagnosed with pericarditis and pericardial effusion in late August/early September. Cardiac MRI in September also suggested effusive pericarditis.  With treatment and subsequent imaging, she was noted to have just a trivial effusion by echo in late September. She has been on a prednisone taper and due to ongoing chest pain, underwent catheterization in late October revealing normal coronary arteries, normal right heart pressures, and no evidence for constrictive physiology. She subsequently underwent EGD which showed acute gastritis with biopsy that was positive for H. pylori. This is currently being treated with antibiotics and in the setting of clarithromycin therapy, colchicine has been placed on hold. Over the past few weeks, her chest pain has been persistent and worse with lying flat and also position changes. There is also a component of chest wall tenderness. I've asked her to increase her prednisone back to 40 mg daily for week and then to taper down to 30 mg, 20 mg, and 10 mg a week at a time. I will also arrange for a follow-up limited echocardiogram to reevaluate pericardial effusion. Of note, chest x-ray on November 13 showed stable cardiomegaly. Depending on echo results, chest pain may ultimately be determined to be noncardiac. She has been referred to pain clinic note no appointment has been set at this point. I also think it's important to note that she is not requesting narcotics does not feel that narcotics have helped her previously. I've asked  her to resume colchicine after completing her course of antibiotics for H. pylori gastritis.   2. Gastritis with positive H. pylori: She remains on antibiotics and PPI therapy.  3. Chronic diastolic CHF: She is had mild volume overload in the setting of prednisone therapy. She has been taking Lasix 20 mg daily with improvement in edema. Renal function and electrodes were normal when evaluated November 13.  4. Disposition: Follow-up limited echocardiogram to reevaluate effusion. Follow-up in clinic in 2 weeks.   Nicolasa Duckinghristopher Kayman Snuffer, NP 02/13/2016, 2:33 PM

## 2016-02-13 NOTE — Patient Instructions (Addendum)
Medication Instructions:  RESTART- Prednisone  Labwork: None Ordered  Testing/Procedures: Your physician has requested that you have an echocardiogram. Echocardiography is a painless test that uses sound waves to create images of your heart. It provides your doctor with information about the size and shape of your heart and how well your heart's chambers and valves are working. This procedure takes approximately one hour. There are no restrictions for this procedure.  Follow-Up: Your physician recommends that you schedule a follow-up appointment in: 2 weeks with Dr Antoine PocheHochrein   Any Other Special Instructions Will Be Listed Below (If Applicable).                      Happy Thanksgiving  If you need a refill on your cardiac medications before your next appointment, please call your pharmacy.

## 2016-02-13 NOTE — Telephone Encounter (Signed)
New message  Pt call requesting to speak with Dr. Antoine PocheHochrein or PA Lisabeth DevoidMeng only. Pt did not want to discuss any further information about call. Please call back to discuss

## 2016-02-13 NOTE — Telephone Encounter (Signed)
New message    Pt verbalized that she is returning the call for rn

## 2016-02-13 NOTE — Telephone Encounter (Signed)
Pt declined to speak to anyone except for provider. Dr. Antoine PocheHochrein out of office today. [facility] to [facility] Meng for review.

## 2016-02-13 NOTE — Telephone Encounter (Signed)
Spoke with pt states that she is SOB and states that she has a little chest pressure also. I can hear that pt is SOB while talking intermittently. She is frustrated that she has been continuously since hospitalization and medication changes are not working and she states that she just cant breathe and is not able to afford co-pay most visits. I am concerned with how SOB that she sounds on the phone. She states that she is compliant with current medication and list is current.I am concerned that she will have problems over the long weekend and pt states that she does not want to go to the ER as she cannot afford it. Pt has appt Monday but I scheduled appt today to discuss with PA and hopefully this will take care of her SOB, she states that she is taking antibiotics, I do not see one on her list, also she is almost done with her prednisone and this has not helped either.

## 2016-02-13 NOTE — Telephone Encounter (Signed)
Should she keep appt today and wait until her oter scheduled appt next week with Dr Antoine PocheHochrein on 11-27?

## 2016-02-15 ENCOUNTER — Other Ambulatory Visit: Payer: Self-pay | Admitting: Nurse Practitioner

## 2016-02-15 DIAGNOSIS — I309 Acute pericarditis, unspecified: Secondary | ICD-10-CM

## 2016-02-18 ENCOUNTER — Other Ambulatory Visit: Payer: Self-pay | Admitting: *Deleted

## 2016-02-18 ENCOUNTER — Ambulatory Visit: Payer: 59 | Admitting: Cardiology

## 2016-02-18 DIAGNOSIS — I309 Acute pericarditis, unspecified: Secondary | ICD-10-CM

## 2016-02-18 NOTE — Progress Notes (Signed)
Called patient in regards to Alicia Wood' recommendations for labwork (sed rate and CRP). She voices she will get these tomorrow after her echo.  She also had question regarding disability paperwork. States she got note back from Dr. Antoine PocheHochrein which was sufficient for completion of her FMLA but that she will need further documentation submitted for her disability insurance to approve her leave.  States they are requesting description of procedures and tests she is getting as well as follow up appt recommendations while she is out of work. This information can be faed to [phone]  Aware I will route to Nya to f/u.

## 2016-02-18 NOTE — Telephone Encounter (Signed)
Alicia Wood is calling in reference to some disability papers . Please call

## 2016-02-18 NOTE — Progress Notes (Signed)
Spoke with pt, pt want appt time and date and copy of AVS be faxed to her job, faxed complete

## 2016-02-18 NOTE — Telephone Encounter (Signed)
Spoke with pt, pt want AVS and appt date and time be faxed to her job, faxed completed

## 2016-02-19 ENCOUNTER — Other Ambulatory Visit: Payer: Self-pay | Admitting: Nurse Practitioner

## 2016-02-19 ENCOUNTER — Other Ambulatory Visit: Payer: Self-pay | Admitting: *Deleted

## 2016-02-19 ENCOUNTER — Ambulatory Visit (HOSPITAL_COMMUNITY)
Admission: RE | Admit: 2016-02-19 | Discharge: 2016-02-19 | Disposition: A | Discharge status: Home / Self Care | Payer: 59 | Source: Ambulatory Visit | Attending: Nurse Practitioner | Admitting: Nurse Practitioner

## 2016-02-19 DIAGNOSIS — I319 Disease of pericardium, unspecified: Secondary | ICD-10-CM | POA: Diagnosis not present

## 2016-02-19 DIAGNOSIS — I351 Nonrheumatic aortic (valve) insufficiency: Secondary | ICD-10-CM | POA: Insufficient documentation

## 2016-02-19 DIAGNOSIS — I313 Pericardial effusion (noninflammatory): Secondary | ICD-10-CM | POA: Diagnosis not present

## 2016-02-19 DIAGNOSIS — I3139 Other pericardial effusion (noninflammatory): Secondary | ICD-10-CM

## 2016-02-19 DIAGNOSIS — I309 Acute pericarditis, unspecified: Secondary | ICD-10-CM | POA: Diagnosis not present

## 2016-02-19 NOTE — Progress Notes (Signed)
  Echocardiogram 2D Echocardiogram has been performed.  Tye SavoyCasey N Leevon Upperman 02/19/2016, 9:57 AM

## 2016-02-20 ENCOUNTER — Telehealth: Payer: Self-pay | Admitting: *Deleted

## 2016-02-20 ENCOUNTER — Other Ambulatory Visit: Payer: Self-pay | Admitting: *Deleted

## 2016-02-20 LAB — C-REACTIVE PROTEIN: CRP: 6.3 mg/L (ref <8.0)

## 2016-02-20 LAB — SEDIMENTATION RATE: Sed Rate: 18 mm/hr (ref 0–20)

## 2016-02-20 NOTE — Patient Outreach (Addendum)
[facility] Care Management  02/20/2016  Alicia Wood 04/27/1970 [id number]   Subjective: Ongoing case discussion with [facility] Medical Directors (Dr. Ronaldo Miyamotoobert Reade Jr. and Dr. Abner GreenspanBeth Hodges).  No additional recommendations at this time, patient will continue to follow up with gastroenterologist, and cardiologist.  Telephone call to patient's home number, no answer, no voicemail pick up, and unable to leave a message. Patient will receive ongoing transition of care and care management follow up. RNCM sent Epic in-basket message to [name] at Dr. Jenene SlickerHochrein's office, requesting to speak with Dr. Antoine PocheHochrein on 02/25/16 to give patient update.   Objective: Per chart review: Patient had outpatient EGD on 01/30/16. Patient had outpatient cardiac cath on 01/21/16. Patient hospitalized 12/17/15 - 12/21/15 for pericarditis and pericardial effusion. Patient hospitalized 12/05/15 - 12/10/15 for chronic diastolic congestive heart failure, pulmonary hypertension, and pericardial effusion. Patient hospitalized 11/22/15 - 11/29/15 for chest pain and pericardial effusion. ED visit on 04/24/15 for leg pain. ED visit on 08/20/15 for chest pain. ED visit on 11/11/15 for chest pain.  ED visit on 01/06/16 for chest pain.  ED visit on 02/03/16 for chest pain. ED visit on 02/04/16 for chest pain. Patient also has a history of hyperlipidemia.    Assessment: Received [facility] Transition of care referral on 12/10/15. Transition of care follow up ongoing and telephone screen completed. Pain management appointment scheduling pending, MD review.Patient will continue to receive ongoing care coordination for pain management.  Plan: RNCM will call patient for telephone outreach, care coordination follow up, within 10 business days if no return call. RNCM will call patient to verify if patient communicated 01/30/16  EGD biopsy results to cardiologist, and determine care coordination next steps,  within 2 business weeks. RNCM will discuss case update in difficult case discussion within 4 business weeks if appropriate.  Alisha Bacus H. Gardiner Barefootooper RN, BSN, CCM [facility] Care Management [facility] Telephonic CM Phone: [phone] Fax: [phone]

## 2016-02-20 NOTE — Telephone Encounter (Signed)
Spoke to patient to communicate normal labwork findings. She voiced understanding but had additional questions related to her short term disability and whether Dr. Antoine PocheHochrein wanted to return her to work full time or part time. She also had question about possibility moving return to work date. She also wanted to thank Nya for sorting out disability and FMLA paperwork yesterday. Aware I will route for followup.

## 2016-02-25 NOTE — Telephone Encounter (Signed)
Spoke with pt, she stated this was an old message, everything is taken care of

## 2016-02-27 ENCOUNTER — Other Ambulatory Visit: Payer: Self-pay | Admitting: *Deleted

## 2016-02-27 NOTE — Patient Outreach (Signed)
[facility]) Care Management  [place]  02/27/2016   Alicia Wood [birth year] [id number]  Subjective: Telephone call to patient's home number and mobile number, spoke with patient, and HIPAA verified.     States she is doing good, no more pain, no more fluid on heart, pericarditis has resolved, has completed antibiotics, and has a follow up with cardiologist on 03/13/16.   Patient verified cardiologist is aware of EGD biopsy results.   States she will be returning to work after follow up appointment.   States she never received follow up call from pain management clinic and no longer needs pain management.  She states she has expanded her spiritual support network with pastor, fasting, prayer, and  decreased her access to social media,  States she is very bloated due to steroid treatments, has gained weight,  And is  planning to start exercising, and requested exercise program information.   RNCM educated patient on the free group exercise classes that are available to [employer],  their dependents, and the location of the class schedule on [employer] [address] [employer].  Patient voiced understanding and states she will follow up and start attending the [place] classes.   Patient states she does not have any transition of care, care coordination, disease management, disease monitoring, transportation, community resource, or pharmacy needs at this time.   States she is very appreciative of follow up calls and support.     Objective: Per chart review: Patient had outpatient EGD on 01/30/16. Patient had outpatient cardiac cath on 01/21/16. Patient hospitalized 12/17/15 - 12/21/15 for pericarditis and pericardial effusion. Patient hospitalized 12/05/15 - 12/10/15 for chronic diastolic congestive heart failure, pulmonary hypertension, and pericardial effusion. Patient hospitalized 11/22/15 - 11/29/15 for chest pain and pericardial effusion. ED visit on 04/24/15 for leg pain. ED visit  on 08/20/15 for chest pain. ED visit on 11/11/15 for chest pain.  ED visit on 01/06/16 for chest pain.  ED visit on 02/03/16 for chest pain. ED visit on 02/04/16 for chest pain. Patient also has a history of hyperlipidemia.    Encounter Medications:  Outpatient Encounter Prescriptions as of 02/27/2016  Medication Sig  . amoxicillin (AMOXIL) 500 MG capsule Take 500 mg by mouth 2 (two) times daily. Patient taking 2 capsules twice daily.  Marland Kitchen aspirin EC 81 MG tablet Take 1 tablet (81 mg total) by mouth daily.  . clarithromycin (BIAXIN) 500 MG tablet Take 500 mg by mouth 2 (two) times daily. Patient taking 2 tablets twice daily.  . furosemide (LASIX) 20 MG tablet Take 1 tablet (20 mg total) by mouth daily. Daily as needed. (Patient taking differently: Take 20 mg by mouth daily as needed for fluid. Daily as needed.)  . pantoprazole (PROTONIX) 40 MG tablet Take 1 tablet (40 mg total) by mouth daily. (Patient taking differently: Take 40 mg by mouth 2 (two) times daily. )  . polyethylene glycol powder (MIRALAX) powder Take 17 g by mouth daily. (Patient taking differently: Take 17 g by mouth daily as needed for moderate constipation. )  . predniSONE (DELTASONE) 10 MG tablet Take 41m daily x 7 days, 335mdaily x 7 days, 2062m 7 days, 33m52m7 days.  . seMarland Kitchenna-docusate (SENOKOT-S) 8.6-50 MG tablet Take 1 tablet by mouth 2 (two) times daily. (Patient taking differently: Take 1 tablet by mouth daily as needed. )  . sucralfate (CARAFATE) 1 GM/10ML suspension Take 10 mLs (1 g total) by mouth 4 (four) times daily -  with meals  and at bedtime. Please use three times daily for the next five days.  . traMADol (ULTRAM) 50 MG tablet Take 50 mg by mouth 3 (three) times daily as needed for moderate pain.   No facility-administered encounter medications on file as of 02/27/2016.     Functional Status:  In your present state of health, do you have any difficulty performing the following activities: 01/21/2016  01/16/2016  Hearing? N N  Vision? N N  Difficulty concentrating or making decisions? N N  Walking or climbing stairs? N N  Dressing or bathing? N N  Doing errands, shopping? - Scientist, forensic and eating ? - N  Using the Toilet? - N  In the past six months, have you accidently leaked urine? - N  Do you have problems with loss of bowel control? - N  Managing your Medications? - N  Managing your Finances? - N  Housekeeping or managing your Housekeeping? - N  Some recent data might be hidden    Fall/Depression Screening: PHQ 2/9 Scores 01/28/2016 01/16/2016 12/25/2015  PHQ - 2 Score 0 4 4  PHQ- 9 Score - 13 13   Fall Risk  01/28/2016 01/16/2016 12/25/2015  Falls in the past year? Yes Yes Yes  Number falls in past yr: 2 or more 2 or more 2 or more  Injury with Fall? Yes Yes Yes  Risk Factor Category  High Fall Risk High Fall Risk -  Risk for fall due to : History of fall(s) History of fall(s) History of fall(s)  Follow up Education provided Education provided Falls prevention discussed    Assessment: Received [facility]MR Transition of care referral on 12/10/15. Transition of care follow up ongoing and telephone screen completed. Pain management appointment scheduling pending, MD review, and no longer needed per patient.   Patient currently has no pain management needs, transition of care follow up completed, care coordination completed, and will proceed with case closure.     Plan: RNCM will send case closure due to goals met request to [facility]  Management.      Tayia Stonesifer H. Annia Friendly, BSN, [place] Management [facility] Telephonic CM Phone: [phone] Fax: [phone]

## 2016-03-11 ENCOUNTER — Other Ambulatory Visit (HOSPITAL_COMMUNITY): Payer: 59

## 2016-03-12 NOTE — Progress Notes (Signed)
Cardiology Office Note   Date:  03/13/2016   ID:  Alicia Wood, DOB 06/06/1970, MRN [id number]  PCP:  Darrow Bussing, MD  Cardiologist:   Rollene Rotunda, MD    No chief complaint on file.     History of Present Illness: Alicia Wood is a 45 y.o. female who presents for evaluation of chest pain related to pericarditis.  She has had an extensive work up.  A chest CT was performed on August 31 and showed a moderate to large pericardial effusion. She was subsequently admitted to [facility] and echo was performed showing moderate effusion without tamponade physiology. She was placed on colchicine and follow-up echo showed some improvement in the effusion. She had recurrent chest discomfort and was hospitalized again September 13. Echo again showed normal LV function with a small circumferential pericardial effusion. 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 Right heart catheterization was recommended and she was treated with IV Solu-Medrol while hospitalized and subsequently a slow taper was initiated. Extensive workup was performed and included negative HIV, normal quantiferon (TB), ANA, and RF. A follow-up echo on September 27 showed only a trivial pericardial effusion.  She has also been evaluated by gastroenterology with EGD and November 8. This revealed acute gastritis and subsequent biopsy was H. pylori positive. She was subsequently placed on amoxicillin and clarithromycin.   .She remains on prednisone taper and is down to 10 mg once a day. She finally seems to be improving with less chest discomfort. She's not having the same kind of discomfort that she was having previously but she's gained weight. She's anxious to get back to work.  Past Medical History:  Diagnosis Date  . Chest pain    a. Initially in  setting of pericarditis in 11/2015; b. Ongoing cp despite prednisone, colchicine, ibuprofen,and narcotic therapy;  c. 12/2015 CTA Chest: No PE, stable small-mod pericardial effusion;  d. 12/2015 Cath: nl cors.  . Chronic diastolic CHF (congestive heart failure) (HCC)    a. 11/2015 Echo: EF 55-60%, grade 2 diastolic dysfxn.  Marland Kitchen GERD (gastroesophageal reflux disease)   . Headache   . Hyperlipidemia   . Hypertension    Patient states she does not have hypertension.   . Pericardial effusion    a. 11/23/2015 Echo: mod pericardial effusion, no tamponade; b. 12/05/2015 Echo: small circumferential pericardial effusion; c. 12/06/2015 Cardiac MRI: pericardial thickening w/ circumferential late gad enhancement of pericardium and epicardium-->suspicious for subacute effusive constrictive pericarditis;  d. 12/19/2015 Echo: triv pericardial effusion;  e. 01/06/2016 CT Chest: small to mod pericard eff.  . Pericarditis    a. 11/2015 Cardiac MRI; EF 57%, mild MR, triv TR, mild pericardial effusion, thickening of pericardium focally up to 8mm w/ significant circumferential late gad enhancement of pericardium and epicardium sugg of subacute effusive constrictive pericarditis;  b. 12/2015 R Heart Cath: nl right heart pressures w/o evidence of constrictive physiology.    Past Surgical History:  Procedure Laterality Date  . ABDOMINAL HYSTERECTOMY    . APPENDECTOMY    . CARDIAC CATHETERIZATION N/A 01/21/2016   Procedure: Right/Left Heart Cath and Coronary Angiography;  Surgeon: Kathleene Hazel, MD;  Location: [facility] INVASIVE CV LAB;  Service: Cardiovascular;  Laterality: N/A;  . ESOPHAGOGASTRODUODENOSCOPY (EGD) WITH PROPOFOL N/A 01/30/2016   Procedure: ESOPHAGOGASTRODUODENOSCOPY (EGD) WITH PROPOFOL;  Surgeon: [name], MD;  Location: [facility] ENDOSCOPY;  Service: Endoscopy;  Laterality: N/A;  .  PARTIAL HYSTERECTOMY    . TUBAL LIGATION       Current Outpatient Prescriptions  Medication Sig Dispense Refill  . aspirin  EC 81 MG tablet Take 1 tablet (81 mg total) by mouth daily.    . furosemide (LASIX) 20 MG tablet Take 1 tablet (20 mg total) by mouth daily. Daily as needed. (Patient taking differently: Take 20 mg by mouth daily as needed for fluid. Daily as needed.) 90 tablet 3  . pantoprazole (PROTONIX) 40 MG tablet Take 1 tablet (40 mg total) by mouth daily. (Patient taking differently: Take 40 mg by mouth 2 (two) times daily. ) 30 tablet 0  . predniSONE (DELTASONE) 10 MG tablet Take 40mg  daily x 7 days, 30mg  daily x 7 days, 20mg  x 7 days, 10mg  x 7 days. 70 tablet 0   No current facility-administered medications for this visit.     Allergies:   Patient has no known allergies.    ROS:  Please see the history of present illness.   Otherwise, review of systems are positive for a recent diarrhea.   All other systems are reviewed and negative.    PHYSICAL EXAM: VS:  BP 102/84   Pulse (!) 104   Ht 5\' 8"  (1.727 m)   Wt 288 lb (130.6 kg)   BMI 43.79 kg/m  , BMI Body mass index is 43.79 kg/m. GENERAL:  Well appearing HEENT:  Pupils equal round and reactive, fundi not visualized, oral mucosa unremarkable NECK:  No jugular venous distention, waveform within normal limits, carotid upstroke brisk and symmetric, no bruits, no thyromegaly LYMPHATICS:  No cervical, inguinal adenopathy LUNGS:  Clear to auscultation bilaterally BACK:  No CVA tenderness CHEST:  Unremarkable HEART:  PMI not displaced or sustained,S1 and S2 within normal limits, no S3, no S4, no clicks, no rubs, no murmurs ABD:  Flat, positive bowel sounds normal in frequency in pitch, no bruits, no rebound, no guarding, no midline pulsatile mass, no hepatomegaly, no splenomegaly EXT:  2 plus pulses throughout, no edema, no cyanosis no clubbing   EKG:  EKG is ordered today. The ekg ordered today demonstrates  sinus tachycardia, rate 104. For, axis within normal limits, intervals within normal limits, no Q ST-T wave changes.   Recent  Labs: 11/22/2015: TSH 0.273 02/04/2016: ALT 16; B Natriuretic Peptide 38.3; BUN 15; Creatinine, Ser 0.82; Hemoglobin 10.9; Magnesium 2.0; Platelets 289; Potassium 3.8; Sodium 139    Lipid Panel No results found for: CHOL, TRIG, HDL, CHOLHDL, VLDL, LDLCALC, LDLDIRECT    Wt Readings from Last 3 Encounters:  03/13/16 288 lb (130.6 kg)  02/13/16 288 lb 6.4 oz (130.8 kg)  02/04/16 260 lb (117.9 kg)      Other studies Reviewed: Additional studies/ records that were reviewed today include: None. Review of the above records demonstrates:  Please see elsewhere in the note.     ASSESSMENT AND PLAN:  CHRONIC PERICARDITIS:   She now has had improvement in her symptoms. Endocrine reduce her prednisone to 5 mg daily. Next step will be to reduce to 2.5 mg for approximately one month.   Steroid taper in this situation can take 6 to 12 months.  If she has recurrent symptoms we will go back up on steroids and consider triple therapy with NSAIDs, steroid and colchicine.  Alternative therapies can include Anakinra, AZT or IVIG.    Current medicines are reviewed at length with the patient today.  The patient does not have concerns regarding medicines.  The following changes have  been made:  As above  Labs/ tests ordered today include:  No orders of the defined types were placed in this encounter.   Disposition:   FU with me in one month.     Signed, Rollene RotundaJames Antinette Keough, MD  03/13/2016 11:41 AM    [facility]

## 2016-03-13 ENCOUNTER — Encounter: Payer: Self-pay | Admitting: Cardiology

## 2016-03-13 ENCOUNTER — Ambulatory Visit (INDEPENDENT_AMBULATORY_CARE_PROVIDER_SITE_OTHER): Payer: 59 | Admitting: Cardiology

## 2016-03-13 ENCOUNTER — Telehealth: Payer: Self-pay | Admitting: Cardiology

## 2016-03-13 VITALS — BP 102/84 | HR 104 | Ht 68.0 in | Wt 288.0 lb

## 2016-03-13 DIAGNOSIS — I300 Acute nonspecific idiopathic pericarditis: Secondary | ICD-10-CM | POA: Diagnosis not present

## 2016-03-13 MED ORDER — PREDNISONE 5 MG PO TABS: 5.0000 mg | ORAL_TABLET | Freq: Every day | ORAL | 2 refills | Status: DC

## 2016-03-13 NOTE — Telephone Encounter (Signed)
Work letter faxed to [employer]

## 2016-03-13 NOTE — Telephone Encounter (Signed)
She said she need her letter faxed to [employer],stating that she can go back to work. Please fax it [phone] Att: Otilio Carpenimm Richardson.

## 2016-03-13 NOTE — Patient Instructions (Signed)
Medication Instructions:  Continue current medications  Labwork: None Ordered  Testing/Procedures: None Ordered  Follow-Up: Your physician recommends that you schedule a follow-up appointment in: 1 Month   Any Other Special Instructions Will Be Listed Below (If Applicable).            HAPPY HOLIDAY   If you need a refill on your cardiac medications before your next appointment, please call your pharmacy.

## 2016-03-14 ENCOUNTER — Telehealth: Payer: Self-pay | Admitting: Cardiology

## 2016-03-14 NOTE — Telephone Encounter (Signed)
Follow Up  Patient called checking on status of [employer] Fax. Needing letter faxed to [employer] so she can return to work Fax #  [phone] Attn:Tim [employer] Corporationichardson

## 2016-03-14 NOTE — Telephone Encounter (Signed)
Duplicate message. 

## 2016-03-14 NOTE — Telephone Encounter (Signed)
Tried to notify pt mail box is full

## 2016-03-14 NOTE — Telephone Encounter (Signed)
Faxed again via Epic

## 2016-03-14 NOTE — Telephone Encounter (Signed)
Follow Up   Patient missed phone call, and returning phone call.

## 2016-03-14 NOTE — Telephone Encounter (Signed)
Pt.notified

## 2016-04-07 DIAGNOSIS — R7303 Prediabetes: Secondary | ICD-10-CM | POA: Diagnosis not present

## 2016-04-12 NOTE — Progress Notes (Deleted)
Cardiology Office Note   Date:  04/12/2016   ID:  Alicia Wood, DOB 21-May-1970, MRN [id number]  PCP:  Darrow Bussing, MD  Cardiologist:   Rollene Rotunda, MD    No chief complaint on file.     History of Present Illness: Alicia Wood is a 46 y.o. female who presents for evaluation of chest pain related to pericarditis.  She has had an extensive work up.  A chest CT was performed on November 22, 2015 and showed a moderate to large pericardial effusion. She was subsequently admitted to [facility] and echo was performed showing moderate effusion without tamponade physiology. She was placed on colchicine and follow-up echo showed some improvement in the effusion. She had recurrent chest discomfort and was hospitalized again September 13. Echo again showed normal LV function with a small circumferential pericardial effusion. 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 Right heart catheterization was recommended and she was treated with IV Solu-Medrol while hospitalized and subsequently a slow taper was initiated. Extensive workup was performed and included negative HIV, normal quantiferon (TB), ANA, and RF. A follow-up echo on September 27 showed only a trivial pericardial effusion.  She has also been evaluated by gastroenterology with EGD and November 8. This revealed acute gastritis and subsequent biopsy was H. pylori positive. She was subsequently placed on amoxicillin and clarithromycin.   .She has been on a slow prednisone taper.  ***   remains on prednisone taper and is down to 10 mg once a day. She finally seems to be improving with less chest discomfort. She's not having the same kind of discomfort that she was having previously but she's gained weight. She's anxious to get back to work.  Past Medical History:    Diagnosis Date  . Chest pain    a. Initially in setting of pericarditis in 11/2015; b. Ongoing cp despite prednisone, colchicine, ibuprofen,and narcotic therapy;  c. 12/2015 CTA Chest: No PE, stable small-mod pericardial effusion;  d. 12/2015 Cath: nl cors.  . Chronic diastolic CHF (congestive heart failure) (HCC)    a. 11/2015 Echo: EF 55-60%, grade 2 diastolic dysfxn.  Marland Kitchen GERD (gastroesophageal reflux disease)   . Headache   . Hyperlipidemia   . Hypertension    Patient states she does not have hypertension.   . Pericardial effusion    a. 11/23/2015 Echo: mod pericardial effusion, no tamponade; b. 12/05/2015 Echo: small circumferential pericardial effusion; c. 12/06/2015 Cardiac MRI: pericardial thickening w/ circumferential late gad enhancement of pericardium and epicardium-->suspicious for subacute effusive constrictive pericarditis;  d. 12/19/2015 Echo: triv pericardial effusion;  e. 01/06/2016 CT Chest: small to mod pericard eff.  . Pericarditis    a. 11/2015 Cardiac MRI; EF 57%, mild MR, triv TR, mild pericardial effusion, thickening of pericardium focally up to 8mm w/ significant circumferential late gad enhancement of pericardium and epicardium sugg of subacute effusive constrictive pericarditis;  b. 12/2015 R Heart Cath: nl right heart pressures w/o evidence of constrictive physiology.    Past Surgical History:  Procedure Laterality Date  . ABDOMINAL HYSTERECTOMY    . APPENDECTOMY    . CARDIAC CATHETERIZATION N/A 01/21/2016   Procedure: Right/Left Heart Cath and Coronary Angiography;  Surgeon: Kathleene Hazel, MD;  [facility] INVASIVE CV LAB;  Service: Cardiovascular;  Laterality: N/A;  . ESOPHAGOGASTRODUODENOSCOPY (EGD) WITH PROPOFOL N/A 01/30/2016   Procedure: ESOPHAGOGASTRODUODENOSCOPY (EGD) WITH PROPOFOL;  Surgeon:  [name], MD;  Location: [facility] ENDOSCOPY;  Service: Endoscopy;  Laterality: N/A;  . PARTIAL HYSTERECTOMY    . TUBAL LIGATION       Current Outpatient  Prescriptions  Medication Sig Dispense Refill  . aspirin EC 81 MG tablet Take 1 tablet (81 mg total) by mouth daily.    . furosemide (LASIX) 20 MG tablet Take 1 tablet (20 mg total) by mouth daily. Daily as needed. (Patient taking differently: Take 20 mg by mouth daily as needed for fluid. Daily as needed.) 90 tablet 3  . pantoprazole (PROTONIX) 40 MG tablet Take 1 tablet (40 mg total) by mouth daily. (Patient taking differently: Take 40 mg by mouth 2 (two) times daily. ) 30 tablet 0  . predniSONE (DELTASONE) 5 MG tablet Take 1 tablet (5 mg total) by mouth daily with breakfast. 30 tablet 2   No current facility-administered medications for this visit.     Allergies:   Patient has no known allergies.    ROS:  Please see the history of present illness.   Otherwise, review of systems are positive for a ***.   All other systems are reviewed and negative.    PHYSICAL EXAM: VS:  There were no vitals taken for this visit. , BMI There is no height or weight on file to calculate BMI. GENERAL:  Well appearing HEENT:  Pupils equal round and reactive, fundi not visualized, oral mucosa unremarkable NECK:  No jugular venous distention, waveform within normal limits, carotid upstroke brisk and symmetric, no bruits, no thyromegaly LYMPHATICS:  No cervical, inguinal adenopathy LUNGS:  Clear to auscultation bilaterally BACK:  No CVA tenderness CHEST:  Unremarkable HEART:  PMI not displaced or sustained,S1 and S2 within normal limits, no S3, no S4, no clicks, no rubs, no murmurs ABD:  Flat, positive bowel sounds normal in frequency in pitch, no bruits, no rebound, no guarding, no midline pulsatile mass, no hepatomegaly, no splenomegaly EXT:  2 plus pulses throughout, no edema, no cyanosis no clubbing   EKG:  EKG is ordered today. The ekg ordered today demonstrates  sinus tachycardia, rate 104. For, axis within normal limits, intervals within normal limits, no Q ST-T wave changes.   Recent  Labs: 11/22/2015: TSH 0.273 02/04/2016: ALT 16; B Natriuretic Peptide 38.3; BUN 15; Creatinine, Ser 0.82; Hemoglobin 10.9; Magnesium 2.0; Platelets 289; Potassium 3.8; Sodium 139    Lipid Panel No results found for: CHOL, TRIG, HDL, CHOLHDL, VLDL, LDLCALC, LDLDIRECT    Wt Readings from Last 3 Encounters:  03/13/16 288 lb (130.6 kg)  02/13/16 288 lb 6.4 oz (130.8 kg)  02/04/16 260 lb (117.9 kg)      Other studies Reviewed: Additional studies/ records that were reviewed today include: ***. Review of the above records demonstrates:  ***   ASSESSMENT AND PLAN:  CHRONIC PERICARDITIS:   She now has had improvement in her symptoms.  ***  Endocrine reduce her prednisone to 5 mg daily. Next step will be to reduce to 2.5 mg for approximately one month.   Steroid taper in this situation can take 6 to 12 months.  If she has recurrent symptoms we will go back up on steroids and consider triple therapy with NSAIDs, steroid and colchicine.  Alternative therapies can include Anakinra, AZT or IVIG.    Current medicines are reviewed at length with the patient today.  The patient does not have concerns regarding medicines.  The following changes have been made:  ***  Labs/ tests ordered today include:  ***  No orders of the defined types were placed in this encounter.   Disposition:   FU with me in *** month.     Signed, Rollene RotundaJames Cassidi Modesitt, MD  04/12/2016 9:27 AM    [facility]

## 2016-04-14 ENCOUNTER — Ambulatory Visit: Payer: 59 | Admitting: Cardiology

## 2016-04-14 NOTE — Addendum Note (Signed)
Addended by: Alyson InglesBROOME, MICHELLE L on: 04/14/2016 07:57 AM   Modules accepted: Orders

## 2016-05-04 NOTE — Progress Notes (Signed)
Cardiology Office Note   Date:  05/05/2016   ID:  Alicia Wood, DOB 01/06/1971, MRN [id number]  PCP:  Darrow BussingKOIRALA,DIBAS, MD  Cardiologist:   Rollene RotundaJames Vivan Vanderveer, MD    Chief Complaint  Patient presents with  . Pericarditis      History of Present Illness: Alicia Wood is a 46 y.o. female who presents for evaluation of chest pain related to pericarditis.  She has had an extensive work up.  A chest CT was performed on August 31 and showed a moderate to large pericardial effusion. She was subsequently admitted to [facility] and echo was performed showing moderate effusion without tamponade physiology. She was placed on colchicine and follow-up echo showed some improvement in the effusion. She had recurrent chest discomfort and was hospitalized again September 13. Echo again showed normal LV function with a small circumferential pericardial effusion. Cardiac MRI was performed and showed a mild pericardial effusion with significant circumferential late gadolinium enhancement of the pericardium and epicardium suggestive of subacute constrictive pericarditis. Right heart catheterization was recommended and she was treated with IV Solu-Medrol while hospitalized and subsequently a slow taper was initiated. Extensive workup was performed and included negative HIV, normal quantiferon (TB), ANA, and RF. A follow-up echo on September 27 showed only a trivial pericardial effusion.  She has also been evaluated by gastroenterology with EGD and November 8. This revealed acute gastritis and subsequent biopsy was H. pylori positive. She was subsequently placed on amoxicillin and clarithromycin.   She is now without any meds and doing well.  The patient denies any new symptoms such as chest discomfort, neck or arm discomfort. There has been no new shortness of breath, PND or orthopnea. There have been no reported palpitations, presyncope or syncope.  She is back at work.  She is happy because the [employer] won.      Past Medical History:  Diagnosis Date  . Chest pain    a. Initially in setting of pericarditis in 11/2015; b. Ongoing cp despite prednisone, colchicine, ibuprofen,and narcotic therapy;  c. 12/2015 CTA Chest: No PE, stable small-mod pericardial effusion;  d. 12/2015 Cath: nl cors.  . Chronic diastolic CHF (congestive heart failure) (HCC)    a. 11/2015 Echo: EF 55-60%, grade 2 diastolic dysfxn.  Marland Kitchen. GERD (gastroesophageal reflux disease)   . Headache   . Hyperlipidemia   . Hypertension    Patient states she does not have hypertension.   . Pericardial effusion    a. 11/23/2015 Echo: mod pericardial effusion, no tamponade; b. 12/05/2015 Echo: small circumferential pericardial effusion; c. 12/06/2015 Cardiac MRI: pericardial thickening w/ circumferential late gad enhancement of pericardium and epicardium-->suspicious for subacute effusive constrictive pericarditis;  d. 12/19/2015 Echo: triv pericardial effusion;  e. 01/06/2016 CT Chest: small to mod pericard eff.  . Pericarditis    a. 11/2015 Cardiac MRI; EF 57%, mild MR, triv TR, mild pericardial effusion, thickening of pericardium focally up to 8mm w/ significant circumferential late gad enhancement of pericardium and epicardium sugg of subacute effusive constrictive pericarditis;  b. 12/2015 R Heart Cath: nl right heart pressures w/o evidence of constrictive physiology.    Past Surgical History:  Procedure Laterality Date  . ABDOMINAL HYSTERECTOMY    . APPENDECTOMY    . CARDIAC CATHETERIZATION N/A 01/21/2016   Procedure: Right/Left Heart Cath and Coronary Angiography;  Surgeon: Kathleene Hazelhristopher D McAlhany, MD;  [facility]VAS[facility] CV LAB;  Service: Cardiovascular;  Laterality: N/A;  . ESOPHAGOGASTRODUODENOSCOPY (EGD) WITH PROPOFOL N/A 01/30/2016   Procedure: ESOPHAGOGASTRODUODENOSCOPY (EGD) WITH  PROPOFOL;  Surgeon: [name], MD;  Location: [facility] ENDOSCOPY;  Service: Endoscopy;  Laterality: N/A;  . PARTIAL HYSTERECTOMY    . TUBAL LIGATION        No current outpatient prescriptions on file.   No current facility-administered medications for this visit.     Allergies:   Patient has no known allergies.    ROS:  Please see the history of present illness.   Otherwise, review of systems are positive for joint pain and weight gain.   All other systems are reviewed and negative.    PHYSICAL EXAM: VS:  BP 115/81   Pulse 74   Ht 5' 8.75" (1.746 m)   Wt 297 lb (134.7 kg)   BMI 44.18 kg/m  , BMI Body mass index is 44.18 kg/m. GENERAL:  Well appearing NECK:  No jugular venous distention, waveform within normal limits, carotid upstroke brisk and symmetric, no bruits, no thyromegaly LUNGS:  Clear to auscultation bilaterally BACK:  No CVA tenderness CHEST:  Unremarkable HEART:  PMI not displaced or sustained,S1 and S2 within normal limits, no S3, no S4, no clicks, no rubs, no murmurs ABD:  Flat, positive bowel sounds normal in frequency in pitch, no bruits, no rebound, no guarding, no midline pulsatile mass, no hepatomegaly, no splenomegaly EXT:  2 plus pulses throughout, no edema, no cyanosis no clubbing   EKG:  EKG is not ordered today.   Recent Labs: 11/22/2015: TSH 0.273 02/04/2016: ALT 16; B Natriuretic Peptide 38.3; BUN 15; Creatinine, Ser 0.82; Hemoglobin 10.9; Magnesium 2.0; Platelets 289; Potassium 3.8; Sodium 139    Lipid Panel No results found for: CHOL, TRIG, HDL, CHOLHDL, VLDL, LDLCALC, LDLDIRECT    Wt Readings from Last 3 Encounters:  05/05/16 297 lb (134.7 kg)  03/13/16 288 lb (130.6 kg)  02/13/16 288 lb 6.4 oz (130.8 kg)      Other studies Reviewed: Additional studies/ records that were reviewed today include: None. Review of the above records demonstrates:     ASSESSMENT AND PLAN:  CHRONIC PERICARDITIS:   She now has no symptoms and things finally seemed to have resolved.  No change in therapy is planned.  No further imaging is needed.  She will come back only if needed.     Current medicines  are reviewed at length with the patient today.  The patient does not have concerns regarding medicines.  The following changes have been made:     Labs/ tests ordered today include:   None No orders of the defined types were placed in this encounter.   Disposition:   FU with me as needed  Signed, Rollene Rotunda, MD  05/05/2016 9:29 AM    [facility]

## 2016-05-05 ENCOUNTER — Ambulatory Visit (INDEPENDENT_AMBULATORY_CARE_PROVIDER_SITE_OTHER): Payer: 59 | Admitting: Cardiology

## 2016-05-05 ENCOUNTER — Encounter: Payer: Self-pay | Admitting: Cardiology

## 2016-05-05 ENCOUNTER — Other Ambulatory Visit: Payer: Self-pay | Admitting: Family Medicine

## 2016-05-05 VITALS — BP 115/81 | HR 74 | Ht 68.75 in | Wt 297.0 lb

## 2016-05-05 DIAGNOSIS — R7303 Prediabetes: Secondary | ICD-10-CM | POA: Diagnosis not present

## 2016-05-05 DIAGNOSIS — Z6841 Body Mass Index (BMI) 40.0 and over, adult: Secondary | ICD-10-CM | POA: Diagnosis not present

## 2016-05-05 DIAGNOSIS — I319 Disease of pericardium, unspecified: Secondary | ICD-10-CM | POA: Diagnosis not present

## 2016-05-05 DIAGNOSIS — Z1231 Encounter for screening mammogram for malignant neoplasm of breast: Secondary | ICD-10-CM

## 2016-05-05 DIAGNOSIS — E669 Obesity, unspecified: Secondary | ICD-10-CM | POA: Diagnosis not present

## 2016-05-05 NOTE — Patient Instructions (Signed)
Medication Instructions:  Continue current medications  Labwork: None Ordered  Testing/Procedures: None Ordered  Follow-Up: Your physician recommends that you schedule a follow-up appointment in: As Needed   Any Other Special Instructions Will Be Listed Below (If Applicable).   If you need a refill on your cardiac medications before your next appointment, please call your pharmacy.   

## 2016-05-23 ENCOUNTER — Ambulatory Visit: Payer: 59

## 2016-11-25 ENCOUNTER — Telehealth: Payer: Self-pay | Admitting: Cardiology

## 2016-11-25 NOTE — Telephone Encounter (Signed)
Left msg for patient to call. Looks like she was last seen in Feb re pericarditis and recommended f/u PRN.

## 2016-11-25 NOTE — Telephone Encounter (Signed)
New Message   pt verbalized that she is calling for rn   To speak to her about her follow up care since from being released

## 2016-11-26 NOTE — Telephone Encounter (Signed)
Spoke with pt, she is having the same symptoms as she did previously with pericarditis. She works at [employer] and they suggested they see dr hochrein to make sure nothing is going on. Follow up scheduled with dr hochrein. Patient voiced understanding to go to the ER for problems prior to appt.

## 2016-12-03 NOTE — Progress Notes (Signed)
Cardiology Office Note   Date:  12/04/2016   ID:  Alicia Wood, DOB 01/02/1971, MRN [id number]  PCP:  Darrow BussingKoirala, Dibas, MD  Cardiologist:   Rollene RotundaJames Maloree Uplinger, MD    Chief Complaint  Patient presents with  . Chest Pain      History of Present Illness: Alicia Wood is a 46 y.o. female who presents for evaluation of pericarditis.  She has had an extensive work up.  A chest CT was performed on November 22, 2015 and showed a moderate to large pericardial effusion. She was subsequently admitted to [facility] and echo was performed showing moderate effusion without tamponade physiology. She was placed on colchicine and follow-up echo showed some improvement in the effusion. She had recurrent chest discomfort and was hospitalized again September 13. Echo again showed normal LV function with a small circumferential pericardial effusion. Cardiac MRI was performed and showed a mild pericardial effusion with significant circumferential late gadolinium enhancement of the pericardium and epicardium suggestive of subacute constrictive pericarditis. Right heart catheterization was recommended and she was treated with IV Solu-Medrol while hospitalized and subsequently a slow taper was initiated. Extensive workup was performed and included negative HIV, normal quantiferon (TB), ANA, and RF. A follow-up echo on September 27 showed only a trivial pericardial effusion.  She has also been evaluated by gastroenterology with EGD and November 8. This revealed acute gastritis and subsequent biopsy was H. pylori positive. She was subsequently placed on amoxicillin and clarithromycin.   She reports that she's been getting chest pain for about 2 weeks. This happened she's not following a cold that she contracted. She describes some generalized body aches. She then developed some mid chest discomfort similar to her previous pericarditis. It is slightly worse lying flat. She notices it with deep inspiration. It has been 5 out  of 10 in intensity at its peak. She did take a few prednisone that she had and some low-dose ibuprofen but she had no resolution of symptoms. She's had a mild cough. She's been little more short of breath with activity. She's sleeping now in a recliner. She's not describing fevers or chills. Says slow weight gain but no new edema.   Past Medical History:  Diagnosis Date  . Chest pain    a. Initially in setting of pericarditis in 11/2015; b. Ongoing cp despite prednisone, colchicine, ibuprofen,and narcotic therapy;  c. 12/2015 CTA Chest: No PE, stable small-mod pericardial effusion;  d. 12/2015 Cath: nl cors.  . Chronic diastolic CHF (congestive heart failure) (HCC)    a. 11/2015 Echo: EF 55-60%, grade 2 diastolic dysfxn.  Marland Kitchen. GERD (gastroesophageal reflux disease)   . Headache   . Hyperlipidemia   . Hypertension    Patient states she does not have hypertension.   . Pericardial effusion    a. 11/23/2015 Echo: mod pericardial effusion, no tamponade; b. 12/05/2015 Echo: small circumferential pericardial effusion; c. 12/06/2015 Cardiac MRI: pericardial thickening w/ circumferential late gad enhancement of pericardium and epicardium-->suspicious for subacute effusive constrictive pericarditis;  d. 12/19/2015 Echo: triv pericardial effusion;  e. 01/06/2016 CT Chest: small to mod pericard eff.  . Pericarditis    a. 11/2015 Cardiac MRI; EF 57%, mild MR, triv TR, mild pericardial effusion, thickening of pericardium focally up to 8mm w/ significant circumferential late gad enhancement of pericardium and epicardium sugg of subacute effusive constrictive pericarditis;  b. 12/2015 R Heart Cath: nl right heart pressures w/o evidence of constrictive physiology.    Past Surgical History:  Procedure Laterality Date  .  ABDOMINAL HYSTERECTOMY    . APPENDECTOMY    . CARDIAC CATHETERIZATION N/A 01/21/2016   Procedure: Right/Left Heart Cath and Coronary Angiography;  Surgeon: Kathleene Hazel, MD;  [facility]  INVASIVE CV LAB;  Service: Cardiovascular;  Laterality: N/A;  . ESOPHAGOGASTRODUODENOSCOPY (EGD) WITH PROPOFOL N/A 01/30/2016   Procedure: ESOPHAGOGASTRODUODENOSCOPY (EGD) WITH PROPOFOL;  Surgeon: [name], MD;  Location: [facility] ENDOSCOPY;  Service: Endoscopy;  Laterality: N/A;  . PARTIAL HYSTERECTOMY    . TUBAL LIGATION       Current Outpatient Prescriptions  Medication Sig Dispense Refill  . colchicine 0.6 MG tablet Take 1 tablet (0.6 mg total) by mouth 2 (two) times daily. 60 tablet 2  . ibuprofen (ADVIL,MOTRIN) 400 MG tablet Take 1 tablet (400 mg total) by mouth every 8 (eight) hours as needed. 90 tablet 3   No current facility-administered medications for this visit.     Allergies:   Patient has no known allergies.    ROS:  As stated in the HPI and negative for all other systems..    PHYSICAL EXAM: VS:  BP 118/76   Pulse 70   Ht 5' 8.5" (1.74 m)   Wt (!) 300 lb 12.8 oz (136.4 kg)   BMI 45.07 kg/m  , BMI Body mass index is 45.07 kg/m.  GENERAL:  Well appearing NECK:  No jugular venous distention, waveform within normal limits, carotid upstroke brisk and symmetric, no bruits, no thyromegaly LUNGS:  Clear to auscultation bilaterally CHEST:  Unremarkable HEART:  PMI not displaced or sustained,S1 and S2 within normal limits, no S3, no S4, no clicks, no rubs, no murmurs ABD:  Flat, positive bowel sounds normal in frequency in pitch, no bruits, no rebound, no guarding, no midline pulsatile mass, no hepatomegaly, no splenomegaly EXT:  2 plus pulses throughout, no edema, no cyanosis no clubbing   EKG:  EKG is  ordered today. Sinus rhythm, rate 70, axis within normal limits, intervals within normal limits, early transition in lead V2, no acute ST-T wave changes.   Recent Labs: 02/04/2016: ALT 16; B Natriuretic Peptide 38.3; BUN 15; Creatinine, Ser 0.82; Hemoglobin 10.9; Magnesium 2.0; Platelets 289; Potassium 3.8; Sodium 139    Lipid Panel No results found for: CHOL,  TRIG, HDL, CHOLHDL, VLDL, LDLCALC, LDLDIRECT    Wt Readings from Last 3 Encounters:  12/04/16 (!) 300 lb 12.8 oz (136.4 kg)  05/05/16 297 lb (134.7 kg)  03/13/16 288 lb (130.6 kg)      Other studies Reviewed: Additional studies/ records that were reviewed today include: None. Review of the above records demonstrates:     ASSESSMENT AND PLAN:  CHRONIC PERICARDITIS:   The patient may well be developing recurrent symptoms of pericarditis. [Reinstituting ibuprofen 800 mg 3 times a day along with colchicine. If however in about a week she's not improved I probably will and steroids. I will check an echocardiogram. She'll come back in a couple of weeks for follow-up V no she's not improving in the meantime.  Current medicines are reviewed at length with the patient today.  The patient does not have concerns regarding medicines.  The following changes have been made:   As above.  Labs/ tests ordered today include:     Orders Placed This Encounter  Procedures  . EKG 12-Lead  . ECHOCARDIOGRAM COMPLETE    Disposition:   FU with APP in two weeks.    Signed, Rollene Rotunda, MD  12/04/2016 5:22 PM    [facility]

## 2016-12-04 ENCOUNTER — Ambulatory Visit (INDEPENDENT_AMBULATORY_CARE_PROVIDER_SITE_OTHER): Payer: 59 | Admitting: Cardiology

## 2016-12-04 ENCOUNTER — Encounter: Payer: Self-pay | Admitting: Cardiology

## 2016-12-04 VITALS — BP 118/76 | HR 70 | Ht 68.5 in | Wt 300.8 lb

## 2016-12-04 DIAGNOSIS — I319 Disease of pericardium, unspecified: Secondary | ICD-10-CM | POA: Diagnosis not present

## 2016-12-04 DIAGNOSIS — R079 Chest pain, unspecified: Secondary | ICD-10-CM | POA: Diagnosis not present

## 2016-12-04 MED ORDER — COLCHICINE 0.6 MG PO TABS: 0.6000 mg | ORAL_TABLET | Freq: Two times a day (BID) | ORAL | 2 refills | Status: DC

## 2016-12-04 MED ORDER — IBUPROFEN 400 MG PO TABS: 400.0000 mg | ORAL_TABLET | Freq: Three times a day (TID) | ORAL | 3 refills | Status: DC | PRN

## 2016-12-04 NOTE — Patient Instructions (Signed)
Medication Instructions:  Continue current medications  If you need a refill on your cardiac medications before your next appointment, please call your pharmacy.  Labwork: None Ordered   Testing/Procedures: None Ordered  Follow-Up: Your physician wants you to follow-up in: 2 weeks with [place].      Thank you for choosing [facility]!!

## 2016-12-15 ENCOUNTER — Ambulatory Visit (HOSPITAL_COMMUNITY): Payer: 59 | Attending: Cardiology

## 2016-12-15 ENCOUNTER — Other Ambulatory Visit: Payer: Self-pay

## 2016-12-15 DIAGNOSIS — I10 Essential (primary) hypertension: Secondary | ICD-10-CM | POA: Insufficient documentation

## 2016-12-15 DIAGNOSIS — I319 Disease of pericardium, unspecified: Secondary | ICD-10-CM | POA: Insufficient documentation

## 2016-12-15 DIAGNOSIS — R079 Chest pain, unspecified: Secondary | ICD-10-CM | POA: Diagnosis not present

## 2016-12-15 DIAGNOSIS — E785 Hyperlipidemia, unspecified: Secondary | ICD-10-CM | POA: Diagnosis not present

## 2016-12-15 DIAGNOSIS — E669 Obesity, unspecified: Secondary | ICD-10-CM | POA: Insufficient documentation

## 2016-12-19 ENCOUNTER — Encounter: Payer: Self-pay | Admitting: *Deleted

## 2016-12-19 ENCOUNTER — Ambulatory Visit: Payer: 59 | Admitting: Physician Assistant

## 2016-12-19 NOTE — Progress Notes (Deleted)
Cardiology Office Note    Date:  12/19/2016   ID:  Alicia Wood, DOB 1970/12/31, MRN [id number]  PCP:  Darrow Bussing, MD  Cardiologist:  Dr. [name]   No chief complaint on file.   History of Present Illness:  Alicia Wood is a 46 y.o. female with PMH of HTN, HLD, GERD, h/o pericarditis and pericardial effusion. Chest CT performed in 11/22/2015 showed moderate to large pericardial effusion. She was subsequently admitted to [facility], echo showed moderate effusion was up, not physiology. She was placed on colchicine and a follow-up echocardiogram showed some improvement in the effusion. She had recurrent chest discomfort and was hospitalized again in September. Echo again showed normal LV function with small circumferential pericardial effusion. Cardiac MRI was performed and showed small pericardial effusion was significant circumferential late gadolinium enhancement of pericardium and epicardium suggestive of subacute constrictive or carditis. Right heart cath was recommended and she was treated with IV Solu-Medrol. Extensive workup was performed including negative HIV, normal on the femoral, AAA and rheumatoid factor. Follow-up will echocardiogram in September 2017 showed only trivial pericardial effusion. He was H. pylori positive on EGD in November 2017 and was treated with amoxicillin and the clarithromycin.  Patient was last seen by Dr. [name] on 9/813/2018 was recurrent chest pain worse when laying down. 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 Echocardiogram obtained on 12/15/2016 showed EF 55-60%, grade 1 DD, trivial MR, but no obvious pericardial effusion.  No EKG unless chest pain steroid   Past Medical History:  Diagnosis Date  . Chest pain    a. Initially in setting of pericarditis in 11/2015; b. Ongoing cp despite prednisone, colchicine, ibuprofen,and narcotic therapy;  c.  12/2015 CTA Chest: No PE, stable small-mod pericardial effusion;  d. 12/2015 Cath: nl cors.  . Chronic diastolic CHF (congestive heart failure) (HCC)    a. 11/2015 Echo: EF 55-60%, grade 2 diastolic dysfxn.  Marland Kitchen GERD (gastroesophageal reflux disease)   . Headache   . Hyperlipidemia   . Hypertension    Patient states she does not have hypertension.   . Pericardial effusion    a. 11/23/2015 Echo: mod pericardial effusion, no tamponade; b. 12/05/2015 Echo: small circumferential pericardial effusion; c. 12/06/2015 Cardiac MRI: pericardial thickening w/ circumferential late gad enhancement of pericardium and epicardium-->suspicious for subacute effusive constrictive pericarditis;  d. 12/19/2015 Echo: triv pericardial effusion;  e. 01/06/2016 CT Chest: small to mod pericard eff.  . Pericarditis    a. 11/2015 Cardiac MRI; EF 57%, mild MR, triv TR, mild pericardial effusion, thickening of pericardium focally up to 8mm w/ significant circumferential late gad enhancement of pericardium and epicardium sugg of subacute effusive constrictive pericarditis;  b. 12/2015 R Heart Cath: nl right heart pressures w/o evidence of constrictive physiology.    Past Surgical History:  Procedure Laterality Date  . ABDOMINAL HYSTERECTOMY    . APPENDECTOMY    . CARDIAC CATHETERIZATION N/A 01/21/2016   Procedure: Right/Left Heart Cath and Coronary Angiography;  Surgeon: Kathleene Hazel, MD;  Location: [facility] INVASIVE CV LAB;  Service: Cardiovascular;  Laterality: N/A;  . ESOPHAGOGASTRODUODENOSCOPY (EGD) WITH PROPOFOL N/A 01/30/2016   Procedure: ESOPHAGOGASTRODUODENOSCOPY (EGD) WITH PROPOFOL;  Surgeon: [name], MD;  Location: [facility] ENDOSCOPY;  Service: Endoscopy;  Laterality: N/A;  . PARTIAL HYSTERECTOMY    . TUBAL LIGATION      Current Medications: Outpatient Medications Prior to Visit  Medication Sig Dispense Refill  .  colchicine 0.6 MG tablet Take 1 tablet (0.6 mg total) by mouth 2 (two) times daily. 60 tablet 2  .  ibuprofen (ADVIL,MOTRIN) 400 MG tablet Take 1 tablet (400 mg total) by mouth every 8 (eight) hours as needed. 90 tablet 3   No facility-administered medications prior to visit.      Allergies:   Patient has no known allergies.   Social History   Social History  . Marital status: Divorced    Spouse name: N/A  . Number of children: N/A  . Years of education: N/A   Social History Main Topics  . Smoking status: Never Smoker  . Smokeless tobacco: Never Used  . Alcohol use Yes     Comment: occasional  . Drug use: No  . Sexual activity: Not on file   Other Topics Concern  . Not on file   Social History Narrative  . No narrative on file     Family History:  The patient's ***family history includes Cancer in her mother; Hypertension in her mother.   ROS:   Please see the history of present illness.    ROS All other systems reviewed and are negative.   PHYSICAL EXAM:   VS:  There were no vitals taken for this visit.   GEN: Well nourished, well developed, in no acute distress  HEENT: normal  Neck: no JVD, carotid bruits, or masses Cardiac: ***RRR; no murmurs, rubs, or gallops,no edema  Respiratory:  clear to auscultation bilaterally, normal work of breathing GI: soft, nontender, nondistended, + BS MS: no deformity or atrophy  Skin: warm and dry, no rash Neuro:  Alert and Oriented x 3, Strength and sensation are intact Psych: euthymic mood, full affect  Wt Readings from Last 3 Encounters:  12/04/16 (!) 300 lb 12.8 oz (136.4 kg)  05/05/16 297 lb (134.7 kg)  03/13/16 288 lb (130.6 kg)      Studies/Labs Reviewed:   EKG:  EKG is*** ordered today.  The ekg ordered today demonstrates ***  Recent Labs: 02/04/2016: ALT 16; B Natriuretic Peptide 38.3; BUN 15; Creatinine, Ser 0.82; Hemoglobin 10.9; Magnesium 2.0; Platelets 289; Potassium 3.8; Sodium 139   Lipid Panel No results found for: CHOL, TRIG, HDL, CHOLHDL, VLDL, LDLCALC, LDLDIRECT  Additional studies/ records that  were reviewed today include:   Echo 12/15/2016 LV EF: 55% -   60%  Study Conclusions  - Left ventricle: The cavity size was normal. Systolic function was   normal. The estimated ejection fraction was in the range of 55%   to 60%. Wall motion was normal; there were no regional wall   motion abnormalities. Doppler parameters are consistent with   abnormal left ventricular relaxation (grade 1 diastolic   dysfunction). - Mitral valve: There was trivial regurgitation.  Impressions:  - Compared to the prior study, there has been no significant   interval change.   ASSESSMENT:    No diagnosis found.   PLAN:  In order of problems listed above:  1. ***    Medication Adjustments/Labs and Tests Ordered: Current medicines are reviewed at length with the patient today.  Concerns regarding medicines are outlined above.  Medication changes, Labs and Tests ordered today are listed in the Patient Instructions below. There are no Patient Instructions on file for this visit.   Ramond Dial, [place]  12/19/2016 8:49 AM    [facility] [address] [place], [place], [place]  [address] Phone: [phone]; Fax: [phone]

## 2017-08-17 IMAGING — DX DG CHEST 2V
2 series · 2 of 2 positions shown · non-contrast
Comparison: 12/17/2015

CLINICAL DATA: Chest pain with dizziness and lightheadedness.
Nausea, vomiting and weakness.

EXAM:
CHEST  2 VIEW

[chest pa]
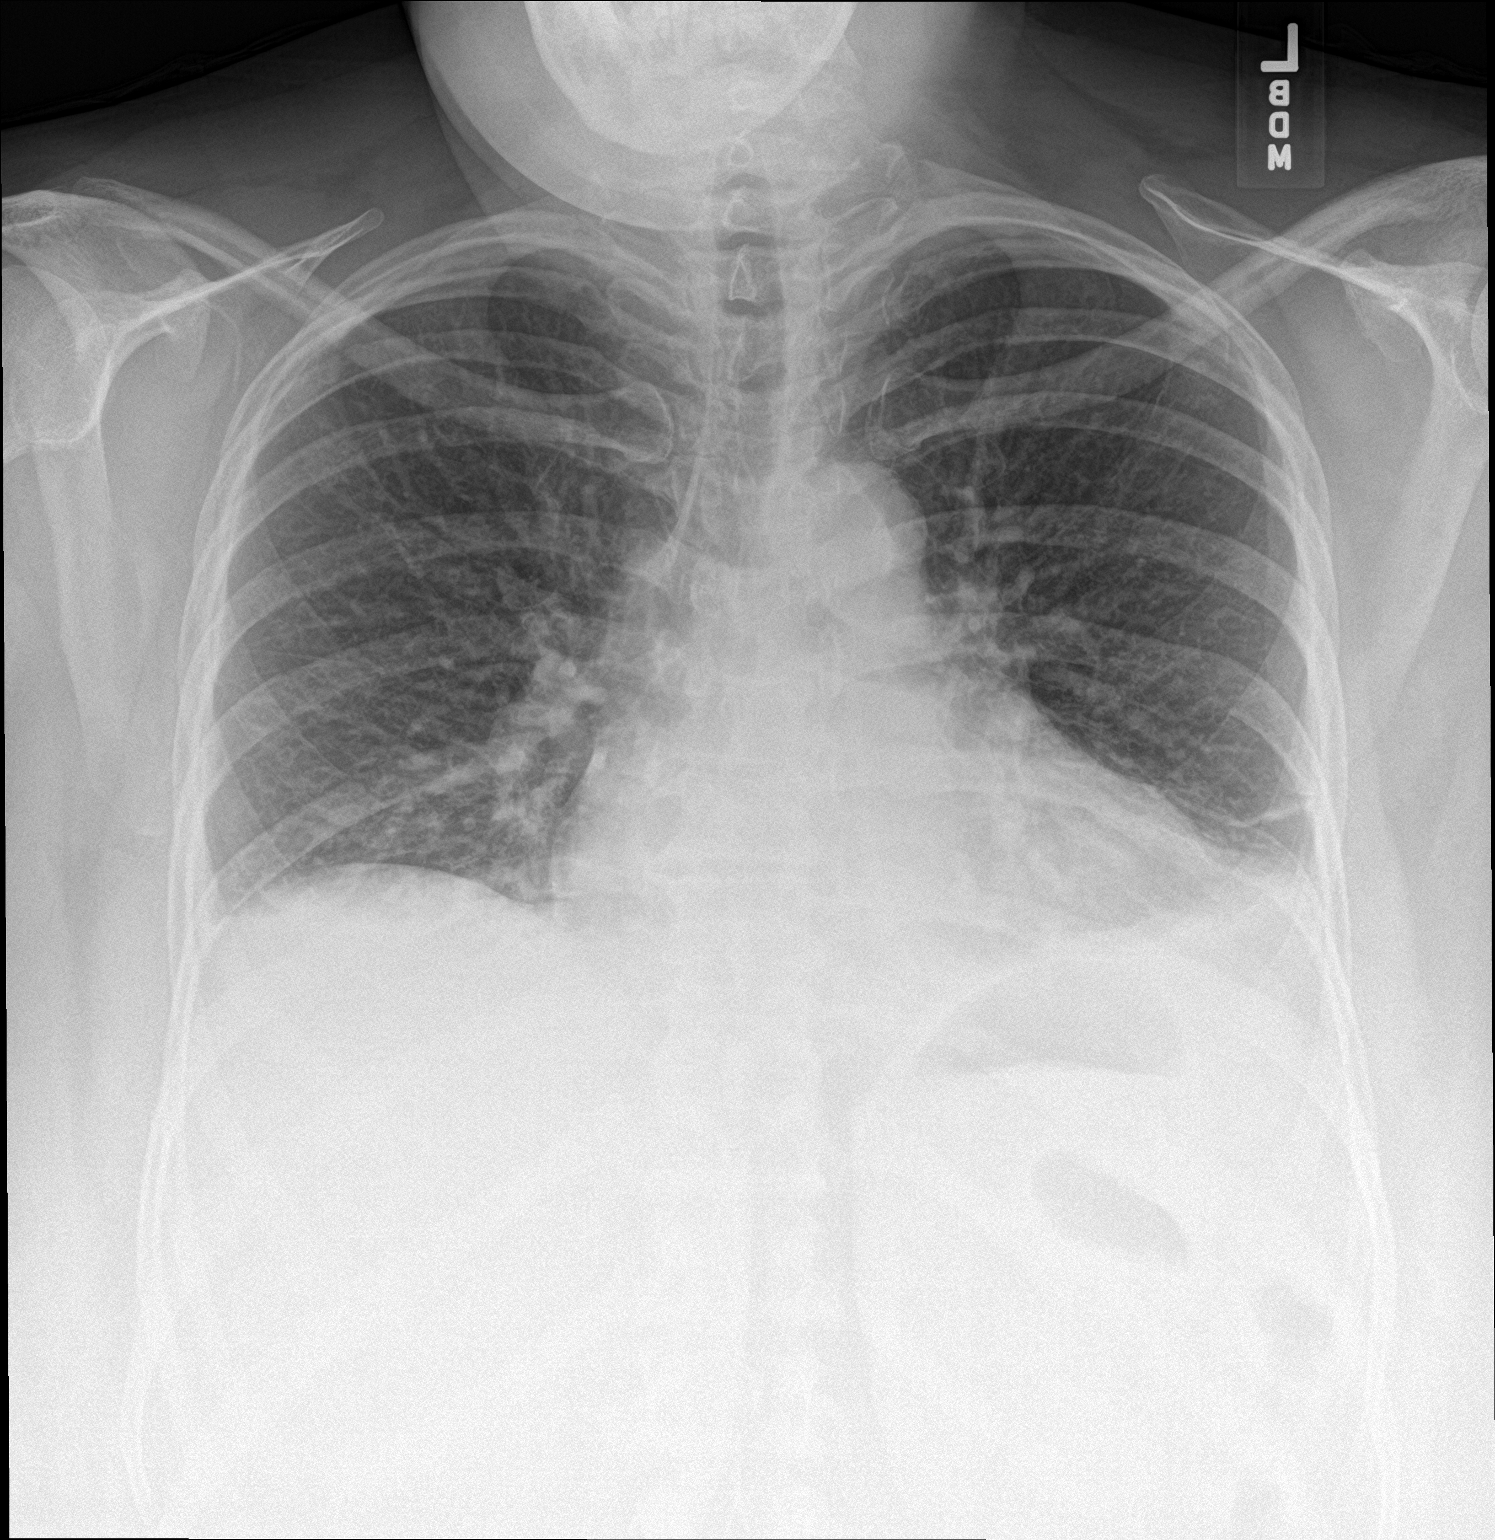

[chest lat]
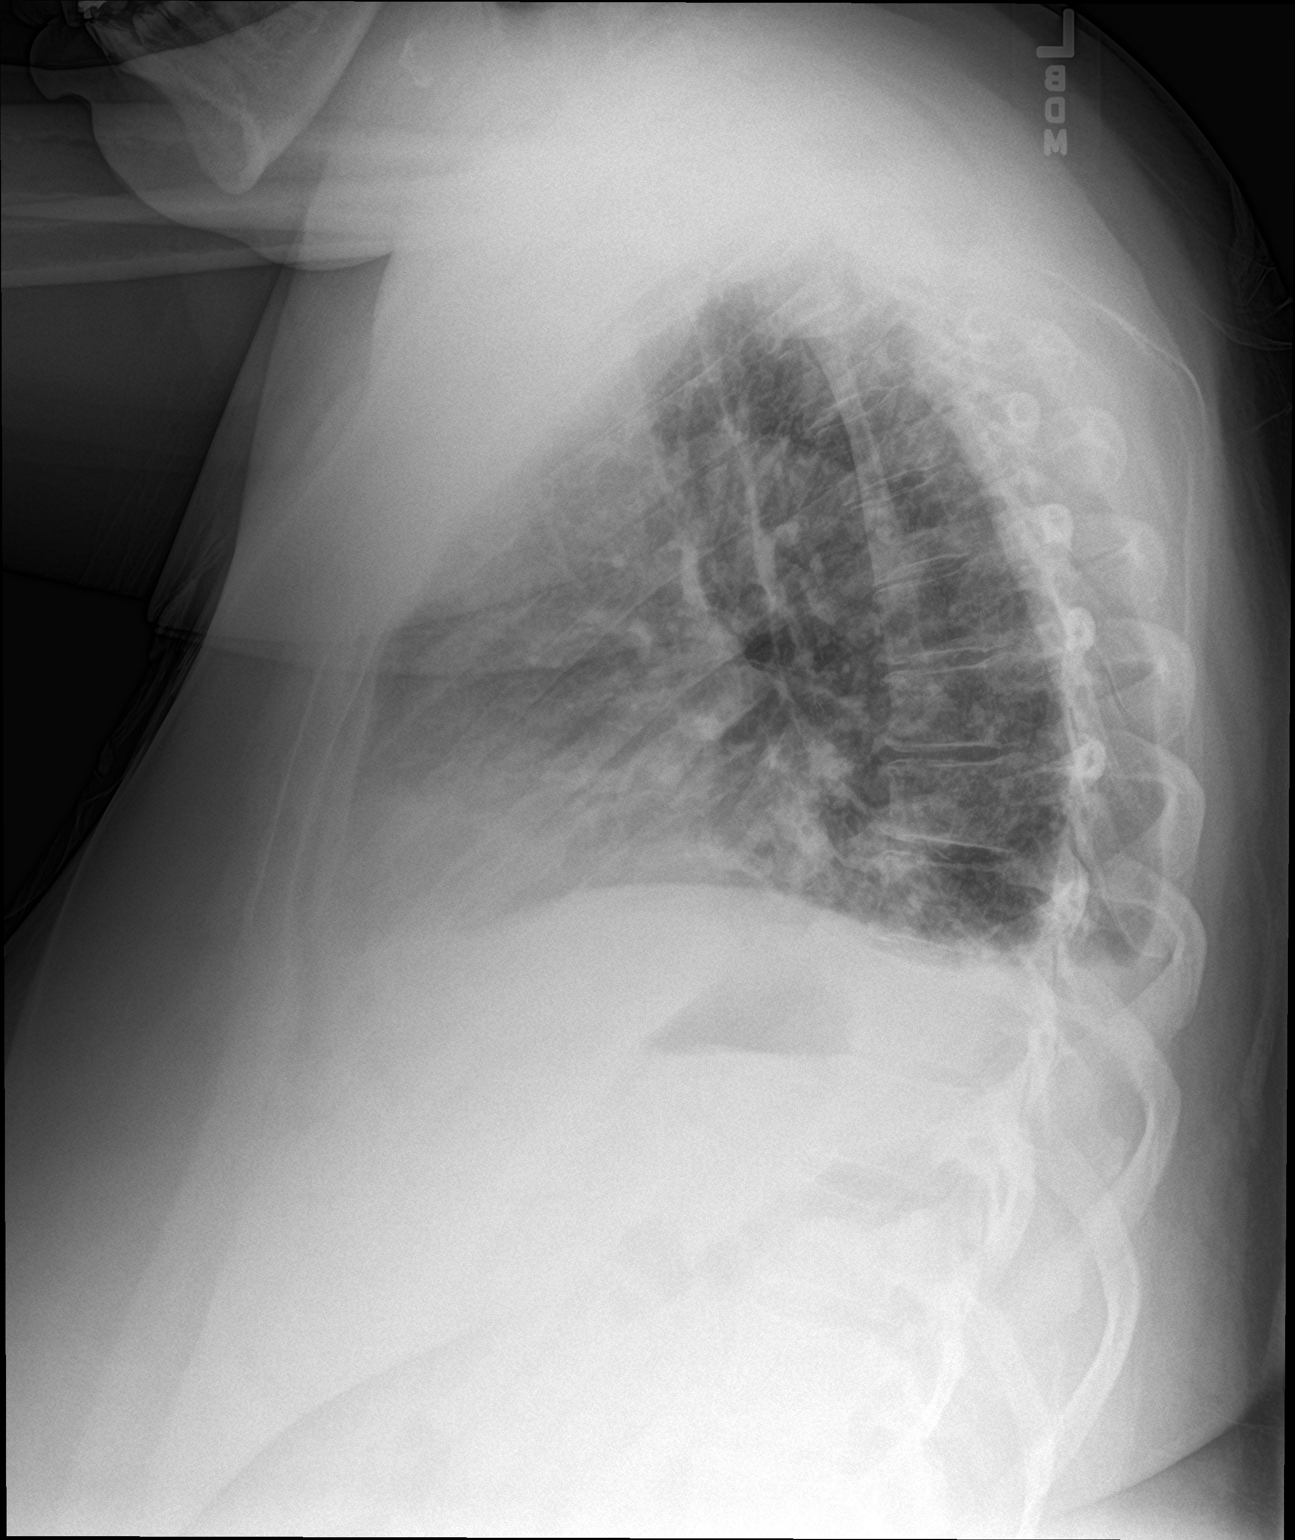

[2 of 2 positions shown; findings below may reference images not displayed]

FINDINGS: Cardiomegaly persists. Aortic atherosclerosis. Pleural effusion seen
on the left is smaller. There are small bilateral effusions today.
There is mild atelectasis at both lung bases. One could not rule out
basilar pneumonia. There is pulmonary venous hypertension without
frank edema.
IMPRESSION: Cardiomegaly and pulmonary venous hypertension. Small effusions and
basilar atelectasis. Less pleural fluid than was seen 3 weeks ago. 1
could not rule out pneumonia at the lung bases.

## 2017-09-16 IMAGING — CR DG CHEST 1V PORT
1 series · 1 of 1 positions shown · non-contrast
Comparison: 01/06/2016

CLINICAL DATA: Chest pressure for 3 days

EXAM:
PORTABLE CHEST 1 VIEW

[AP]
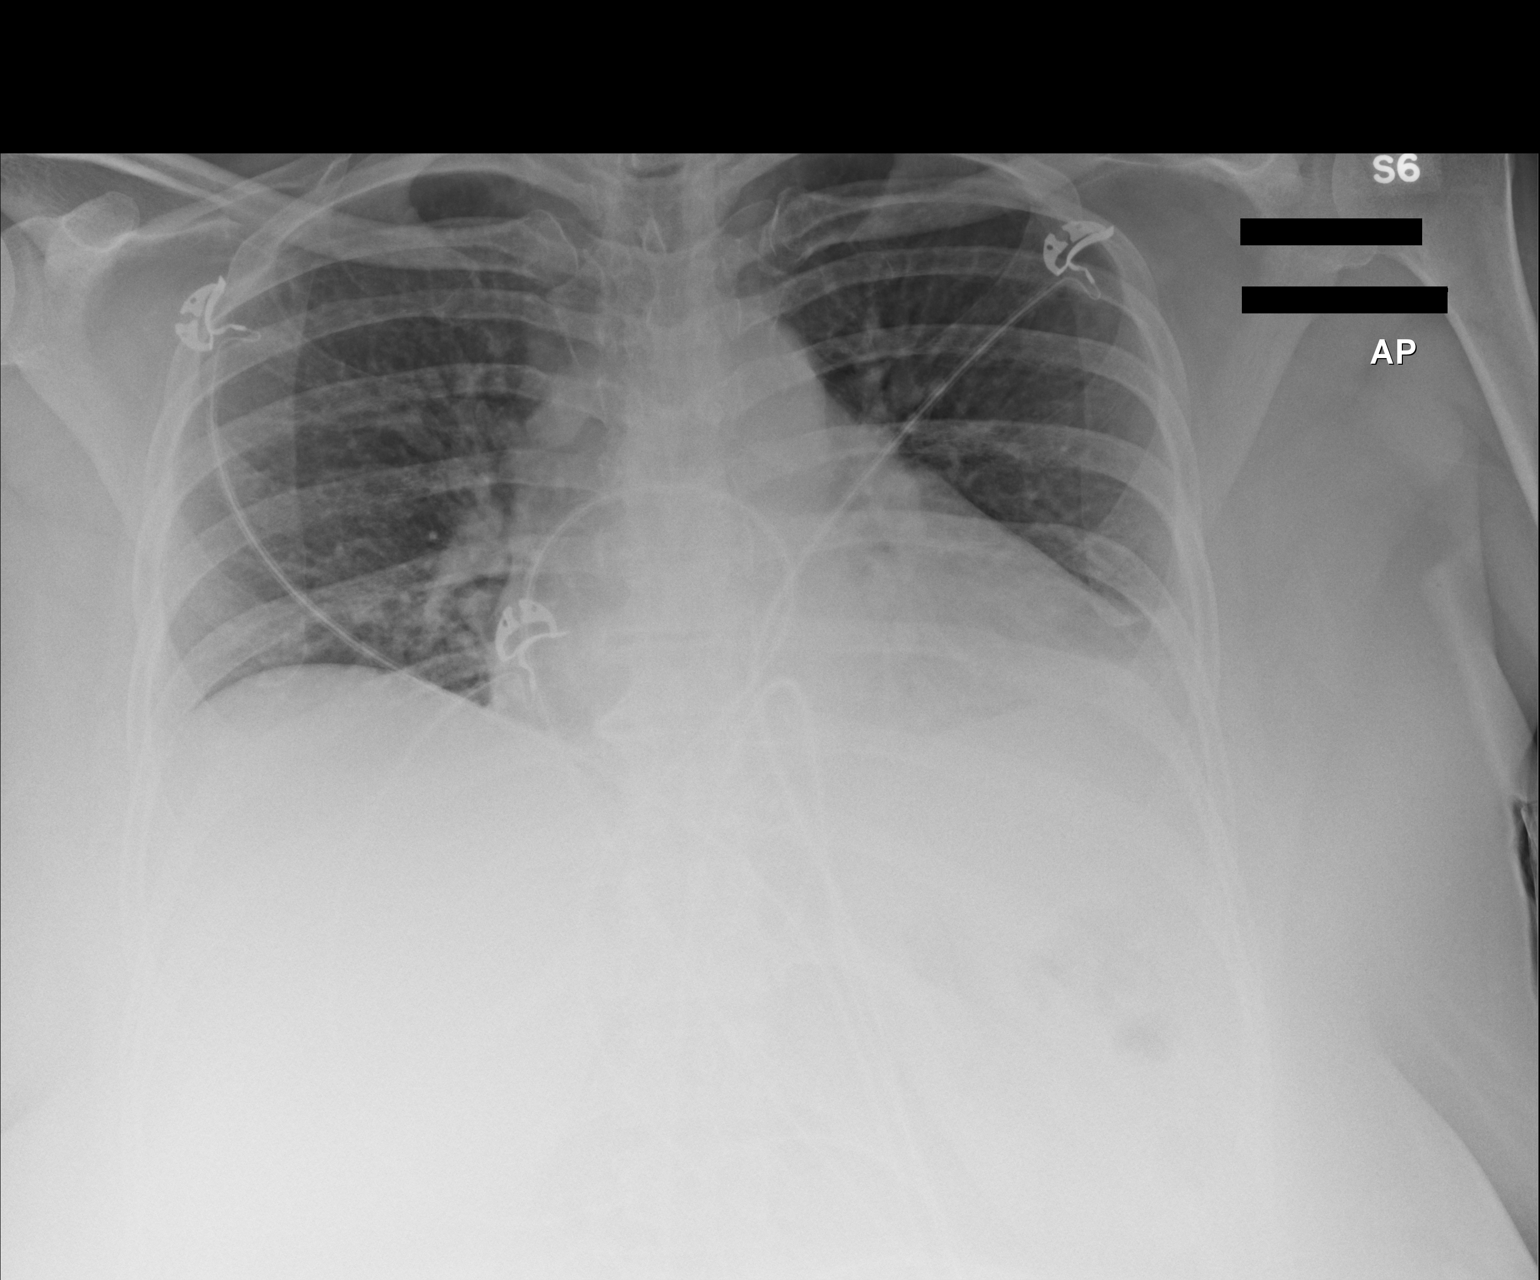

[1 of 1 positions shown; findings below may reference images not displayed]

FINDINGS: The heart size and mediastinal contours are within normal limits.
Both lungs are clear. The visualized skeletal structures are
unremarkable.
IMPRESSION: No active disease.

## 2017-09-17 IMAGING — DX DG CHEST 2V
2 series · 2 of 2 positions shown · non-contrast
Comparison: Chest CT from 01/06/2016, chest radiograph from
02/03/2016

CLINICAL DATA: Intermittent chest pain since [REDACTED]. History of
cardiac catheterization and pericarditis.

EXAM:
CHEST  2 VIEW

[w chest pa]
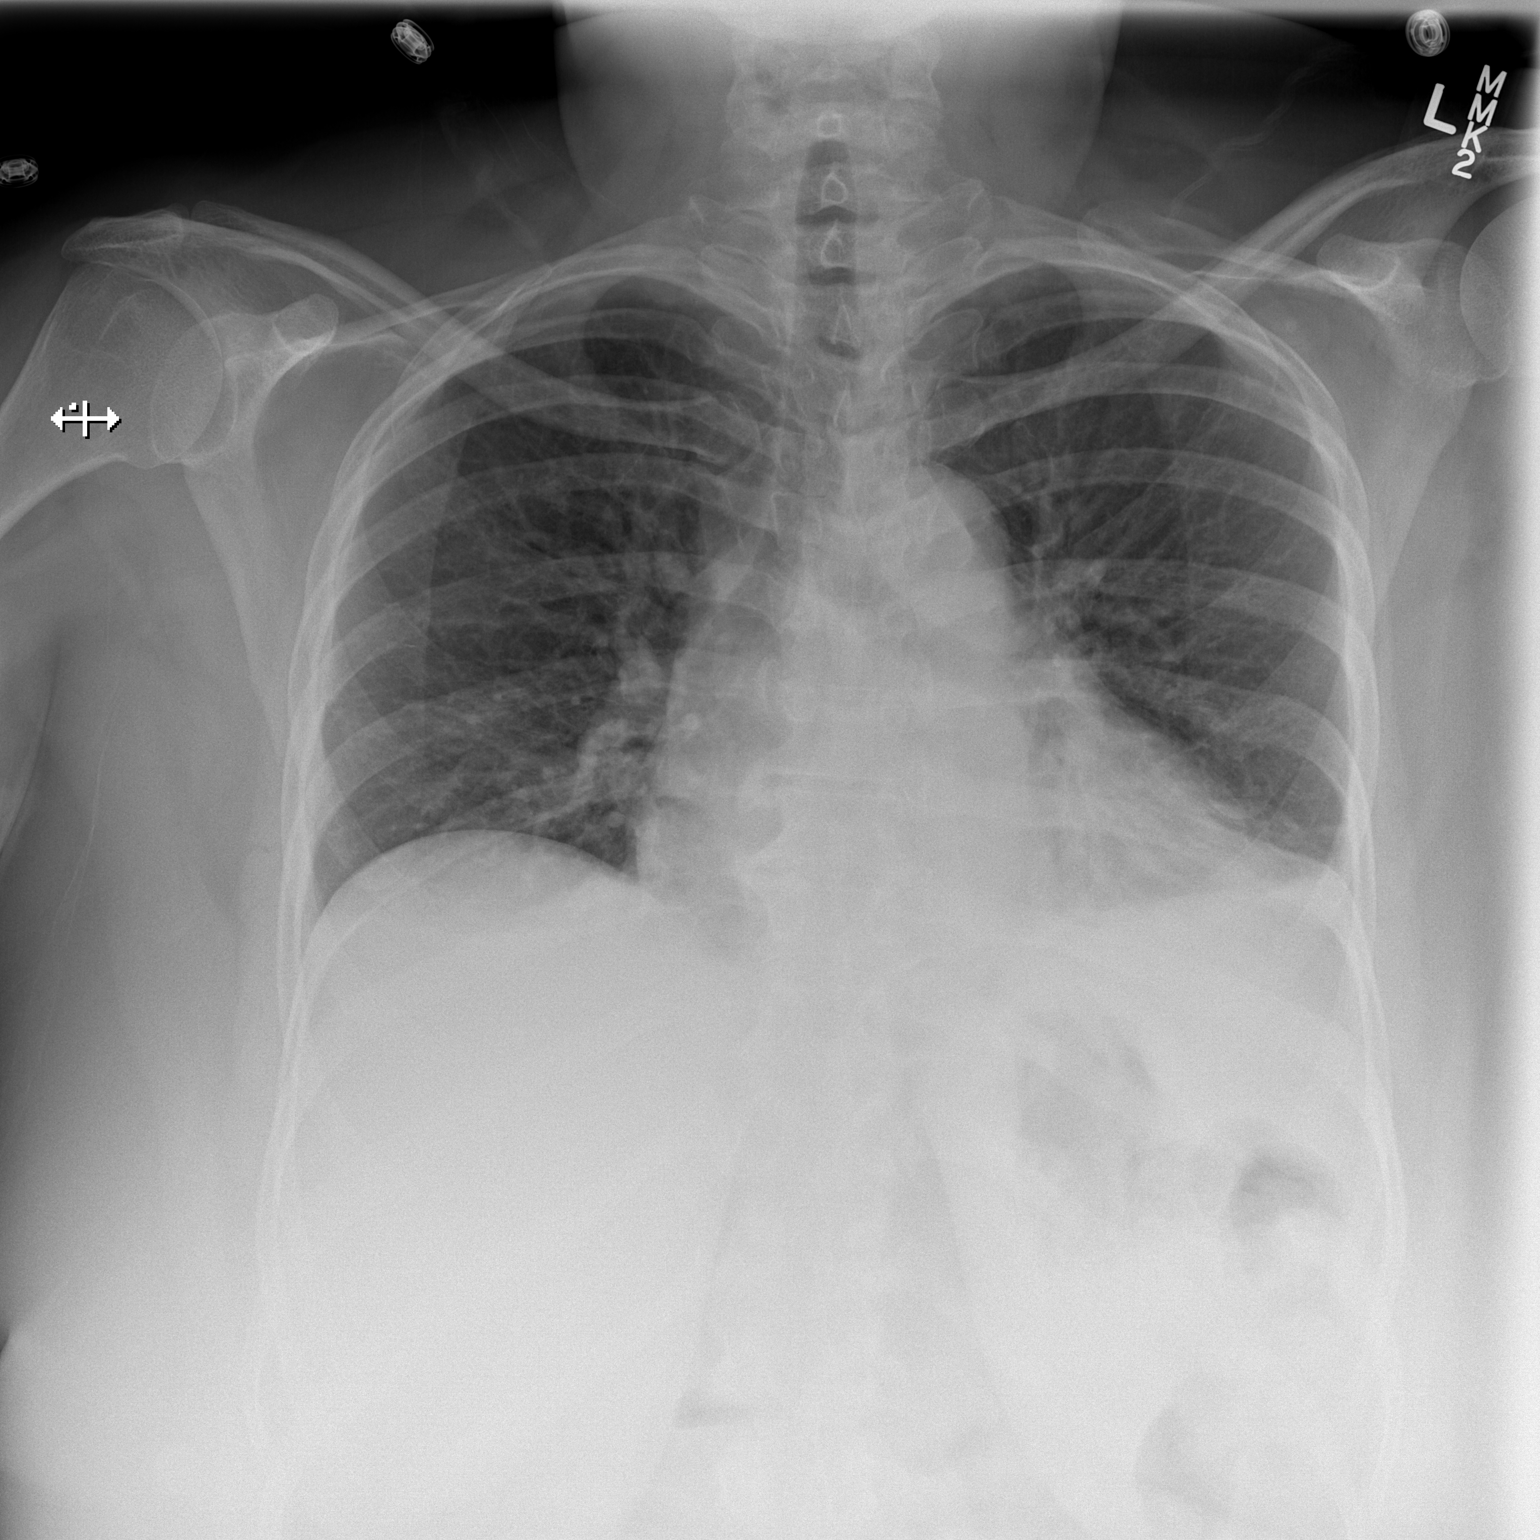

[w chest lat]
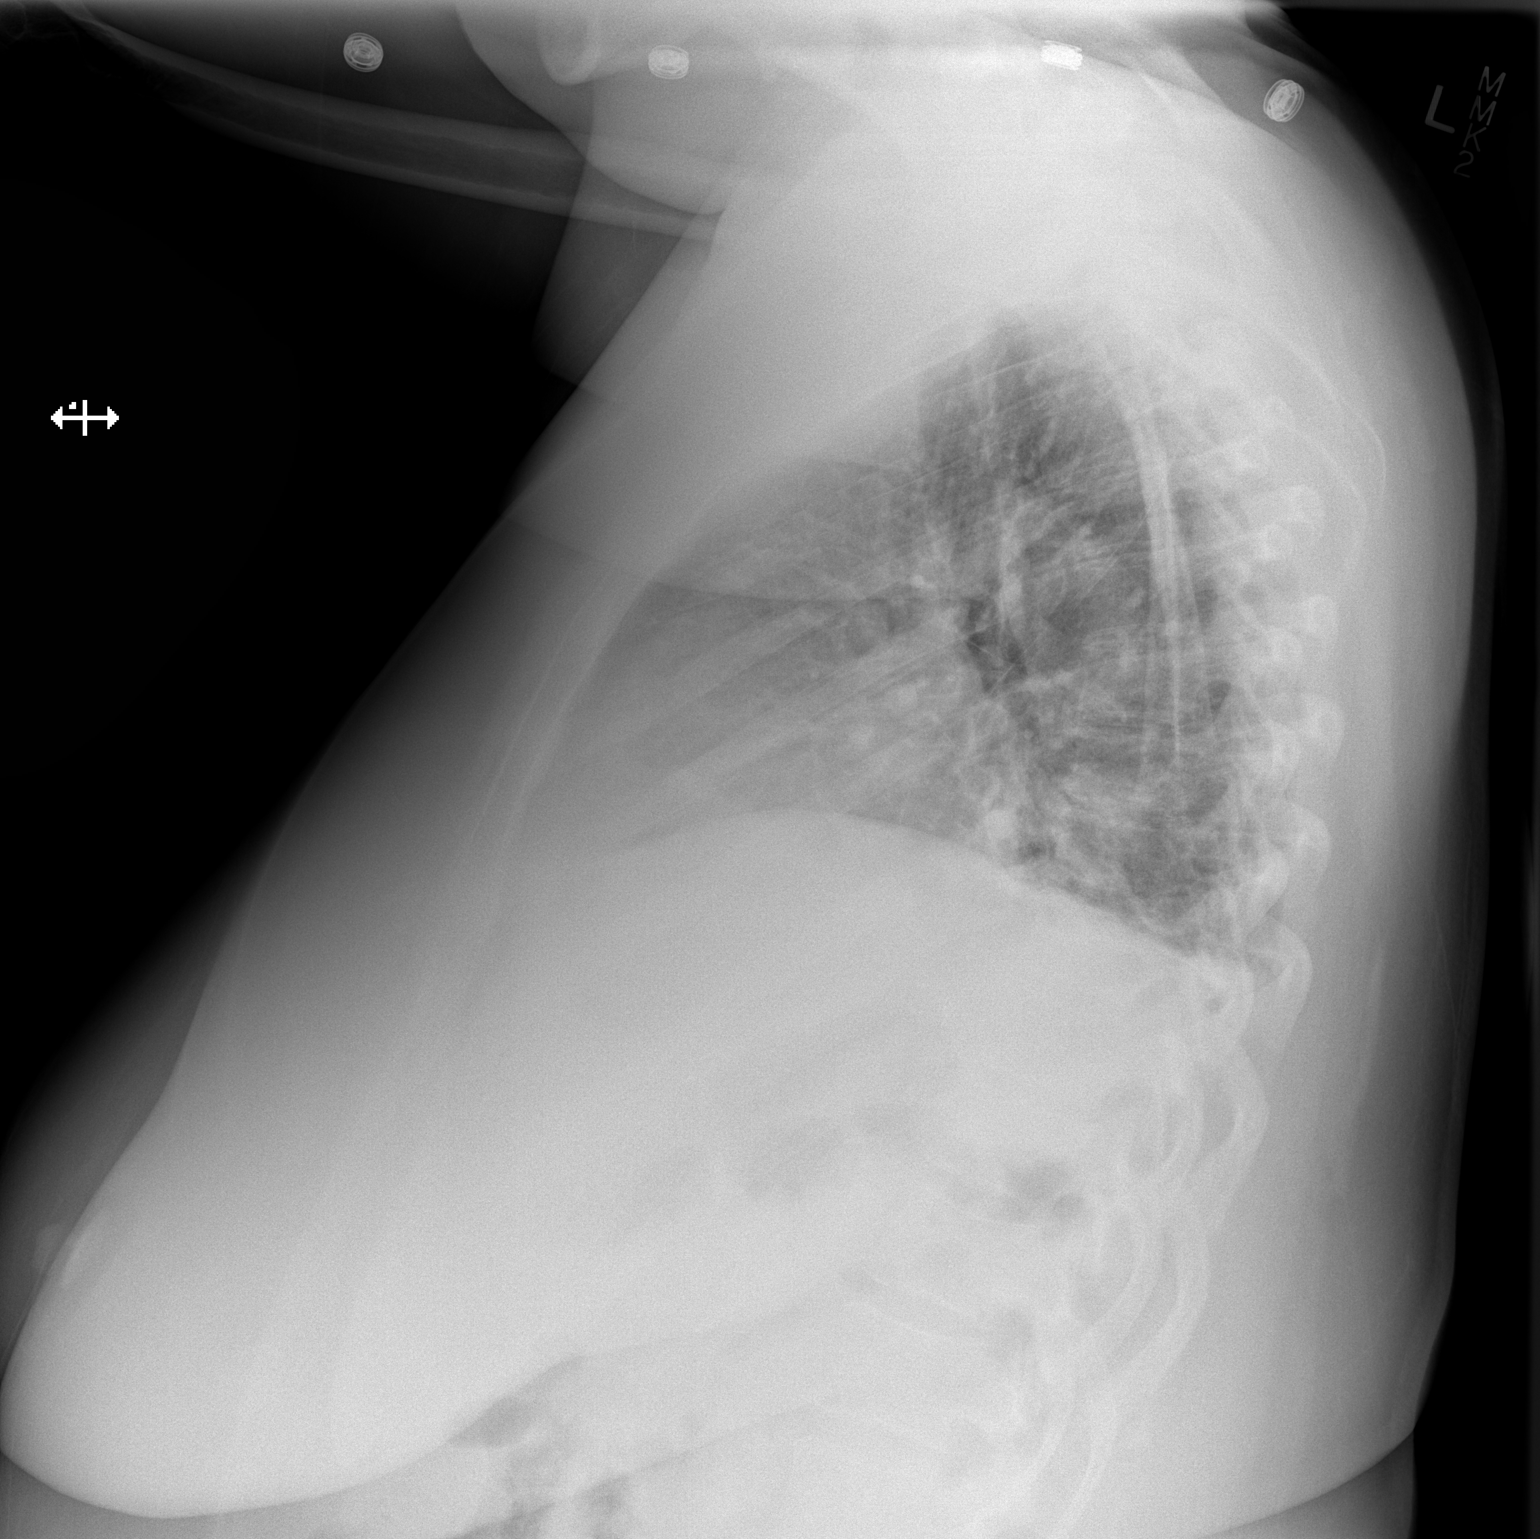

[2 of 2 positions shown; findings below may reference images not displayed]

FINDINGS: Low lung volumes with blunting of the left costophrenic angle
consistent with small left effusion. There is atelectasis and mild
vascular congestion. Mild cardiac silhouette enlargement as before.
No suspicious osseous abnormalities.
IMPRESSION: Low lung volumes with small left effusion and stable cardiomegaly.
Mild vascular congestion.

## 2018-04-11 ENCOUNTER — Ambulatory Visit (HOSPITAL_COMMUNITY)
Admission: EM | Admit: 2018-04-11 | Discharge: 2018-04-11 | Disposition: A | Discharge status: Home / Self Care | Payer: No Typology Code available for payment source | Attending: Family Medicine | Admitting: Family Medicine

## 2018-04-11 ENCOUNTER — Other Ambulatory Visit: Payer: Self-pay

## 2018-04-11 ENCOUNTER — Ambulatory Visit (INDEPENDENT_AMBULATORY_CARE_PROVIDER_SITE_OTHER): Payer: No Typology Code available for payment source

## 2018-04-11 ENCOUNTER — Encounter (HOSPITAL_COMMUNITY): Payer: Self-pay | Admitting: Emergency Medicine

## 2018-04-11 DIAGNOSIS — M1712 Unilateral primary osteoarthritis, left knee: Secondary | ICD-10-CM | POA: Insufficient documentation

## 2018-04-11 DIAGNOSIS — M25562 Pain in left knee: Secondary | ICD-10-CM | POA: Insufficient documentation

## 2018-04-11 MED ORDER — IBUPROFEN 800 MG PO TABS: 800.0000 mg | ORAL_TABLET | Freq: Three times a day (TID) | ORAL | 0 refills | Status: DC

## 2018-04-11 MED ORDER — METHYLPREDNISOLONE 4 MG PO TBPK: ORAL_TABLET | ORAL | 0 refills | Status: DC

## 2018-04-11 NOTE — ED Provider Notes (Signed)
[facility]    CSN: [id number] Arrival date & time: 04/11/18  1410     History   Chief Complaint Chief Complaint  Patient presents with  . Leg Pain  . Appointment    2:15    HPI Alicia Wood is a 48 y.o. female.   HPI Patient is here for left knee pain.  Is been bothering her for about 3 weeks.  The pain is getting worse over time.  She had no accident, no injury, no overuse.  No change in activity.  She has not had any testing or medical visits for this pain.  She has not had any x-rays previously of her knee.  No known arthritis in any of her joints.  She has not taken any medication for the pain.  She states that if she has been sitting for a period time and then stands up the pain is worse.  It is bad first thing in the morning.  No instability, buckling or locking of knee.  No crepitus or popping noise.  She has some swelling of the knee.  It hurts all around her knee, and the posterior knee, the upper calf region.  No swelling of the calf. Past Medical History:  Diagnosis Date  . Chest pain    a. Initially in setting of pericarditis in 11/2015; b. Ongoing cp despite prednisone, colchicine, ibuprofen,and narcotic therapy;  c. 12/2015 CTA Chest: No PE, stable small-mod pericardial effusion;  d. 12/2015 Cath: nl cors.  . Chronic diastolic CHF (congestive heart failure) (HCC)    a. 11/2015 Echo: EF 55-60%, grade 2 diastolic dysfxn.  Marland Kitchen GERD (gastroesophageal reflux disease)   . Headache   . Hyperlipidemia   . Hypertension    Patient states she does not have hypertension.   . Pericardial effusion    a. 11/23/2015 Echo: mod pericardial effusion, no tamponade; b. 12/05/2015 Echo: small circumferential pericardial effusion; c. 12/06/2015 Cardiac MRI: pericardial thickening w/ circumferential late gad enhancement of pericardium and epicardium-->suspicious for subacute effusive constrictive pericarditis;  d. 12/19/2015 Echo: triv pericardial effusion;  e. 01/06/2016 CT Chest:  small to mod pericard eff.  . Pericarditis    a. 11/2015 Cardiac MRI; EF 57%, mild MR, triv TR, mild pericardial effusion, thickening of pericardium focally up to 52mm w/ significant circumferential late gad enhancement of pericardium and epicardium sugg of subacute effusive constrictive pericarditis;  b. 12/2015 R Heart Cath: nl right heart pressures w/o evidence of constrictive physiology.    Patient Active Problem List   Diagnosis Date Noted  . Epigastric abdominal tenderness 01/30/2016  . Dysphagia 01/30/2016  . GERD (gastroesophageal reflux disease) 01/30/2016  . Pericarditis 12/17/2015  . Constrictive pericarditis   . Chronic diastolic CHF (congestive heart failure) (HCC)   . Pulmonary hypertension (HCC)   . Precordial pain 12/05/2015  . Hypotension 12/05/2015  . Arterial hypotension   . Pericardial effusion 11/22/2015    Past Surgical History:  Procedure Laterality Date  . ABDOMINAL HYSTERECTOMY    . APPENDECTOMY    . CARDIAC CATHETERIZATION N/A 01/21/2016   Procedure: Right/Left Heart Cath and Coronary Angiography;  Surgeon: Kathleene Hazel, MD;  Location: [facility] INVASIVE CV LAB;  Service: Cardiovascular;  Laterality: N/A;  . ESOPHAGOGASTRODUODENOSCOPY (EGD) WITH PROPOFOL N/A 01/30/2016   Procedure: ESOPHAGOGASTRODUODENOSCOPY (EGD) WITH PROPOFOL;  Surgeon: [name], MD;  Location: [facility] ENDOSCOPY;  Service: Endoscopy;  Laterality: N/A;  . PARTIAL HYSTERECTOMY    . TUBAL LIGATION      OB History   No  obstetric history on file.      Home Medications    Prior to Admission medications   Medication Sig Start Date End Date Taking? Authorizing Provider  ibuprofen (ADVIL,MOTRIN) 800 MG tablet Take 1 tablet (800 mg total) by mouth 3 (three) times daily. 04/11/18   Eustace Moore, MD  methylPREDNISolone (MEDROL DOSEPAK) 4 MG TBPK tablet tad 04/11/18   Eustace Moore, MD    Family History Family History  Problem Relation Age of Onset  . Hypertension Mother     . Cancer Mother     Social History Social History   Tobacco Use  . Smoking status: Never Smoker  . Smokeless tobacco: Never Used  Substance Use Topics  . Alcohol use: Yes    Comment: occasional  . Drug use: No     Allergies   Patient has no known allergies.   Review of Systems Review of Systems  Constitutional: Negative for chills and fever.  HENT: Negative for ear pain and sore throat.   Eyes: Negative for pain and visual disturbance.  Respiratory: Negative for cough and shortness of breath.   Cardiovascular: Negative for chest pain and palpitations.  Gastrointestinal: Negative for abdominal pain and vomiting.  Genitourinary: Negative for dysuria and hematuria.  Musculoskeletal: Positive for arthralgias and gait problem. Negative for back pain.  Skin: Negative for color change and rash.  Neurological: Negative for seizures and syncope.  All other systems reviewed and are negative.    Physical Exam Triage Vital Signs ED Triage Vitals  Enc Vitals Group     BP 04/11/18 1455 121/82     Pulse Rate 04/11/18 1455 74     Resp 04/11/18 1455 20     Temp 04/11/18 1455 98.3 F (36.8 C)     Temp Source 04/11/18 1455 Oral     SpO2 04/11/18 1455 99 %     Weight --      Height --      Head Circumference --      Peak Flow --      Pain Score 04/11/18 1453 8     Pain Loc --      Pain Edu? --      Excl. in GC? --    No data found.  Updated Vital Signs BP 121/82 (BP Location: Left Arm) Comment (BP Location): large cuff  Pulse 74   Temp 98.3 F (36.8 C) (Oral)   Resp 20   SpO2 99%       Physical Exam Constitutional:      General: She is not in acute distress.    Appearance: She is well-developed. She is obese.  HENT:     Head: Normocephalic and atraumatic.  Eyes:     Conjunctiva/sclera: Conjunctivae normal.     Pupils: Pupils are equal, round, and reactive to light.  Neck:     Musculoskeletal: Normal range of motion and neck supple.  Cardiovascular:      Rate and Rhythm: Normal rate and regular rhythm.     Heart sounds: Normal heart sounds.  Pulmonary:     Effort: Pulmonary effort is normal. No respiratory distress.     Breath sounds: Normal breath sounds.  Abdominal:     General: There is no distension.     Palpations: Abdomen is soft.  Musculoskeletal: Normal range of motion.     Comments: Limited range of motion of knee.  Patient can only flex to 80 degrees.  Lacks full extension.  Trace effusion of left knee.  Warmth.  Tenderness around the joint line.  Pain with patellofemoral grind.  Tenderness in the posterior knee joint.  Mild tenderness in the upper part of the calf, no tenderness in the deep calf, swelling, or skin changes.  Skin:    General: Skin is warm and dry.  Neurological:     General: No focal deficit present.     Mental Status: She is alert. Mental status is at baseline.  Psychiatric:        Mood and Affect: Mood normal.        Thought Content: Thought content normal.      UC Treatments / Results  Labs (all labs ordered are listed, but only abnormal results are displayed) Labs Reviewed - No data to display  EKG None  Radiology Dg Knee Ap/lat W/sunrise Left  Result Date: 04/11/2018 CLINICAL DATA:  Left knee pain.  No injury. EXAM: LEFT KNEE 3 VIEWS COMPARISON:  None. FINDINGS: Minimal tricompartmental degenerative changes. No fractures, bony lesions, or joint effusion identified. IMPRESSION: Minimal degenerative changes. Electronically Signed   By: Gerome Samavid  Williams III M.D   On: 04/11/2018 15:32    Procedures Procedures (including critical care time)  Medications Ordered in UC Medications - No data to display  Initial Impression / Assessment and Plan / UC Course  I have reviewed the triage vital signs and the nursing notes.  Pertinent labs & imaging results that were available during my care of the patient were reviewed by me and considered in my medical decision making (see chart for details).      History and physical examination is consistent with osteoarthritis of the knee with a flare.  She does have some calf pain she does not have any history or physical exam findings suspicious for DVT. Final Clinical Impressions(s) / UC Diagnoses   Final diagnoses:  Acute pain of left knee  Primary osteoarthritis of left knee     Discharge Instructions     Limit walking while knee is painful Take the medrol pak as directed.  Take all of day one today After the medrol switch to ibuprofen 3 x a day with food You  have early arthritis that is flared up right now If it persists you need to see your PCP and possibly see an orthopedic surgeon    ED Prescriptions    Medication Sig Dispense Auth. Provider   methylPREDNISolone (MEDROL DOSEPAK) 4 MG TBPK tablet tad 21 tablet Eustace MooreNelson, Palmina Clodfelter Sue, MD   ibuprofen (ADVIL,MOTRIN) 800 MG tablet Take 1 tablet (800 mg total) by mouth 3 (three) times daily. 21 tablet Eustace MooreNelson, Carolanne Mercier Sue, MD     Controlled Substance Prescriptions [place] Controlled Substance Registry consulted? Not Applicable   Eustace MooreNelson, Yuridiana Formanek Sue, MD 04/11/18 2038

## 2018-04-11 NOTE — Discharge Instructions (Signed)
Limit walking while knee is painful Take the medrol pak as directed.  Take all of day one today After the medrol switch to ibuprofen 3 x a day with food You  have early arthritis that is flared up right now If it persists you need to see your PCP and possibly see an orthopedic surgeon

## 2018-04-11 NOTE — ED Triage Notes (Signed)
Pain in left leg for 3 weeks.  Denies injury.  Reports left calf of leg as tight and painful, touches area around left knee as painful.  No history of this pain

## 2018-07-13 ENCOUNTER — Ambulatory Visit (HOSPITAL_COMMUNITY)
Admission: EM | Admit: 2018-07-13 | Discharge: 2018-07-13 | Disposition: A | Discharge status: Home / Self Care | Payer: No Typology Code available for payment source | Attending: Family Medicine | Admitting: Family Medicine

## 2018-07-13 ENCOUNTER — Encounter (HOSPITAL_COMMUNITY): Payer: Self-pay

## 2018-07-13 ENCOUNTER — Other Ambulatory Visit: Payer: Self-pay

## 2018-07-13 DIAGNOSIS — N898 Other specified noninflammatory disorders of vagina: Secondary | ICD-10-CM | POA: Diagnosis present

## 2018-07-13 DIAGNOSIS — M1712 Unilateral primary osteoarthritis, left knee: Secondary | ICD-10-CM | POA: Diagnosis not present

## 2018-07-13 DIAGNOSIS — Z711 Person with feared health complaint in whom no diagnosis is made: Secondary | ICD-10-CM

## 2018-07-13 MED ORDER — CEFTRIAXONE SODIUM 250 MG IJ SOLR
250.0000 mg | Freq: Once | INTRAMUSCULAR | Status: AC
  Administered 2018-07-13: 250 mg via INTRAMUSCULAR

## 2018-07-13 MED ORDER — METRONIDAZOLE 500 MG PO TABS: 500.0000 mg | ORAL_TABLET | Freq: Two times a day (BID) | ORAL | 0 refills | Status: DC

## 2018-07-13 MED ORDER — LIDOCAINE HCL (PF) 1 % IJ SOLN
INTRAMUSCULAR | Status: AC
  Filled 2018-07-13: qty 2

## 2018-07-13 MED ORDER — AZITHROMYCIN 250 MG PO TABS
1000.0000 mg | ORAL_TABLET | Freq: Once | ORAL | Status: AC
  Administered 2018-07-13: 12:00:00 1000 mg via ORAL

## 2018-07-13 MED ORDER — NAPROXEN 500 MG PO TABS: 500.0000 mg | ORAL_TABLET | Freq: Two times a day (BID) | ORAL | 0 refills | Status: DC

## 2018-07-13 MED ORDER — AZITHROMYCIN 250 MG PO TABS
ORAL_TABLET | ORAL | Status: AC
  Filled 2018-07-13: qty 4

## 2018-07-13 MED ORDER — CEFTRIAXONE SODIUM 250 MG IJ SOLR
INTRAMUSCULAR | Status: AC
  Filled 2018-07-13: qty 250

## 2018-07-13 NOTE — ED Provider Notes (Signed)
[facility]   [id number] 07/13/18 Arrival Time: 1112  CC: left knee pain and vaginal discharge  SUBJECTIVE: History from: patient. Alicia Wood is a 48 y.o. female hx significant for HLD, HTN, GERD, chronic diastolic CHF, and hx of pericarditis, complains of acute on chronic left knee pain that began 3 days ago.  Denies a precipitating event or specific injury.  Localizes the pain to the front and inside of left knee.  Describes the pain as constant and sharp in character.  Has tried OTC medications without relief.  Symptoms are made worse with standing from a seated position and sitting for long periods of time.  Reports similar symptoms in the past and treated with medrol dosepak with relief.  Complains of intermittent swelling.  Denies fever, chills, erythema, ecchymosis, weakness, numbness and tingling.      Patient also complains of intermittent thin vaginal discharge for the past 4 months.  Symptoms began after finishing medrol dose pak for left knee pain.  Is sexually active, has low suspicion for STDs.  Last unprotected sex 1 month ago.  Has tried multiple OTC medications/ treatments without relief.  Denies similar symptoms in the past.  Denies nausea, vomiting, abdominal or pelvic pain, vaginal odor, vaginal itching, changes in bowel or bladder habits.  ROS: As per HPI.  Past Medical History:  Diagnosis Date  . Chest pain    a. Initially in setting of pericarditis in 11/2015; b. Ongoing cp despite prednisone, colchicine, ibuprofen,and narcotic therapy;  c. 12/2015 CTA Chest: No PE, stable small-mod pericardial effusion;  d. 12/2015 Cath: nl cors.  . Chronic diastolic CHF (congestive heart failure) (HCC)    a. 11/2015 Echo: EF 55-60%, grade 2 diastolic dysfxn.  Marland Kitchen GERD (gastroesophageal reflux disease)   . Headache   . Hyperlipidemia   . Hypertension    Patient states she does not have hypertension.   . Pericardial effusion    a. 11/23/2015 Echo: mod pericardial effusion,  no tamponade; b. 12/05/2015 Echo: small circumferential pericardial effusion; c. 12/06/2015 Cardiac MRI: pericardial thickening w/ circumferential late gad enhancement of pericardium and epicardium-->suspicious for subacute effusive constrictive pericarditis;  d. 12/19/2015 Echo: triv pericardial effusion;  e. 01/06/2016 CT Chest: small to mod pericard eff.  . Pericarditis    a. 11/2015 Cardiac MRI; EF 57%, mild MR, triv TR, mild pericardial effusion, thickening of pericardium focally up to 38mm w/ significant circumferential late gad enhancement of pericardium and epicardium sugg of subacute effusive constrictive pericarditis;  b. 12/2015 R Heart Cath: nl right heart pressures w/o evidence of constrictive physiology.   Past Surgical History:  Procedure Laterality Date  . ABDOMINAL HYSTERECTOMY    . APPENDECTOMY    . CARDIAC CATHETERIZATION N/A 01/21/2016   Procedure: Right/Left Heart Cath and Coronary Angiography;  Surgeon: Kathleene Hazel, MD;  Location: [facility] INVASIVE CV LAB;  Service: Cardiovascular;  Laterality: N/A;  . ESOPHAGOGASTRODUODENOSCOPY (EGD) WITH PROPOFOL N/A 01/30/2016   Procedure: ESOPHAGOGASTRODUODENOSCOPY (EGD) WITH PROPOFOL;  Surgeon: [name], MD;  Location: [facility] ENDOSCOPY;  Service: Endoscopy;  Laterality: N/A;  . PARTIAL HYSTERECTOMY    . TUBAL LIGATION     No Known Allergies No current facility-administered medications on file prior to encounter.    No current outpatient medications on file prior to encounter.   Social History   Socioeconomic History  . Marital status: Divorced    Spouse name: Not on file  . Number of children: Not on file  . Years of education: Not on file  .  Highest education level: Not on file  Occupational History  . Not on file  Social Needs  . Financial resource strain: Not on file  . Food insecurity:    Worry: Not on file    Inability: Not on file  . Transportation needs:    Medical: Not on file    Non-medical: Not on file   Tobacco Use  . Smoking status: Never Smoker  . Smokeless tobacco: Never Used  Substance and Sexual Activity  . Alcohol use: Yes    Comment: occasional  . Drug use: No  . Sexual activity: Not on file  Lifestyle  . Physical activity:    Days per week: Not on file    Minutes per session: Not on file  . Stress: Not on file  Relationships  . Social connections:    Talks on phone: Not on file    Gets together: Not on file    Attends religious service: Not on file    Active member of club or organization: Not on file    Attends meetings of clubs or organizations: Not on file    Relationship status: Not on file  . Intimate partner violence:    Fear of current or ex partner: Not on file    Emotionally abused: Not on file    Physically abused: Not on file    Forced sexual activity: Not on file  Other Topics Concern  . Not on file  Social History Narrative  . Not on file   Family History  Problem Relation Age of Onset  . Hypertension Mother   . Cancer Mother     OBJECTIVE:  Vitals:   07/13/18 1127  BP: 91/71  Pulse: 75  Resp: 20  Temp: 98.3 F (36.8 C)  SpO2: 97%    General appearance: Alert; in no acute distress.  Head: NCAT Lungs: CTA bilaterally Heart: RRR.  Clear S1 and S2 without murmur, gallops, or rubs.  Radial pulses 2+ bilaterally. Musculoskeletal: Left knee Inspection: Skin warm, dry, clear and intact without obvious erythema, or ecchymosis. Mild swelling over anterior medial knee Palpation: Diffusely TTP over anteromedial aspect and along medial joint line ROM: LROM Strength:  5/5 knee abduction, 5/5 knee adduction, 5/5 knee flexion, 5/5 knee extension, 5/5 dorsiflexion, 5/5 plantar flexion Stability: Anterior/ posterior drawer intact Abdomen: soft, nondistended, normal active bowel sounds; nontender to palpation Back: no CVA tenderness GU: Deferred; vaginal self swab obtained Skin: warm and dry Neurologic: Ambulates without difficulty; Sensation intact  about the lower extremities Psychological: alert and cooperative; normal mood and affect  ASSESSMENT & PLAN:  1. Primary osteoarthritis of left knee   2. Vaginal discharge   3. Concern about STD in female without diagnosis    Meds ordered this encounter  Medications  . naproxen (NAPROSYN) 500 MG tablet    Sig: Take 1 tablet (500 mg total) by mouth 2 (two) times daily.    Dispense:  20 tablet    Refill:  0    Order Specific Question:   Supervising Provider    Answer:   Eustace MooreNELSON, YVONNE SUE [[id number]]  . metroNIDAZOLE (FLAGYL) 500 MG tablet    Sig: Take 1 tablet (500 mg total) by mouth 2 (two) times daily.    Dispense:  14 tablet    Refill:  0    Order Specific Question:   Supervising Provider    Answer:   Eustace MooreNELSON, YVONNE SUE [[id number]]  . cefTRIAXone (ROCEPHIN) injection 250 mg    Order Specific Question:  Antibiotic Indication:    Answer:   STD  . azithromycin (ZITHROMAX) tablet 1,000 mg   Knee pain: Knee brace given Continue conservative management of rest, ice, elevation, and gentle stretches Take naproxen as needed for pain relief (may cause abdominal discomfort, ulcers, and GI bleeds avoid taking with other NSAIDs) Follow up with orthopedist for further evaluation and management of chronic knee pain.  I have included contact information for Dr. Amanda Pea as well as [facility].   Return or go to the ER if you have any new or worsening symptoms (fever, chills, chest pain, abdominal pain, changes in bowel or bladder habits, pain radiating into lower legs, numbness, tingling, calf pain, etc...)   Vaginal discharge: Given rocephin  injection and azithromycin 1g in office Vaginal self swab obtained HIV/ syphilis testing today Prescribed metronidazole 500 mg twice daily for 7 days (do not take while consuming alcohol) Take medications as prescribed and to completion We will follow up with you regarding the results of your test If tests are positive, please abstain  from sexual activity for at least 7 days and notify partners Follow up with PCP if symptoms persists Return here or go to ER if you have any new or worsening symptoms nausea, vomiting, abdominal or pelvic pain, vaginal odor, vaginal itching, changes in bowel or bladder habits, etc...    Reviewed expectations re: course of current medical issues. Questions answered. Outlined signs and symptoms indicating need for more acute intervention. Patient verbalized understanding. After Visit Summary given.    Rennis Harding, PA-C 07/13/18 1258

## 2018-07-13 NOTE — Discharge Instructions (Addendum)
Knee pain: Knee brace given Continue conservative management of rest, ice, elevation, and gentle stretches Take naproxen as needed for pain relief (may cause abdominal discomfort, ulcers, and GI bleeds avoid taking with other NSAIDs) Follow up with orthopedist for further evaluation and management of chronic knee pain.  I have included contact information for Dr. Amanda Pea as well as [facility].   Return or go to the ER if you have any new or worsening symptoms (fever, chills, chest pain, abdominal pain, changes in bowel or bladder habits, pain radiating into lower legs, numbness, tingling, calf pain, etc...)   Vaginal discharge: Given rocephin 250mg  injection and azithromycin 1g in office Vaginal self swab obtained HIV/ syphilis testing today Prescribed metronidazole 500 mg twice daily for 7 days (do not take while consuming alcohol) Take medications as prescribed and to completion We will follow up with you regarding the results of your test If tests are positive, please abstain from sexual activity for at least 7 days and notify partners Follow up with PCP if symptoms persists Return here or go to ER if you have any new or worsening symptoms nausea, vomiting, abdominal or pelvic pain, vaginal odor, vaginal itching, changes in bowel or bladder habits, etc..Marland Kitchen

## 2018-07-13 NOTE — ED Triage Notes (Signed)
Left knee pain began about 3 days ago , denies injury , also states excess vaginal discharge, denies odor or itching, just increased wetness

## 2018-07-14 LAB — RPR: RPR Ser Ql: NONREACTIVE

## 2018-07-14 LAB — HIV ANTIBODY (ROUTINE TESTING W REFLEX): HIV Screen 4th Generation wRfx: NONREACTIVE

## 2018-07-15 ENCOUNTER — Telehealth (HOSPITAL_COMMUNITY): Payer: Self-pay | Admitting: Emergency Medicine

## 2018-07-15 LAB — CERVICOVAGINAL ANCILLARY ONLY
Bacterial vaginitis: POSITIVE — AB
Candida vaginitis: NEGATIVE
Chlamydia: NEGATIVE
Neisseria Gonorrhea: NEGATIVE
Trichomonas: POSITIVE — AB

## 2018-07-15 NOTE — Telephone Encounter (Signed)
Bacterial Vaginosis test is positive.  Prescription for metronidazole was given at the urgent care visit. Pt contacted regarding results. Answered all questions. Verbalized understanding.  Trichomonas is positive. Rx metronidazole was given at the urgent care visit. Pt needs education to please refrain from sexual intercourse for 7 days to give the medicine time to work. Sexual partners need to be notified and tested/treated. Condoms may reduce risk of reinfection. Recheck for further evaluation if symptoms are not improving.   Patient contacted and made aware of all results, all questions answered.   

## 2018-08-20 ENCOUNTER — Other Ambulatory Visit: Payer: Self-pay

## 2018-08-20 ENCOUNTER — Ambulatory Visit (HOSPITAL_COMMUNITY)
Admission: EM | Admit: 2018-08-20 | Discharge: 2018-08-20 | Disposition: A | Discharge status: Home / Self Care | Payer: No Typology Code available for payment source | Attending: Family Medicine | Admitting: Family Medicine

## 2018-08-20 ENCOUNTER — Encounter (HOSPITAL_COMMUNITY): Payer: Self-pay

## 2018-08-20 DIAGNOSIS — K0889 Other specified disorders of teeth and supporting structures: Secondary | ICD-10-CM | POA: Diagnosis not present

## 2018-08-20 MED ORDER — HYDROCODONE-ACETAMINOPHEN 5-325 MG PO TABS
ORAL_TABLET | ORAL | Status: AC
  Filled 2018-08-20: qty 1

## 2018-08-20 MED ORDER — PENICILLIN V POTASSIUM 500 MG PO TABS: 500.0000 mg | ORAL_TABLET | Freq: Four times a day (QID) | ORAL | 0 refills | Status: AC

## 2018-08-20 MED ORDER — NAPROXEN 500 MG PO TABS: 500.0000 mg | ORAL_TABLET | Freq: Two times a day (BID) | ORAL | 0 refills | Status: DC

## 2018-08-20 MED ORDER — HYDROCODONE-ACETAMINOPHEN 5-325 MG PO TABS
1.0000 | ORAL_TABLET | Freq: Once | ORAL | Status: AC
  Administered 2018-08-20: 1 via ORAL

## 2018-08-20 NOTE — Discharge Instructions (Signed)
Complete course of antibiotics.  Naproxen twice a day, take with food.  Ice application.  Please follow up with dentist for definitive treatment.

## 2018-08-20 NOTE — ED Triage Notes (Signed)
Patient presents to Urgent Care with complaints of left sided dental pain since 6 days ago. Patient reports she has never had dental pain in the past, but "if that's what it is, you can take all them out". Pt has been taking naproxen and tylenol with no relief; pt's right face is swollen slightly.

## 2018-08-20 NOTE — ED Provider Notes (Signed)
[facility]    CSN: [id number] Arrival date & time: 08/20/18  1326     History   Chief Complaint Chief Complaint  Patient presents with  . Appointment  . Dental Pain    HPI Alicia Wood is a 48 y.o. female.   Alicia Wood presents with complaints of right sided dental and face pain. Started approximately 5/24 and has worsened. No specific known injury or abnormality to dentition. Doesn't follow regularly with a dentist. Pain to her entire right face. No known fevers. Denies any previous similar issues. Has been taking tylenol and naproxen which haven't helped. No drainage. No fevers. Pain with chewing. Pain 10/10. Hx of chf, gerd, pericarditis.     ROS per HPI, negative if not otherwise mentioned.      Past Medical History:  Diagnosis Date  . Chest pain    a. Initially in setting of pericarditis in 11/2015; b. Ongoing cp despite prednisone, colchicine, ibuprofen,and narcotic therapy;  c. 12/2015 CTA Chest: No PE, stable small-mod pericardial effusion;  d. 12/2015 Cath: nl cors.  . Chronic diastolic CHF (congestive heart failure) (HCC)    a. 11/2015 Echo: EF 55-60%, grade 2 diastolic dysfxn.  Marland Kitchen GERD (gastroesophageal reflux disease)   . Headache   . Hyperlipidemia   . Hypertension    Patient states she does not have hypertension.   . Pericardial effusion    a. 11/23/2015 Echo: mod pericardial effusion, no tamponade; b. 12/05/2015 Echo: small circumferential pericardial effusion; c. 12/06/2015 Cardiac MRI: pericardial thickening w/ circumferential late gad enhancement of pericardium and epicardium-->suspicious for subacute effusive constrictive pericarditis;  d. 12/19/2015 Echo: triv pericardial effusion;  e. 01/06/2016 CT Chest: small to mod pericard eff.  . Pericarditis    a. 11/2015 Cardiac MRI; EF 57%, mild MR, triv TR, mild pericardial effusion, thickening of pericardium focally up to 8mm w/ significant circumferential late gad enhancement of pericardium and  epicardium sugg of subacute effusive constrictive pericarditis;  b. 12/2015 R Heart Cath: nl right heart pressures w/o evidence of constrictive physiology.    Patient Active Problem List   Diagnosis Date Noted  . Epigastric abdominal tenderness 01/30/2016  . Dysphagia 01/30/2016  . GERD (gastroesophageal reflux disease) 01/30/2016  . Pericarditis 12/17/2015  . Constrictive pericarditis   . Chronic diastolic CHF (congestive heart failure) (HCC)   . Pulmonary hypertension (HCC)   . Precordial pain 12/05/2015  . Hypotension 12/05/2015  . Arterial hypotension   . Pericardial effusion 11/22/2015    Past Surgical History:  Procedure Laterality Date  . ABDOMINAL HYSTERECTOMY    . APPENDECTOMY    . CARDIAC CATHETERIZATION N/A 01/21/2016   Procedure: Right/Left Heart Cath and Coronary Angiography;  Surgeon: Kathleene Hazel, MD;  Location: [facility] INVASIVE CV LAB;  Service: Cardiovascular;  Laterality: N/A;  . ESOPHAGOGASTRODUODENOSCOPY (EGD) WITH PROPOFOL N/A 01/30/2016   Procedure: ESOPHAGOGASTRODUODENOSCOPY (EGD) WITH PROPOFOL;  Surgeon: [name], MD;  Location: [facility] ENDOSCOPY;  Service: Endoscopy;  Laterality: N/A;  . PARTIAL HYSTERECTOMY    . TUBAL LIGATION      OB History   No obstetric history on file.      Home Medications    Prior to Admission medications   Medication Sig Start Date End Date Taking? Authorizing Provider  metroNIDAZOLE (FLAGYL) 500 MG tablet Take 1 tablet (500 mg total) by mouth 2 (two) times daily. 07/13/18   Wurst, [place], PA-C  naproxen (NAPROSYN) 500 MG tablet Take 1 tablet (500 mg total) by mouth 2 (two) times daily. 08/20/18  Georgetta HaberBurky, Adylee Leonardo B, NP  penicillin v potassium (VEETID) 500 MG tablet Take 1 tablet (500 mg total) by mouth 4 (four) times daily for 7 days. 08/20/18 08/27/18  Georgetta HaberBurky, Kendy Haston B, NP    Family History Family History  Problem Relation Age of Onset  . Hypertension Mother   . Cancer Mother   . Healthy Father     Social  History Social History   Tobacco Use  . Smoking status: Never Smoker  . Smokeless tobacco: Never Used  Substance Use Topics  . Alcohol use: Yes    Comment: occasional  . Drug use: No     Allergies   Patient has no known allergies.   Review of Systems Review of Systems   Physical Exam Triage Vital Signs ED Triage Vitals  Enc Vitals Group     BP 08/20/18 1345 (!) 139/93     Pulse Rate 08/20/18 1345 76     Resp 08/20/18 1345 19     Temp 08/20/18 1345 (!) 97.4 F (36.3 C)     Temp Source 08/20/18 1345 Oral     SpO2 08/20/18 1345 100 %     Weight --      Height --      Head Circumference --      Peak Flow --      Pain Score 08/20/18 1344 10     Pain Loc --      Pain Edu? --      Excl. in GC? --    No data found.  Updated Vital Signs BP (!) 139/93 (BP Location: Left Arm)   Pulse 76   Temp (!) 97.4 F (36.3 C) (Oral)   Resp 19   SpO2 100%    Physical Exam Constitutional:      General: She is not in acute distress.    Appearance: She is well-developed.  HENT:     Head:      Comments: Tenderness underlying tooth #31 with breakdown of tooth noted with mild redness at gumline; mild tenderness to generalized right face without redness firmness or venous swelling visible; no obvious visible abscess presence Cardiovascular:     Rate and Rhythm: Normal rate and regular rhythm.     Heart sounds: Normal heart sounds.  Pulmonary:     Effort: Pulmonary effort is normal.     Breath sounds: Normal breath sounds.  Skin:    General: Skin is warm and dry.  Neurological:     Mental Status: She is alert and oriented to person, place, and time.      UC Treatments / Results  Labs (all labs ordered are listed, but only abnormal results are displayed) Labs Reviewed - No data to display  EKG None  Radiology No results found.  Procedures Procedures (including critical care time)  Medications Ordered in UC Medications  HYDROcodone-acetaminophen (NORCO/VICODIN)  5-325 MG per tablet 1 tablet (1 tablet Oral Given 08/20/18 1409)    Initial Impression / Assessment and Plan / UC Course  I have reviewed the triage vital signs and the nursing notes.  Pertinent labs & imaging results that were available during my care of the patient were reviewed by me and considered in my medical decision making (see chart for details).     Antibiotics provided for concern for periodontal infection. Pain management discussed. Encouraged dental follow up. Patient verbalized understanding and agreeable to plan.   Final Clinical Impressions(s) / UC Diagnoses   Final diagnoses:  Pain, dental  Discharge Instructions     Complete course of antibiotics.  Naproxen twice a day, take with food.  Ice application.  Please follow up with dentist for definitive treatment.     ED Prescriptions    Medication Sig Dispense Auth. Provider   penicillin v potassium (VEETID) 500 MG tablet Take 1 tablet (500 mg total) by mouth 4 (four) times daily for 7 days. 28 tablet Linus Mako B, NP   naproxen (NAPROSYN) 500 MG tablet Take 1 tablet (500 mg total) by mouth 2 (two) times daily. 30 tablet Georgetta Haber, NP     Controlled Substance Prescriptions [place] Controlled Substance Registry consulted? Not Applicable   Georgetta Haber, NP 08/20/18 1557

## 2018-09-28 ENCOUNTER — Ambulatory Visit (HOSPITAL_COMMUNITY)
Admission: EM | Admit: 2018-09-28 | Discharge: 2018-09-28 | Disposition: A | Discharge status: Home / Self Care | Payer: No Typology Code available for payment source | Attending: Emergency Medicine | Admitting: Emergency Medicine

## 2018-09-28 ENCOUNTER — Encounter (HOSPITAL_COMMUNITY): Payer: Self-pay

## 2018-09-28 ENCOUNTER — Other Ambulatory Visit: Payer: Self-pay

## 2018-09-28 DIAGNOSIS — M25562 Pain in left knee: Secondary | ICD-10-CM

## 2018-09-28 DIAGNOSIS — M1712 Unilateral primary osteoarthritis, left knee: Secondary | ICD-10-CM

## 2018-09-28 MED ORDER — IBUPROFEN 600 MG PO TABS: 600.0000 mg | ORAL_TABLET | Freq: Four times a day (QID) | ORAL | 0 refills | Status: DC | PRN

## 2018-09-28 MED ORDER — HYDROCODONE-ACETAMINOPHEN 5-325 MG PO TABS: 1.0000 | ORAL_TABLET | Freq: Four times a day (QID) | ORAL | 0 refills | Status: DC | PRN

## 2018-09-28 NOTE — Discharge Instructions (Addendum)
Try icing your knee for 20 minutes at a time at the end of the day.  Take 600 mg ibuprofen with a Tylenol containing product 3 or 4 times a day as needed for pain.  You may take the ibuprofen with 1 g of regular extra strength Tylenol for mild to moderate pain, or the ibuprofen with 1-2 Norco for severe pain.  Do not take regular Tylenol with the Norco as they both have Tylenol in them and too much can hurt your liver.  Do not exceed 4 g of Tylenol from all sources in 1 day.  Wear the knee immobilizer at night for comfort.  Follow-up with an orthopedic surgeon as soon as you possibly can for further evaluation and management.

## 2018-09-28 NOTE — ED Triage Notes (Signed)
Pt states this is a on going issue with her left knee for 3 weeks now. Pt states she's in constant pain. Pt states she has tried Naproxen and it's not working for the pain.

## 2018-09-28 NOTE — ED Provider Notes (Signed)
HPI  SUBJECTIVE:  Alicia Wood is a 48 y.o. female who presents with recurrent left knee pain for the past 3 weeks.  She does a lot of walking at work.  She describes the pain as throbbing, constant, located along the anterior and medial knee.  She states that the knee feels "tight" with walking.  She reports swelling several days ago.  No fevers, erythema, increased temperature of the joint.  No change in physical activity.  No trauma, fall.  She reports some tingling at the bottom of her foot.  No distal numbness.  No calf pain, swelling.  No popping, catching, but she states that the knee is giving way on her.  Not sure if this is due to pain or true weakness.  She states that the pain is worse when she bends her knee, rolling over in bed at night.  She has tried Naprosyn, ibuprofen 800 mg, Tylenol arthritis without improvement in her symptoms.  Symptoms are worse with walking, going up and down stairs, sitting for prolonged periods of time.  No history of left knee injury.  History of pericarditis, osteoarthritis, CHF.  No history of kidney disease, diabetes, rheumatoid arthritis, gout, DVT, PE, smoking.  LMP: Status post hysterectomy.  ZOX:WRUEAVWPMD:Koirala, Dibas, MD   Patient has been seen here twice for left knee pain.  Once in January, had an x-ray showing minimal degenerative changes, again in April, was referred to orthopedics.   Past Medical History:  Diagnosis Date  . Chest pain    a. Initially in setting of pericarditis in 11/2015; b. Ongoing cp despite prednisone, colchicine, ibuprofen,and narcotic therapy;  c. 12/2015 CTA Chest: No PE, stable small-mod pericardial effusion;  d. 12/2015 Cath: nl cors.  . Chronic diastolic CHF (congestive heart failure) (HCC)    a. 11/2015 Echo: EF 55-60%, grade 2 diastolic dysfxn.  Marland Kitchen. GERD (gastroesophageal reflux disease)   . Headache   . Hyperlipidemia   . Hypertension    Patient states she does not have hypertension.   . Pericardial effusion    a.  11/23/2015 Echo: mod pericardial effusion, no tamponade; b. 12/05/2015 Echo: small circumferential pericardial effusion; c. 12/06/2015 Cardiac MRI: pericardial thickening w/ circumferential late gad enhancement of pericardium and epicardium-->suspicious for subacute effusive constrictive pericarditis;  d. 12/19/2015 Echo: triv pericardial effusion;  e. 01/06/2016 CT Chest: small to mod pericard eff.  . Pericarditis    a. 11/2015 Cardiac MRI; EF 57%, mild MR, triv TR, mild pericardial effusion, thickening of pericardium focally up to 8mm w/ significant circumferential late gad enhancement of pericardium and epicardium sugg of subacute effusive constrictive pericarditis;  b. 12/2015 R Heart Cath: nl right heart pressures w/o evidence of constrictive physiology.    Past Surgical History:  Procedure Laterality Date  . ABDOMINAL HYSTERECTOMY    . APPENDECTOMY    . CARDIAC CATHETERIZATION N/A 01/21/2016   Procedure: Right/Left Heart Cath and Coronary Angiography;  Surgeon: Kathleene Hazelhristopher D McAlhany, MD;  [facility];  Service: Cardiovascular;  Laterality: N/A;  . ESOPHAGOGASTRODUODENOSCOPY (EGD) WITH PROPOFOL N/A 01/30/2016   Procedure: ESOPHAGOGASTRODUODENOSCOPY (EGD) WITH PROPOFOL;  Surgeon: Charlott RakesVincent Schooler, MD;  [facility];  Service: Endoscopy;  Laterality: N/A;  . PARTIAL HYSTERECTOMY    . TUBAL LIGATION      Family History  Problem Relation Age of Onset  . Hypertension Mother   . Cancer Mother   . Healthy Father     Social History   Tobacco Use  . Smoking status: Never Smoker  .  Smokeless tobacco: Never Used  Substance Use Topics  . Alcohol use: Yes    Comment: occasional  . Drug use: No    No current facility-administered medications for this encounter.   Current Outpatient Medications:  .  HYDROcodone-acetaminophen (NORCO/VICODIN) 5-325 MG tablet, Take 1-2 tablets by mouth every 6 (six) hours as needed for moderate pain or severe pain., Disp: 12 tablet, Rfl:  0 .  ibuprofen (ADVIL) 600 MG tablet, Take 1 tablet (600 mg total) by mouth every 6 (six) hours as needed., Disp: 30 tablet, Rfl: 0  No Known Allergies   ROS  As noted in HPI.   Physical Exam  BP 123/85 (BP Location: Right Arm)   Pulse 77   Temp 98.2 F (36.8 C) (Oral)   Resp 20   Wt 135.2 kg   SpO2 100%   BMI 44.65 kg/m   Constitutional: Well developed, well nourished, no acute distress Eyes:  EOMI, conjunctiva normal bilaterally HENT: Normocephalic, atraumatic,mucus membranes moist Respiratory: Normal inspiratory effort Cardiovascular: Normal rate GI: nondistended skin: No rash, skin intact Musculoskeletal: L Knee ROM / decreased due to pain, Flexion  intact , Tenderness entire joint, Patella NT, Patellar tendon NT, Medial joint very tender, Lateral joint  tender, Popliteal region mildly tender, no tenderness along the gastrocs, no palpable cord, varus LCL stress testing stable, Valgus MCL stress testing stable but painful, McMurray's testing  abnormal, Lachman's negative. Distal NVI with intact baseline sensation / motor / pulse distal to knee.  No effusion. No erythema. No increased temperature.  Positive crepitus.  Calves symmetric. Neurologic: Alert & oriented x 3, no focal neuro deficits Psychiatric: Speech and behavior appropriate   ED Course   Medications - No data to display  Orders Placed This Encounter  Procedures  . Apply knee immobilizer    At night    Standing Status:   Standing    Number of Occurrences:   1    Order Specific Question:   Laterality    Answer:   Left    No results found for this or any previous visit (from the past 24 hour(s)). No results found.  ED Clinical Impression  1. Acute pain of left knee   2. Osteoarthritis of left knee, unspecified osteoarthritis type      ED Assessment/Plan   [place] Narcotic database reviewed for this patient, and feel that the risk/benefit ratio today is favorable for proceeding with a prescription  for controlled substance.  Last opiate prescription was for  20 Norco in June 2020.  Patient with acute on chronic knee pain.  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  Doubt gout, DVT, septic joint.  Will have her wear knee immobilizer at night for comfort, ice, Tylenol/ibuprofen combination, Norco/ibuprofen for severe pain, referral to several orthopedic practices for further evaluation and management.  Did not repeat x-rays today as she has not had any trauma to the knee and we have x-rays that documented degenerative changes from January of this year.  Discussed MDM, treatment plan, and plan for follow-up with patient. Discussed sn/sx that should prompt return to the ED. patient agrees with plan.   Meds ordered this encounter  Medications  . ibuprofen (ADVIL) 600 MG tablet    Sig: Take 1 tablet (600 mg total) by mouth every 6 (six) hours as needed.    Dispense:  30 tablet    Refill:  0  . HYDROcodone-acetaminophen (NORCO/VICODIN) 5-325 MG tablet    Sig: Take 1-2 tablets by mouth every 6 (six) hours as needed  for moderate pain or severe pain.    Dispense:  12 tablet    Refill:  0    *This clinic note was created using Scientist, clinical (histocompatibility and immunogenetics)Dragon dictation software. Therefore, there may be occasional mistakes despite careful proofreading.   ?   Domenick GongMortenson, Halen Antenucci, MD 09/28/18 1020

## 2018-10-11 ENCOUNTER — Emergency Department (HOSPITAL_COMMUNITY)
Admission: EM | Admit: 2018-10-11 | Discharge: 2018-10-11 | Disposition: A | Discharge status: Home / Self Care | Payer: No Typology Code available for payment source | Attending: Emergency Medicine | Admitting: Emergency Medicine

## 2018-10-11 ENCOUNTER — Encounter (HOSPITAL_COMMUNITY): Payer: Self-pay

## 2018-10-11 ENCOUNTER — Other Ambulatory Visit: Payer: Self-pay

## 2018-10-11 ENCOUNTER — Emergency Department (HOSPITAL_COMMUNITY): Payer: No Typology Code available for payment source

## 2018-10-11 DIAGNOSIS — I110 Hypertensive heart disease with heart failure: Secondary | ICD-10-CM | POA: Insufficient documentation

## 2018-10-11 DIAGNOSIS — I5032 Chronic diastolic (congestive) heart failure: Secondary | ICD-10-CM | POA: Insufficient documentation

## 2018-10-11 DIAGNOSIS — M25562 Pain in left knee: Secondary | ICD-10-CM | POA: Diagnosis not present

## 2018-10-11 DIAGNOSIS — G8929 Other chronic pain: Secondary | ICD-10-CM | POA: Insufficient documentation

## 2018-10-11 MED ORDER — OXYCODONE-ACETAMINOPHEN 5-325 MG PO TABS
1.0000 | ORAL_TABLET | Freq: Once | ORAL | Status: DC
  Filled 2018-10-11: qty 1

## 2018-10-11 MED ORDER — HYDROCODONE-ACETAMINOPHEN 5-325 MG PO TABS: 1.0000 | ORAL_TABLET | Freq: Four times a day (QID) | ORAL | 0 refills | Status: DC | PRN

## 2018-10-11 MED ORDER — IBUPROFEN 200 MG PO TABS
600.0000 mg | ORAL_TABLET | Freq: Once | ORAL | Status: AC
  Administered 2018-10-11: 600 mg via ORAL
  Filled 2018-10-11: qty 3

## 2018-10-11 NOTE — ED Triage Notes (Addendum)
Pt states that she has been having left knee pain x multiple weeks. Pt states that she was supposed to have MRI, but cannot get in until next week. Pt was seen by orthopedist and given rx, but no relief.

## 2018-10-11 NOTE — Discharge Instructions (Addendum)
Call your orthopedic team for follow up

## 2018-10-11 NOTE — ED Provider Notes (Signed)
[facility] DEPT Provider Note   CSN: [id number] Arrival date & time: 10/11/18  1152     History   Chief Complaint Chief Complaint  Patient presents with  . Knee Pain    HPI Alicia Wood is a 48 y.o. female.     HPI Patient is a 48 year old female presents the emergency department with ongoing recurrent pain in her left knee over the past 6 months.  She had an x-ray in January which demonstrated mild arthritis.  She was seen by her orthopedic team last week and received an injection of her left knee.  She reports no assistance with the pain.  She is tried pain medication without improvement.  She comes the ER for further evaluation.  She is scheduled for an MRI next week.  She attempted to contact her orthopedic team today but they were unable to see her.  Her pain is moderate to severe in severity and worse with range of motion and ambulating.  No fevers.  No recent injury or trauma.  No other complaints. Past Medical History:  Diagnosis Date  . Chest pain    a. Initially in setting of pericarditis in 11/2015; b. Ongoing cp despite prednisone, colchicine, ibuprofen,and narcotic therapy;  c. 12/2015 CTA Chest: No PE, stable small-mod pericardial effusion;  d. 12/2015 Cath: nl cors.  . Chronic diastolic CHF (congestive heart failure) ([place])    a. 11/2015 Echo: EF 08-14%, grade 2 diastolic dysfxn.  Marland Kitchen GERD (gastroesophageal reflux disease)   . Headache   . Hyperlipidemia   . Hypertension    Patient states she does not have hypertension.   . Pericardial effusion    a. 11/23/2015 Echo: mod pericardial effusion, no tamponade; b. 12/05/2015 Echo: small circumferential pericardial effusion; c. 12/06/2015 Cardiac MRI: pericardial thickening w/ circumferential late gad enhancement of pericardium and epicardium-->suspicious for subacute effusive constrictive pericarditis;  d. 12/19/2015 Echo: triv pericardial effusion;  e. 01/06/2016 CT Chest: small to mod pericard  eff.  . Pericarditis    a. 11/2015 Cardiac MRI; EF 57%, mild MR, triv TR, mild pericardial effusion, thickening of pericardium focally up to 77mm w/ significant circumferential late gad enhancement of pericardium and epicardium sugg of subacute effusive constrictive pericarditis;  b. 12/2015 R Heart Cath: nl right heart pressures w/o evidence of constrictive physiology.    Patient Active Problem List   Diagnosis Date Noted  . Epigastric abdominal tenderness 01/30/2016  . Dysphagia 01/30/2016  . GERD (gastroesophageal reflux disease) 01/30/2016  . Pericarditis 12/17/2015  . Constrictive pericarditis   . Chronic diastolic CHF (congestive heart failure) ([place])   . Pulmonary hypertension ([place])   . Precordial pain 12/05/2015  . Hypotension 12/05/2015  . Arterial hypotension   . Pericardial effusion 11/22/2015    Past Surgical History:  Procedure Laterality Date  . ABDOMINAL HYSTERECTOMY    . APPENDECTOMY    . CARDIAC CATHETERIZATION N/A 01/21/2016   Procedure: Right/Left Heart Cath and Coronary Angiography;  Surgeon: Burnell Blanks, MD;  Location: [facility] CV LAB;  Service: Cardiovascular;  Laterality: N/A;  . ESOPHAGOGASTRODUODENOSCOPY (EGD) WITH PROPOFOL N/A 01/30/2016   Procedure: ESOPHAGOGASTRODUODENOSCOPY (EGD) WITH PROPOFOL;  Surgeon: Wilford Corner, MD;  Location[facility] ENDOSCOPY;  Service: Endoscopy;  Laterality: N/A;  . PARTIAL HYSTERECTOMY    . TUBAL LIGATION       OB History   No obstetric history on file.      Home Medications    Prior to Admission medications   Medication Sig Start Date End  Date Taking? Authorizing Provider  HYDROcodone-acetaminophen (NORCO/VICODIN) 5-325 MG tablet Take 1-2 tablets by mouth every 6 (six) hours as needed for moderate pain or severe pain. 10/11/18   Azalia Bilisampos, Ishmel Acevedo, MD  ibuprofen (ADVIL) 600 MG tablet Take 1 tablet (600 mg total) by mouth every 6 (six) hours as needed. 09/28/18   Domenick GongMortenson, Ashley, MD    Family History Family  History  Problem Relation Age of Onset  . Hypertension Mother   . Cancer Mother   . Healthy Father     Social History Social History   Tobacco Use  . Smoking status: Never Smoker  . Smokeless tobacco: Never Used  Substance Use Topics  . Alcohol use: Yes    Comment: occasional  . Drug use: No     Allergies   Patient has no known allergies.   Review of Systems Review of Systems  All other systems reviewed and are negative.    Physical Exam Updated Vital Signs BP (!) 155/95 (BP Location: Right Arm)   Pulse 77   Temp 98.5 F (36.9 C) (Oral)   Resp 20   Wt 135 kg   SpO2 100%   BMI 44.59 kg/m   Physical Exam Vitals signs and nursing note reviewed.  Constitutional:      Appearance: She is well-developed.  HENT:     Head: Normocephalic.  Neck:     Musculoskeletal: Normal range of motion.  Pulmonary:     Effort: Pulmonary effort is normal.  Abdominal:     General: There is no distension.  Musculoskeletal: Normal range of motion.     Comments: Mild generalized tenderness to the left knee without obvious effusion or warmth or erythema.  Able to range the left knee.  Compartments are soft  Neurological:     Mental Status: She is alert and oriented to person, place, and time.      ED Treatments / Results  Labs (all labs ordered are listed, but only abnormal results are displayed) Labs Reviewed - No data to display  EKG None  Radiology Dg Knee Complete 4 Views Left  Result Date: 10/11/2018 CLINICAL DATA:  Left knee pain for the past 6 months. No known injury. EXAM: LEFT KNEE - COMPLETE 4+ VIEW COMPARISON:  04/11/2018. FINDINGS: Mild medial joint space narrowing. Mild spur formation involving all 3 joint compartments. No effusion seen. IMPRESSION: Mild tricompartmental degenerative changes without significant change. Electronically Signed   By: Beckie SaltsSteven  Reid M.D.   On: 10/11/2018 12:50    Procedures Procedures (including critical care time)  Medications  Ordered in ED Medications  oxyCODONE-acetaminophen (PERCOCET/ROXICET) 5-325 MG per tablet 1 tablet (has no administration in time range)  ibuprofen (ADVIL) tablet 600 mg (600 mg Oral Given 10/11/18 1225)     Initial Impression / Assessment and Plan / ED Course  I have reviewed the triage vital signs and the nursing notes.  Pertinent labs & imaging results that were available during my care of the patient were reviewed by me and considered in my medical decision making (see chart for details).        X-ray here demonstrates tricompartmental arthritis.  She will need ongoing follow-up with her orthopedic team and scheduled MRI next week.  No indication for additional work-up or admission the hospital at this time.  Doubt septic arthritis.  Crutches provided for the patient.  Short prescription for hydrocodone.  She understands the importance of close orthopedic follow-up  Final Clinical Impressions(s) / ED Diagnoses   Final diagnoses:  Chronic pain of left knee    ED Discharge Orders         Ordered    HYDROcodone-acetaminophen (NORCO/VICODIN) 5-325 MG tablet  Every 6 hours PRN     10/11/18 1304           Azalia Bilisampos, Phares Zaccone, MD 10/11/18 1306

## 2018-11-01 ENCOUNTER — Telehealth: Payer: Self-pay | Admitting: Cardiology

## 2018-11-01 NOTE — Telephone Encounter (Signed)
New Message     Patient suppose to have surgical procedure Wednesday 11/03/18 and needs surgical clearance appointment but no open spots available.  Tried to schedule for first available appointment 11/16/18 but patient states that doesn't help her.  Please call patient back to see what can be done.  Patient also calling office doing procedure to let them know.

## 2018-11-02 ENCOUNTER — Telehealth: Payer: Self-pay

## 2018-11-02 NOTE — Telephone Encounter (Signed)
   [facility] Pre-operative Risk Assessment    Request for surgical clearance:  1. What type of surgery is being performed? Knee Arthroscopy    2. When is this surgery scheduled? 11/03/18   3. What type of clearance is required (medical clearance vs. Pharmacy clearance to hold med vs. Both)? Medical  4. Are there any medications that need to be held prior to surgery and how long? Not specified    5. Practice name and name of physician performing surgery? [facility] ( Dr. [name])   6. What is your office phone number [phone]    7.   What is your office fax number [phone]  8.   Anesthesia type (None, local, MAC, general) ?  Not Specified   Meryl Crutch 11/02/2018, 8:34 AM  _________________________________________________________________   (provider comments below)

## 2018-11-02 NOTE — Telephone Encounter (Signed)
   Primary Cardiologist: Minus Breeding, MD  Chart reviewed as part of pre-operative protocol coverage. Request or medical clearance was received by our office on 11/02/18 for a procedure scheduled on 11/03/18. The patient has not been seen by [facility] since 2018. For cardiac clearance, we would need to see the patient back. As of yesterday, our first available appt was 11/16/18.   Of note, right and left heart catheterization performed on 01/21/2016 showed no obstructive coronary artery disease. She does have a history of pericardial effusion and constrictive pericarditis (2017).   If requesting office would like to proceed with cardiac clearance, please let our office know and we will schedule the appointment. If surgeon will proceed without clearance, please also let us know.    Pre-op covering staff: - Please schedule appointment and call patient to inform them. - Please contact requesting surgeon's office via preferred method (i.e, phone, fax) to inform them of need for appointment prior to surgery.  Tami Lin Duke, PA 11/02/2018, 10:51 AM

## 2018-11-02 NOTE — Telephone Encounter (Signed)
Spoke with pt to attempt to schedule appt with Dr. Percival Spanish for clearance. Pt stated her surgery is tomorrow and she cannot delay it. She stated someone told her she would not need an  Appt. I explained to pt that I will call surgeons office to find out if they will proceed with surgery without clearance appt.   Attempted to reach [facility] and was on hold for almost an hour. Will attempt to call again and call pt to inform if appt needed or not.

## 2018-11-02 NOTE — Telephone Encounter (Signed)
Spoke with Caryl Pina from [facility] who stated Anesthesiology has already cleared the pt for her surgery and pt will only be having local anesthesia. She stated to disregard clearance and apologized for the confusion.  Will call pt to inform her as well.

## 2018-11-02 NOTE — Telephone Encounter (Signed)
Informed pt that we have received clearance form. Nurse will route to pre-op department.

## 2018-11-02 NOTE — Telephone Encounter (Signed)
Spoke with pt who report surgeon's office faxed over a surgical clearance form yesterday. Informed pt that we have yet to receive it. Advised pt to have office re-fax form to [phone]. Pt verbalized understanding.

## 2018-11-22 ENCOUNTER — Other Ambulatory Visit: Payer: Self-pay

## 2018-11-22 ENCOUNTER — Ambulatory Visit: Payer: No Typology Code available for payment source | Attending: Orthopedic Surgery | Admitting: Physical Therapy

## 2018-11-22 ENCOUNTER — Encounter: Payer: Self-pay | Admitting: Physical Therapy

## 2018-11-22 DIAGNOSIS — M25662 Stiffness of left knee, not elsewhere classified: Secondary | ICD-10-CM | POA: Insufficient documentation

## 2018-11-22 DIAGNOSIS — M25562 Pain in left knee: Secondary | ICD-10-CM | POA: Diagnosis present

## 2018-11-22 DIAGNOSIS — M6281 Muscle weakness (generalized): Secondary | ICD-10-CM | POA: Insufficient documentation

## 2018-11-22 NOTE — Therapy (Addendum)
[facility] [address], [address] [place], [place], [address] Phone: [phone]   Fax:  [phone]  Physical Therapy Evaluation  Patient Details  Name: Alicia Wood MRN: [id number] Date of Birth: [birth year] Referring Provider (PT): Malon Kindle, MD   Encounter Date: 11/22/2018  PT End of Session - 11/22/18 1358    Visit Number  1    Date for PT Re-Evaluation  01/17/19    PT Start Time  1145    PT Stop Time  1225    PT Time Calculation (min)  40 min    Activity Tolerance  Patient tolerated treatment well    Behavior During Therapy  [facility] for tasks assessed/performed       Past Medical History:  Diagnosis Date  . Chest pain    a. Initially in setting of pericarditis in 11/2015; b. Ongoing cp despite prednisone, colchicine, ibuprofen,and narcotic therapy;  c. 12/2015 CTA Chest: No PE, stable small-mod pericardial effusion;  d. 12/2015 Cath: nl cors.  . Chronic diastolic CHF (congestive heart failure) (HCC)    a. 11/2015 Echo: EF 55-60%, grade 2 diastolic dysfxn.  Marland Kitchen GERD (gastroesophageal reflux disease)   . Headache   . Hyperlipidemia   . Hypertension    Patient states she does not have hypertension.   . Pericardial effusion    a. 11/23/2015 Echo: mod pericardial effusion, no tamponade; b. 12/05/2015 Echo: small circumferential pericardial effusion; c. 12/06/2015 Cardiac MRI: pericardial thickening w/ circumferential late gad enhancement of pericardium and epicardium-->suspicious for subacute effusive constrictive pericarditis;  d. 12/19/2015 Echo: triv pericardial effusion;  e. 01/06/2016 CT Chest: small to mod pericard eff.  . Pericarditis    a. 11/2015 Cardiac MRI; EF 57%, mild MR, triv TR, mild pericardial effusion, thickening of pericardium focally up to 29mm w/ significant circumferential late gad enhancement of pericardium and epicardium sugg of subacute effusive constrictive pericarditis;  b. 12/2015 R Heart Cath: nl right  heart pressures w/o evidence of constrictive physiology.    Past Surgical History:  Procedure Laterality Date  . ABDOMINAL HYSTERECTOMY    . APPENDECTOMY    . CARDIAC CATHETERIZATION N/A 01/21/2016   Procedure: Right/Left Heart Cath and Coronary Angiography;  Surgeon: Kathleene Hazel, MD;  Location: [facility] INVASIVE CV LAB;  Service: Cardiovascular;  Laterality: N/A;  . ESOPHAGOGASTRODUODENOSCOPY (EGD) WITH PROPOFOL N/A 01/30/2016   Procedure: ESOPHAGOGASTRODUODENOSCOPY (EGD) WITH PROPOFOL;  Surgeon: [name], MD;  Location: [facility] ENDOSCOPY;  Service: Endoscopy;  Laterality: N/A;  . PARTIAL HYSTERECTOMY    . TUBAL LIGATION      There were no vitals filed for this visit.   Subjective Assessment - 11/22/18 1154    Subjective  Standing and lying down are the worst.  Can't sit long because it gets very stiff.  Pt reports no longer using any AD.  She is wearing an ace bandage for support.    Pertinent History  laproscopic knee surgery 11/03/18    Limitations  Sitting;Standing;Walking    How long can you sit comfortably?  5-10 min before it gets stiff    How long can you stand comfortably?  10    How long can you walk comfortably?  10    Patient Stated Goals  be able to walk without a limp    Currently in Pain?  Yes    Pain Score  8     Pain Location  Knee    Pain Orientation  Left;Anterior    Pain Descriptors / Indicators  Aching;Throbbing  Pain Type  Acute pain    Pain Onset  1 to 4 weeks ago    Pain Frequency  Constant    Aggravating Factors   standing on it    Pain Relieving Factors  ice, sleeping    Effect of Pain on Daily Activities  unable to do what I normally do    Multiple Pain Sites  No         [facility] PT Assessment - 11/23/18 0001      Assessment   Medical Diagnosis  Z51.89 (ICD-10-CM) - Encounter for other specified aftercare    Referring Provider (PT)  Malon KindleSteven Norris, MD    Onset Date/Surgical Date  11/03/18    Prior Therapy  No      Precautions    Precautions  None      Restrictions   Weight Bearing Restrictions  No      Balance Screen   Has the patient fallen in the past 6 months  No      Home Environment   Living Environment  Private residence    Living Arrangements  Spouse/significant other      Prior Function   Level of Independence  Independent    Vocation  Workers comp    [employer]affer[employer]ocation Requirements  work at [employer] and [employer]       Cognition   Overall Cognitive Status  Within Functional Limits for tasks assessed      Observation/Other Assessments   Skin Integrity  incision well healed, swelling around Left knee    Focus on Therapeutic Outcomes (FOTO)   61% limited      Posture/Postural Control   Posture Comments  Leaning to the Rt      PROM   Right Knee Extension  0    Right Knee Flexion  128    Left Knee Extension  16    Left Knee Flexion  90      Strength   Strength Assessment Site  Hip;Knee    Right/Left Knee  Right;Left    Right Knee Flexion  5/5    Right Knee Extension  5/5    Left Knee Flexion  4+/5   + pain   Left Knee Extension  4+/5   +pain     Ambulation/Gait   Gait Comments  antalgic left; decreased stance time left                 Objective measurements completed on examination: See above findings.      [facility] Adult PT Treatment/Exercise - 11/23/18 0001      Self-Care   Self-Care  Other Self-Care Comments    Other Self-Care Comments   educated and performed initial HEP and to keep knee straight as much as possible to regain full extension             PT Education - 11/22/18 1357    Education Details  Access Code: [email or web address]    Person(s) Educated  Patient    Methods  Explanation;Demonstration;Handout;Verbal cues    Comprehension  Verbalized understanding;Returned demonstration       PT Short Term Goals - 11/23/18 1224      PT SHORT TERM GOAL #1   Title  ind with initial HEP    Time  4    Period  Weeks    Status  New    Target Date  12/21/18      PT SHORT TERM  GOAL #2   Title  5/5 MMT Left knee  flexion and extension with no pain so she can return to work related activities.    Time  4    Period  Weeks    Status  New    Target Date  12/21/18      PT SHORT TERM GOAL #3   Title  pt will be able to straighten Lt LE to neutral    Time  4    Period  Weeks    Status  New    Target Date  12/21/18        PT Long Term Goals - 11/23/18 1221      PT LONG TERM GOAL #1   Title  Pt will be able to stand and walk for at least one hour so she can return to work    Time  8    Period  Weeks    Status  New    Target Date  01/17/19      PT LONG TERM GOAL #2   Title  pt will be ind with advanced HEP    Time  8    Period  Weeks    Status  New    Target Date  01/17/19      PT LONG TERM GOAL #3   Title  Pt will have at least 120 degrees of knee flexion on Left to return to all functional movements.    Time  8    Period  Weeks    Status  New    Target Date  01/17/19      PT LONG TERM GOAL #4   Title  Pt will be 41% or less limited based on FOTO    Time  8    Period  Weeks    Status  New    Target Date  01/17/19             Plan - 11/23/18 1214    Clinical Impression Statement  Pt is friendly 48 y/o female who presents to clnic today s/p left knee arthroscopy on 11/03/18.  Pt states she has had chronic knee pain.  Pt demonstrates decreased ROM and weakness as mentioned above.  She has pain when WB on left LE and when performing MMT on left LE.  She has swelling of the left knee visibly observerd.  The patient's incision looks well healed with no redness or abnormalities.  Pt will benefit from skilled PT to help with pain management and addressing impairments as mentioned above.    Stability/Clinical Decision Making  Stable/Uncomplicated    Clinical Decision Making  Low    Rehab Potential  Excellent    PT Frequency  2x / week    PT Duration  8 weeks    PT Treatment/Interventions  ADLs/Self Care Home Management;Aquatic  Therapy;Cryotherapy;Electrical Stimulation;Moist Heat;Therapeutic exercise;Therapeutic activities;Neuromuscular re-education;Patient/family education;Manual techniques;Passive range of motion;Scar mobilization;Dry needling;Taping;Vasopneumatic Device    PT Home Exercise Plan  Access Code: [email or web address]       Patient will benefit from skilled therapeutic intervention in order to improve the following deficits and impairments:  Pain, Increased fascial restricitons, Decreased scar mobility, Decreased strength, Decreased range of motion, Impaired flexibility, Difficulty walking  Visit Diagnosis: Acute pain of left knee - Plan: PT plan of care cert/re-cert  Muscle weakness (generalized) - Plan: PT plan of care cert/re-cert  Stiffness of left knee, not elsewhere classified - Plan: PT plan of care cert/re-cert     Problem List Patient Active Problem List   Diagnosis Date Noted  .  Epigastric abdominal tenderness 01/30/2016  . Dysphagia 01/30/2016  . GERD (gastroesophageal reflux disease) 01/30/2016  . Pericarditis 12/17/2015  . Constrictive pericarditis   . Chronic diastolic CHF (congestive heart failure) (HCC)   . Pulmonary hypertension (HCC)   . Precordial pain 12/05/2015  . Hypotension 12/05/2015  . Arterial hypotension   . Pericardial effusion 11/22/2015    Junious SilkJakki L Carra Brindley, PT 11/23/2018, 1:22 PM  [facility] [address], [address] [place], [place], [address] Phone: [phone]   Fax:  [phone]  Name: Alicia Mannsamela Williams MRN: [id number] Date of Birth: 10/28/1970

## 2018-11-22 NOTE — Patient Instructions (Signed)
Access Code: [email or web address]  URL: [email or web address]  Date: 11/22/2018  Prepared by: Jari Favre   Exercises  Supine Quad Set - 10 reps - 3 sets - 1x daily - 7x weekly  Seated Quad Set - 10 reps - 3 sets - 1x daily - 7x weekly  Supine Knee Flexion Self Mobilization - 10 reps - 1 sets - 3x daily - 7x weekly  Standing Hamstring Stretch with Step - 3 reps - 1 sets - 30 sec hold - 1x daily - 7x weekly  Standing Weight Shift - 10 reps - 3 sets - 1x daily - 7x weekly  Staggered Stance Forward Backward Weight Shift with Counter Support - 10 reps - 3 sets - 1x daily - 7x weekly

## 2018-11-23 NOTE — Addendum Note (Signed)
Addended by: Su Hoff on: 11/23/2018 01:23 PM   Modules accepted: Orders

## 2018-11-25 ENCOUNTER — Ambulatory Visit: Payer: No Typology Code available for payment source | Attending: Orthopedic Surgery

## 2018-11-25 ENCOUNTER — Other Ambulatory Visit: Payer: Self-pay

## 2018-11-25 DIAGNOSIS — M25662 Stiffness of left knee, not elsewhere classified: Secondary | ICD-10-CM | POA: Diagnosis present

## 2018-11-25 DIAGNOSIS — M25562 Pain in left knee: Secondary | ICD-10-CM | POA: Diagnosis present

## 2018-11-25 DIAGNOSIS — M6281 Muscle weakness (generalized): Secondary | ICD-10-CM | POA: Diagnosis not present

## 2018-11-25 NOTE — Therapy (Signed)
[facility] [address] [place], [place], [address] Phone: [phone]   Fax:  [phone]  Physical Therapy Treatment  Patient Details  Name: Alicia Wood MRN: [id number] Date of Birth: December 12, 1970 Referring Provider (PT): [name], MD   Encounter Date: 11/25/2018  PT End of Session - 11/25/18 1203    Visit Number  2    Date for PT Re-Evaluation  01/17/19    PT Start Time  1132    PT Stop Time  1225    PT Time Calculation (min)  53 min    Activity Tolerance  Patient tolerated treatment well    Behavior During Therapy  [facility] for tasks assessed/performed       Past Medical History:  Diagnosis Date  . Chest pain    a. Initially in setting of pericarditis in 11/2015; b. Ongoing cp despite prednisone, colchicine, ibuprofen,and narcotic therapy;  c. 12/2015 CTA Chest: No PE, stable small-mod pericardial effusion;  d. 12/2015 Cath: nl cors.  . Chronic diastolic CHF (congestive heart failure) ([place])    a. 11/2015 Echo: EF 32-67%, grade 2 diastolic dysfxn.  Marland Kitchen GERD (gastroesophageal reflux disease)   . Headache   . Hyperlipidemia   . Hypertension    Patient states she does not have hypertension.   . Pericardial effusion    a. 11/23/2015 Echo: mod pericardial effusion, no tamponade; b. 12/05/2015 Echo: small circumferential pericardial effusion; c. 12/06/2015 Cardiac MRI: pericardial thickening w/ circumferential late gad enhancement of pericardium and epicardium-->suspicious for subacute effusive constrictive pericarditis;  d. 12/19/2015 Echo: triv pericardial effusion;  e. 01/06/2016 CT Chest: small to mod pericard eff.  . Pericarditis    a. 11/2015 Cardiac MRI; EF 57%, mild MR, triv TR, mild pericardial effusion, thickening of pericardium focally up to 73mm w/ significant circumferential late gad enhancement of pericardium and epicardium sugg of subacute effusive constrictive pericarditis;  b. 12/2015 R Heart Cath: nl right  heart pressures w/o evidence of constrictive physiology.    Past Surgical History:  Procedure Laterality Date  . ABDOMINAL HYSTERECTOMY    . APPENDECTOMY    . CARDIAC CATHETERIZATION N/A 01/21/2016   Procedure: Right/Left Heart Cath and Coronary Angiography;  Surgeon: Burnell Blanks, MD;  Location: [facility] CV LAB;  Service: Cardiovascular;  Laterality: N/A;  . ESOPHAGOGASTRODUODENOSCOPY (EGD) WITH PROPOFOL N/A 01/30/2016   Procedure: ESOPHAGOGASTRODUODENOSCOPY (EGD) WITH PROPOFOL;  Surgeon: Wilford Corner, MD;  Location: [facility] ENDOSCOPY;  Service: Endoscopy;  Laterality: N/A;  . PARTIAL HYSTERECTOMY    . TUBAL LIGATION      There were no vitals filed for this visit.  Subjective Assessment - 11/25/18 1130    Subjective  My Lt knee is hurting.    Currently in Pain?  Yes    Pain Score  8     Pain Location  Knee    Pain Orientation  Left    Pain Descriptors / Indicators  Aching;Throbbing    Pain Type  Surgical pain    Pain Onset  1 to 4 weeks ago    Aggravating Factors   standing, bending knee    Pain Relieving Factors  ice, sleeping                       [facility] Adult PT Treatment/Exercise - 11/25/18 0001      Exercises   Exercises  Knee/Hip      Knee/Hip Exercises: Stretches   Active Hamstring Stretch  Left;2 reps;20 seconds  Gastroc Stretch  Left;5 reps;10 seconds    Gastroc Stretch Limitations  using rocker board      Knee/Hip Exercises: Aerobic   Nustep  Level 2x 8 minutes- legs only       Knee/Hip Exercises: Standing   Heel Raises  Both;2 sets;10 reps    Rocker Board  3 minutes    Other Standing Knee Exercises  weight shifting 3 ways x 1 minute each       Knee/Hip Exercises: Seated   Long Arc Quad  Strengthening;Left;10 reps    Heel Slides  AAROM;Left;20 reps    Heel Slides Limitations  using slider      Modalities   Modalities  Vasopneumatic      Vasopneumatic   Number Minutes Vasopneumatic   15 minutes    Vasopnuematic Location    Knee    Vasopneumatic Pressure  Medium    Vasopneumatic Temperature   3 snowflakes             PT Education - 11/25/18 1156    Education Details  Access Code: [email or web address]    Person(s) Educated  Patient    Methods  Explanation;Demonstration;Handout    Comprehension  Verbalized understanding;Returned demonstration       PT Short Term Goals - 11/23/18 1224      PT SHORT TERM GOAL #1   Title  ind with initial HEP    Time  4    Period  Weeks    Status  New    Target Date  12/21/18      PT SHORT TERM GOAL #2   Title  5/5 MMT Left knee flexion and extension with no pain so she can return to work related activities.    Time  4    Period  Weeks    Status  New    Target Date  12/21/18      PT SHORT TERM GOAL #3   Title  pt will be able to straighten Lt LE to neutral    Time  4    Period  Weeks    Status  New    Target Date  12/21/18        PT Long Term Goals - 11/23/18 1221      PT LONG TERM GOAL #1   Title  Pt will be able to stand and walk for at least one hour so she can return to work    Time  8    Period  Weeks    Status  New    Target Date  01/17/19      PT LONG TERM GOAL #2   Title  pt will be ind with advanced HEP    Time  8    Period  Weeks    Status  New    Target Date  01/17/19      PT LONG TERM GOAL #3   Title  Pt will have at least 120 degrees of knee flexion on Left to return to all functional movements.    Time  8    Period  Weeks    Status  New    Target Date  01/17/19      PT LONG TERM GOAL #4   Title  Pt will be 41% or less limited based on FOTO    Time  8    Period  Weeks    Status  New    Target Date  01/17/19  Plan - 11/25/18 1150    Clinical Impression Statement  Pt with first time follow-up after evaluation.  Pt with increased Lt knee pain and reports up to 8/10 pain reported.  Pt demonstrates mild antalgia without device and demonstrates significant improvement in weightbearing into the Lt LE in all directions.   Pt tolerated the addition of new HEP today. Pt will continue to benefit from skilled PT to improve strength, endurance, gait and edema management s/p Lt knee scope surgery.    PT Frequency  2x / week    PT Duration  8 weeks    PT Treatment/Interventions  ADLs/Self Care Home Management;Aquatic Therapy;Cryotherapy;Electrical Stimulation;Moist Heat;Therapeutic exercise;Therapeutic activities;Neuromuscular re-education;Patient/family education;Manual techniques;Passive range of motion;Scar mobilization;Dry needling;Taping;Vasopneumatic Device    PT Next Visit Plan  review HEP, continue Lt knee strength, flexibility, gait and edema management    PT Home Exercise Plan  Access Code: [email or web address]    Recommended Other Services  initial certificaiton is signed    Consulted and Agree with Plan of Care  Patient       Patient will benefit from skilled therapeutic intervention in order to improve the following deficits and impairments:  Pain, Increased fascial restricitons, Decreased scar mobility, Decreased strength, Decreased range of motion, Impaired flexibility, Difficulty walking  Visit Diagnosis: Muscle weakness (generalized)  Acute pain of left knee  Stiffness of left knee, not elsewhere classified     Problem List Patient Active Problem List   Diagnosis Date Noted  . Epigastric abdominal tenderness 01/30/2016  . Dysphagia 01/30/2016  . GERD (gastroesophageal reflux disease) 01/30/2016  . Pericarditis 12/17/2015  . Constrictive pericarditis   . Chronic diastolic CHF (congestive heart failure) (HCC)   . Pulmonary hypertension (HCC)   . Precordial pain 12/05/2015  . Hypotension 12/05/2015  . Arterial hypotension   . Pericardial effusion 11/22/2015    Lorrene Reid, PT 11/25/18 12:04 PM  [facility] [address] [place], [place], [address] Phone: [phone]   Fax:  [phone]  Name: Alicia Wood MRN: [id number] Date  of Birth: 1970-07-16

## 2018-11-25 NOTE — Patient Instructions (Signed)
Access Code: [email or web address]  URL: [email or web address]  Date: 11/25/2018  Prepared by: Sigurd Sos   Exercises   Seated Hamstring Stretch - 10 reps - 1 sets - 20 hold - 2x daily - 7x weekly Seated Long Arc Quad - 10 reps - 2 sets - 5 hold - 2x daily - 7x weekly

## 2018-11-30 ENCOUNTER — Encounter: Payer: Self-pay | Admitting: Physical Therapy

## 2018-11-30 ENCOUNTER — Ambulatory Visit: Payer: No Typology Code available for payment source | Admitting: Physical Therapy

## 2018-11-30 ENCOUNTER — Other Ambulatory Visit: Payer: Self-pay

## 2018-11-30 DIAGNOSIS — M25562 Pain in left knee: Secondary | ICD-10-CM

## 2018-11-30 DIAGNOSIS — M6281 Muscle weakness (generalized): Secondary | ICD-10-CM

## 2018-11-30 DIAGNOSIS — M25662 Stiffness of left knee, not elsewhere classified: Secondary | ICD-10-CM

## 2018-11-30 NOTE — Therapy (Signed)
[facility] [address], [place], [place], [address] Phone: [phone]   Fax:  [phone]  Physical Therapy Treatment  Patient Details  Name: Alicia Wood MRN: [id number] Date of Birth: 03-26-1970 Referring Provider (PT): [name], MD   Encounter Date: 11/30/2018  PT End of Session - 11/30/18 1236    Visit Number  3    Date for PT Re-Evaluation  01/17/19    PT Start Time  1234    PT Stop Time  1314    PT Time Calculation (min)  40 min    Activity Tolerance  Patient tolerated treatment well    Behavior During Therapy  [facility] for tasks assessed/performed       Past Medical History:  Diagnosis Date  . Chest pain    a. Initially in setting of pericarditis in 11/2015; b. Ongoing cp despite prednisone, colchicine, ibuprofen,and narcotic therapy;  c. 12/2015 CTA Chest: No PE, stable small-mod pericardial effusion;  d. 12/2015 Cath: nl cors.  . Chronic diastolic CHF (congestive heart failure) ([place])    a. 11/2015 Echo: EF 36-14%, grade 2 diastolic dysfxn.  Marland Kitchen GERD (gastroesophageal reflux disease)   . Headache   . Hyperlipidemia   . Hypertension    Patient states she does not have hypertension.   . Pericardial effusion    a. 11/23/2015 Echo: mod pericardial effusion, no tamponade; b. 12/05/2015 Echo: small circumferential pericardial effusion; c. 12/06/2015 Cardiac MRI: pericardial thickening w/ circumferential late gad enhancement of pericardium and epicardium-->suspicious for subacute effusive constrictive pericarditis;  d. 12/19/2015 Echo: triv pericardial effusion;  e. 01/06/2016 CT Chest: small to mod pericard eff.  . Pericarditis    a. 11/2015 Cardiac MRI; EF 57%, mild MR, triv TR, mild pericardial effusion, thickening of pericardium focally up to 23mm w/ significant circumferential late gad enhancement of pericardium and epicardium sugg of subacute effusive constrictive pericarditis;  b. 12/2015 R Heart Cath: nl right  heart pressures w/o evidence of constrictive physiology.    Past Surgical History:  Procedure Laterality Date  . ABDOMINAL HYSTERECTOMY    . APPENDECTOMY    . CARDIAC CATHETERIZATION N/A 01/21/2016   Procedure: Right/Left Heart Cath and Coronary Angiography;  Surgeon: Burnell Blanks, MD;  Location: [facility];  Service: Cardiovascular;  Laterality: N/A;  . ESOPHAGOGASTRODUODENOSCOPY (EGD) WITH PROPOFOL N/A 01/30/2016   Procedure: ESOPHAGOGASTRODUODENOSCOPY (EGD) WITH PROPOFOL;  Surgeon: Wilford Corner, MD;  Location: [facility] ENDOSCOPY;  Service: Endoscopy;  Laterality: N/A;  . PARTIAL HYSTERECTOMY    . TUBAL LIGATION      There were no vitals filed for this visit.  Subjective Assessment - 11/30/18 1237    Subjective  Pt states knee stiffens up 5-10 min after stiting.    Patient Stated Goals  be able to walk without a limp    Currently in Pain?  Yes    Pain Score  8     Pain Location  Knee    Pain Orientation  Left    Pain Descriptors / Indicators  Aching   stiff        [facility] PT Assessment - 11/30/18 0001      ROM / Strength   AROM / PROM / Strength  AROM;PROM      AROM   AROM Assessment Site  Knee    Right/Left Knee  Left    Left Knee Extension  0    Left Knee Flexion  90      PROM   Left Knee  Extension  0    Left Knee Flexion  105                   [facility] Adult PT Treatment/Exercise - 11/30/18 0001      Ambulation/Gait   Ambulation/Gait  Yes    Ambulation Distance (Feet)  60 Feet    Assistive device  None    Gait Comments  equal stance time and step length today      Knee/Hip Exercises: Stretches   Gastroc Stretch  2 reps;Both;30 seconds    Gastroc Stretch Limitations  on ground      Knee/Hip Exercises: Aerobic   Nustep  Level 3x 8 minutes- legs only       Knee/Hip Exercises: Standing   Heel Raises  Both;2 sets;10 reps    Rocker Board  3 minutes      Knee/Hip Exercises: Seated   Long Arc Quad  Strengthening;20 reps;Left     Heel Slides  AAROM;Left;20 reps    Heel Slides Limitations  using slider      Knee/Hip Exercises: Supine   Quad Sets  Strengthening;Left;10 reps      Manual Therapy   Manual Therapy  Passive ROM    Passive ROM  into flexion              PT Education - 11/30/18 1318    Education Details  HEP    Person(s) Educated  Patient    Methods  Explanation;Demonstration;Handout    Comprehension  Verbalized understanding;Returned demonstration       PT Short Term Goals - 11/23/18 1224      PT SHORT TERM GOAL #1   Title  ind with initial HEP    Time  4    Period  Weeks    Status  New    Target Date  12/21/18      PT SHORT TERM GOAL #2   Title  5/5 MMT Left knee flexion and extension with no pain so she can return to work related activities.    Time  4    Period  Weeks    Status  New    Target Date  12/21/18      PT SHORT TERM GOAL #3   Title  pt will be able to straighten Lt LE to neutral    Time  4    Period  Weeks    Status  New    Target Date  12/21/18        PT Long Term Goals - 11/23/18 1221      PT LONG TERM GOAL #1   Title  Pt will be able to stand and walk for at least one hour so she can return to work    Time  8    Period  Weeks    Status  New    Target Date  01/17/19      PT LONG TERM GOAL #2   Title  pt will be ind with advanced HEP    Time  8    Period  Weeks    Status  New    Target Date  01/17/19      PT LONG TERM GOAL #3   Title  Pt will have at least 120 degrees of knee flexion on Left to return to all functional movements.    Time  8    Period  Weeks    Status  New    Target Date  01/17/19  PT LONG TERM GOAL #4   Title  Pt will be 41% or less limited based on FOTO    Time  8    Period  Weeks    Status  New    Target Date  01/17/19            Plan - 11/30/18 1321    Clinical Impression Statement  Patient showed improvements in both gait and ROM today. She still has antalgic gait upon standing, but demonstrated normal  stance time bil once ambulating. PROM improved to 105 and active extension is 0. Held vaso per pt request.    PT Frequency  2x / week    PT Duration  8 weeks    PT Treatment/Interventions  ADLs/Self Care Home Management;Aquatic Therapy;Cryotherapy;Electrical Stimulation;Moist Heat;Therapeutic exercise;Therapeutic activities;Neuromuscular re-education;Patient/family education;Manual techniques;Passive range of motion;Scar mobilization;Dry needling;Taping;Vasopneumatic Device    PT Next Visit Plan  continue Lt knee strength, flexibility, gait and edema management    PT Home Exercise Plan  Access Code: [email or web address]    Consulted and Agree with Plan of Care  Patient       Patient will benefit from skilled therapeutic intervention in order to improve the following deficits and impairments:  Pain, Increased fascial restricitons, Decreased scar mobility, Decreased strength, Decreased range of motion, Impaired flexibility, Difficulty walking  Visit Diagnosis: Muscle weakness (generalized)  Acute pain of left knee  Stiffness of left knee, not elsewhere classified     Problem List Patient Active Problem List   Diagnosis Date Noted  . Epigastric abdominal tenderness 01/30/2016  . Dysphagia 01/30/2016  . GERD (gastroesophageal reflux disease) 01/30/2016  . Pericarditis 12/17/2015  . Constrictive pericarditis   . Chronic diastolic CHF (congestive heart failure) (HCC)   . Pulmonary hypertension (HCC)   . Precordial pain 12/05/2015  . Hypotension 12/05/2015  . Arterial hypotension   . Pericardial effusion 11/22/2015   Solon Palm PT 11/30/2018, 1:25 PM  [facility] [address], [address] [place], [place], [address] Phone: [phone]   Fax:  [phone]  Name: Alicia Wood MRN: [id number] Date of Birth: [birth year]

## 2018-11-30 NOTE — Patient Instructions (Signed)
  Standing Gastroc Stretch at [employer] - 3 reps - 1 sets - 30 SEC hold - 2x daily - 7x weekly

## 2018-12-02 ENCOUNTER — Ambulatory Visit: Payer: No Typology Code available for payment source

## 2018-12-02 ENCOUNTER — Other Ambulatory Visit: Payer: Self-pay

## 2018-12-02 DIAGNOSIS — M25662 Stiffness of left knee, not elsewhere classified: Secondary | ICD-10-CM

## 2018-12-02 DIAGNOSIS — M25562 Pain in left knee: Secondary | ICD-10-CM

## 2018-12-02 DIAGNOSIS — M6281 Muscle weakness (generalized): Secondary | ICD-10-CM | POA: Diagnosis not present

## 2018-12-02 NOTE — Therapy (Signed)
[facility] [address] [place], [place], [address] Phone: [phone]   Fax:  [phone]  Physical Therapy Treatment  Patient Details  Name: Alicia Wood MRN: [id number] Date of Birth: 17-Jun-1970 Referring Provider (PT): Malon Kindle, MD   Encounter Date: 12/02/2018  PT End of Session - 12/02/18 1208    Visit Number  4    Date for PT Re-Evaluation  01/17/19    Authorization Type  [facility]    PT Start Time  1131    PT Stop Time  1216    PT Time Calculation (min)  45 min    Activity Tolerance  Patient tolerated treatment well    Behavior During Therapy  [facility] for tasks assessed/performed       Past Medical History:  Diagnosis Date  . Chest pain    a. Initially in setting of pericarditis in 11/2015; b. Ongoing cp despite prednisone, colchicine, ibuprofen,and narcotic therapy;  c. 12/2015 CTA Chest: No PE, stable small-mod pericardial effusion;  d. 12/2015 Cath: nl cors.  . Chronic diastolic CHF (congestive heart failure) (HCC)    a. 11/2015 Echo: EF 55-60%, grade 2 diastolic dysfxn.  Marland Kitchen GERD (gastroesophageal reflux disease)   . Headache   . Hyperlipidemia   . Hypertension    Patient states she does not have hypertension.   . Pericardial effusion    a. 11/23/2015 Echo: mod pericardial effusion, no tamponade; b. 12/05/2015 Echo: small circumferential pericardial effusion; c. 12/06/2015 Cardiac MRI: pericardial thickening w/ circumferential late gad enhancement of pericardium and epicardium-->suspicious for subacute effusive constrictive pericarditis;  d. 12/19/2015 Echo: triv pericardial effusion;  e. 01/06/2016 CT Chest: small to mod pericard eff.  . Pericarditis    a. 11/2015 Cardiac MRI; EF 57%, mild MR, triv TR, mild pericardial effusion, thickening of pericardium focally up to 66mm w/ significant circumferential late gad enhancement of pericardium and epicardium sugg of subacute effusive constrictive pericarditis;  b.  12/2015 R Heart Cath: nl right heart pressures w/o evidence of constrictive physiology.    Past Surgical History:  Procedure Laterality Date  . ABDOMINAL HYSTERECTOMY    . APPENDECTOMY    . CARDIAC CATHETERIZATION N/A 01/21/2016   Procedure: Right/Left Heart Cath and Coronary Angiography;  Surgeon: Kathleene Hazel, MD;  Location: [facility] INVASIVE [facility] LAB;  Service: Cardiovascular;  Laterality: N/A;  . ESOPHAGOGASTRODUODENOSCOPY (EGD) WITH PROPOFOL N/A 01/30/2016   Procedure: ESOPHAGOGASTRODUODENOSCOPY (EGD) WITH PROPOFOL;  Surgeon: [name], MD;  Location: [facility] ENDOSCOPY;  Service: Endoscopy;  Laterality: N/A;  . PARTIAL HYSTERECTOMY    . TUBAL LIGATION      There were no vitals filed for this visit.  Subjective Assessment - 12/02/18 1126    Subjective  I am stiff after I have been still.   I have been walking short distances.    Currently in Pain?  Yes    Pain Score  6     Pain Location  Knee    Pain Orientation  Left    Pain Descriptors / Indicators  Aching;Tightness    Pain Type  Surgical pain    Pain Onset  1 to 4 weeks ago    Pain Frequency  Constant                       [facility] Adult PT Treatment/Exercise - 12/02/18 0001      Knee/Hip Exercises: Stretches   Active Hamstring Stretch  Left;2 reps;20 seconds      Knee/Hip  Exercises: Aerobic   Nustep  Level 3x 8 minutes- legs only       Knee/Hip Exercises: Standing   Heel Raises  Both;2 sets;10 reps    Forward Step Up  Left;2 sets;10 reps;Hand Hold: 1;Step Height: 6"    Step Down  Left;Both;10 reps;Hand Hold: 2;Step Height: 4"    Rocker Board  3 minutes    Other Standing Knee Exercises  weight shifting medial lateral on black pad x 1.5 minutes       Knee/Hip Exercises: Seated   Long Arc Quad  Strengthening;20 reps;Left    Hamstring Curl  Strengthening;Left;2 sets;10 reps    Hamstring Limitations  yellow theraband      Manual Therapy   Manual Therapy  Soft tissue mobilization;Myofascial release     Manual therapy comments  Lt quads, hamstrings and gastroc insertions               PT Short Term Goals - 12/02/18 1130      PT SHORT TERM GOAL #1   Title  ind with initial HEP    Status  Achieved      PT SHORT TERM GOAL #3   Title  pt will be able to straighten Lt LE to neutral    Status  Achieved        PT Long Term Goals - 11/23/18 1221      PT LONG TERM GOAL #1   Title  Pt will be able to stand and walk for at least one hour so she can return to work    Time  8    Period  Weeks    Status  New    Target Date  01/17/19      PT LONG TERM GOAL #2   Title  pt will be ind with advanced HEP    Time  8    Period  Weeks    Status  New    Target Date  01/17/19      PT LONG TERM GOAL #3   Title  Pt will have at least 120 degrees of knee flexion on Left to return to all functional movements.    Time  8    Period  Weeks    Status  New    Target Date  01/17/19      PT LONG TERM GOAL #4   Title  Pt will be 41% or less limited based on FOTO    Time  8    Period  Weeks    Status  New    Target Date  01/17/19            Plan - 12/02/18 1135    Clinical Impression Statement  Pt has been compliant with HEP and is walking frequently at home to help build endurance.  Pt demonstrates improved Lt knee extension and minimal gait abnormality. Pt with reduced eccentric control with descending steps and verbal cues were required for alignment and stability.  Pt will continue to benefit from skilled PT to address Lt knee strength, flexibility, gait and edema s/p arthroscopic surgery.    PT Treatment/Interventions  ADLs/Self Care Home Management;Aquatic Therapy;Cryotherapy;Electrical Stimulation;Moist Heat;Therapeutic exercise;Therapeutic activities;Neuromuscular re-education;Patient/family education;Manual techniques;Passive range of motion;Scar mobilization;Dry needling;Taping;Vasopneumatic Device    PT Next Visit Plan  continue Lt knee strength, flexibility, gait and edema  management    PT Home Exercise Plan  Access Code: [email or web address]    Consulted and Agree with Plan of Care  Patient  Patient will benefit from skilled therapeutic intervention in order to improve the following deficits and impairments:  Pain, Increased fascial restricitons, Decreased scar mobility, Decreased strength, Decreased range of motion, Impaired flexibility, Difficulty walking  Visit Diagnosis: Muscle weakness (generalized)  Acute pain of left knee  Stiffness of left knee, not elsewhere classified     Problem List Patient Active Problem List   Diagnosis Date Noted  . Epigastric abdominal tenderness 01/30/2016  . Dysphagia 01/30/2016  . GERD (gastroesophageal reflux disease) 01/30/2016  . Pericarditis 12/17/2015  . Constrictive pericarditis   . Chronic diastolic CHF (congestive heart failure) ([place])   . Pulmonary hypertension ([place])   . Precordial pain 12/05/2015  . Hypotension 12/05/2015  . Arterial hypotension   . Pericardial effusion 11/22/2015    Sigurd Sos, PT 12/02/18 12:10 PM  [facility] [address] [place], [place], [address] Phone: [phone]   Fax:  [phone]  Name: Ghazal Pevey MRN: [id number] Date of Birth: [birth year]

## 2018-12-06 ENCOUNTER — Ambulatory Visit: Payer: No Typology Code available for payment source

## 2018-12-08 ENCOUNTER — Ambulatory Visit: Payer: No Typology Code available for payment source

## 2018-12-13 ENCOUNTER — Ambulatory Visit: Payer: No Typology Code available for payment source

## 2018-12-13 ENCOUNTER — Other Ambulatory Visit: Payer: Self-pay

## 2018-12-13 DIAGNOSIS — M25562 Pain in left knee: Secondary | ICD-10-CM

## 2018-12-13 DIAGNOSIS — M6281 Muscle weakness (generalized): Secondary | ICD-10-CM | POA: Diagnosis not present

## 2018-12-13 DIAGNOSIS — M25662 Stiffness of left knee, not elsewhere classified: Secondary | ICD-10-CM

## 2018-12-13 NOTE — Therapy (Signed)
[facility] [address], [address] [place], [place], [address] Phone: [phone]   Fax:  [phone]  Physical Therapy Treatment  Patient Details  Name: Alicia Wood MRN: [id number] Date of Birth: 02-24-1971 Referring Provider (PT): Malon Kindle, MD   Encounter Date: 12/13/2018  PT End of Session - 12/13/18 1138    Visit Number  5    Date for PT Re-Evaluation  01/17/19    Authorization Type  [facility]    PT Start Time  1057    PT Stop Time  1136    PT Time Calculation (min)  39 min    Activity Tolerance  Patient tolerated treatment well    Behavior During Therapy  [facility] for tasks assessed/performed       Past Medical History:  Diagnosis Date  . Chest pain    a. Initially in setting of pericarditis in 11/2015; b. Ongoing cp despite prednisone, colchicine, ibuprofen,and narcotic therapy;  c. 12/2015 CTA Chest: No PE, stable small-mod pericardial effusion;  d. 12/2015 Cath: nl cors.  . Chronic diastolic CHF (congestive heart failure) (HCC)    a. 11/2015 Echo: EF 55-60%, grade 2 diastolic dysfxn.  Marland Kitchen GERD (gastroesophageal reflux disease)   . Headache   . Hyperlipidemia   . Hypertension    Patient states she does not have hypertension.   . Pericardial effusion    a. 11/23/2015 Echo: mod pericardial effusion, no tamponade; b. 12/05/2015 Echo: small circumferential pericardial effusion; c. 12/06/2015 Cardiac MRI: pericardial thickening w/ circumferential late gad enhancement of pericardium and epicardium-->suspicious for subacute effusive constrictive pericarditis;  d. 12/19/2015 Echo: triv pericardial effusion;  e. 01/06/2016 CT Chest: small to mod pericard eff.  . Pericarditis    a. 11/2015 Cardiac MRI; EF 57%, mild MR, triv TR, mild pericardial effusion, thickening of pericardium focally up to 77mm w/ significant circumferential late gad enhancement of pericardium and epicardium sugg of subacute effusive constrictive pericarditis;  b.  12/2015 R Heart Cath: nl right heart pressures w/o evidence of constrictive physiology.    Past Surgical History:  Procedure Laterality Date  . ABDOMINAL HYSTERECTOMY    . APPENDECTOMY    . CARDIAC CATHETERIZATION N/A 01/21/2016   Procedure: Right/Left Heart Cath and Coronary Angiography;  Surgeon: Kathleene Hazel, MD;  [facility] INVASIVE CV LAB;  Service: Cardiovascular;  Laterality: N/A;  . ESOPHAGOGASTRODUODENOSCOPY (EGD) WITH PROPOFOL N/A 01/30/2016   Procedure: ESOPHAGOGASTRODUODENOSCOPY (EGD) WITH PROPOFOL;  Surgeon: [name], MD;  Location: [facility] ENDOSCOPY;  Service: Endoscopy;  Laterality: N/A;  . PARTIAL HYSTERECTOMY    . TUBAL LIGATION      There were no vitals filed for this visit.  Subjective Assessment - 12/13/18 1056    Subjective  My knee is killing me. I didn't come last week because my knee pain was a 10/10.  I see the MD this week.    Currently in Pain?  Yes    Pain Score  10-Worst pain ever    Pain Location  Knee    Pain Orientation  Left    Pain Descriptors / Indicators  Tightness;Sharp;Discomfort    Pain Type  Surgical pain    Pain Onset  More than a month ago    Pain Frequency  Constant    Aggravating Factors   everything, negotiating steps, walking to the mailbox    Pain Relieving Factors  rest, bending to reduce stiffness         [facility] PT Assessment - 12/13/18 0001  Assessment   Medical Diagnosis  Z51.89 (ICD-10-CM) - Encounter for other specified aftercare    Referring Provider (PT)  [name], MD    Onset Date/Surgical Date  11/03/18      AROM   AROM Assessment Site  Knee    Right/Left Knee  Left    Left Knee Extension  0    Left Knee Flexion  100      PROM   Left Knee Extension  0    Left Knee Flexion  110      Strength   Left Knee Flexion  4+/5    Left Knee Extension  5/5                   [facility] Adult PT Treatment/Exercise - 12/13/18 0001      Knee/Hip Exercises: Stretches   Active Hamstring  Stretch  Left;2 reps;20 seconds    Gastroc Stretch  2 reps;Both;30 seconds    Gastroc Stretch Limitations  using rocker board      Knee/Hip Exercises: Aerobic   Nustep  Level 1x 8 minutes- legs only       Knee/Hip Exercises: Standing   Heel Raises  Both;2 sets;10 reps    Other Standing Knee Exercises  weight shifting medial lateral on black pad x 1.5 minutes       Knee/Hip Exercises: Seated   Long Arc Quad  Strengthening;20 reps;Left    Heel Slides  AAROM;Left;20 reps    Heel Slides Limitations  using slider    Hamstring Curl  Strengthening;Left;2 sets;10 reps    Hamstring Limitations  red theraband      Knee/Hip Exercises: Supine   Straight Leg Raises  Strengthening;Left;3 sets;5 reps               PT Short Term Goals - 12/13/18 1102      PT SHORT TERM GOAL #1   Title  ind with initial HEP    Status  Achieved        PT Long Term Goals - 12/13/18 1101      PT LONG TERM GOAL #1   Title  Pt will be able to stand and walk for at least one hour so she can return to work    Baseline  10-15 minutes with antalgia    Time  8    Status  On-going      PT LONG TERM GOAL #2   Title  pt will be ind with advanced HEP    Baseline  independent with gentle exercise as tolerated    Time  8    Status  On-going            Plan - 12/13/18 1117    Clinical Impression Statement  Pt entered the clinic with report of 10/10 Lt knee pain 4 days over the past week.  Pt has not been able to move around as much at home due to this pain.  Standing and walking are limited to 10-15 minutes and demonstrates antalgia.  Lt knee strength is 5/5 extension (with pain), and 4+/5 flexion.  Lt knee P/ROM is improved to 0-110 degrees.  Pt will see MD this week to discuss high pain levels.  Pt is making improvements regarding strength and mobility of the Lt knee s/p surgery.  Pt will continue to benefit from skilled PT for Lt knee ROM, strength, gait and proprioception.    PT Frequency  2x / week     PT Duration  8  weeks    PT Treatment/Interventions  ADLs/Self Care Home Management;Aquatic Therapy;Cryotherapy;Electrical Stimulation;Moist Heat;Therapeutic exercise;Therapeutic activities;Neuromuscular re-education;Patient/family education;Manual techniques;Passive range of motion;Scar mobilization;Dry needling;Taping;Vasopneumatic Device    PT Next Visit Plan  continue Lt knee strength, flexibility, gait and edema management.  See what MD says    PT Home Exercise Plan  Access Code: [email or web address]    Consulted and Agree with Plan of Care  Patient       Patient will benefit from skilled therapeutic intervention in order to improve the following deficits and impairments:  Pain, Increased fascial restricitons, Decreased scar mobility, Decreased strength, Decreased range of motion, Impaired flexibility, Difficulty walking  Visit Diagnosis: Muscle weakness (generalized)  Acute pain of left knee  Stiffness of left knee, not elsewhere classified     Problem List Patient Active Problem List   Diagnosis Date Noted  . Epigastric abdominal tenderness 01/30/2016  . Dysphagia 01/30/2016  . GERD (gastroesophageal reflux disease) 01/30/2016  . Pericarditis 12/17/2015  . Constrictive pericarditis   . Chronic diastolic CHF (congestive heart failure) (HCC)   . Pulmonary hypertension (HCC)   . Precordial pain 12/05/2015  . Hypotension 12/05/2015  . Arterial hypotension   . Pericardial effusion 11/22/2015     Lorrene Reid, PT 12/13/18 11:40 AM  [facility] [address] [place], [place], [address] Phone: [phone]   Fax:  [phone]  Name: Demiyah Fischbach MRN: [id number] Date of Birth: 1970-05-07

## 2018-12-15 ENCOUNTER — Ambulatory Visit: Payer: No Typology Code available for payment source

## 2018-12-20 ENCOUNTER — Other Ambulatory Visit: Payer: Self-pay

## 2018-12-20 ENCOUNTER — Ambulatory Visit: Payer: No Typology Code available for payment source | Admitting: Physical Therapy

## 2018-12-20 ENCOUNTER — Encounter: Payer: Self-pay | Admitting: Physical Therapy

## 2018-12-20 DIAGNOSIS — M6281 Muscle weakness (generalized): Secondary | ICD-10-CM | POA: Diagnosis not present

## 2018-12-20 DIAGNOSIS — M25562 Pain in left knee: Secondary | ICD-10-CM

## 2018-12-20 DIAGNOSIS — M25662 Stiffness of left knee, not elsewhere classified: Secondary | ICD-10-CM

## 2018-12-20 NOTE — Therapy (Signed)
[facility] [address] [place], [place], [address] Phone: [phone]   Fax:  [phone]  Physical Therapy Treatment  Patient Details  Name: Alicia Wood MRN: [id number] Date of Birth: [birth year] Referring Provider (PT): [name], MD   Encounter Date: 12/20/2018  PT End of Session - 12/20/18 1400    Visit Number  6    Date for PT Re-Evaluation  01/17/19    Authorization Type  [facility]    PT Start Time  1400    PT Stop Time  1439    PT Time Calculation (min)  39 min    Activity Tolerance  Patient tolerated treatment well    Behavior During Therapy  [facility] for tasks assessed/performed       Past Medical History:  Diagnosis Date  . Chest pain    a. Initially in setting of pericarditis in 11/2015; b. Ongoing cp despite prednisone, colchicine, ibuprofen,and narcotic therapy;  c. 12/2015 CTA Chest: No PE, stable small-mod pericardial effusion;  d. 12/2015 Cath: nl cors.  . Chronic diastolic CHF (congestive heart failure) ([place])    a. 11/2015 Echo: EF 40-08%, grade 2 diastolic dysfxn.  Marland Kitchen GERD (gastroesophageal reflux disease)   . Headache   . Hyperlipidemia   . Hypertension    Patient states she does not have hypertension.   . Pericardial effusion    a. 11/23/2015 Echo: mod pericardial effusion, no tamponade; b. 12/05/2015 Echo: small circumferential pericardial effusion; c. 12/06/2015 Cardiac MRI: pericardial thickening w/ circumferential late gad enhancement of pericardium and epicardium-->suspicious for subacute effusive constrictive pericarditis;  d. 12/19/2015 Echo: triv pericardial effusion;  e. 01/06/2016 CT Chest: small to mod pericard eff.  . Pericarditis    a. 11/2015 Cardiac MRI; EF 57%, mild MR, triv TR, mild pericardial effusion, thickening of pericardium focally up to 37mm w/ significant circumferential late gad enhancement of pericardium and epicardium sugg of subacute effusive constrictive pericarditis;  b.  12/2015 R Heart Cath: nl right heart pressures w/o evidence of constrictive physiology.    Past Surgical History:  Procedure Laterality Date  . ABDOMINAL HYSTERECTOMY    . APPENDECTOMY    . CARDIAC CATHETERIZATION N/A 01/21/2016   Procedure: Right/Left Heart Cath and Coronary Angiography;  Surgeon: Burnell Blanks, MD;  Location: [place] [facility];  Service: Cardiovascular;  Laterality: N/A;  . ESOPHAGOGASTRODUODENOSCOPY (EGD) WITH PROPOFOL N/A 01/30/2016   Procedure: ESOPHAGOGASTRODUODENOSCOPY (EGD) WITH PROPOFOL;  Surgeon: Wilford Corner, MD;  Location: [facility];  Service: Endoscopy;  Laterality: N/A;  . PARTIAL HYSTERECTOMY    . TUBAL LIGATION      There were no vitals filed for this visit.  Subjective Assessment - 12/20/18 1406    Subjective  I saw the MD and got a cortisone injection.    Currently in Pain?  Yes    Pain Score  5     Pain Location  Knee    Pain Orientation  Left    Pain Descriptors / Indicators  Tightness;Sharp    Pain Type  Surgical pain    Pain Onset  More than a month ago    Pain Frequency  Constant    Aggravating Factors   sitting or lying down too long                       [facility] Adult PT Treatment/Exercise - 12/20/18 0001      Knee/Hip Exercises: Stretches   Active Hamstring Stretch  Left;2 reps;20 seconds  Gastroc Stretch  2 reps;Both;30 seconds    Gastroc Stretch Limitations  using rocker board      Knee/Hip Exercises: Aerobic   Recumbent Bike  L1 x 5 min   PT present for status update     Knee/Hip Exercises: Machines for Strengthening   Cybex Leg Press  seat 9; back 4 - Lt LE only 30# 2x10      Knee/Hip Exercises: Standing   Heel Raises  Both;2 sets;10 reps   standing on black foam pad   Functional Squat  10 reps    Functional Squat Limitations  quarter squat; cues to sit back; needs bilat UE support;     Other Standing Knee Exercises  weight shifting medial lateral on black pad x 1.5 minutes       Knee/Hip  Exercises: Seated   Long Arc Quad  Strengthening;20 reps;Left   5 sec hold; add 1.5# weight     Knee/Hip Exercises: Supine   Short Arc Quad Sets  Strengthening;Left;20 reps    Heel Slides  Strengthening;Left;20 reps               PT Short Term Goals - 12/13/18 1102      PT SHORT TERM GOAL #1   Title  ind with initial HEP    Status  Achieved        PT Long Term Goals - 12/13/18 1101      PT LONG TERM GOAL #1   Title  Pt will be able to stand and walk for at least one hour so she can return to work    Baseline  10-15 minutes with antalgia    Time  8    Status  On-going      PT LONG TERM GOAL #2   Title  pt will be ind with advanced HEP    Baseline  independent with gentle exercise as tolerated    Time  8    Status  On-going            Plan - 12/20/18 1434    Clinical Impression Statement  Pt did well with exercises today doing several new things.  She was able to tolerate full revolutions on recumbant bike.  She also did well with leg press able to do 30# with LtLE. Pt seems to have less inflammation and pain overall after getting cortisone injection.  She will benefit form skilled PT to work on strengthening the knee joint for improved functional and work related activities such as getting up and down stairs.    PT Treatment/Interventions  ADLs/Self Care Home Management;Aquatic Therapy;Cryotherapy;Electrical Stimulation;Moist Heat;Therapeutic exercise;Therapeutic activities;Neuromuscular re-education;Patient/family education;Manual techniques;Passive range of motion;Scar mobilization;Dry needling;Taping;Vasopneumatic Device    PT Next Visit Plan  continue Lt knee strength, flexibility, gait and edema management    PT Home Exercise Plan  Access Code: [email or web address]    Consulted and Agree with Plan of Care  Patient       Patient will benefit from skilled therapeutic intervention in order to improve the following deficits and impairments:  Pain, Increased fascial  restricitons, Decreased scar mobility, Decreased strength, Decreased range of motion, Impaired flexibility, Difficulty walking  Visit Diagnosis: Muscle weakness (generalized)  Acute pain of left knee  Stiffness of left knee, not elsewhere classified     Problem List Patient Active Problem List   Diagnosis Date Noted  . Epigastric abdominal tenderness 01/30/2016  . Dysphagia 01/30/2016  . GERD (gastroesophageal reflux disease) 01/30/2016  . Pericarditis 12/17/2015  . Constrictive  pericarditis   . Chronic diastolic CHF (congestive heart failure) (HCC)   . Pulmonary hypertension (HCC)   . Precordial pain 12/05/2015  . Hypotension 12/05/2015  . Arterial hypotension   . Pericardial effusion 11/22/2015    Junious Silk, PT 12/20/2018, 2:43 PM  [facility] [address], [address] [place], [place], [address] Phone: [phone]   Fax:  [phone]  Name: Alicia Wood MRN: [id number] Date of Birth: 11/13/1970

## 2018-12-23 ENCOUNTER — Encounter: Payer: Self-pay | Admitting: Physical Therapy

## 2018-12-23 ENCOUNTER — Other Ambulatory Visit: Payer: Self-pay

## 2018-12-23 ENCOUNTER — Ambulatory Visit: Payer: No Typology Code available for payment source | Attending: Orthopedic Surgery | Admitting: Physical Therapy

## 2018-12-23 DIAGNOSIS — M25562 Pain in left knee: Secondary | ICD-10-CM | POA: Insufficient documentation

## 2018-12-23 DIAGNOSIS — M6281 Muscle weakness (generalized): Secondary | ICD-10-CM | POA: Diagnosis not present

## 2018-12-23 DIAGNOSIS — M25662 Stiffness of left knee, not elsewhere classified: Secondary | ICD-10-CM | POA: Insufficient documentation

## 2018-12-23 NOTE — Therapy (Signed)
[facility] [address] [place], [place], [address] Phone: [phone]   Fax:  [phone]  Physical Therapy Treatment  Patient Details  Name: Linea Calles MRN: [id number] Date of Birth: [birth year] Referring Provider (PT): [name], MD   Encounter Date: 12/23/2018  PT End of Session - 12/23/18 1141    Visit Number  7    Date for PT Re-Evaluation  01/17/19    Authorization Type  [facility]    PT Start Time  1138    PT Stop Time  1220    PT Time Calculation (min)  42 min    Activity Tolerance  Patient tolerated treatment well    Behavior During Therapy  [facility] for tasks assessed/performed       Past Medical History:  Diagnosis Date  . Chest pain    a. Initially in setting of pericarditis in 11/2015; b. Ongoing cp despite prednisone, colchicine, ibuprofen,and narcotic therapy;  c. 12/2015 CTA Chest: No PE, stable small-mod pericardial effusion;  d. 12/2015 Cath: nl cors.  . Chronic diastolic CHF (congestive heart failure) ([place])    a. 11/2015 Echo: EF 35-00%, grade 2 diastolic dysfxn.  Marland Kitchen GERD (gastroesophageal reflux disease)   . Headache   . Hyperlipidemia   . Hypertension    Patient states she does not have hypertension.   . Pericardial effusion    a. 11/23/2015 Echo: mod pericardial effusion, no tamponade; b. 12/05/2015 Echo: small circumferential pericardial effusion; c. 12/06/2015 Cardiac MRI: pericardial thickening w/ circumferential late gad enhancement of pericardium and epicardium-->suspicious for subacute effusive constrictive pericarditis;  d. 12/19/2015 Echo: triv pericardial effusion;  e. 01/06/2016 CT Chest: small to mod pericard eff.  . Pericarditis    a. 11/2015 Cardiac MRI; EF 57%, mild MR, triv TR, mild pericardial effusion, thickening of pericardium focally up to 26mm w/ significant circumferential late gad enhancement of pericardium and epicardium sugg of subacute effusive constrictive pericarditis;  b.  12/2015 R Heart Cath: nl right heart pressures w/o evidence of constrictive physiology.    Past Surgical History:  Procedure Laterality Date  . ABDOMINAL HYSTERECTOMY    . APPENDECTOMY    . CARDIAC CATHETERIZATION N/A 01/21/2016   Procedure: Right/Left Heart Cath and Coronary Angiography;  Surgeon: Burnell Blanks, MD;  Location: [place] [facility];  Service: Cardiovascular;  Laterality: N/A;  . ESOPHAGOGASTRODUODENOSCOPY (EGD) WITH PROPOFOL N/A 01/30/2016   Procedure: ESOPHAGOGASTRODUODENOSCOPY (EGD) WITH PROPOFOL;  Surgeon: Wilford Corner, MD;  Location: [facility] ENDOSCOPY;  Service: Endoscopy;  Laterality: N/A;  . PARTIAL HYSTERECTOMY    . TUBAL LIGATION      There were no vitals filed for this visit.  Subjective Assessment - 12/23/18 1443    Subjective  Doing better and having less pain. Pt did not mention pain today.    Patient Stated Goals  be able to walk without a limp    Currently in Pain?  No/denies                       [facility] Adult PT Treatment/Exercise - 12/23/18 0001      Knee/Hip Exercises: Aerobic   Nustep  L1 x 5 min; L 4 x 3 min LE only - PT present for status update   seat 10     Knee/Hip Exercises: Machines for Strengthening   Cybex Leg Press  seat 9; back 4 - Lt LE only 40# 3x15      Knee/Hip Exercises: Standing   Heel Raises  Both;2 sets;10 reps   standing on black foam pad   Knee Flexion  Strengthening;Left;20 reps   2.5#   Forward Step Up  Left;2 sets;10 reps;Hand Hold: 1;Step Height: 6"    Functional Squat  20 reps    Functional Squat Limitations  quarter squat; cues to sit back; needs bilat UE support;     Other Standing Knee Exercises  weight shifting medial lateral on black pad x 2 minutes       Knee/Hip Exercises: Seated   Long Arc Quad  Strengthening;20 reps;Left   2.5#    Heel Slides  AAROM;Left;20 reps    Heel Slides Limitations  using slider               PT Short Term Goals - 12/23/18 1444      PT SHORT  TERM GOAL #2   Title  5/5 MMT Left knee flexion and extension with no pain so she can return to work related activities.    Baseline  able to do against resistance without pain    Status  On-going        PT Long Term Goals - 12/13/18 1101      PT LONG TERM GOAL #1   Title  Pt will be able to stand and walk for at least one hour so she can return to work    Baseline  10-15 minutes with antalgia    Time  8    Status  On-going      PT LONG TERM GOAL #2   Title  pt will be ind with advanced HEP    Baseline  independent with gentle exercise as tolerated    Time  8    Status  On-going            Plan - 12/23/18 1220    Clinical Impression Statement  Pt did well with increased resistance and reps today.  Pt felt fatigued after he session today.  She continues to have difficulty with stairs and demonstrates increased trunk flexion and bilat UE assist when doing step ups.  Pt was able to increase resistance on leg press.  Pt will continue to benefit from skilled PT to progress LE strength and return to maximum function.    PT Treatment/Interventions  ADLs/Self Care Home Management;Aquatic Therapy;Cryotherapy;Electrical Stimulation;Moist Heat;Therapeutic exercise;Therapeutic activities;Neuromuscular re-education;Patient/family education;Manual techniques;Passive range of motion;Scar mobilization;Dry needling;Taping;Vasopneumatic Device    PT Next Visit Plan  continue Lt knee strength, flexibility, gait and edema management    PT Home Exercise Plan  Access Code: [email or web address]    Consulted and Agree with Plan of Care  Patient       Patient will benefit from skilled therapeutic intervention in order to improve the following deficits and impairments:  Pain, Increased fascial restricitons, Decreased scar mobility, Decreased strength, Decreased range of motion, Impaired flexibility, Difficulty walking  Visit Diagnosis: Muscle weakness (generalized)  Acute pain of left knee  Stiffness of left  knee, not elsewhere classified     Problem List Patient Active Problem List   Diagnosis Date Noted  . Epigastric abdominal tenderness 01/30/2016  . Dysphagia 01/30/2016  . GERD (gastroesophageal reflux disease) 01/30/2016  . Pericarditis 12/17/2015  . Constrictive pericarditis   . Chronic diastolic CHF (congestive heart failure) (HCC)   . Pulmonary hypertension ([facility])   . Precordial pain 12/05/2015  . Hypotension 12/05/2015  . Arterial hypotension   . Pericardial effusion 11/22/2015    Junious Silk, PT 12/23/2018, 2:45 PM  [facility] [address], [address] [place], [place], [id number] Phone: [phone]   Fax:  [phone]  Name: Octavio Mannsamela Williams MRN: [id number] Date of Birth: 08/30/1970

## 2018-12-27 ENCOUNTER — Ambulatory Visit: Payer: No Typology Code available for payment source

## 2018-12-27 ENCOUNTER — Other Ambulatory Visit: Payer: Self-pay

## 2018-12-27 DIAGNOSIS — M6281 Muscle weakness (generalized): Secondary | ICD-10-CM

## 2018-12-27 DIAGNOSIS — M25562 Pain in left knee: Secondary | ICD-10-CM

## 2018-12-27 DIAGNOSIS — M25662 Stiffness of left knee, not elsewhere classified: Secondary | ICD-10-CM

## 2018-12-27 NOTE — Therapy (Signed)
[facility] [address], [address] [place], [place], [address] Phone: [phone]   Fax:  [phone]  Physical Therapy Treatment  Patient Details  Name: Alicia Wood MRN: [id number] Date of Birth: 12/13/1970 Referring Provider (PT): Malon Kindle, MD   Encounter Date: 12/27/2018  PT End of Session - 12/27/18 1444    Visit Number  8    Date for PT Re-Evaluation  01/17/19    Authorization Type  [facility]    PT Start Time  1401    PT Stop Time  1442    PT Time Calculation (min)  41 min    Activity Tolerance  Patient tolerated treatment well    Behavior During Therapy  [facility] for tasks assessed/performed       Past Medical History:  Diagnosis Date  . Chest pain    a. Initially in setting of pericarditis in 11/2015; b. Ongoing cp despite prednisone, colchicine, ibuprofen,and narcotic therapy;  c. 12/2015 CTA Chest: No PE, stable small-mod pericardial effusion;  d. 12/2015 Cath: nl cors.  . Chronic diastolic CHF (congestive heart failure) (H[facility])    a. 11/2015 Echo: EF 55-60%, grade 2 diastolic dysfxn.  Marland Kitchen GERD (gastroesophageal reflux disease)   . Headache   . Hyperlipidemia   . Hypertension    Patient states she does not have hypertension.   . Pericardial effusion    a. 11/23/2015 Echo: mod pericardial effusion, no tamponade; b. 12/05/2015 Echo: small circumferential pericardial effusion; c. 12/06/2015 Cardiac MRI: pericardial thickening w/ circumferential late gad enhancement of pericardium and epicardium-->suspicious for subacute effusive constrictive pericarditis;  d. 12/19/2015 Echo: triv pericardial effusion;  e. 01/06/2016 CT Chest: small to mod pericard eff.  . Pericarditis    a. 11/2015 Cardiac MRI; EF 57%, mild MR, triv TR, mild pericardial effusion, thickening of pericardium focally up to 59mm w/ significant circumferential late gad enhancement of pericardium and epicardium sugg of subacute effusive constrictive pericarditis;  b.  12/2015 R Heart Cath: nl right heart pressures w/o evidence of constrictive physiology.    Past Surgical History:  Procedure Laterality Date  . ABDOMINAL HYSTERECTOMY    . APPENDECTOMY    . CARDIAC CATHETERIZATION N/A 01/21/2016   Procedure: Right/Left Heart Cath and Coronary Angiography;  Surgeon: Kathleene Hazel, MD;  Location: [facility] INVASIVE [facility];  Service: Cardiovascular;  Laterality: N/A;  . ESOPHAGOGASTRODUODENOSCOPY (EGD) WITH PROPOFOL N/A 01/30/2016   Procedure: ESOPHAGOGASTRODUODENOSCOPY (EGD) WITH PROPOFOL;  Surgeon: [name], MD;  Location: [facility] ENDOSCOPY;  Service: Endoscopy;  Laterality: N/A;  . PARTIAL HYSTERECTOMY    . TUBAL LIGATION      There were no vitals filed for this visit.  Subjective Assessment - 12/27/18 1411    Subjective  I am able to walk longer now.  I am doing better with going up the steps.  coming down is harder.    Currently in Pain?  Yes    Pain Score  2     Pain Location  Knee    Pain Orientation  Left    Pain Descriptors / Indicators  Tightness;Sharp    Pain Type  Surgical pain    Pain Onset  More than a month ago    Pain Frequency  Constant    Aggravating Factors   walking down the steps    Pain Relieving Factors  rest, stretching         [facility] PT Assessment - 12/27/18 0001      Observation/Other Assessments   Focus on Therapeutic  Outcomes (FOTO)   33% limitation      AROM   AROM Assessment Site  Knee    Right/Left Knee  Left    Left Knee Extension  0                   [facility] Adult PT Treatment/Exercise - 12/27/18 0001      Knee/Hip Exercises: Stretches   Active Hamstring Stretch  Left;2 reps;20 seconds      Knee/Hip Exercises: Aerobic   Nustep  Level 3 x 8 minutes   PT present to discuss progress     Knee/Hip Exercises: Machines for Strengthening   Cybex Leg Press  seat 9; back 4 - Lt LE only 40# 3x15, 100# bil legs 2x10      Knee/Hip Exercises: Standing   Heel Raises  Both;2 sets;10 reps   standing  on black foam pad   Forward Step Up  Left;2 sets;10 reps;Hand Hold: 1;Step Height: 6"    Other Standing Knee Exercises  weight shifting medial lateral on black pad x 2 minutes       Knee/Hip Exercises: Seated   Sit to Sand  2 sets;10 reps   holding 5#              PT Short Term Goals - 12/23/18 1444      PT SHORT TERM GOAL #2   Title  5/5 MMT Left knee flexion and extension with no pain so she can return to work related activities.    Baseline  able to do against resistance without pain    Status  On-going        PT Long Term Goals - 12/27/18 1415      PT LONG TERM GOAL #1   Title  Pt will be able to stand and walk for at least one hour so she can return to work    Baseline  30 minutes    Time  8    Period  Weeks    Status  On-going      PT LONG TERM GOAL #2   Title  pt will be ind with advanced HEP    Baseline  independent in current HEP      PT LONG TERM GOAL #4   Title  Pt will be 41% or less limited based on FOTO    Baseline  33% limitation    Status  Achieved            Plan - 12/27/18 1448    Clinical Impression Statement  Pt continues to improve s/p Lt knee scope.  Pt is now able to walk 30 minutes in the community.  Pt is ascending steps with step-over-step gait and descends with step-to gait.  Pt reports 2-3/10 Lt knee pain when standing and walking.  FOTO is improved to 33% limitation (61% at evaluation).  Pt demonstrated difficulty with eccentric control with stand to sit transition today.  Pt will continue to benefit from skilled PT for Lt knee strength, flexibility and endurance to allow for return to work.    PT Frequency  2x / week    PT Duration  8 weeks    PT Treatment/Interventions  ADLs/Self Care Home Management;Aquatic Therapy;Cryotherapy;Electrical Stimulation;Moist Heat;Therapeutic exercise;Therapeutic activities;Neuromuscular re-education;Patient/family education;Manual techniques;Passive range of motion;Scar mobilization;Dry  needling;Taping;Vasopneumatic Device    PT Next Visit Plan  continue Lt knee strength, flexibility, gait and edema management    PT Home Exercise Plan  Access Code: [email or web address]    Consulted  and Agree with Plan of Care  Patient       Patient will benefit from skilled therapeutic intervention in order to improve the following deficits and impairments:  Pain, Increased fascial restricitons, Decreased scar mobility, Decreased strength, Decreased range of motion, Impaired flexibility, Difficulty walking  Visit Diagnosis: Acute pain of left knee  Muscle weakness (generalized)  Stiffness of left knee, not elsewhere classified     Problem List Patient Active Problem List   Diagnosis Date Noted  . Epigastric abdominal tenderness 01/30/2016  . Dysphagia 01/30/2016  . GERD (gastroesophageal reflux disease) 01/30/2016  . Pericarditis 12/17/2015  . Constrictive pericarditis   . Chronic diastolic CHF (congestive heart failure) ([place])   . Pulmonary hypertension ([place])   . Precordial pain 12/05/2015  . Hypotension 12/05/2015  . Arterial hypotension   . Pericardial effusion 11/22/2015    Sigurd Sos, PT 12/27/18 2:50 PM  [facility] [address] [place], [place], [address] Phone: [phone]   Fax:  [phone]  Name: Alicia Wood MRN: [id number] Date of Birth: [birth year]

## 2018-12-30 ENCOUNTER — Other Ambulatory Visit: Payer: Self-pay

## 2018-12-30 ENCOUNTER — Ambulatory Visit: Payer: No Typology Code available for payment source

## 2018-12-30 DIAGNOSIS — M6281 Muscle weakness (generalized): Secondary | ICD-10-CM | POA: Diagnosis not present

## 2018-12-30 DIAGNOSIS — M25662 Stiffness of left knee, not elsewhere classified: Secondary | ICD-10-CM

## 2018-12-30 DIAGNOSIS — M25562 Pain in left knee: Secondary | ICD-10-CM

## 2018-12-30 NOTE — Therapy (Addendum)
[facility] [address], [place], [place], [address] Phone: [phone]   Fax:  [phone]  Physical Therapy Treatment  Patient Details  Name: Alicia Wood MRN: [id number] Date of Birth: 01/28/1971 Referring Provider (PT): [name], MD   Encounter Date: 12/30/2018  PT End of Session - 12/30/18 1304    Visit Number  9    Date for PT Re-Evaluation  01/17/19    Authorization Type  [facility]    PT Start Time  1230    PT Stop Time  1308    PT Time Calculation (min)  38 min    Activity Tolerance  Patient tolerated treatment well    Behavior During Therapy  [facility] for tasks assessed/performed       Past Medical History:  Diagnosis Date  . Chest pain    a. Initially in setting of pericarditis in 11/2015; b. Ongoing cp despite prednisone, colchicine, ibuprofen,and narcotic therapy;  c. 12/2015 CTA Chest: No PE, stable small-mod pericardial effusion;  d. 12/2015 Cath: nl cors.  . Chronic diastolic CHF (congestive heart failure) ([place])    a. 11/2015 Echo: EF 02-58%, grade 2 diastolic dysfxn.  Marland Kitchen GERD (gastroesophageal reflux disease)   . Headache   . Hyperlipidemia   . Hypertension    Patient states she does not have hypertension.   . Pericardial effusion    a. 11/23/2015 Echo: mod pericardial effusion, no tamponade; b. 12/05/2015 Echo: small circumferential pericardial effusion; c. 12/06/2015 Cardiac MRI: pericardial thickening w/ circumferential late gad enhancement of pericardium and epicardium-->suspicious for subacute effusive constrictive pericarditis;  d. 12/19/2015 Echo: triv pericardial effusion;  e. 01/06/2016 CT Chest: small to mod pericard eff.  . Pericarditis    a. 11/2015 Cardiac MRI; EF 57%, mild MR, triv TR, mild pericardial effusion, thickening of pericardium focally up to 57m w/ significant circumferential late gad enhancement of pericardium and epicardium sugg of subacute effusive constrictive pericarditis;  b.  12/2015 R Heart Cath: nl right heart pressures w/o evidence of constrictive physiology.    Past Surgical History:  Procedure Laterality Date  . ABDOMINAL HYSTERECTOMY    . APPENDECTOMY    . CARDIAC CATHETERIZATION N/A 01/21/2016   Procedure: Right/Left Heart Cath and Coronary Angiography;  Surgeon: CBurnell Blanks MD;  [facility];  Service: Cardiovascular;  Laterality: N/A;  . ESOPHAGOGASTRODUODENOSCOPY (EGD) WITH PROPOFOL N/A 01/30/2016   Procedure: ESOPHAGOGASTRODUODENOSCOPY (EGD) WITH PROPOFOL;  Surgeon: VWilford Corner MD;  [facility];  Service: Endoscopy;  Laterality: N/A;  . PARTIAL HYSTERECTOMY    . TUBAL LIGATION      There were no vitals filed for this visit.  Subjective Assessment - 12/30/18 1240    Subjective  I went to the gym and walked for 35 minutes on the treadmill.    Currently in Pain?  No/denies                       [facility] Adult PT Treatment/Exercise - 12/30/18 0001      Knee/Hip Exercises: Stretches   Active Hamstring Stretch  Left;2 reps;20 seconds      Knee/Hip Exercises: Aerobic   Nustep  Level 3 x 8 minutes   PT present to discuss progress     Knee/Hip Exercises: Machines for Strengthening   Cybex Leg Press  seat 9; back 4 - Lt LE only 40# 3x15, 100# bil legs 2x10      Knee/Hip Exercises: Standing   Heel Raises  Both;2 sets;10 reps   standing on black foam pad   Forward Step Up  Left;2 sets;10 reps;Hand Hold: 1    Forward Step Up Limitations  on bosu    SLS  on blue pod 5x10 seconds    Other Standing Knee Exercises  weight shifting medial lateral on black pad x 2 minutes       Knee/Hip Exercises: Seated   Sit to Sand  2 sets;10 reps   holding 5#              PT Short Term Goals - 12/23/18 1444      PT SHORT TERM GOAL #2   Title  5/5 MMT Left knee flexion and extension with no pain so she can return to work related activities.    Baseline  able to do against resistance without  pain    Status  On-going        PT Long Term Goals - 12/27/18 1415      PT LONG TERM GOAL #1   Title  Pt will be able to stand and walk for at least one hour so she can return to work    Baseline  30 minutes    Time  8    Period  Weeks    Status  On-going      PT LONG TERM GOAL #2   Title  pt will be ind with advanced HEP    Baseline  independent in current HEP      PT LONG TERM GOAL #4   Title  Pt will be 41% or less limited based on FOTO    Baseline  33% limitation    Status  Achieved            Plan - 12/30/18 1250    Clinical Impression Statement  Pt was able to go to the gym and walk on the treadmill without any limitation or pain.  Pt is able to tolerate increased weights and proprioceptive exercise in the clinic.  Pt is now able to walk 30 minutes in the community.  Pt is ascending steps with step-over-step gait and descends with step-to gait.  FOTO is improved to 33% limitation (61% at evaluation).  Pt demonstrated difficulty with eccentric control with stand to sit transition this week.  Pt will continue to benefit from skilled PT for Lt knee strength, flexibility and endurance to allow for return to work.    PT Treatment/Interventions  ADLs/Self Care Home Management;Aquatic Therapy;Cryotherapy;Electrical Stimulation;Moist Heat;Therapeutic exercise;Therapeutic activities;Neuromuscular re-education;Patient/family education;Manual techniques;Passive range of motion;Scar mobilization;Dry needling;Taping;Vasopneumatic Device    PT Next Visit Plan  continue Lt knee strength, flexibility, gait and edema management    PT Home Exercise Plan  Access Code: [email or web address]    Consulted and Agree with Plan of Care  Patient       Patient will benefit from skilled therapeutic intervention in order to improve the following deficits and impairments:  Pain, Increased fascial restricitons, Decreased scar mobility, Decreased strength, Decreased range of motion, Impaired flexibility, Difficulty  walking  Visit Diagnosis: Muscle weakness (generalized)  Acute pain of left knee  Stiffness of left knee, not elsewhere classified     Problem List Patient Active Problem List   Diagnosis Date Noted  . Epigastric abdominal tenderness 01/30/2016  . Dysphagia 01/30/2016  . GERD (gastroesophageal reflux disease) 01/30/2016  . Pericarditis 12/17/2015  . Constrictive pericarditis   . Chronic diastolic CHF (congestive heart failure) ([place])   . Pulmonary hypertension ([place])   .  Precordial pain 12/05/2015  . Hypotension 12/05/2015  . Arterial hypotension   . Pericardial effusion 11/22/2015    Sigurd Sos, PT 12/30/18 1:08 PM PHYSICAL THERAPY DISCHARGE SUMMARY  Visits from Start of Care: 9  Current functional level related to goals / functional outcomes: See above for current status.  Pt called to cancel all appointments as pt is going to return to work.     Remaining deficits: See above for most current status.     Education / Equipment: HEP Plan: Patient agrees to discharge.  Patient goals were partially met. Patient is being discharged due to being pleased with the current functional level.  ?????        Sigurd Sos, PT 01/13/19 7:22 AM   [facility] [address] [place], [place], [address] Phone: [phone]   Fax:  [phone]  Name: Alicia Wood MRN: [id number] Date of Birth: October 02, 1970

## 2019-01-03 ENCOUNTER — Ambulatory Visit: Payer: No Typology Code available for payment source

## 2019-01-06 ENCOUNTER — Ambulatory Visit: Payer: No Typology Code available for payment source

## 2019-01-10 ENCOUNTER — Ambulatory Visit: Payer: No Typology Code available for payment source | Admitting: Physical Therapy

## 2019-01-13 ENCOUNTER — Ambulatory Visit: Payer: No Typology Code available for payment source

## 2019-01-17 ENCOUNTER — Ambulatory Visit: Payer: No Typology Code available for payment source

## 2019-01-21 ENCOUNTER — Other Ambulatory Visit: Payer: Self-pay | Admitting: Family Medicine

## 2019-01-21 DIAGNOSIS — Z1231 Encounter for screening mammogram for malignant neoplasm of breast: Secondary | ICD-10-CM

## 2019-02-28 ENCOUNTER — Other Ambulatory Visit: Payer: Self-pay | Admitting: Family Medicine

## 2019-02-28 ENCOUNTER — Ambulatory Visit
Admission: RE | Admit: 2019-02-28 | Discharge: 2019-02-28 | Disposition: A | Discharge status: Home / Self Care | Payer: No Typology Code available for payment source | Source: Ambulatory Visit | Attending: Family Medicine | Admitting: Family Medicine

## 2019-02-28 DIAGNOSIS — R05 Cough: Secondary | ICD-10-CM

## 2019-02-28 DIAGNOSIS — R059 Cough, unspecified: Secondary | ICD-10-CM

## 2019-03-15 ENCOUNTER — Ambulatory Visit: Payer: No Typology Code available for payment source

## 2019-03-28 DIAGNOSIS — R05 Cough: Secondary | ICD-10-CM | POA: Diagnosis not present

## 2019-03-28 DIAGNOSIS — F3341 Major depressive disorder, recurrent, in partial remission: Secondary | ICD-10-CM | POA: Diagnosis not present

## 2019-04-13 DIAGNOSIS — M25562 Pain in left knee: Secondary | ICD-10-CM | POA: Diagnosis not present

## 2019-04-13 DIAGNOSIS — S83232A Complex tear of medial meniscus, current injury, left knee, initial encounter: Secondary | ICD-10-CM | POA: Diagnosis not present

## 2019-04-14 DIAGNOSIS — S83232D Complex tear of medial meniscus, current injury, left knee, subsequent encounter: Secondary | ICD-10-CM | POA: Diagnosis not present

## 2019-04-14 DIAGNOSIS — M25562 Pain in left knee: Secondary | ICD-10-CM | POA: Diagnosis not present

## 2019-04-14 DIAGNOSIS — M1712 Unilateral primary osteoarthritis, left knee: Secondary | ICD-10-CM | POA: Diagnosis not present

## 2019-06-08 ENCOUNTER — Ambulatory Visit (INDEPENDENT_AMBULATORY_CARE_PROVIDER_SITE_OTHER): Payer: 59 | Admitting: Bariatrics

## 2019-06-08 ENCOUNTER — Encounter (INDEPENDENT_AMBULATORY_CARE_PROVIDER_SITE_OTHER): Payer: Self-pay | Admitting: Bariatrics

## 2019-06-08 ENCOUNTER — Other Ambulatory Visit: Payer: Self-pay

## 2019-06-08 VITALS — BP 114/77 | HR 73 | Temp 98.3°F | Ht 68.0 in | Wt 301.0 lb

## 2019-06-08 DIAGNOSIS — R5383 Other fatigue: Secondary | ICD-10-CM

## 2019-06-08 DIAGNOSIS — K219 Gastro-esophageal reflux disease without esophagitis: Secondary | ICD-10-CM

## 2019-06-08 DIAGNOSIS — Z9189 Other specified personal risk factors, not elsewhere classified: Secondary | ICD-10-CM

## 2019-06-08 DIAGNOSIS — Z6841 Body Mass Index (BMI) 40.0 and over, adult: Secondary | ICD-10-CM | POA: Diagnosis not present

## 2019-06-08 DIAGNOSIS — I5032 Chronic diastolic (congestive) heart failure: Secondary | ICD-10-CM | POA: Diagnosis not present

## 2019-06-08 DIAGNOSIS — I270 Primary pulmonary hypertension: Secondary | ICD-10-CM

## 2019-06-08 DIAGNOSIS — R0602 Shortness of breath: Secondary | ICD-10-CM | POA: Diagnosis not present

## 2019-06-08 DIAGNOSIS — Z0289 Encounter for other administrative examinations: Secondary | ICD-10-CM

## 2019-06-08 DIAGNOSIS — Z1331 Encounter for screening for depression: Secondary | ICD-10-CM

## 2019-06-08 NOTE — Progress Notes (Signed)
Dear Dr. Garth Bigness,   Thank you for referring Alicia Wood to our clinic. The following note includes my evaluation and treatment recommendations.  Chief Complaint:   OBESITY Alicia Wood (MR# [id number]) is a 49 y.o. female who presents for evaluation and treatment of obesity and related comorbidities. Current BMI is Body mass index is 45.77 kg/m.Marland Kitchen Alicia Wood has been struggling with her weight for many years and has been unsuccessful in either losing weight, maintaining weight loss, or reaching her healthy weight goal.  Alicia Wood is currently in the action stage of change and ready to dedicate time achieving and maintaining a healthier weight. Alicia Wood is interested in becoming our patient and working on intensive lifestyle modifications including (but not limited to) diet and exercise for weight loss.  Alicia Wood sometimes likes to cook and does not note any obstacles. She considers herself to be a picky eater. She sometimes snacks at night. She occasionally eats during the night.  Alicia Wood's habits were reviewed today and are as follows: Her family eats meals together, she thinks her family will eat healthier with her, her desired weight loss is 100 pounds or more, she started gaining weight around age 56, her heaviest weight ever was 301 pounds, she is a picky eater and doesn't like to eat healthier foods, she snacks sometimes in the evenings, she wakes up sometimes in the middle of the night to eat, she skips meals frequently, she is frequently drinking liquids with calories, she frequently makes poor food choices, she sometimes eats larger portions than normal, she has binge eating behaviors and she struggles with emotional eating.  Depression Screen Alicia Wood's Food and Mood (modified PHQ-9) score was 8.  Depression screen PHQ 2/9 06/08/2019  Decreased Interest 2  Down, Depressed, Hopeless 1  PHQ - 2 Score 3  Altered sleeping 2  Tired, decreased energy 1  Change in appetite 2    Feeling bad or failure about yourself  0  Trouble concentrating 0  Moving slowly or fidgety/restless 0  Suicidal thoughts 0  PHQ-9 Score 8  Difficult doing work/chores Not difficult at all   Subjective:   Other fatigue. Alicia Wood admits to daytime somnolence and admits to waking up still tired. Patent has a history of symptoms of daytime fatigue, Epworth sleepiness scale and morning headache. Alicia Wood generally gets 3-4 hours of sleep per night, and states that she does not sleep well most nights. Snoring is present. Apneic episodes are not present. Epworth Sleepiness Score is 10.  Shortness of breath on exertion. Alicia Wood notes increasing shortness of breath with certain activities and seems to be worsening over time with weight gain. She notes getting out of breath sooner with activity than she used to. Alicia Wood denies shortness of breath at rest or orthopnea.  Pulmonary hypertension, primary (HCC). Blood pressure is well controlled today at 114/77.  BP Readings from Last 3 Encounters:  06/08/19 114/77  10/11/18 (!) 155/95  09/28/18 123/85   Lab Results  Component Value Date   CREATININE 0.82 02/04/2016   CREATININE 1.03 (H) 02/03/2016   CREATININE 0.80 01/14/2016   Chronic diastolic CHF (congestive heart failure) (HCC). No chest pain.  Gastroesophageal reflux disease, unspecified whether esophagitis present. Alicia Wood takes TUMS occasionally.  At risk for activity intolerance. Alicia Wood is at risk of exercise intolerance secondary to fatigue/shortness of breath, left knee meniscus tear, and obesity.  Depression screening. Alicia Wood had a mildly positive depression screen with a PHQ-9 score of 8.  Assessment/Plan:   Other fatigue. Fatigue may be  related to obesity, depression or many other causes. Labs will be ordered, and in the meanwhile, Alicia Wood will focus on self care including making healthy food choices, increasing physical activity and focusing on stress reduction. EKG 12-Lead,  Comprehensive metabolic panel, CBC with Differential/Platelet, Hemoglobin A1c, Insulin, random, VITAMIN D 25 Hydroxy (Vit-D Deficiency, Fractures), T3, T4, free, TSH studies ordered today.  Shortness of breath on exertion. Alicia Wood's shortness of breath appears to be obesity related and exercise induced. She has agreed to work on weight loss and gradually increase exercise to treat her exercise induced shortness of breath. Will continue to monitor closely.  Pulmonary hypertension, primary ([place]). Alicia Wood is working on healthy weight loss and exercise to improve blood pressure control. We will watch for signs of hypotension as she continues her lifestyle modifications. She will follow-up with her PCP. Will check CBC because pH improves with adequate iron.  Chronic diastolic CHF (congestive heart failure) ([place]). Alicia Wood will follow-up with her PCP. Comprehensive metabolic panel ordered.  Gastroesophageal reflux disease, unspecified whether esophagitis present. Intensive lifestyle modifications are the first line treatment for this issue. We discussed several lifestyle modifications today and she will continue to work on diet, exercise and weight loss efforts. Orders and follow up as documented in patient record. Alicia Wood will continue TUMS as needed.  Counseling . If a person has gastroesophageal reflux disease (GERD), food and stomach acid move back up into the esophagus and cause symptoms or problems such as damage to the esophagus. . Anti-reflux measures include: raising the head of the bed, avoiding tight clothing or belts, avoiding eating late at night, not lying down shortly after mealtime, and achieving weight loss. . Avoid ASA, NSAID's, caffeine, alcohol, and tobacco.  . OTC Pepcid and/or Tums are often very helpful for as needed use.  Marland Kitchen However, for persisting chronic or daily symptoms, stronger medications like Omeprazole may be needed. . You may need to avoid foods and drinks such as: ? Coffee and  tea (with or without caffeine). ? Drinks that contain alcohol. ? Energy drinks and sports drinks. ? Bubbly (carbonated) drinks or sodas. ? Chocolate and cocoa. ? Peppermint and mint flavorings. ? Garlic and onions. ? Horseradish. ? Spicy and acidic foods. These include peppers, chili powder, curry powder, vinegar, hot sauces, and BBQ sauce. ? Citrus fruit juices and citrus fruits, such as oranges, lemons, and limes. ? Tomato-based foods. These include red sauce, chili, salsa, and pizza with red sauce. ? Fried and fatty foods. These include donuts, french fries, potato chips, and high-fat dressings. ? High-fat meats. These include hot dogs, rib eye steak, sausage, ham, and bacon.  At risk for activity intolerance. Alicia Wood was given approximately 15 minutes of exercise intolerance counseling today. She is 49 y.o. female and has risk factors exercise intolerance including obesity. We discussed intensive lifestyle modifications today with an emphasis on specific weight loss instructions and strategies. Alicia Wood will slowly increase activity as tolerated.  Repetitive spaced learning was employed today to elicit superior memory formation and behavioral change.  Depression screening. Alicia Wood had a positive depression screening. Depression is commonly associated with obesity and often results in emotional eating behaviors. We will monitor this closely and work on CBT to help improve the non-hunger eating patterns. Referral to Psychology may be required if no improvement is seen as she continues in our clinic.  Class 3 severe obesity with serious comorbidity and body mass index (BMI) of 45.0 to 49.9 in adult, unspecified obesity type ([place]).  Alicia Wood is currently in  the action stage of change and her goal is to continue with weight loss efforts. I recommend Alicia Wood begin the structured treatment plan as follows:  She has agreed to the Category 3 Plan.  She will avoid sugary drinks, work on portion control,  and will work on meal planning.  Exercise goals: All adults should avoid inactivity. Some physical activity is better than none, and adults who participate in any amount of physical activity gain some health benefits.   Behavioral modification strategies: increasing lean protein intake, decreasing simple carbohydrates, increasing vegetables, increasing water intake, decreasing eating out, no skipping meals, meal planning and cooking strategies, keeping healthy foods in the home and planning for success.  She was informed of the importance of frequent follow-up visits to maximize her success with intensive lifestyle modifications for her multiple health conditions. She was informed we would discuss her lab results at her next visit unless there is a critical issue that needs to be addressed sooner. Alicia Wood agreed to keep her next visit at the agreed upon time to discuss these results.  Objective:   Blood pressure 114/77, pulse 73, temperature 98.3 F (36.8 C), temperature source Oral, height 5\' 8"  (1.727 m), weight (!) 301 lb (136.5 kg), SpO2 99 %. Body mass index is 45.77 kg/m.  EKG: Sinus  Rhythm with a rate of 72 BPM. Low voltage in precordial leads. Nonspecific T-abnormality. Otherwise normal.   Indirect Calorimeter completed today shows a VO2 of 275 and a REE of 1912.  Her calculated basal metabolic rate is thus her basal metabolic rate is worse than expected.  General: Cooperative, alert, well developed, in no acute distress. HEENT: Conjunctivae and lids unremarkable. Cardiovascular: Regular rhythm.  Lungs: Normal work of breathing. Neurologic: No focal deficits.   Lab Results  Component Value Date   CREATININE 0.82 02/04/2016   BUN 15 02/04/2016   NA 139 02/04/2016   K 3.8 02/04/2016   CL 100 (L) 02/04/2016   CO2 28 02/04/2016   Lab Results  Component Value Date   ALT 16 02/04/2016   AST 15 02/04/2016   ALKPHOS 69 02/04/2016   BILITOT 0.4 02/04/2016   No results  found for: HGBA1C No results found for: INSULIN Lab Results  Component Value Date   TSH 0.273 (L) 11/22/2015   No results found for: CHOL, HDL, LDLCALC, LDLDIRECT, TRIG, CHOLHDL Lab Results  Component Value Date   WBC 9.3 02/04/2016   HGB 10.9 (L) 02/04/2016   HCT 34.3 (L) 02/04/2016   MCV 87.7 02/04/2016   PLT 289 02/04/2016   No results found for: IRON, TIBC, FERRITIN  Attestation Statements:   Reviewed by clinician on day of visit: allergies, medications, problem list, medical history, surgical history, family history, social history, and previous encounter notes.  02/06/2016, am acting as Fernanda Drum for Energy manager, DO   I have reviewed the above documentation for accuracy and completeness, and I agree with the above. [employer], DO

## 2019-06-09 ENCOUNTER — Encounter (INDEPENDENT_AMBULATORY_CARE_PROVIDER_SITE_OTHER): Payer: Self-pay | Admitting: Bariatrics

## 2019-06-09 DIAGNOSIS — R7303 Prediabetes: Secondary | ICD-10-CM | POA: Insufficient documentation

## 2019-06-09 DIAGNOSIS — E559 Vitamin D deficiency, unspecified: Secondary | ICD-10-CM | POA: Insufficient documentation

## 2019-06-09 LAB — CBC WITH DIFFERENTIAL/PLATELET
Basophils Absolute: 0 10*3/uL (ref 0.0–0.2)
Basos: 1 %
EOS (ABSOLUTE): 0.1 10*3/uL (ref 0.0–0.4)
Eos: 1 %
Hematocrit: 38 % (ref 34.0–46.6)
Hemoglobin: 12.1 g/dL (ref 11.1–15.9)
Immature Grans (Abs): 0 10*3/uL (ref 0.0–0.1)
Immature Granulocytes: 0 %
Lymphocytes Absolute: 2.4 10*3/uL (ref 0.7–3.1)
Lymphs: 49 %
MCH: 28.5 pg (ref 26.6–33.0)
MCHC: 31.8 g/dL (ref 31.5–35.7)
MCV: 90 fL (ref 79–97)
Monocytes Absolute: 0.3 10*3/uL (ref 0.1–0.9)
Monocytes: 6 %
Neutrophils Absolute: 2.1 10*3/uL (ref 1.4–7.0)
Neutrophils: 43 %
Platelets: 344 10*3/uL (ref 150–450)
RBC: 4.24 x10E6/uL (ref 3.77–5.28)
RDW: 14.2 % (ref 11.7–15.4)
WBC: 5 10*3/uL (ref 3.4–10.8)

## 2019-06-09 LAB — T3: T3, Total: 121 ng/dL (ref 71–180)

## 2019-06-09 LAB — COMPREHENSIVE METABOLIC PANEL
ALT: 16 IU/L (ref 0–32)
AST: 18 IU/L (ref 0–40)
Albumin/Globulin Ratio: 1.7 (ref 1.2–2.2)
Albumin: 4.5 g/dL (ref 3.8–4.9)
Alkaline Phosphatase: 100 IU/L (ref 39–117)
BUN/Creatinine Ratio: 22 (ref 9–23)
BUN: 19 mg/dL (ref 6–24)
Bilirubin Total: 0.3 mg/dL (ref 0.0–1.2)
CO2: 25 mmol/L (ref 20–29)
Calcium: 9.9 mg/dL (ref 8.7–10.2)
Chloride: 101 mmol/L (ref 96–106)
Creatinine, Ser: 0.87 mg/dL (ref 0.57–1.00)
GFR calc Af Amer: 89 mL/min/{1.73_m2} (ref >59)
GFR calc non Af Amer: 77 mL/min/{1.73_m2} (ref >59)
Globulin, Total: 2.7 g/dL (ref 1.5–4.5)
Glucose: 99 mg/dL (ref 65–99)
Potassium: 4 mmol/L (ref 3.5–5.2)
Sodium: 140 mmol/L (ref 134–144)
Total Protein: 7.2 g/dL (ref 6.0–8.5)

## 2019-06-09 LAB — HEMOGLOBIN A1C
Est. average glucose Bld gHb Est-mCnc: 131 mg/dL
Hgb A1c MFr Bld: 6.2 % — ABNORMAL HIGH (ref 4.8–5.6)

## 2019-06-09 LAB — TSH: TSH: 0.624 u[IU]/mL (ref 0.450–4.500)

## 2019-06-09 LAB — T4, FREE: Free T4: 0.97 ng/dL (ref 0.82–1.77)

## 2019-06-09 LAB — VITAMIN D 25 HYDROXY (VIT D DEFICIENCY, FRACTURES): Vit D, 25-Hydroxy: 11.8 ng/mL — ABNORMAL LOW (ref 30.0–100.0)

## 2019-06-09 LAB — INSULIN, RANDOM: INSULIN: 8.9 u[IU]/mL (ref 2.6–24.9)

## 2019-06-22 ENCOUNTER — Other Ambulatory Visit: Payer: Self-pay

## 2019-06-22 ENCOUNTER — Ambulatory Visit (INDEPENDENT_AMBULATORY_CARE_PROVIDER_SITE_OTHER): Payer: 59 | Admitting: Bariatrics

## 2019-06-22 ENCOUNTER — Encounter (INDEPENDENT_AMBULATORY_CARE_PROVIDER_SITE_OTHER): Payer: Self-pay | Admitting: Bariatrics

## 2019-06-22 VITALS — BP 116/76 | HR 71 | Temp 98.4°F | Ht 68.0 in | Wt 297.0 lb

## 2019-06-22 DIAGNOSIS — Z9189 Other specified personal risk factors, not elsewhere classified: Secondary | ICD-10-CM | POA: Diagnosis not present

## 2019-06-22 DIAGNOSIS — R7303 Prediabetes: Secondary | ICD-10-CM | POA: Diagnosis not present

## 2019-06-22 DIAGNOSIS — E559 Vitamin D deficiency, unspecified: Secondary | ICD-10-CM

## 2019-06-22 DIAGNOSIS — F3289 Other specified depressive episodes: Secondary | ICD-10-CM | POA: Diagnosis not present

## 2019-06-22 DIAGNOSIS — Z6841 Body Mass Index (BMI) 40.0 and over, adult: Secondary | ICD-10-CM | POA: Diagnosis not present

## 2019-06-22 MED ORDER — VITAMIN D (ERGOCALCIFEROL) 1.25 MG (50000 UNIT) PO CAPS: 50000.0000 [IU] | ORAL_CAPSULE | ORAL | 0 refills | Status: DC

## 2019-06-22 MED ORDER — CONTRAVE 8-90 MG PO TB12: ORAL_TABLET | ORAL | 0 refills | Status: DC

## 2019-06-22 NOTE — Progress Notes (Signed)
Chief Complaint:   OBESITY Alicia Wood is here to discuss her progress with her obesity treatment plan along with follow-up of her obesity related diagnoses. Alicia Wood is on the Category 3 Plan and states she is following her eating plan approximately 50% of the time. Alicia Wood states she is walking 5,000 steps 5 times per week.  Today's visit was #: 2 Starting weight: 301 lbs Starting date: 06/08/2019 Today's weight: 297 lbs Today's date: 06/22/2019 Total lbs lost to date: 4 Total lbs lost since last in-office visit: 4  Interim History: Alicia Wood is down 4 lbs. She is doing well with her water intake. She does have cravings for certain foods.  Subjective:   Vitamin D deficiency. Last Vitamin D 11.8 on 06/08/2019.  Prediabetes. Alicia Wood has a diagnosis of prediabetes based on her elevated HgA1c and was informed this puts her at greater risk of developing diabetes. She continues to work on diet and exercise to decrease her risk of diabetes. She denies nausea or hypoglycemia.  Lab Results  Component Value Date   HGBA1C 6.2 (H) 06/08/2019   Lab Results  Component Value Date   INSULIN 8.9 06/08/2019   Other depression, with emotional eating. Alicia Wood is struggling with emotional eating and using food for comfort to the extent that it is negatively impacting her health. She has been working on behavior modification techniques to help reduce her emotional eating and has been somewhat successful. She shows no sign of suicidal or homicidal ideations. Alicia Wood reports cravings and appetite.  At risk for osteoporosis. Alicia Wood is at higher risk of osteopenia and osteoporosis due to Vitamin D deficiency.   Assessment/Plan:   Vitamin D deficiency. Low Vitamin D level contributes to fatigue and are associated with obesity, breast, and colon cancer. She was given a prescription for Vitamin D, Ergocalciferol, (DRISDOL) 1.25 MG (50000 UNIT) CAPS capsule every week #4 with 0 refills and will follow-up  for routine testing of Vitamin D, at least 2-3 times per year to avoid over-replacement.     Prediabetes. Alicia Wood will continue to work on weight loss, exercise, and decreasing simple carbohydrates to help decrease the risk of diabetes.   Other depression, with emotional eating. Behavior modification techniques were discussed today to help Alicia Wood deal with her emotional/non-hunger eating behaviors.  Orders and follow up as documented in patient record. She denies the absolute and relative contraindications to the medication.  Alicia Wood was given a prescription for Naltrexone-buPROPion HCl ER (CONTRAVE) 8-90 MG TB12 starting titration #120 with 0 refills.  At risk for osteoporosis. Alicia Wood was given approximately 15 minutes of osteoporosis prevention counseling today. Alicia Wood is at risk for osteopenia and osteoporosis due to her Vitamin D deficiency. She was encouraged to take her Vitamin D and follow her higher calcium diet and increase strengthening exercise to help strengthen her bones and decrease her risk of osteopenia and osteoporosis.  Repetitive spaced learning was employed today to elicit superior memory formation and behavioral change.  Class 3 severe obesity with serious comorbidity and body mass index (BMI) of 45.0 to 49.9 in adult, unspecified obesity type (Alicia Wood).  Alicia Wood is currently in the action stage of change. As such, her goal is to continue with weight loss efforts. She has agreed to the Category 3 Plan.   She will work on meal planning and intentional eating.   We independently reviewed with the patient labs from 06/08/2019 including CMP, Vitamin D, CBC, A1c, insulin, and thyroid panel.  Exercise goals: All adults should avoid inactivity.  Some physical activity is better than none, and adults who participate in any amount of physical activity gain some health benefits.  Behavioral modification strategies: increasing lean protein intake, decreasing simple carbohydrates, increasing  vegetables, increasing water intake, decreasing eating out, no skipping meals, meal planning and cooking strategies, keeping healthy foods in the home and planning for success.  Tyniesha has agreed to follow-up with our clinic in 2 weeks. She was informed of the importance of frequent follow-up visits to maximize her success with intensive lifestyle modifications for her multiple health conditions.   Objective:   Blood pressure 116/76, pulse 71, temperature 98.4 F (36.9 C), height 5\' 8"  (1.727 m), weight 297 lb (134.7 kg), SpO2 99 %. Body mass index is 45.16 kg/m.  General: Cooperative, alert, well developed, in no acute distress. HEENT: Conjunctivae and lids unremarkable. Cardiovascular: Regular rhythm.  Lungs: Normal work of breathing. Neurologic: No focal deficits.   Lab Results  Component Value Date   CREATININE 0.87 06/08/2019   BUN 19 06/08/2019   NA 140 06/08/2019   K 4.0 06/08/2019   CL 101 06/08/2019   CO2 25 06/08/2019   Lab Results  Component Value Date   ALT 16 06/08/2019   AST 18 06/08/2019   ALKPHOS 100 06/08/2019   BILITOT 0.3 06/08/2019   Lab Results  Component Value Date   HGBA1C 6.2 (H) 06/08/2019   Lab Results  Component Value Date   INSULIN 8.9 06/08/2019   Lab Results  Component Value Date   TSH 0.624 06/08/2019   No results found for: CHOL, HDL, LDLCALC, LDLDIRECT, TRIG, CHOLHDL Lab Results  Component Value Date   WBC 5.0 06/08/2019   HGB 12.1 06/08/2019   HCT 38.0 06/08/2019   MCV 90 06/08/2019   PLT 344 06/08/2019   No results found for: IRON, TIBC, FERRITIN  Attestation Statements:   Reviewed by clinician on day of visit: allergies, medications, problem list, medical history, surgical history, family history, social history, and previous encounter notes.  06/10/2019, am acting as Fernanda Drum for Energy manager, DO   I have reviewed the above documentation for accuracy and completeness, and I agree with the above. [employer], DO

## 2019-07-06 ENCOUNTER — Ambulatory Visit (INDEPENDENT_AMBULATORY_CARE_PROVIDER_SITE_OTHER): Payer: 59 | Admitting: Bariatrics

## 2019-07-06 ENCOUNTER — Encounter (INDEPENDENT_AMBULATORY_CARE_PROVIDER_SITE_OTHER): Payer: Self-pay | Admitting: Bariatrics

## 2019-07-06 ENCOUNTER — Other Ambulatory Visit: Payer: Self-pay

## 2019-07-06 VITALS — BP 114/76 | HR 67 | Temp 98.0°F | Ht 68.0 in | Wt 297.0 lb

## 2019-07-06 DIAGNOSIS — E559 Vitamin D deficiency, unspecified: Secondary | ICD-10-CM | POA: Diagnosis not present

## 2019-07-06 DIAGNOSIS — F3289 Other specified depressive episodes: Secondary | ICD-10-CM

## 2019-07-06 DIAGNOSIS — Z6841 Body Mass Index (BMI) 40.0 and over, adult: Secondary | ICD-10-CM

## 2019-07-06 NOTE — Progress Notes (Signed)
Chief Complaint:   OBESITY Alicia Wood is here to discuss her progress with her obesity treatment plan along with follow-up of her obesity related diagnoses. Alicia Wood is on the Category 3 Plan and states she is following her eating plan approximately 80% of the time. Alicia Wood states she is walking 8,000 steps 5 times per week.  Today's visit was #: 3 Starting weight: 301 lbs Starting date: 06/08/2019 Today's weight: 297 lbs Today's date: 07/06/2019 Total lbs lost to date: 4 Total lbs lost since last in-office visit: 0  Interim History: Alicia Wood works 40 hours a week at [employer] and 20 hours a week at [employer] and it can be difficult to consume all of her daily protein due to her work/sleep schedule.  Subjective:   Vitamin D deficiency. Alicia Wood is tolerating ergocalciferol well and denies side effects. Last Vitamin D 11.8 on 06/08/2019.  Other depression, with emotional eating. Alicia Wood is struggling with emotional eating and using food for comfort to the extent that it is negatively impacting her health. She has been working on behavior modification techniques to help reduce her emotional eating and has been somewhat successful. She shows no sign of suicidal or homicidal ideations. Naltrexone-Bupropion 8/90 mg taper was sent in to [facility], however, insurance has yet to approve prescription.  Assessment/Plan:   Vitamin D deficiency. Low Vitamin D level contributes to fatigue and are associated with obesity, breast, and colon cancer. She agrees to continue to take ergocalciferol and will follow-up for routine testing of Vitamin D, at least 2-3 times per year to avoid over-replacement.  Other depression, with emotional eating. Behavior modification techniques were discussed today to help Alicia Wood deal with her emotional/non-hunger eating behaviors.  Orders and follow up as documented in patient record. Naltrexone-Bupropion prescription requires prior authorization. Alicia Wood will complete the  required paperwork.  Class 3 severe obesity with serious comorbidity and body mass index (BMI) of 45.0 to 49.9 in adult, unspecified obesity type (HCC).  Alicia Wood is currently in the action stage of change. As such, her goal is to continue with weight loss efforts. She has agreed to the Category 3 Plan. We discussed alternative protein sources.  Exercise goals: Alicia Wood will continue her current exercise regimen.  Behavioral modification strategies: increasing lean protein intake, no skipping meals and meal planning and cooking strategies.  Alicia Wood has agreed to follow-up with our clinic in 4 weeks. She was informed of the importance of frequent follow-up visits to maximize her success with intensive lifestyle modifications for her multiple health conditions.   Objective:   Blood pressure 114/76, pulse 67, temperature 98 F (36.7 C), height 5\' 8"  (1.727 m), weight 297 lb (134.7 kg), SpO2 99 %. Body mass index is 45.16 kg/m.  General: Cooperative, alert, well developed, in no acute distress. HEENT: Conjunctivae and lids unremarkable. Cardiovascular: Regular rhythm.  Lungs: Normal work of breathing. Neurologic: No focal deficits.   Lab Results  Component Value Date   CREATININE 0.87 06/08/2019   BUN 19 06/08/2019   NA 140 06/08/2019   K 4.0 06/08/2019   CL 101 06/08/2019   CO2 25 06/08/2019   Lab Results  Component Value Date   ALT 16 06/08/2019   AST 18 06/08/2019   ALKPHOS 100 06/08/2019   BILITOT 0.3 06/08/2019   Lab Results  Component Value Date   HGBA1C 6.2 (H) 06/08/2019   Lab Results  Component Value Date   INSULIN 8.9 06/08/2019   Lab Results  Component Value Date   TSH  0.624 06/08/2019   No results found for: CHOL, HDL, LDLCALC, LDLDIRECT, TRIG, CHOLHDL Lab Results  Component Value Date   WBC 5.0 06/08/2019   HGB 12.1 06/08/2019   HCT 38.0 06/08/2019   MCV 90 06/08/2019   PLT 344 06/08/2019   No results found for: IRON, TIBC, FERRITIN  Attestation  Statements:   Reviewed by clinician on day of visit: allergies, medications, problem list, medical history, surgical history, family history, social history, and previous encounter notes.  Time spent on visit including pre-visit chart review and post-visit charting and care was 24 minutes.   Migdalia Dk, am acting as Location manager for [employer], DO   I have reviewed the above documentation for accuracy and completeness, and I agree with the above. Jearld Lesch, DO

## 2019-08-02 ENCOUNTER — Other Ambulatory Visit: Payer: Self-pay

## 2019-08-02 ENCOUNTER — Ambulatory Visit (INDEPENDENT_AMBULATORY_CARE_PROVIDER_SITE_OTHER): Payer: 59 | Admitting: Bariatrics

## 2019-08-02 ENCOUNTER — Encounter (INDEPENDENT_AMBULATORY_CARE_PROVIDER_SITE_OTHER): Payer: Self-pay | Admitting: Bariatrics

## 2019-08-02 VITALS — BP 115/74 | HR 68 | Temp 98.4°F | Ht 68.0 in | Wt 292.0 lb

## 2019-08-02 DIAGNOSIS — Z9189 Other specified personal risk factors, not elsewhere classified: Secondary | ICD-10-CM | POA: Diagnosis not present

## 2019-08-02 DIAGNOSIS — Z6841 Body Mass Index (BMI) 40.0 and over, adult: Secondary | ICD-10-CM | POA: Diagnosis not present

## 2019-08-02 DIAGNOSIS — R7303 Prediabetes: Secondary | ICD-10-CM | POA: Diagnosis not present

## 2019-08-02 DIAGNOSIS — E559 Vitamin D deficiency, unspecified: Secondary | ICD-10-CM | POA: Diagnosis not present

## 2019-08-02 MED ORDER — VITAMIN D (ERGOCALCIFEROL) 1.25 MG (50000 UNIT) PO CAPS: 50000.0000 [IU] | ORAL_CAPSULE | ORAL | 0 refills | Status: DC

## 2019-08-02 NOTE — Progress Notes (Signed)
Chief Complaint:   OBESITY Alicia Wood is here to discuss her progress with her obesity treatment plan along with follow-up of her obesity related diagnoses. Alicia Wood is on the Category 3 Plan and states she is following her eating plan approximately 90% of the time. Alicia Wood states she is doing squats/situps/walking 30 minutes 5 times per week.  Today's visit was #: 4 Starting weight: 301 lbs Starting date: 06/08/2019 Today's weight: 292 lbs Today's date: 08/02/2019 Total lbs lost to date: 9 Total lbs lost since last in-office visit: 5  Interim History: Alicia Wood is down 5 lbs and doing well overall. She just got Contrave last week and will taper.  Subjective:   Vitamin D deficiency. No nausea, vomiting, or muscle weakness. Last Vitamin D 11.8 on 06/08/2019.  Prediabetes. Alicia Wood has a diagnosis of prediabetes based on her elevated HgA1c and was informed this puts her at greater risk of developing diabetes. She continues to work on diet and exercise to decrease her risk of diabetes. She denies nausea or hypoglycemia. Alicia Wood is on no medication.  Lab Results  Component Value Date   HGBA1C 6.2 (H) 06/08/2019   Lab Results  Component Value Date   INSULIN 8.9 06/08/2019   At risk for osteoporosis. Alicia Wood is at higher risk of osteopenia and osteoporosis due to Vitamin D deficiency.   Assessment/Plan:   Vitamin D deficiency. Low Vitamin D level contributes to fatigue and are associated with obesity, breast, and colon cancer. She was given a prescription for Vitamin D, Ergocalciferol, (DRISDOL) 1.25 MG (50000 UNIT) CAPS capsule every week #4 with 0 refills and will follow-up for routine testing of Vitamin D, at least 2-3 times per year to avoid over-replacement.     Prediabetes. Alicia Wood will continue to work on weight loss, exercise, increasing healthy fats and protein, and decreasing simple carbohydrates to help decrease the risk of diabetes.   At risk for osteoporosis. Alicia Wood was  given approximately 15 minutes of osteoporosis prevention counseling today. Alicia Wood is at risk for osteopenia and osteoporosis due to her Vitamin D deficiency. She was encouraged to take her Vitamin D and follow her higher calcium diet and increase strengthening exercise to help strengthen her bones and decrease her risk of osteopenia and osteoporosis.  Repetitive spaced learning was employed today to elicit superior memory formation and behavioral change.  Class 3 severe obesity with serious comorbidity and body mass index (BMI) of 40.0 to 44.9 in adult, unspecified obesity type ([place]).  Alicia Wood is currently in the action stage of change. As such, her goal is to continue with weight loss efforts. She has agreed to the Category 3 Plan.   She will work on meal planning and intentional eating.  Exercise goals: Alicia Wood will continue her current exercise regimen.  Behavioral modification strategies: increasing lean protein intake, decreasing simple carbohydrates, increasing vegetables, increasing water intake, decreasing eating out, no skipping meals, meal planning and cooking strategies, keeping healthy foods in the home and planning for success.  Alicia Wood has agreed to follow-up with our clinic in 2 weeks. She was informed of the importance of frequent follow-up visits to maximize her success with intensive lifestyle modifications for her multiple health conditions.   Objective:   Blood pressure 115/74, pulse 68, temperature 98.4 F (36.9 C), height 5\' 8"  (1.727 m), weight 292 lb (132.5 kg), SpO2 96 %. Body mass index is 44.4 kg/m.  General: Cooperative, alert, well developed, in no acute distress. HEENT: Conjunctivae and lids unremarkable. Cardiovascular: Regular rhythm.  Lungs: Normal work of breathing. Neurologic: No focal deficits.   Lab Results  Component Value Date   CREATININE 0.87 06/08/2019   BUN 19 06/08/2019   NA 140 06/08/2019   K 4.0 06/08/2019   CL 101 06/08/2019   CO2 25  06/08/2019   Lab Results  Component Value Date   ALT 16 06/08/2019   AST 18 06/08/2019   ALKPHOS 100 06/08/2019   BILITOT 0.3 06/08/2019   Lab Results  Component Value Date   HGBA1C 6.2 (H) 06/08/2019   Lab Results  Component Value Date   INSULIN 8.9 06/08/2019   Lab Results  Component Value Date   TSH 0.624 06/08/2019   No results found for: CHOL, HDL, LDLCALC, LDLDIRECT, TRIG, CHOLHDL Lab Results  Component Value Date   WBC 5.0 06/08/2019   HGB 12.1 06/08/2019   HCT 38.0 06/08/2019   MCV 90 06/08/2019   PLT 344 06/08/2019   No results found for: IRON, TIBC, FERRITIN  Attestation Statements:   Reviewed by clinician on day of visit: allergies, medications, problem list, medical history, surgical history, family history, social history, and previous encounter notes.  Fernanda Drum, am acting as Energy manager for [employer], DO   I have reviewed the above documentation for accuracy and completeness, and I agree with the above. Corinna Capra, DO

## 2019-08-16 ENCOUNTER — Ambulatory Visit (INDEPENDENT_AMBULATORY_CARE_PROVIDER_SITE_OTHER): Payer: 59 | Admitting: Bariatrics

## 2019-08-16 ENCOUNTER — Other Ambulatory Visit: Payer: Self-pay

## 2019-08-16 ENCOUNTER — Encounter (INDEPENDENT_AMBULATORY_CARE_PROVIDER_SITE_OTHER): Payer: Self-pay | Admitting: Bariatrics

## 2019-08-16 VITALS — BP 123/88 | HR 72 | Temp 97.9°F | Ht 68.0 in | Wt 284.0 lb

## 2019-08-16 DIAGNOSIS — F3289 Other specified depressive episodes: Secondary | ICD-10-CM

## 2019-08-16 DIAGNOSIS — R7303 Prediabetes: Secondary | ICD-10-CM | POA: Diagnosis not present

## 2019-08-16 DIAGNOSIS — Z9189 Other specified personal risk factors, not elsewhere classified: Secondary | ICD-10-CM

## 2019-08-16 DIAGNOSIS — E559 Vitamin D deficiency, unspecified: Secondary | ICD-10-CM | POA: Diagnosis not present

## 2019-08-16 DIAGNOSIS — K5909 Other constipation: Secondary | ICD-10-CM

## 2019-08-16 DIAGNOSIS — Z6841 Body Mass Index (BMI) 40.0 and over, adult: Secondary | ICD-10-CM | POA: Diagnosis not present

## 2019-08-16 MED ORDER — VITAMIN D (ERGOCALCIFEROL) 1.25 MG (50000 UNIT) PO CAPS: 50000.0000 [IU] | ORAL_CAPSULE | ORAL | 0 refills | Status: DC

## 2019-08-16 MED ORDER — CONTRAVE 8-90 MG PO TB12: 2.0000 | ORAL_TABLET | Freq: Two times a day (BID) | ORAL | 0 refills | Status: DC

## 2019-08-16 NOTE — Progress Notes (Signed)
Chief Complaint:   OBESITY Alicia Wood is here to discuss her progress with her obesity treatment plan along with follow-up of her obesity related diagnoses. Alicia Wood is on the Category 3 Plan and states she is following her eating plan approximately 75% of the time. Alicia Wood states she is walking/sit-ups/squats 45 minutes 5 times per week.  Today's visit was #: 5 Starting weight: 301 lbs Starting date: 06/08/2019 Today's weight: 284 lbs Today's date: 08/16/2019 Total lbs lost to date: 17 Total lbs lost since last in-office visit: 8  Interim History: Alicia Wood is down 8 lbs and doing well overall. She is taking Contrave 2 in the a.m. and 1 in the evening.  Subjective:   Prediabetes.  Alicia Wood has a diagnosis of prediabetes based on her elevated HgA1c and was informed this puts her at greater risk of developing diabetes. She continues to work on diet and exercise to decrease her risk of diabetes. She denies nausea or hypoglycemia. No polyphagia. She is on no medication.  Lab Results  Component Value Date   HGBA1C 6.2 (H) 06/08/2019   Lab Results  Component Value Date   INSULIN 8.9 06/08/2019   Other depression, with emotional eating. Alicia Wood is struggling with emotional eating and using food for comfort to the extent that it is negatively impacting her health. She has been working on behavior modification techniques to help reduce her emotional eating and has been somewhat successful. She shows no sign of suicidal or homicidal ideations. Alicia Wood is on no medication.  Vitamin D deficiency. No nausea, vomiting, or muscle weakness. Last Vitamin D 11.8 on 06/08/2019.  Other constipation. Alicia Wood is taking Colace.  At risk for hypoglycemia. Alicia Wood is at increased risk for hypoglycemia due to prediabetes and obesity.   Assessment/Plan:   Prediabetes. Alicia Wood will continue to work on weight loss, continue exercise/activities, and decreasing simple carbohydrates to help decrease the risk  of diabetes.   Other depression, with emotional eating. Behavior modification techniques were discussed today to help Alicia Wood deal with her emotional/non-hunger eating behaviors.  Orders and follow up as documented in patient record. We discussed CBT techniques.  Vitamin D deficiency. Low Vitamin D level contributes to fatigue and are associated with obesity, breast, and colon cancer. She was given a prescription for Vitamin D, Ergocalciferol, (DRISDOL) 1.25 MG (50000 UNIT) CAPS capsule every week #4 with 0 refills and will follow-up for routine testing of Vitamin D, at least 2-3 times per year to avoid over-replacement.   Other constipation. Alicia Wood was informed that a decrease in bowel movement frequency is normal while losing weight, but stools should not be hard or painful. Orders and follow up as documented in patient record. She will consider Citracal.  Counseling Getting to Good Bowel Health: Your goal is to have one soft bowel movement each day. Drink at least 8 glasses of water each day. Eat plenty of fiber (goal is over 25 grams each day). It is best to get most of your fiber from dietary sources which includes leafy green vegetables, fresh fruit, and whole grains. You may need to add fiber with the help of OTC fiber supplements. These include Metamucil, Citrucel, and Flaxseed. If you are still having trouble, try adding Miralax or Magnesium Citrate. If all of these changes do not work, Dietitian.  At risk for hypoglycemia. Alicia Wood was given approximately 15 minutes of counseling today regarding prevention of hypoglycemia. She was advised of symptoms of hypoglycemia. Alicia Wood was instructed to avoid skipping meals, eat  regular protein rich meals and schedule low calorie snacks as needed.   Repetitive spaced learning was employed today to elicit superior memory formation and behavioral change.  Class 3 severe obesity with serious comorbidity and body mass index (BMI) of 40.0 to 44.9  in adult, unspecified obesity type ([place]). Prescription was given for Naltrexone-buPROPion HCl ER (CONTRAVE) 8-90 MG TB12 2 tablets in the a.m. and 1 in the p.m. #120 with 0 refills.  Alicia Wood is currently in the action stage of change. As such, her goal is to continue with weight loss efforts. She has agreed to the Category 3 Plan.   She will work on meal planning, intentional eating, and being more adherent to the plan.  Exercise goals: Alicia Wood will continue her current exercise regimen.  Behavioral modification strategies: increasing lean protein intake, decreasing simple carbohydrates, increasing vegetables, increasing water intake, decreasing eating out, no skipping meals, meal planning and cooking strategies, keeping healthy foods in the home and planning for success.  Alicia Wood has agreed to follow-up with our clinic in 2 weeks. She was informed of the importance of frequent follow-up visits to maximize her success with intensive lifestyle modifications for her multiple health conditions.   Objective:   Blood pressure 123/88, pulse 72, temperature 97.9 F (36.6 C), temperature source Oral, height 5\' 8"  (1.727 m), weight 284 lb (128.8 kg), SpO2 97 %. Body mass index is 43.18 kg/m.  General: Cooperative, alert, well developed, in no acute distress. HEENT: Conjunctivae and lids unremarkable. Cardiovascular: Regular rhythm.  Lungs: Normal work of breathing. Neurologic: No focal deficits.   Lab Results  Component Value Date   CREATININE 0.87 06/08/2019   BUN 19 06/08/2019   NA 140 06/08/2019   K 4.0 06/08/2019   CL 101 06/08/2019   CO2 25 06/08/2019   Lab Results  Component Value Date   ALT 16 06/08/2019   AST 18 06/08/2019   ALKPHOS 100 06/08/2019   BILITOT 0.3 06/08/2019   Lab Results  Component Value Date   HGBA1C 6.2 (H) 06/08/2019   Lab Results  Component Value Date   INSULIN 8.9 06/08/2019   Lab Results  Component Value Date   TSH 0.624 06/08/2019   No results  found for: CHOL, HDL, LDLCALC, LDLDIRECT, TRIG, CHOLHDL Lab Results  Component Value Date   WBC 5.0 06/08/2019   HGB 12.1 06/08/2019   HCT 38.0 06/08/2019   MCV 90 06/08/2019   PLT 344 06/08/2019   No results found for: IRON, TIBC, FERRITIN  Attestation Statements:   Reviewed by clinician on day of visit: allergies, medications, problem list, medical history, surgical history, family history, social history, and previous encounter notes.  Time spent on visit including pre-visit chart review and post-visit charting and care was 20 minutes.   Migdalia Dk, am acting as Location manager for [employer], DO   I have reviewed the above documentation for accuracy and completeness, and I agree with the above. Jearld Lesch, DO

## 2019-09-12 ENCOUNTER — Other Ambulatory Visit: Payer: Self-pay

## 2019-09-12 ENCOUNTER — Ambulatory Visit (INDEPENDENT_AMBULATORY_CARE_PROVIDER_SITE_OTHER): Payer: 59 | Admitting: Bariatrics

## 2019-09-12 ENCOUNTER — Encounter (INDEPENDENT_AMBULATORY_CARE_PROVIDER_SITE_OTHER): Payer: Self-pay | Admitting: Bariatrics

## 2019-09-12 VITALS — BP 124/85 | HR 69 | Temp 98.0°F | Ht 68.0 in | Wt 283.0 lb

## 2019-09-12 DIAGNOSIS — E559 Vitamin D deficiency, unspecified: Secondary | ICD-10-CM

## 2019-09-12 DIAGNOSIS — Z6841 Body Mass Index (BMI) 40.0 and over, adult: Secondary | ICD-10-CM | POA: Diagnosis not present

## 2019-09-12 DIAGNOSIS — R7303 Prediabetes: Secondary | ICD-10-CM

## 2019-09-12 DIAGNOSIS — Z9189 Other specified personal risk factors, not elsewhere classified: Secondary | ICD-10-CM | POA: Diagnosis not present

## 2019-09-12 DIAGNOSIS — E66813 Obesity, class 3: Secondary | ICD-10-CM

## 2019-09-12 MED ORDER — VITAMIN D (ERGOCALCIFEROL) 1.25 MG (50000 UNIT) PO CAPS: 50000.0000 [IU] | ORAL_CAPSULE | ORAL | 0 refills | Status: DC

## 2019-09-13 ENCOUNTER — Encounter (INDEPENDENT_AMBULATORY_CARE_PROVIDER_SITE_OTHER): Payer: Self-pay | Admitting: Bariatrics

## 2019-09-13 NOTE — Progress Notes (Signed)
Chief Complaint:   OBESITY Alicia Wood is here to discuss her progress with her obesity treatment plan along with follow-up of her obesity related diagnoses. Alicia Wood is on the Category 3 Plan and states she is following her eating plan approximately 50% of the time. Alicia Wood states she is doing cardio 28 minutes 5 times per week.  Today's visit was #: 6 Starting weight: 301 lbs Starting date: 06/08/2019 Today's weight: 283 lbs Today's date: 09/12/2019 Total lbs lost to date: 18 Total lbs lost since last in-office visit: 1  Interim History: Alicia Wood is down 1 lb since her last visit. She reports doing well with her water and protein intake. She is taking Contrave 2 in the a.m. and 2 in the p.m.  Subjective:   Vitamin D deficiency. Alicia Wood is taking Vitamin D supplementation. Last Vitamin D was 11.8 on 06/08/2019.  Prediabetes. Alicia Wood has a diagnosis of prediabetes based on her elevated HgA1c and was informed this puts her at greater risk of developing diabetes. She continues to work on diet and exercise to decrease her risk of diabetes. She denies nausea or hypoglycemia. Alicia Wood is on no medications.  Lab Results  Component Value Date   HGBA1C 6.2 (H) 06/08/2019   Lab Results  Component Value Date   INSULIN 8.9 06/08/2019   At risk for diabetes mellitus. Alicia Wood is at higher than average risk for developing diabetes due to her obesity.   Assessment/Plan:   Vitamin D deficiency. Low Vitamin D level contributes to fatigue and are associated with obesity, breast, and colon cancer. She was given a prescription for Vitamin D, Ergocalciferol, (DRISDOL) 1.25 MG (50000 UNIT) CAPS capsule every week #4 with 0 refills. and will follow-up for routine testing of Vitamin D, at least 2-3 times per year to avoid over-replacement.   Prediabetes. Alicia Wood will continue to work on weight loss, exercise, increasing healthy fats and protein, and decreasing simple carbohydrates to help decrease the  risk of diabetes.   At risk for diabetes mellitus. Alicia Wood was given approximately 15 minutes of diabetes education and counseling today. We discussed intensive lifestyle modifications today with an emphasis on weight loss as well as increasing exercise and decreasing simple carbohydrates in her diet. We also reviewed medication options with an emphasis on risk versus benefit of those discussed.   Repetitive spaced learning was employed today to elicit superior memory formation and behavioral change.  Class 3 severe obesity with serious comorbidity and body mass index (BMI) of 40.0 to 44.9 in adult, unspecified obesity type (HCC).  Alicia Wood is currently in the action stage of change. As such, her goal is to continue with weight loss efforts. She has agreed to the Category 3 Plan.   She will work on meal planning and intentional eating. Alicia Wood will continue Contrave as directed.  Exercise goals: Alicia Wood will do cardio 28 minutes about 5 days a week.  Behavioral modification strategies: increasing lean protein intake, decreasing simple carbohydrates, increasing vegetables, increasing water intake, decreasing eating out, no skipping meals, meal planning and cooking strategies, keeping healthy foods in the home and planning for success.  Alicia Wood has agreed to follow-up with our clinic in 2 weeks. She was informed of the importance of frequent follow-up visits to maximize her success with intensive lifestyle modifications for her multiple health conditions.   Objective:   Blood pressure 124/85, pulse 69, temperature 98 F (36.7 C), height 5\' 8"  (1.727 m), weight 283 lb (128.4 kg), SpO2 96 %. Body mass index is  43.03 kg/m.  General: Cooperative, alert, well developed, in no acute distress. HEENT: Conjunctivae and lids unremarkable. Cardiovascular: Regular rhythm.  Lungs: Normal work of breathing. Neurologic: No focal deficits.   Lab Results  Component Value Date   CREATININE 0.87 06/08/2019    BUN 19 06/08/2019   NA 140 06/08/2019   K 4.0 06/08/2019   CL 101 06/08/2019   CO2 25 06/08/2019   Lab Results  Component Value Date   ALT 16 06/08/2019   AST 18 06/08/2019   ALKPHOS 100 06/08/2019   BILITOT 0.3 06/08/2019   Lab Results  Component Value Date   HGBA1C 6.2 (H) 06/08/2019   Lab Results  Component Value Date   INSULIN 8.9 06/08/2019   Lab Results  Component Value Date   TSH 0.624 06/08/2019   No results found for: CHOL, HDL, LDLCALC, LDLDIRECT, TRIG, CHOLHDL Lab Results  Component Value Date   WBC 5.0 06/08/2019   HGB 12.1 06/08/2019   HCT 38.0 06/08/2019   MCV 90 06/08/2019   PLT 344 06/08/2019   No results found for: IRON, TIBC, FERRITIN  Attestation Statements:   Reviewed by clinician on day of visit: allergies, medications, problem list, medical history, surgical history, family history, social history, and previous encounter notes.  Migdalia Dk, am acting as Location manager for [employer], DO   I have reviewed the above documentation for accuracy and completeness, and I agree with the above. Jearld Lesch, DO

## 2019-09-29 ENCOUNTER — Ambulatory Visit (INDEPENDENT_AMBULATORY_CARE_PROVIDER_SITE_OTHER): Payer: 59 | Admitting: Bariatrics

## 2019-10-04 ENCOUNTER — Other Ambulatory Visit (INDEPENDENT_AMBULATORY_CARE_PROVIDER_SITE_OTHER): Payer: Self-pay | Admitting: Bariatrics

## 2019-10-18 ENCOUNTER — Encounter (INDEPENDENT_AMBULATORY_CARE_PROVIDER_SITE_OTHER): Payer: Self-pay | Admitting: Bariatrics

## 2019-10-18 ENCOUNTER — Other Ambulatory Visit: Payer: Self-pay

## 2019-10-18 ENCOUNTER — Ambulatory Visit (INDEPENDENT_AMBULATORY_CARE_PROVIDER_SITE_OTHER): Payer: 59 | Admitting: Bariatrics

## 2019-10-18 VITALS — BP 110/74 | HR 78 | Temp 98.1°F | Ht 68.0 in | Wt 283.0 lb

## 2019-10-18 DIAGNOSIS — Z6841 Body Mass Index (BMI) 40.0 and over, adult: Secondary | ICD-10-CM | POA: Diagnosis not present

## 2019-10-18 DIAGNOSIS — R7303 Prediabetes: Secondary | ICD-10-CM

## 2019-10-18 DIAGNOSIS — E559 Vitamin D deficiency, unspecified: Secondary | ICD-10-CM

## 2019-10-18 DIAGNOSIS — F3289 Other specified depressive episodes: Secondary | ICD-10-CM | POA: Diagnosis not present

## 2019-10-18 DIAGNOSIS — Z9189 Other specified personal risk factors, not elsewhere classified: Secondary | ICD-10-CM | POA: Diagnosis not present

## 2019-10-18 MED ORDER — FLUOXETINE HCL 10 MG PO CAPS: 10.0000 mg | ORAL_CAPSULE | Freq: Every day | ORAL | 0 refills | Status: DC

## 2019-10-18 MED ORDER — CONTRAVE 8-90 MG PO TB12: ORAL_TABLET | ORAL | 0 refills | Status: DC

## 2019-10-18 NOTE — Progress Notes (Signed)
Chief Complaint:   OBESITY Alicia Wood is here to discuss her progress with her obesity treatment plan along with follow-up of her obesity related diagnoses. [name] is on the Category 3 Plan and states she is following her eating plan approximately 40% of the time. [name] states she is walking 6,000 steps daily 5 times per week.  Today's visit was #: 7 Starting weight: 301 lbs Starting date: 06/08/2019 Today's weight: 283 lbs Today's date: 10/18/2019 Total lbs lost to date: 18 Total lbs lost since last in-office visit: 0  Interim History: [name]'s weight remains the same, but she has done well in the past. She is taking Contrave 2 in the a.m. and 2 in the p.m.  Subjective:   Other depression, with emotional eating. [name] is struggling with emotional eating and using food for comfort to the extent that it is negatively impacting her health. She has been working on behavior modification techniques to help reduce her emotional eating and has been somewhat successful. She shows no sign of suicidal or homicidal ideations. [name] reports symptoms are controlled.  Prediabetes. [name] has a diagnosis of prediabetes based on her elevated HgA1c and was informed this puts her at greater risk of developing diabetes. She continues to work on diet and exercise to decrease her risk of diabetes. She denies nausea or hypoglycemia. [name] is on no medication. She denies polyphagia.  Lab Results  Component Value Date   HGBA1C 6.2 (H) 06/08/2019   Lab Results  Component Value Date   INSULIN 8.9 06/08/2019   Vitamin D deficiency. [name] is taking Vitamin D supplementation.    Ref. Range 06/08/2019 11:51  Vitamin D, 25-Hydroxy Latest Ref Range: 30.0 - 100.0 ng/mL 11.8 (L)   At risk for osteoporosis. [name] is at higher risk of osteopenia and osteoporosis due to Vitamin D deficiency.   Assessment/Plan:   Other depression, with emotional eating. Behavior modification techniques were  discussed today to help Jeris deal with her emotional/non-hunger eating behaviors.  Orders and follow up as documented in patient record. Prescription was given for FLUoxetine (PROZAC) 10 MG capsule 1 PO daily #30 with 0 refills.  Prediabetes. Patrece will continue to work on weight loss, exercise, increasing healthy fats and protein, and decreasing simple carbohydrates to help decrease the risk of diabetes.   Vitamin D deficiency. Low Vitamin D level contributes to fatigue and are associated with obesity, breast, and colon cancer. She agrees to continue to take Vitamin D as directed and will follow-up for routine testing of Vitamin D, at least 2-3 times per year to avoid over-replacement.  At risk for osteoporosis. Samina was given approximately 15 minutes of osteoporosis prevention counseling today. [name] is at risk for osteopenia and osteoporosis due to her Vitamin D deficiency. She was encouraged to take her Vitamin D and follow her higher calcium diet and increase strengthening exercise to help strengthen her bones and decrease her risk of osteopenia and osteoporosis.  Repetitive spaced learning was employed today to elicit superior memory formation and behavioral change.  Class 3 severe obesity with serious comorbidity and body mass index (BMI) of 40.0 to 44.9 in adult, unspecified obesity type (HCC). [name] will continue Naltrexone-buPROPion HCl ER (CONTRAVE) 8-90 MG TB12 2 tablets in the a.m. and p.m. #120 with 0 refills.  [name] is currently in the action stage of change. As such, her goal is to continue with weight loss efforts. She has agreed to keeping a food journal and adhering to recommended goals of 1400  calories and 80 grams of protein.   She will work on meal planning, intentional eating, and increasing her protein intake.   Exercise goals: [name] will continue walking 6,000 steps 5 days a week.  Behavioral modification strategies: increasing lean protein intake, decreasing  simple carbohydrates, increasing vegetables, increasing water intake, decreasing eating out, no skipping meals, meal planning and cooking strategies, keeping healthy foods in the home and planning for success.  [name] has agreed to follow-up with our clinic fasting in 2-3 weeks. She was informed of the importance of frequent follow-up visits to maximize her success with intensive lifestyle modifications for her multiple health conditions.   Objective:   Blood pressure 110/74, pulse 78, temperature 98.1 F (36.7 C), height 5\' 8"  (1.727 m), weight (!) 283 lb (128.4 kg), SpO2 100 %. Body mass index is 43.03 kg/m.  General: Cooperative, alert, well developed, in no acute distress. HEENT: Conjunctivae and lids unremarkable. Cardiovascular: Regular rhythm.  Lungs: Normal work of breathing. Neurologic: No focal deficits.   Lab Results  Component Value Date   CREATININE 0.87 06/08/2019   BUN 19 06/08/2019   NA 140 06/08/2019   K 4.0 06/08/2019   CL 101 06/08/2019   CO2 25 06/08/2019   Lab Results  Component Value Date   ALT 16 06/08/2019   AST 18 06/08/2019   ALKPHOS 100 06/08/2019   BILITOT 0.3 06/08/2019   Lab Results  Component Value Date   HGBA1C 6.2 (H) 06/08/2019   Lab Results  Component Value Date   INSULIN 8.9 06/08/2019   Lab Results  Component Value Date   TSH 0.624 06/08/2019   No results found for: CHOL, HDL, LDLCALC, LDLDIRECT, TRIG, CHOLHDL Lab Results  Component Value Date   WBC 5.0 06/08/2019   HGB 12.1 06/08/2019   HCT 38.0 06/08/2019   MCV 90 06/08/2019   PLT 344 06/08/2019   No results found for: IRON, TIBC, FERRITIN  Attestation Statements:   Reviewed by clinician on day of visit: allergies, medications, problem list, medical history, surgical history, family history, social history, and previous encounter notes.  06/10/2019, am acting as Fernanda Drum for Energy manager, DO   I have reviewed the above documentation for accuracy and  completeness, and I agree with the above. [employer], DO

## 2019-11-08 ENCOUNTER — Ambulatory Visit (INDEPENDENT_AMBULATORY_CARE_PROVIDER_SITE_OTHER): Payer: 59 | Admitting: Bariatrics

## 2019-12-14 ENCOUNTER — Emergency Department (HOSPITAL_COMMUNITY)
Admission: EM | Admit: 2019-12-14 | Discharge: 2019-12-14 | Disposition: A | Discharge status: Home / Self Care | Payer: 59 | Attending: Emergency Medicine | Admitting: Emergency Medicine

## 2019-12-14 ENCOUNTER — Encounter (HOSPITAL_COMMUNITY): Payer: Self-pay

## 2019-12-14 ENCOUNTER — Emergency Department (HOSPITAL_COMMUNITY): Payer: 59

## 2019-12-14 ENCOUNTER — Encounter (HOSPITAL_COMMUNITY): Payer: Self-pay | Admitting: Emergency Medicine

## 2019-12-14 ENCOUNTER — Other Ambulatory Visit: Payer: Self-pay

## 2019-12-14 ENCOUNTER — Ambulatory Visit (HOSPITAL_COMMUNITY)
Admission: RE | Admit: 2019-12-14 | Discharge: 2019-12-14 | Disposition: A | Discharge status: Home / Self Care | Payer: 59 | Source: Ambulatory Visit | Attending: Emergency Medicine | Admitting: Emergency Medicine

## 2019-12-14 DIAGNOSIS — R0602 Shortness of breath: Secondary | ICD-10-CM | POA: Diagnosis not present

## 2019-12-14 DIAGNOSIS — I5032 Chronic diastolic (congestive) heart failure: Secondary | ICD-10-CM | POA: Diagnosis not present

## 2019-12-14 DIAGNOSIS — R0789 Other chest pain: Secondary | ICD-10-CM | POA: Diagnosis not present

## 2019-12-14 DIAGNOSIS — I110 Hypertensive heart disease with heart failure: Secondary | ICD-10-CM | POA: Diagnosis not present

## 2019-12-14 DIAGNOSIS — R079 Chest pain, unspecified: Secondary | ICD-10-CM

## 2019-12-14 LAB — I-STAT BETA HCG BLOOD, ED (MC, WL, AP ONLY): I-stat hCG, quantitative: <5 m[IU]/mL (ref <5)

## 2019-12-14 LAB — BASIC METABOLIC PANEL
Anion gap: 9 (ref 5–15)
BUN: 16 mg/dL (ref 6–20)
CO2: 26 mmol/L (ref 22–32)
Calcium: 9.2 mg/dL (ref 8.9–10.3)
Chloride: 104 mmol/L (ref 98–111)
Creatinine, Ser: 0.76 mg/dL (ref 0.44–1.00)
GFR calc Af Amer: >60 mL/min (ref >60)
GFR calc non Af Amer: >60 mL/min (ref >60)
Glucose, Bld: 107 mg/dL — ABNORMAL HIGH (ref 70–99)
Potassium: 3.8 mmol/L (ref 3.5–5.1)
Sodium: 139 mmol/L (ref 135–145)

## 2019-12-14 LAB — TROPONIN I (HIGH SENSITIVITY)
Troponin I (High Sensitivity): 3 ng/L (ref <18)
Troponin I (High Sensitivity): 2 ng/L (ref <18)

## 2019-12-14 LAB — CBC
HCT: 35.1 % — ABNORMAL LOW (ref 36.0–46.0)
Hemoglobin: 11.4 g/dL — ABNORMAL LOW (ref 12.0–15.0)
MCH: 28.9 pg (ref 26.0–34.0)
MCHC: 32.5 g/dL (ref 30.0–36.0)
MCV: 88.9 fL (ref 80.0–100.0)
Platelets: 304 10*3/uL (ref 150–400)
RBC: 3.95 MIL/uL (ref 3.87–5.11)
RDW: 14.3 % (ref 11.5–15.5)
WBC: 4.6 10*3/uL (ref 4.0–10.5)
nRBC: 0 % (ref 0.0–0.2)

## 2019-12-14 MED ORDER — FENTANYL CITRATE (PF) 100 MCG/2ML IJ SOLN
50.0000 ug | Freq: Once | INTRAMUSCULAR | Status: AC
  Administered 2019-12-14: 50 ug via INTRAVENOUS
  Filled 2019-12-14: qty 2

## 2019-12-14 NOTE — ED Provider Notes (Signed)
[facility]-EMERGENCY DEPT Provider Note   CSN: [id number] Arrival date & time: 12/14/19  1432     History Chief Complaint  Patient presents with  . Chest Pain    Alicia Wood is a 49 y.o. female.  Patient presents to the ED after being seen at urgent care today with complaint of chest pain onset on Friday. She describes the pain as burning and pressure ("like an elephant sitting on my chest"). The discomfort radiates to the left chest and arm. She has history of hyperlipidemia, pericarditis and pericardial effusion. No known CAD. She endorses recent exertional dyspnea.  The history is provided by the patient. No language interpreter was used.  Chest Pain Pain location:  Substernal area Pain quality: burning and pressure   Pain radiates to:  L arm Pain severity:  Moderate Onset quality:  Gradual Timing:  Constant Progression:  Worsening Chronicity:  New Worsened by:  Movement Associated symptoms: fatigue and shortness of breath   Associated symptoms: no abdominal pain, no back pain and no lower extremity edema   Risk factors: high cholesterol and obesity   Risk factors: no coronary artery disease        Past Medical History:  Diagnosis Date  . Back pain   . Chest pain    a. Initially in setting of pericarditis in 11/2015; b. Ongoing cp despite prednisone, colchicine, ibuprofen,and narcotic therapy;  c. 12/2015 CTA Chest: No PE, stable small-mod pericardial effusion;  d. 12/2015 Cath: nl cors.  . Chest pain   . Chronic diastolic CHF (congestive heart failure) (HCC)    a. 11/2015 Echo: EF 55-60%, grade 2 diastolic dysfxn.  . Depression   . Edema of both lower extremities   . GERD (gastroesophageal reflux disease)   . Headache   . Hyperlipidemia   . Hypertension    Patient states she does not have hypertension.   . Osteoarthritis   . Pericardial effusion    a. 11/23/2015 Echo: mod pericardial effusion, no tamponade; b. 12/05/2015 Echo: small  circumferential pericardial effusion; c. 12/06/2015 Cardiac MRI: pericardial thickening w/ circumferential late gad enhancement of pericardium and epicardium-->suspicious for subacute effusive constrictive pericarditis;  d. 12/19/2015 Echo: triv pericardial effusion;  e. 01/06/2016 CT Chest: small to mod pericard eff.  . Pericarditis    a. 11/2015 Cardiac MRI; EF 57%, mild MR, triv TR, mild pericardial effusion, thickening of pericardium focally up to 61mm w/ significant circumferential late gad enhancement of pericardium and epicardium sugg of subacute effusive constrictive pericarditis;  b. 12/2015 R Heart Cath: nl right heart pressures w/o evidence of constrictive physiology.  . Torn meniscus     Patient Active Problem List   Diagnosis Date Noted  . Morbid obesity (HCC) 06/22/2019  . Prediabetes 06/09/2019  . Vitamin D deficiency 06/09/2019  . Epigastric abdominal tenderness 01/30/2016  . Dysphagia 01/30/2016  . GERD (gastroesophageal reflux disease) 01/30/2016  . Pericarditis 12/17/2015  . Constrictive pericarditis   . Chronic diastolic CHF (congestive heart failure) (HCC)   . Pulmonary hypertension (HCC)   . Precordial pain 12/05/2015  . Hypotension 12/05/2015  . Arterial hypotension   . Pericardial effusion 11/22/2015  . Right-sided low back pain without sciatica 06/16/2014    Past Surgical History:  Procedure Laterality Date  . ABDOMINAL HYSTERECTOMY    . APPENDECTOMY    . CARDIAC CATHETERIZATION N/A 01/21/2016   Procedure: Right/Left Heart Cath and Coronary Angiography;  Surgeon: Kathleene Hazel, MD;  Location: [facility] INVASIVE CV LAB;  Service: Cardiovascular;  Laterality: N/A;  . ESOPHAGOGASTRODUODENOSCOPY (EGD) WITH PROPOFOL N/A 01/30/2016   Procedure: ESOPHAGOGASTRODUODENOSCOPY (EGD) WITH PROPOFOL;  Surgeon: [name], MD;  Location: [facility] ENDOSCOPY;  Service: Endoscopy;  Laterality: N/A;  . PARTIAL HYSTERECTOMY    . TUBAL LIGATION       OB History    Gravida  3    Para  3   Term      Preterm      AB      Living        SAB      TAB      Ectopic      Multiple      Live Births              Family History  Problem Relation Age of Onset  . Hypertension Mother   . Cancer Mother   . Depression Mother   . Healthy Father     Social History   Tobacco Use  . Smoking status: Never Smoker  . Smokeless tobacco: Never Used  Vaping Use  . Vaping Use: Never used  Substance Use Topics  . Alcohol use: Yes    Comment: occasional  . Drug use: No    Home Medications Prior to Admission medications   Medication Sig Start Date End Date Taking? Authorizing Provider  acetaminophen (TYLENOL) 650 MG CR tablet Take 650 mg by mouth every 8 (eight) hours as needed for pain.    [provider]  FLUoxetine (PROZAC) 10 MG capsule Take 1 capsule (10 mg total) by mouth daily. 10/18/19   Roswell Nickel, DO  Naltrexone-buPROPion HCl ER (CONTRAVE) 8-90 MG TB12 Take two tabs po am and two tabs po evening 10/18/19   Corinna Capra A, DO  Vitamin D, Ergocalciferol, (DRISDOL) 1.25 MG (50000 UNIT) CAPS capsule Take 1 capsule (50,000 Units total) by mouth every 7 (seven) days. 09/12/19   Roswell Nickel, DO    Allergies    Patient has no known allergies.  Review of Systems   Review of Systems  Constitutional: Positive for fatigue.  Respiratory: Positive for shortness of breath.   Cardiovascular: Positive for chest pain. Negative for leg swelling.  Gastrointestinal: Negative for abdominal pain.  Musculoskeletal: Negative for back pain.  All other systems reviewed and are negative.   Physical Exam Updated Vital Signs BP (!) 156/93 (BP Location: Right Arm)   Pulse 65   Temp 98.2 F (36.8 C) (Oral)   Resp 18   SpO2 100%   Physical Exam Vitals and nursing note reviewed.  Constitutional:      Appearance: She is obese.  HENT:     Head: Normocephalic.     Mouth/Throat:     Mouth: Mucous membranes are moist.  Eyes:     Conjunctiva/sclera:  Conjunctivae normal.  Cardiovascular:     Rate and Rhythm: Normal rate and regular rhythm.     Pulses:          Dorsalis pedis pulses are 2+ on the right side and 2+ on the left side.  Pulmonary:     Breath sounds: Normal breath sounds.  Abdominal:     Palpations: Abdomen is soft.  Musculoskeletal:     Cervical back: Neck supple.     Right lower leg: No edema.     Left lower leg: No edema.  Skin:    General: Skin is warm and dry.  Neurological:     Mental Status: She is alert and oriented to person, place, and time.  Psychiatric:        Mood and Affect: Mood normal.        Behavior: Behavior normal.     ED Results / Procedures / Treatments   Labs (all labs ordered are listed, but only abnormal results are displayed) Labs Reviewed  BASIC METABOLIC PANEL - Abnormal; Notable for the following components:      Result Value   Glucose, Bld 107 (*)    All other components within normal limits  CBC - Abnormal; Notable for the following components:   Hemoglobin 11.4 (*)    HCT 35.1 (*)    All other components within normal limits  I-STAT BETA HCG BLOOD, ED ([facility], [facility], [facility] ONLY)  TROPONIN I (HIGH SENSITIVITY)  TROPONIN I (HIGH SENSITIVITY)    EKG EKG Interpretation  Date/Time:  Wednesday December 14 2019 14:47:52 EDT Ventricular Rate:  67 PR Interval:    QRS Duration: 87 QT Interval:  437 QTC Calculation: 462 R Axis:   34 Text Interpretation: Sinus rhythm Low voltage, precordial leads Abnormal R-wave progression, early transition 12 Lead; Mason-Likar No significant change since last tracing Confirmed by Gwyneth Sprout ([id number]) on 12/14/2019 4:14:05 PM   Radiology DG Chest 2 View  Result Date: 12/14/2019 CLINICAL DATA:  Chest pain, fatigue, shortness of breath. EXAM: CHEST - 2 VIEW COMPARISON:  Chest x-ray dated February 28, 2019. FINDINGS: The heart size and mediastinal contours are within normal limits. Both lungs are clear. The visualized skeletal structures are  unremarkable. IMPRESSION: No active cardiopulmonary disease. Electronically Signed   By: Obie Dredge M.D.   On: 12/14/2019 15:36    Procedures Procedures (including critical care time)  Medications Ordered in ED Medications - No data to display  ED Course  I have reviewed the triage vital signs and the nursing notes.  Pertinent labs & imaging results that were available during my care of the patient were reviewed by me and considered in my medical decision making (see chart for details).    MDM Rules/Calculators/A&P                          Patient is to be discharged with recommendation to follow up with PCP in regards to today's hospital visit. Chest pain is not likely of cardiac or pulmonary etiology d/t presentation, perc negative, VSS, no tracheal deviation, no JVD or new murmur, RRR, breath sounds equal bilaterally, EKG without acute abnormalities, negative troponin, and negative CXR. Pt has been advised to return to the ED is CP becomes exertional, associated with diaphoresis or nausea, radiates to left jaw/arm, worsens or becomes concerning in any way. Pt appears reliable for follow up and is agreeable to discharge.   Case has been discussed with Dr. Particia Nearing who agrees with the above plan to discharge.  Final Clinical Impression(s) / ED Diagnoses Final diagnoses:  Atypical chest pain    Rx / DC Orders ED Discharge Orders    None       Felicie Morn, NP 12/14/19 2129    Gwyneth Sprout, MD 12/15/19 1236

## 2019-12-14 NOTE — ED Notes (Addendum)
O2 was 100% while ambulating

## 2019-12-14 NOTE — ED Notes (Signed)
Per T. Bast, NP, pt advised to go to ED STAT 2/2 CP, numbness in left arm.  Patient is being discharged from the Urgent Care and sent to the Emergency Department via POV. Per T. Bast, NP, patient is in need of higher level of care due to CP, left arm weakness. Patient is aware and verbalizes understanding of plan of care.  Vitals:   12/14/19 1402  BP: 123/85  Pulse: 64  Resp: 20  Temp: 98.9 F (37.2 C)  SpO2: 97%

## 2019-12-14 NOTE — ED Triage Notes (Signed)
Per pt, states CP radiating to left arm since last Friday-went to UC and they told her to come to ED-history of pericarditis

## 2019-12-14 NOTE — Discharge Instructions (Addendum)
Please refer to attached instructions. Follow-up with cardiology as discussed.

## 2019-12-14 NOTE — ED Triage Notes (Signed)
Pt presents with chest pain, fatigue, headache, shortness of breath on exertion  and numbness in the left ram when laying on the left side x 5 days. Pt states she felt the same when he had pericarditis 3 years ago.

## 2019-12-26 ENCOUNTER — Other Ambulatory Visit (HOSPITAL_COMMUNITY): Payer: Self-pay | Admitting: Family Medicine

## 2019-12-26 DIAGNOSIS — Z8679 Personal history of other diseases of the circulatory system: Secondary | ICD-10-CM | POA: Diagnosis not present

## 2019-12-26 DIAGNOSIS — R079 Chest pain, unspecified: Secondary | ICD-10-CM | POA: Diagnosis not present

## 2019-12-27 ENCOUNTER — Telehealth: Payer: Self-pay

## 2019-12-27 NOTE — Telephone Encounter (Signed)
Referral from [facility] at [facility] Dr. Arman Filter (P) [phone] Sent to PCC/Scheduling Notes in Chart Prep

## 2020-01-17 DIAGNOSIS — I319 Disease of pericardium, unspecified: Secondary | ICD-10-CM | POA: Diagnosis not present

## 2020-01-18 DIAGNOSIS — I10 Essential (primary) hypertension: Secondary | ICD-10-CM | POA: Insufficient documentation

## 2020-01-18 NOTE — Progress Notes (Signed)
Cardiology Office Note   Date:  01/19/2020   ID:  Alicia Wood, DOB 1970/08/29, MRN [id number]  PCP:  Shon Hale, MD  Cardiologist:   Rollene Rotunda, MD Referring:  Shon Hale, MD    Chief Complaint  Patient presents with  . Chest Pain      History of Present Illness: Alicia Wood is a 49 y.o. female who is referred by Shon Hale, MD for evaluation of chest pain.     She was in the ED in late Sept for this.  I reviewed these records for this visit.  Trop was negative and there were no acute changes on echo.   She did have pericarditis in 2017.  I saw her last over 3 years ago.      She says the pain actually started in August.  It is the same as what she had previously.  She says it is exactly the same as the previous pericarditis with substernal discomfort.  Difficult to breathe or find a comfortable position.  She finally went to see her primary provider after getting no treatment or relief in the emergency room.  I was able to review the emergency room records and there were no acute ST segment changes in September.  Her primary care provider started her on steroids and colchicine and she has had improvement of her symptoms.  She is not having any new shortness of breath, PND or orthopnea.  She is no longer getting the chest discomfort.  She is disappointed because the steroids made her gain weight.  She has not had any cough fevers or chills.  Past Medical History:  Diagnosis Date  . Back pain   . Chest pain    a. Initially in setting of pericarditis in 11/2015; b. Ongoing cp despite prednisone, colchicine, ibuprofen,and narcotic therapy;  c. 12/2015 CTA Chest: No PE, stable small-mod pericardial effusion;  d. 12/2015 Cath: nl cors.  . Chest pain   . Chronic diastolic CHF (congestive heart failure) (HCC)    a. 11/2015 Echo: EF 55-60%, grade 2 diastolic dysfxn.  . Depression   . Edema of both lower extremities   . GERD (gastroesophageal  reflux disease)   . Headache   . Hyperlipidemia   . Hypertension    Patient states she does not have hypertension.   . Osteoarthritis   . Pericardial effusion    a. 11/23/2015 Echo: mod pericardial effusion, no tamponade; b. 12/05/2015 Echo: small circumferential pericardial effusion; c. 12/06/2015 Cardiac MRI: pericardial thickening w/ circumferential late gad enhancement of pericardium and epicardium-->suspicious for subacute effusive constrictive pericarditis;  d. 12/19/2015 Echo: triv pericardial effusion;  e. 01/06/2016 CT Chest: small to mod pericard eff.  . Pericarditis    a. 11/2015 Cardiac MRI; EF 57%, mild MR, triv TR, mild pericardial effusion, thickening of pericardium focally up to 4mm w/ significant circumferential late gad enhancement of pericardium and epicardium sugg of subacute effusive constrictive pericarditis;  b. 12/2015 R Heart Cath: nl right heart pressures w/o evidence of constrictive physiology.  . Torn meniscus     Past Surgical History:  Procedure Laterality Date  . ABDOMINAL HYSTERECTOMY    . APPENDECTOMY    . CARDIAC CATHETERIZATION N/A 01/21/2016   Procedure: Right/Left Heart Cath and Coronary Angiography;  Surgeon: Kathleene Hazel, MD;  Location: [facility] INVASIVE CV LAB;  Service: Cardiovascular;  Laterality: N/A;  . ESOPHAGOGASTRODUODENOSCOPY (EGD) WITH PROPOFOL N/A 01/30/2016   Procedure: ESOPHAGOGASTRODUODENOSCOPY (EGD) WITH PROPOFOL;  Surgeon: [name],  MD;  Location: [facility] ENDOSCOPY;  Service: Endoscopy;  Laterality: N/A;  . PARTIAL HYSTERECTOMY    . TUBAL LIGATION       Current Outpatient Medications  Medication Sig Dispense Refill  . acetaminophen (TYLENOL) 650 MG CR tablet Take 650 mg by mouth every 8 (eight) hours as needed for pain. Yesterday she took 1950 mg ( 3 tablets) ay 6 pm.    . albuterol (VENTOLIN HFA) 108 (90 Base) MCG/ACT inhaler Inhale 1 puff into the lungs every 4 (four) hours as needed for wheezing or shortness of breath.    .  colchicine 0.6 MG tablet Take 0.6 mg by mouth 2 (two) times daily.    . Naltrexone-buPROPion HCl ER (CONTRAVE) 8-90 MG TB12 Take two tabs po am and two tabs po evening (Patient not taking: Reported on 01/19/2020) 120 tablet 0  . Vitamin D, Ergocalciferol, (DRISDOL) 1.25 MG (50000 UNIT) CAPS capsule Take 1 capsule (50,000 Units total) by mouth every 7 (seven) days. (Patient not taking: Reported on 01/19/2020) 4 capsule 0   No current facility-administered medications for this visit.    Allergies:   Patient has no known allergies.    Social History:  The patient  reports that she has never smoked. She has never used smokeless tobacco. She reports current alcohol use. She reports that she does not use drugs.   Family History:  The patient's family history includes Cancer in her mother; Depression in her mother; Healthy in her father; Hypertension in her mother.    ROS:  Please see the history of present illness.   Otherwise, review of systems are positive for none.   All other systems are reviewed and negative.    PHYSICAL EXAM: VS:  BP (!) 139/92   Pulse 76   Ht 5\' 9"  (1.753 m)   Wt 287 lb (130.2 kg)   SpO2 100%   BMI 42.38 kg/m  , BMI Body mass index is 42.38 kg/m. GENERAL:  Well appearing HEENT:  Pupils equal round and reactive, fundi not visualized, oral mucosa unremarkable NECK:  No jugular venous distention, waveform within normal limits, carotid upstroke brisk and symmetric, no bruits, no thyromegaly LYMPHATICS:  No cervical, inguinal adenopathy LUNGS:  Clear to auscultation bilaterally BACK:  No CVA tenderness CHEST:  Unremarkable HEART:  PMI not displaced or sustained,S1 and S2 within normal limits, no S3, no S4, no clicks, no rubs, no murmurs ABD:  Flat, positive bowel sounds normal in frequency in pitch, no bruits, no rebound, no guarding, no midline pulsatile mass, no hepatomegaly, no splenomegaly EXT:  2 plus pulses throughout, no edema, no cyanosis no clubbing SKIN:   No rashes no nodules NEURO:  Cranial nerves II through XII grossly intact, motor grossly intact throughout PSYCH:  Cognitively intact, oriented to person place and time    EKG:  EKG is not ordered today. The ekg ordered December 14, 2019 demonstrates normal sinus rhythm, rate 67, axis within normal limits, intervals within normal limits, no acute ST-T wave changes.   Recent Labs: 06/08/2019: ALT 16; TSH 0.624 12/14/2019: BUN 16; Creatinine, Ser 0.76; Hemoglobin 11.4; Platelets 304; Potassium 3.8; Sodium 139    Lipid Panel No results found for: CHOL, TRIG, HDL, CHOLHDL, VLDL, LDLCALC, LDLDIRECT    Wt Readings from Last 3 Encounters:  01/19/20 287 lb (130.2 kg)  12/14/19 279 lb (126.6 kg)  10/18/19 (!) 283 lb (128.4 kg)      Other studies Reviewed: Additional studies/ records that were reviewed today include: ED records  primary care office records. Review of the above records demonstrates:  Please see elsewhere in the note.     ASSESSMENT AND PLAN:  CHEST PAIN: I will suspect that this was pericarditis.  She is improved with the course of steroids which is now completed.  I will continue the colchicine for an extended period given her recurrent history of this previously.  I like her to be on it for about 2 months until I see her back.  I will be checking an echocardiogram.  Of note her primary care requested referral to cardiac rehab which I think is reasonable.  HISTORY OF PERICARDITIS: As above.  HTN: Her blood pressure is controlled.  No change in therapy.  CHRONIC DIASTOLIC DYSFUNCTION: She seems to be euvolemic.  No change in therapy.  Current medicines are reviewed at length with the patient today.  The patient does not have concerns regarding medicines.  The following changes have been made: As above  Labs/ tests ordered today include:   Orders Placed This Encounter  Procedures  . AMB referral to cardiac rehabilitation     Disposition:   FU with me in 2  months   Signed, Rollene Rotunda, MD  01/19/2020 3:13 PM    [facility]

## 2020-01-19 ENCOUNTER — Ambulatory Visit: Payer: 59 | Admitting: Cardiology

## 2020-01-19 ENCOUNTER — Other Ambulatory Visit: Payer: Self-pay

## 2020-01-19 ENCOUNTER — Encounter: Payer: Self-pay | Admitting: Cardiology

## 2020-01-19 VITALS — BP 139/92 | HR 76 | Ht 69.0 in | Wt 287.0 lb

## 2020-01-19 DIAGNOSIS — I10 Essential (primary) hypertension: Secondary | ICD-10-CM

## 2020-01-19 DIAGNOSIS — I5032 Chronic diastolic (congestive) heart failure: Secondary | ICD-10-CM | POA: Diagnosis not present

## 2020-01-19 DIAGNOSIS — I319 Disease of pericardium, unspecified: Secondary | ICD-10-CM | POA: Diagnosis not present

## 2020-01-19 DIAGNOSIS — R072 Precordial pain: Secondary | ICD-10-CM

## 2020-01-19 NOTE — Patient Instructions (Addendum)
Medication Instructions:  No changes *If you need a refill on your cardiac medications before your next appointment, please call your pharmacy*   Lab Work: None ordered If you have labs (blood work) drawn today and your tests are completely normal, you will receive your results only by: Marland Kitchen MyChart Message (if you have MyChart) OR . A paper copy in the mail If you have any lab test that is abnormal or we need to change your treatment, we will call you to review the results.   Testing/Procedures: Your physician has requested that you have an echocardiogram. Echocardiography is a painless test that uses sound waves to create images of your heart. It provides your doctor with information about the size and shape of your heart and how well your heart's chambers and valves are working. This procedure takes approximately one hour. There are no restrictions for this procedure. This test is performed at [address].   Follow-Up: At [facility], you and your health needs are our priority.  As part of our continuing mission to provide you with exceptional heart care, we have created designated Provider Care Teams.  These Care Teams include your primary Cardiologist (physician) and Advanced Practice Providers (APPs -  Physician Assistants and Nurse Practitioners) who all work together to provide you with the care you need, when you need it.  We recommend signing up for the patient portal called "MyChart".  Sign up information is provided on this After Visit Summary.  MyChart is used to connect with patients for Virtual Visits (Telemedicine).  Patients are able to view lab/test results, encounter notes, upcoming appointments, etc.  Non-urgent messages can be sent to your provider as well.   To learn more about what you can do with MyChart, go to [email or web address].    Your next appointment:   2 month(s)  The format for your next appointment:   In Person  Provider:   Rollene Rotunda,  MD   Other Instructions You have been referred to Cardiac Rehab. They will reach out to you with more information.

## 2020-01-26 DIAGNOSIS — I319 Disease of pericardium, unspecified: Secondary | ICD-10-CM | POA: Diagnosis not present

## 2020-01-26 DIAGNOSIS — Z23 Encounter for immunization: Secondary | ICD-10-CM | POA: Diagnosis not present

## 2020-01-27 ENCOUNTER — Other Ambulatory Visit: Payer: Self-pay | Admitting: Family Medicine

## 2020-01-27 ENCOUNTER — Encounter (HOSPITAL_COMMUNITY): Payer: Self-pay | Admitting: *Deleted

## 2020-01-27 DIAGNOSIS — Z1231 Encounter for screening mammogram for malignant neoplasm of breast: Secondary | ICD-10-CM

## 2020-01-27 NOTE — Progress Notes (Signed)
Received referral from Dr. [name] for this pt to participate in Cardiac rehab with the diagnosis of Pericarditis. Clinical review of pt follow up appt on 10/28 with Dr. [name] - cardiologist office note. Also reviewed ER visit notes on 9/22 for atypical chest pain. Pt is making the expected progress in recovery.  Pt appropriate for scheduling for on site cardiac rehab and/or enrollment in Virtual Cardiac Rehab.  Pt Covid Risk Score is 3,  Will forward to staff for follow up. Alanson Aly, BSN Cardiac and Emergency planning/management officer

## 2020-01-30 ENCOUNTER — Ambulatory Visit
Admission: RE | Admit: 2020-01-30 | Discharge: 2020-01-30 | Disposition: A | Discharge status: Home / Self Care | Payer: 59 | Source: Ambulatory Visit | Attending: Family Medicine | Admitting: Family Medicine

## 2020-01-30 ENCOUNTER — Other Ambulatory Visit: Payer: Self-pay

## 2020-01-30 DIAGNOSIS — Z1231 Encounter for screening mammogram for malignant neoplasm of breast: Secondary | ICD-10-CM

## 2020-02-01 ENCOUNTER — Telehealth (HOSPITAL_COMMUNITY): Payer: Self-pay

## 2020-02-01 ENCOUNTER — Encounter (HOSPITAL_COMMUNITY): Payer: Self-pay

## 2020-02-01 NOTE — Telephone Encounter (Signed)
Attempted to call patient in regards to Cardiac Rehab - LM on VM Mailed letter 

## 2020-02-01 NOTE — Telephone Encounter (Signed)
Pt insurance is active and benefits verified through [employer]. Co-pay $0.00, DED $300.00/$300.00 met, out of pocket $7,900.00/$1,303.26 met, co-insurance 20%. No pre-authorization required. Terri/[facility], 02/01/20 @ 401PM, REF#[id number]  Will contact patient to see if she is interested in the Cardiac Rehab Program. 

## 2020-02-06 ENCOUNTER — Telehealth (HOSPITAL_COMMUNITY): Payer: Self-pay

## 2020-02-06 NOTE — Telephone Encounter (Signed)
Pt returned CR phone call and stated she is interested in CR. Patient will come in for orientation on 02/23/20 @ 930AM and will attend the 915AM exercise class. Went over insurance, patient verbalized understanding.   Dispensing optician.

## 2020-02-14 ENCOUNTER — Other Ambulatory Visit: Payer: Self-pay

## 2020-02-14 ENCOUNTER — Ambulatory Visit (HOSPITAL_COMMUNITY): Payer: 59 | Attending: Internal Medicine

## 2020-02-14 DIAGNOSIS — R079 Chest pain, unspecified: Secondary | ICD-10-CM | POA: Diagnosis not present

## 2020-02-14 DIAGNOSIS — I319 Disease of pericardium, unspecified: Secondary | ICD-10-CM

## 2020-02-14 LAB — ECHOCARDIOGRAM COMPLETE
Area-P 1/2: 3.99 cm2
P 1/2 time: 495 msec
S' Lateral: 3.4 cm

## 2020-02-15 ENCOUNTER — Telehealth (HOSPITAL_COMMUNITY): Payer: Self-pay | Admitting: *Deleted

## 2020-02-20 ENCOUNTER — Telehealth (HOSPITAL_COMMUNITY): Payer: Self-pay

## 2020-02-20 ENCOUNTER — Other Ambulatory Visit: Payer: Self-pay

## 2020-02-20 ENCOUNTER — Telehealth (HOSPITAL_COMMUNITY): Payer: Self-pay | Admitting: Family Medicine

## 2020-02-20 ENCOUNTER — Encounter (HOSPITAL_COMMUNITY)
Admission: RE | Admit: 2020-02-20 | Discharge: 2020-02-20 | Disposition: A | Discharge status: Home / Self Care | Payer: 59 | Source: Ambulatory Visit | Attending: Cardiology | Admitting: Cardiology

## 2020-02-20 DIAGNOSIS — I300 Acute nonspecific idiopathic pericarditis: Secondary | ICD-10-CM | POA: Insufficient documentation

## 2020-02-20 NOTE — Telephone Encounter (Signed)
Cardiac Rehab Note:  Second unsuccessful telephone encounter to Ophelia Shoulder to confirm cardiac rehab orientation appointment Thursday, 01/24/20 at 9:30 am. [employer] compliant VM message left requesting call back to [phone].  Britanny Marksberry E. Suzie Portela RN, BSN [place]. [facility]  Cardiac and Pulmonary Rehabilitation Phone: [phone] Fax: [phone]

## 2020-02-20 NOTE — Progress Notes (Signed)
Cardiac Rehab Telephone Note:  Successful telephone encounter to Alicia Wood to confirm Cardiac Rehab orientation appointment for 02/23/20 at 9:30 am. Nursing assessment completed. Patient questions answered. Instructions for appointment provided. Patient screening for Covid-19 negative.  Azyria Osmon E. Suzie Portela RN, BSN [place]. [facility]  Cardiac and Pulmonary Rehabilitation Phone: [phone] Fax: [phone]

## 2020-02-20 NOTE — Telephone Encounter (Signed)
Cardiac Rehab Medication Review by a Pharmacist  Does the patient  feel that his/her medications are working for him/her?  She states she is no longer on daily medications and has not felt that she needs any currently.   Has the patient been experiencing any side effects to the medications prescribed?  no  Does the patient measure his/her own blood pressure or blood glucose at home?  no   Does the patient have any problems obtaining medications due to transportation or finances?   no  Understanding of regimen: good Understanding of indications: good Potential of compliance: good   Pharmacist Comments: Patient states that she was only on colchicine for a short period and is no longer taking it. Rarely needs PRN medications. She is unsure if her PCP or cardiologist will change medications in the future but at this time she is not on any daily medications.   Trixie Rude, PharmD PGY1 Acute Care Pharmacy Resident 02/20/2020 5:20 PM

## 2020-02-23 ENCOUNTER — Encounter (HOSPITAL_COMMUNITY)
Admission: RE | Admit: 2020-02-23 | Discharge: 2020-02-23 | Disposition: A | Discharge status: Home / Self Care | Payer: 59 | Source: Ambulatory Visit | Attending: Cardiology | Admitting: Cardiology

## 2020-02-23 ENCOUNTER — Encounter (HOSPITAL_COMMUNITY): Payer: Self-pay

## 2020-02-23 ENCOUNTER — Other Ambulatory Visit: Payer: Self-pay

## 2020-02-23 VITALS — BP 124/60 | Ht 67.5 in | Wt 295.6 lb

## 2020-02-23 DIAGNOSIS — I319 Disease of pericardium, unspecified: Secondary | ICD-10-CM | POA: Insufficient documentation

## 2020-02-23 DIAGNOSIS — I300 Acute nonspecific idiopathic pericarditis: Secondary | ICD-10-CM | POA: Insufficient documentation

## 2020-02-23 NOTE — Progress Notes (Addendum)
Cardiac Individual Treatment Plan  Patient Details  Name: Alicia Wood MRN: [id number] Date of Birth: 04/15/1970 Referring Provider:     [facility]  Referring Provider Dr. Rollene RotundaJames Hochrein, MD      Initial Encounter Date:    CARDIAC REHAB PHASE II [facility]IENTATION [facility]23[facility]2020 [facility]  Date 02/23/20      Visit Diagnosis: Chronic pericarditis, unspecified complication status, unspecified type  Patient's Home Medications on Admission:  Current Outpatient Medications:  .  acetaminophen (TYLENOL) 650 MG CR tablet, Take 650 mg by mouth every 8 (eight) hours as needed for pain. Yesterday she took 1950 mg ( 3 tablets) ay 6 pm., Disp: , Rfl:  .  albuterol (VENTOLIN HFA) 108 (90 Base) MCG/ACT inhaler, Inhale 1 puff into the lungs every 4 (four) hours as needed for wheezing or shortness of breath., Disp: , Rfl:   Past Medical History: Past Medical History:  Diagnosis Date  . Back pain   . Chest pain    a. Initially in setting of pericarditis in 11/2015; b. Ongoing cp despite prednisone, colchicine, ibuprofen,and narcotic therapy;  c. 12/2015 CTA Chest: No PE, stable small-mod pericardial effusion;  d. 12/2015 Cath: nl cors.  . Chest pain   . Chronic diastolic CHF (congestive heart failure) (HCC)    a. 11/2015 Echo: EF 55-60%, grade 2 diastolic dysfxn.  . Depression   . Edema of both lower extremities   . GERD (gastroesophageal reflux disease)   . Headache   . Hyperlipidemia   . Hypertension    Patient states she does not have hypertension.   . Osteoarthritis   . Pericardial effusion    a. 11/23/2015 Echo: mod pericardial effusion, no tamponade; b. 12/05/2015 Echo: small circumferential pericardial effusion; c. 12/06/2015 Cardiac MRI: pericardial thickening w/ circumferential late gad enhancement of pericardium and epicardium-->suspicious for subacute effusive  constrictive pericarditis;  d. 12/19/2015 Echo: triv pericardial effusion;  e. 01/06/2016 CT Chest: small to mod pericard eff.  . Pericarditis    a. 11/2015 Cardiac MRI; EF 57%, mild MR, triv TR, mild pericardial effusion, thickening of pericardium focally up to 8mm w/ significant circumferential late gad enhancement of pericardium and epicardium sugg of subacute effusive constrictive pericarditis;  b. 12/2015 R Heart Cath: nl right heart pressures w/o evidence of constrictive physiology.  . Torn meniscus     Tobacco Use: Social History   Tobacco Use  Smoking Status Never Smoker  Smokeless Tobacco Never Used    Labs: Recent Review Flowsheet Data    Labs for ITP Cardiac and Pulmonary Rehab Latest Ref Rng & Units 01/21/2016 01/21/2016 06/08/2019   Hemoglobin A1c 4.8 - 5.6 % - - 6.2(H)   PHART 7.35 - 7.45 7.373 - -   PCO2ART 32 - 48 mmHg 54.7(H) - -   HCO3 20.0 - 28.0 mmol/L 31.9(H) 30.7(H) -   TCO2 0 - 100 mmol/L 33 32 -   O2SAT % 94.0 74.0 -      Capillary Blood Glucose: Lab Results  Component Value Date   GLUCAP 92 01/21/2016   GLUCAP 99 12/10/2015   GLUCAP 110 (H) 12/10/2015   GLUCAP 138 (H) 12/09/2015   GLUCAP 112 (H) 12/09/2015     Exercise Target Goals: Exercise Program Goal: Individual exercise prescription set using results from initial 6 min walk test and THRR while considering  patient's activity barriers and safety.   Exercise Prescription Goal: Starting with  aerobic activity 30 plus minutes a day, 3 days per week for initial exercise prescription. Provide home exercise prescription and guidelines that participant acknowledges understanding prior to discharge.  Activity Barriers & Risk Stratification:  Activity Barriers & Cardiac Risk Stratification - 02/23/20 1329      Activity Barriers & Cardiac Risk Stratification   Activity Barriers Joint Problems;History of Falls    Cardiac Risk Stratification Moderate           6 Minute Walk:  6 Minute Walk    Row  Name 02/23/20 1038         6 Minute Walk   Phase Initial     Distance 1400 feet     Walk Time 6 minutes     # of Rest Breaks 0     MPH 2.7     METS 3.1     RPE 11     Perceived Dyspnea  0     VO2 Peak 11     Symptoms No     Resting HR 67 bpm     Resting BP 124/60     Resting Oxygen Saturation  99 %     Exercise Oxygen Saturation  during 6 min walk 99 %     Max Ex. HR 120 bpm     Max Ex. BP 118/70     2 Minute Post BP 112/70            Oxygen Initial Assessment:   Oxygen Re-Evaluation:   Oxygen Discharge (Final Oxygen Re-Evaluation):   Initial Exercise Prescription:  Initial Exercise Prescription - 02/23/20 1300      Date of Initial Exercise RX and Referring Provider   Date 02/23/20    Referring Provider Dr. Rollene Rotunda, MD    Expected Discharge Date 04/20/20      Treadmill   MPH 2.4    Grade 1    Minutes 15    METs 3.17      NuStep   Level 2    SPM 85    Minutes 15    METs 2      Prescription Details   Frequency (times per week) 3    Duration Progress to 30 minutes of continuous aerobic without signs/symptoms of physical distress      Intensity   THRR 40-80% of Max Heartrate 68-135    Ratings of Perceived Exertion 11-13    Perceived Dyspnea 0-4      Progression   Progression Continue progressive overload as per policy without signs/symptoms or physical distress.      Resistance Training   Training Prescription Yes    Weight 3 lbs    Reps 10-15           Perform Capillary Blood Glucose checks as needed.  Exercise Prescription Changes:   Exercise Comments:   Exercise Goals and Review:   Exercise Goals    Row Name 02/23/20 1420             Exercise Goals   Increase Physical Activity Yes       Intervention Provide advice, education, support and counseling about physical activity/exercise needs.;Develop an individualized exercise prescription for aerobic and resistive training based on initial evaluation findings, risk  stratification, comorbidities and participant's personal goals.       Expected Outcomes Short Term: Attend rehab on a regular basis to increase amount of physical activity.;Long Term: Add in home exercise to make exercise part of routine and to increase amount of physical activity.;Long  Term: Exercising regularly at least 3-5 days a week.       Increase Strength and Stamina Yes       Intervention Provide advice, education, support and counseling about physical activity/exercise needs.;Develop an individualized exercise prescription for aerobic and resistive training based on initial evaluation findings, risk stratification, comorbidities and participant's personal goals.       Expected Outcomes Short Term: Increase workloads from initial exercise prescription for resistance, speed, and METs.;Short Term: Perform resistance training exercises routinely during rehab and add in resistance training at home;Long Term: Improve cardiorespiratory fitness, muscular endurance and strength as measured by increased METs and functional capacity ( )       Able to understand and use rate of perceived exertion (RPE) scale Yes       Intervention Provide education and explanation on how to use RPE scale       Expected Outcomes Short Term: Able to use RPE daily in rehab to express subjective intensity level;Long Term:  Able to use RPE to guide intensity level when exercising independently       Knowledge and understanding of Target Heart Rate Range (THRR) Yes       Intervention Provide education and explanation of THRR including how the numbers were predicted and where they are located for reference       Expected Outcomes Short Term: Able to state/look up THRR;Long Term: Able to use THRR to govern intensity when exercising independently;Short Term: Able to use daily as guideline for intensity in rehab       Understanding of Exercise Prescription Yes       Intervention Provide education, explanation, and written  materials on patient's individual exercise prescription       Expected Outcomes Short Term: Able to explain program exercise prescription;Long Term: Able to explain home exercise prescription to exercise independently              Exercise Goals Re-Evaluation :    Discharge Exercise Prescription (Final Exercise Prescription Changes):   Nutrition:  Target Goals: Understanding of nutrition guidelines, daily intake of sodium 1500mg , cholesterol 200mg , calories 30% from fat and 7% or less from saturated fats, daily to have 5 or more servings of fruits and vegetables.  Biometrics:  Pre Biometrics - 02/23/20 0930      Pre Biometrics   Waist Circumference 54 inches    Hip Circumference 59.25 inches    Waist to Hip Ratio 0.91 %    Triceps Skinfold 55 mm    % Body Fat 57.1 %    Grip Strength 44 kg    Flexibility 11.75 in    Single Leg Stand 9.31 seconds   Moderate fall risk           Nutrition Therapy Plan and Nutrition Goals:   Nutrition Assessments:  MEDIFICTS Score Key:  ?70 Need to make dietary changes   40-70 Heart Healthy Diet  ? 40 Therapeutic Level Cholesterol Diet   Picture Your Plate Scores:  <29 Unhealthy dietary pattern with much room for improvement.  41-50 Dietary pattern unlikely to meet recommendations for good health and room for improvement.  51-60 More healthful dietary pattern, with some room for improvement.   >60 Healthy dietary pattern, although there may be some specific behaviors that could be improved.    Nutrition Goals Re-Evaluation:   Nutrition Goals Discharge (Final Nutrition Goals Re-Evaluation):   Psychosocial: Target Goals: Acknowledge presence or absence of significant depression and/or stress, maximize coping skills, provide positive support system. Participant  is able to verbalize types and ability to use techniques and skills needed for reducing stress and depression.  Initial Review & Psychosocial Screening:  Initial  Psych Review & Screening - 02/23/20 1501      Initial Review   Current issues with History of Depression;Current Stress Concerns    Source of Stress Concerns Occupation;Chronic Illness    Comments [name] has health concerns regarding having pericarditis and works two jobs at night.      Family Dynamics   Good Support System? Yes   [name] recently got married and has her husband and children for support     Barriers   Psychosocial barriers to participate in program The patient should benefit from training in stress management and relaxation.      Screening Interventions   Interventions Encouraged to exercise    Expected Outcomes Long Term Goal: Stressors or current issues are controlled or eliminated.;Short Term goal: Identification and review with participant of any Quality of Life or Depression concerns found by scoring the questionnaire.;Long Term goal: The participant improves quality of Life and PHQ9 Scores as seen by post scores and/or verbalization of changes           Quality of Life Scores:  Quality of Life - 02/23/20 1322      Quality of Life   Select Quality of Life      Quality of Life Scores   Health/Function Pre 21.03 %    Socioeconomic Pre 22.29 %    Psych/Spiritual Pre 22.29 %    Family Pre 27.6 %    GLOBAL Pre 22.51 %          Scores of 19 and below usually indicate a poorer quality of life in these areas.  A difference of  2-3 points is a clinically meaningful difference.  A difference of 2-3 points in the total score of the Quality of Life Index has been associated with significant improvement in overall quality of life, self-image, physical symptoms, and general health in studies assessing change in quality of life.  PHQ-9: Recent Review Flowsheet Data    Depression screen [facility] 2/9 02/23/2020 02/23/2020 06/08/2019 01/28/2016 01/16/2016   Decreased Interest 0 0 2 0 2   Down, Depressed, Hopeless 0 0 1 0 2   PHQ - 2 Score 0 0 3 0 4   Altered sleeping - - 2 - 3    Tired, decreased energy - - 1 - 2   Change in appetite - - 2 - 2   Feeling bad or failure about yourself  - - 0 - 0   Trouble concentrating - - 0 - 2   Moving slowly or fidgety/restless - - 0 - 0   Suicidal thoughts - - 0 - 0   PHQ-9 Score - - 8 - 13   Difficult doing work/chores - - Not difficult at all - Not difficult at all     Interpretation of Total Score  Total Score Depression Severity:  1-4 = Minimal depression, 5-9 = Mild depression, 10-14 = Moderate depression, 15-19 = Moderately severe depression, 20-27 = Severe depression   Psychosocial Evaluation and Intervention:   Psychosocial Re-Evaluation:   Psychosocial Discharge (Final Psychosocial Re-Evaluation):   Vocational Rehabilitation: Provide vocational rehab assistance to qualifying candidates.   Vocational Rehab Evaluation & Intervention:  Vocational Rehab - 02/23/20 1216      Initial Vocational Rehab Evaluation & Intervention   Assessment shows need for Vocational Rehabilitation No      Vocational  Rehab Re-Evaulation   Comments [name] is currently working full time and does not need vocational rehab at this time           Education: Education Goals: Education classes will be provided on a weekly basis, covering required topics. Participant will state understanding/return demonstration of topics presented.  Learning Barriers/Preferences:  Learning Barriers/Preferences - 02/23/20 1323      Learning Barriers/Preferences   Learning Barriers None    Learning Preferences None           Education Topics: Hypertension, Hypertension Reduction -Define heart disease and high blood pressure. Discus how high blood pressure affects the body and ways to reduce high blood pressure.   Exercise and Your Heart -Discuss why it is important to exercise, the FITT principles of exercise, normal and abnormal responses to exercise, and how to exercise safely.   Angina -Discuss definition of angina, causes of angina,  treatment of angina, and how to decrease risk of having angina.   Cardiac Medications -Review what the following cardiac medications are used for, how they affect the body, and side effects that may occur when taking the medications.  Medications include Aspirin, Beta blockers, calcium channel blockers, ACE Inhibitors, angiotensin receptor blockers, diuretics, digoxin, and antihyperlipidemics.   Congestive Heart Failure -Discuss the definition of CHF, how to live with CHF, the signs and symptoms of CHF, and how keep track of weight and sodium intake.   Heart Disease and Intimacy -Discus the effect sexual activity has on the heart, how changes occur during intimacy as we age, and safety during sexual activity.   Smoking Cessation / COPD -Discuss different methods to quit smoking, the health benefits of quitting smoking, and the definition of COPD.   Nutrition I: Fats -Discuss the types of cholesterol, what cholesterol does to the heart, and how cholesterol levels can be controlled.   Nutrition II: Labels -Discuss the different components of food labels and how to read food label   Heart Parts/Heart Disease and PAD -Discuss the anatomy of the heart, the pathway of blood circulation through the heart, and these are affected by heart disease.   Stress I: Signs and Symptoms -Discuss the causes of stress, how stress may lead to anxiety and depression, and ways to limit stress.   Stress II: Relaxation -Discuss different types of relaxation techniques to limit stress.   Warning Signs of Stroke / TIA -Discuss definition of a stroke, what the signs and symptoms are of a stroke, and how to identify when someone is having stroke.   Knowledge Questionnaire Score:  Knowledge Questionnaire Score - 02/23/20 1324      Knowledge Questionnaire Score   Pre Score 17/24           Core Components/Risk Factors/Patient Goals at Admission:  Personal Goals and Risk Factors at Admission -  02/23/20 1506      Core Components/Risk Factors/Patient Goals on Admission    Weight Management Yes;Obesity;Weight Loss    Intervention Weight Management: Develop a combined nutrition and exercise program designed to reach desired caloric intake, while maintaining appropriate intake of nutrient and fiber, sodium and fats, and appropriate energy expenditure required for the weight goal.;Weight Management: Provide education and appropriate resources to help participant work on and attain dietary goals.;Weight Management/Obesity: Establish reasonable short term and long term weight goals.;Obesity: Provide education and appropriate resources to help participant work on and attain dietary goals.    Admit Weight 295 lb 6.7 oz (134 kg)    Expected Outcomes Short  Term: Continue to assess and modify interventions until short term weight is achieved;Long Term: Adherence to nutrition and physical activity/exercise program aimed toward attainment of established weight goal;Weight Maintenance: Understanding of the daily nutrition guidelines, which includes 25-35% calories from fat, 7% or less cal from saturated fats, less than  cholesterol, less than 1.5gm of sodium, & 5 or more servings of fruits and vegetables daily;Weight Loss: Understanding of general recommendations for a balanced deficit meal plan, which promotes 1-2 lb weight loss per week and includes a negative energy balance of [phone] kcal/d;Understanding recommendations for meals to include 15-35% energy as protein, 25-35% energy from fat, 35-60% energy from carbohydrates, less than  of dietary cholesterol, 20-35 gm of total fiber daily;Understanding of distribution of calorie intake throughout the day with the consumption of 4-5 meals/snacks    Diabetes Yes    Intervention Provide education about proper nutrition, including hydration, and aerobic/resistive exercise prescription along with prescribed medications to achieve blood glucose in normal  ranges: Fasting glucose 65-99 mg/dL;Provide education about signs/symptoms and action to take for hypo/hyperglycemia.    Expected Outcomes Short Term: Participant verbalizes understanding of the signs/symptoms and immediate care of hyper/hypoglycemia, proper foot care and importance of medication, aerobic/resistive exercise and nutrition plan for blood glucose control.;Long Term: Attainment of HbA1C < 7%.    Heart Failure Yes    Intervention Provide a combined exercise and nutrition program that is supplemented with education, support and counseling about heart failure. Directed toward relieving symptoms such as shortness of breath, decreased exercise tolerance, and extremity edema.    Expected Outcomes Short term: Attendance in program 2-3 days a week with increased exercise capacity. Reported lower sodium intake. Reported increased fruit and vegetable intake. Reports medication compliance.;Short term: Daily weights obtained and reported for increase. Utilizing diuretic protocols set by physician.;Long term: Adoption of self-care skills and reduction of barriers for early signs and symptoms recognition and intervention leading to self-care maintenance.    Stress Yes    Intervention Offer individual and/or small group education and counseling on adjustment to heart disease, stress management and health-related lifestyle change. Teach and support self-help strategies.;Refer participants experiencing significant psychosocial distress to appropriate mental health specialists for further evaluation and treatment. When possible, include family members and significant others in education/counseling sessions.    Expected Outcomes Short Term: Participant demonstrates changes in health-related behavior, relaxation and other stress management skills, ability to obtain effective social support, and compliance with psychotropic medications if prescribed.;Long Term: Emotional wellbeing is indicated by absence of  clinically significant psychosocial distress or social isolation.           Core Components/Risk Factors/Patient Goals Review:    Core Components/Risk Factors/Patient Goals at Discharge (Final Review):    ITP Comments:  ITP Comments    Row Name 02/23/20 1215           ITP Comments Dr Armanda Magic MD, Medical Director              Comments: Elita Quick attended orientation on 02/23/2020 to review rules and guidelines for program.  Completed 6 minute walk test, Intitial ITP, and exercise prescription.  VSS. Telemetry-Sinus Rhythm.  Asymptomatic. Safety measures and social distancing in place per CDC guidelines.Gladstone Lighter, RN,BSN 02/23/2020 3:12 PM

## 2020-02-27 ENCOUNTER — Encounter (HOSPITAL_COMMUNITY)
Admission: RE | Admit: 2020-02-27 | Discharge: 2020-02-27 | Disposition: A | Discharge status: Home / Self Care | Payer: 59 | Source: Ambulatory Visit | Attending: Cardiology | Admitting: Cardiology

## 2020-02-27 ENCOUNTER — Other Ambulatory Visit: Payer: Self-pay

## 2020-02-27 DIAGNOSIS — I300 Acute nonspecific idiopathic pericarditis: Secondary | ICD-10-CM

## 2020-02-27 DIAGNOSIS — I319 Disease of pericardium, unspecified: Secondary | ICD-10-CM | POA: Diagnosis not present

## 2020-02-28 NOTE — Progress Notes (Signed)
Daily Session Note  Patient Details  Name: Alicia Wood MRN: [id number] Date of Birth: 22-Mar-1971 Referring Provider:     CARDIAC REHAB PHASE II ORIENTATION from 02/23/2020 in [place]  Referring Provider Dr. Minus Breeding, MD      Encounter Date: 02/27/2020  Check In:   Capillary Blood Glucose: No results found for this or any previous visit (from the past 24 hour(s)).   Exercise Prescription Changes - 02/27/20 1200      Response to Exercise   Blood Pressure (Admit) 118/80    Blood Pressure (Exercise) 128/78    Blood Pressure (Exit) 114/70    Heart Rate (Admit) 74 bpm    Heart Rate (Exercise) 112 bpm    Heart Rate (Exit) 75 bpm    Rating of Perceived Exertion (Exercise) 12    Perceived Dyspnea (Exercise) 0    Symptoms None    Comments Pt's first day of exerciuse in the CRP2 program    Duration Progress to 30 minutes of  aerobic without signs/symptoms of physical distress    Intensity THRR unchanged      Progression   Progression Continue to progress workloads to maintain intensity without signs/symptoms of physical distress.    Average METs 2.8      Resistance Training   Training Prescription Yes    Weight 3 lbs    Reps 10-15    Time 10 Minutes      Interval Training   Interval Training No      Treadmill   MPH 2.4    Grade 1    Minutes 15    METs 3.17      NuStep   Level 2    SPM 85    Minutes 15    METs 2.5           Social History   Tobacco Use  Smoking Status Never Smoker  Smokeless Tobacco Never Used    Goals Met:  Exercise tolerated well No report of cardiac concerns or symptoms Strength training completed today  Goals Unmet:  Not Applicable  Comments: Alicia Wood started cardiac rehab today.  Pt tolerated light exercise without difficulty. VSS, telemetry-Sinus Rhythm, asymptomatic.  Medication list reconciled. Pt denies barriers to medicaiton compliance.  PSYCHOSOCIAL ASSESSMENT:  PHQ-0. Pt  exhibits positive coping skills, hopeful outlook with supportive family. No psychosocial needs identified at this time, no psychosocial interventions necessary.    Pt enjoys watching movies.   Pt oriented to exercise equipment and routine.    Understanding verbalized.Alicia Pall, RN,BSN 02/28/2020 9:26 AM   Dr. Fransico Him is Medical Director for Cardiac Rehab at [facility].

## 2020-02-29 ENCOUNTER — Other Ambulatory Visit: Payer: Self-pay

## 2020-02-29 ENCOUNTER — Encounter (HOSPITAL_COMMUNITY)
Admission: RE | Admit: 2020-02-29 | Discharge: 2020-02-29 | Disposition: A | Discharge status: Home / Self Care | Payer: 59 | Source: Ambulatory Visit | Attending: Cardiology | Admitting: Cardiology

## 2020-02-29 DIAGNOSIS — I300 Acute nonspecific idiopathic pericarditis: Secondary | ICD-10-CM | POA: Diagnosis not present

## 2020-02-29 DIAGNOSIS — I319 Disease of pericardium, unspecified: Secondary | ICD-10-CM | POA: Diagnosis not present

## 2020-03-02 ENCOUNTER — Other Ambulatory Visit: Payer: Self-pay

## 2020-03-02 ENCOUNTER — Encounter (HOSPITAL_COMMUNITY)
Admission: RE | Admit: 2020-03-02 | Discharge: 2020-03-02 | Disposition: A | Discharge status: Home / Self Care | Payer: 59 | Source: Ambulatory Visit | Attending: Cardiology | Admitting: Cardiology

## 2020-03-02 VITALS — Wt 295.0 lb

## 2020-03-02 DIAGNOSIS — I319 Disease of pericardium, unspecified: Secondary | ICD-10-CM

## 2020-03-02 DIAGNOSIS — I300 Acute nonspecific idiopathic pericarditis: Secondary | ICD-10-CM

## 2020-03-02 NOTE — Progress Notes (Signed)
Alicia Wood- Alicia Wood 49 y.o. female Nutrition Note   Visit Diagnosis: Chronic pericarditis, unspecified complication status, unspecified type  Past Medical History:  Diagnosis Date  . Back pain   . Chest pain    a. Initially in setting of pericarditis in 11/2015; b. Ongoing cp despite prednisone, colchicine, ibuprofen,and narcotic therapy;  c. 12/2015 CTA Chest: No PE, stable small-mod pericardial effusion;  d. 12/2015 Cath: nl cors.  . Chest pain   . Chronic diastolic CHF (congestive heart failure) (HCC)    a. 11/2015 Echo: EF 55-60%, grade 2 diastolic dysfxn.  . Depression   . Edema of both lower extremities   . GERD (gastroesophageal reflux disease)   . Headache   . Hyperlipidemia   . Hypertension    Patient states she does not have hypertension.   . Osteoarthritis   . Pericardial effusion    a. 11/23/2015 Echo: mod pericardial effusion, no tamponade; b. 12/05/2015 Echo: small circumferential pericardial effusion; c. 12/06/2015 Cardiac MRI: pericardial thickening w/ circumferential late gad enhancement of pericardium and epicardium-->suspicious for subacute effusive constrictive pericarditis;  d. 12/19/2015 Echo: triv pericardial effusion;  e. 01/06/2016 CT Chest: small to mod pericard eff.  . Pericarditis    a. 11/2015 Cardiac MRI; EF 57%, mild MR, triv TR, mild pericardial effusion, thickening of pericardium focally up to 76mm w/ significant circumferential late gad enhancement of pericardium and epicardium sugg of subacute effusive constrictive pericarditis;  b. 12/2015 R Heart Cath: nl right heart pressures w/o evidence of constrictive physiology.  . Torn meniscus      Medications reviewed.   Current Outpatient Medications:  .  acetaminophen (TYLENOL) 650 MG CR tablet, Take 650 mg by mouth every 8 (eight) hours as needed for pain. Yesterday she took 1950 mg ( 3 tablets) ay 6 pm., Disp: , Rfl:  .  albuterol (VENTOLIN HFA) 108 (90 Base) MCG/ACT inhaler, Inhale 1 puff into the lungs  every 4 (four) hours as needed for wheezing or shortness of breath., Disp: , Rfl:    Ht Readings from Last 1 Encounters:  02/23/20 5' 7.5" (1.715 m)     Wt Readings from Last 3 Encounters:  02/23/20 295 lb 10.2 oz (134.1 kg)  01/19/20 287 lb (130.2 kg)  12/14/19 279 lb (126.6 kg)     There is no height or weight on file to calculate BMI.   Social History   Tobacco Use  Smoking Status Never Smoker  Smokeless Tobacco Never Used     No results found for: CHOL No results found for: HDL No results found for: LDLCALC No results found for: TRIG   Lab Results  Component Value Date   HGBA1C 6.2 (H) 06/08/2019     CBG (last 3)  No results for input(s): GLUCAP in the last 72 hours.   Nutrition Note  Spoke with pt. Nutrition Plan and Nutrition Survey goals reviewed with pt. Pt is following a Heart Healthy diet. Pt wants to lose wt.  Reviewed weight hx. She gained weight 6 years ago when her mother passed away.  Healthy weight and wellness March 2021-August 2021. She lost 20 lbs.  She was taking Contrave during this time.  After most recent heart event and steroids, she gained more than 20 lbs. She is off steroids.    Pt has Pre-diabetes. Last A1c indicates blood glucose well-controlled.  Vitamin D lab noted. 11.8 ng/ml.  Pt with dx of CHF. She reads labels and tries to limit sodium. She eats out most days and adds  salt occasionally.   Reviewed food preferences -   Allergies: lactose  Both pt and her husband cook. They have an airfryer, indoor grill, instant pot but she does not know how to use them yet.   She tries to avoid fried foods, she uses olive oil and butter alternative.  She wants to learn to meal plan.  Needs motivation as well as education.  She drinks mostly water. Occasionally a soda. Pt doing a 3 day food log to assess baseline energy intake.  Pt expressed understanding of the information reviewed.    Nutrition Diagnosis ? 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  Nutrition Intervention ? Pt's individual nutrition plan reviewed with pt ? Estimated energy needs for weight loss 1 lbs per week: 2,060 kcals/day ? Self monitoring with food log  ? Continue client-centered nutrition education by RD, as part of interdisciplinary care.  Goal(s) ? Pt to identify food quantities necessary to achieve weight loss of 6-24 lb at graduation from cardiac rehab.  ? Pt to build a healthy plate including vegetables, fruits, whole grains, and low-fat dairy products in a heart healthy meal plan. ? Pt to learn to meal prep  ? Pt to reduce eating out to <2 times per week  Plan:   Will provide client-centered nutrition education as part of interdisciplinary care  Monitor and evaluate progress toward nutrition goal with team.   Andrey Campanile, MS, RDN, LDN

## 2020-03-05 ENCOUNTER — Other Ambulatory Visit: Payer: Self-pay

## 2020-03-05 ENCOUNTER — Encounter (HOSPITAL_COMMUNITY)
Admission: RE | Admit: 2020-03-05 | Discharge: 2020-03-05 | Disposition: A | Discharge status: Home / Self Care | Payer: 59 | Source: Ambulatory Visit | Attending: Cardiology | Admitting: Cardiology

## 2020-03-05 DIAGNOSIS — I300 Acute nonspecific idiopathic pericarditis: Secondary | ICD-10-CM | POA: Diagnosis not present

## 2020-03-05 DIAGNOSIS — I319 Disease of pericardium, unspecified: Secondary | ICD-10-CM

## 2020-03-05 NOTE — Progress Notes (Signed)
Nutrition Note  Reviewed food log. Pt eating out for most meals. Appetite is not great.  Provided pt with nutrition prescription. Goals updated:  Choose banana and nuts for snacks at work.  Choose a salad every other day. Spend time weekly preparing food for work.  Andrey Campanile, MS, RDN, LDN

## 2020-03-06 NOTE — Progress Notes (Signed)
Cardiac Individual Treatment Plan  Patient Details  Name: Alicia Wood MRN: [id number] Date of Birth: April 22, 1970 Referring Provider:   Flowsheet Row CARDIAC REHAB PHASE II ORIENTATION from 02/23/2020 in [facility] CARDIAC REHAB  Referring Provider Dr. Rollene Rotunda, MD      Initial Encounter Date:  Flowsheet Row CARDIAC REHAB PHASE II ORIENTATION from 02/23/2020 in [facility] CARDIAC REHAB  Date 02/23/20      Visit Diagnosis: Chronic pericarditis, unspecified complication status, unspecified type  Acute idiopathic pericarditis  Patient's Home Medications on Admission:  Current Outpatient Medications:  .  acetaminophen (TYLENOL) 650 MG CR tablet, Take 650 mg by mouth every 8 (eight) hours as needed for pain. Yesterday she took 1950 mg ( 3 tablets) ay 6 pm., Disp: , Rfl:  .  albuterol (VENTOLIN HFA) 108 (90 Base) MCG/ACT inhaler, Inhale 1 puff into the lungs every 4 (four) hours as needed for wheezing or shortness of breath., Disp: , Rfl:   Past Medical History: Past Medical History:  Diagnosis Date  . Back pain   . Chest pain    a. Initially in setting of pericarditis in 11/2015; b. Ongoing cp despite prednisone, colchicine, ibuprofen,and narcotic therapy;  c. 12/2015 CTA Chest: No PE, stable small-mod pericardial effusion;  d. 12/2015 Cath: nl cors.  . Chest pain   . Chronic diastolic CHF (congestive heart failure) (HCC)    a. 11/2015 Echo: EF 55-60%, grade 2 diastolic dysfxn.  . Depression   . Edema of both lower extremities   . GERD (gastroesophageal reflux disease)   . Headache   . Hyperlipidemia   . Hypertension    Patient states she does not have hypertension.   . Osteoarthritis   . Pericardial effusion    a. 11/23/2015 Echo: mod pericardial effusion, no tamponade; b. 12/05/2015 Echo: small circumferential pericardial effusion; c. 12/06/2015 Cardiac MRI: pericardial thickening w/ circumferential late gad enhancement of  pericardium and epicardium-->suspicious for subacute effusive constrictive pericarditis;  d. 12/19/2015 Echo: triv pericardial effusion;  e. 01/06/2016 CT Chest: small to mod pericard eff.  . Pericarditis    a. 11/2015 Cardiac MRI; EF 57%, mild MR, triv TR, mild pericardial effusion, thickening of pericardium focally up to 8mm w/ significant circumferential late gad enhancement of pericardium and epicardium sugg of subacute effusive constrictive pericarditis;  b. 12/2015 R Heart Cath: nl right heart pressures w/o evidence of constrictive physiology.  . Torn meniscus     Tobacco Use: Social History   Tobacco Use  Smoking Status Never Smoker  Smokeless Tobacco Never Used    Labs: Recent Review Flowsheet Data    Labs for ITP Cardiac and Pulmonary Rehab Latest Ref Rng & Units 01/21/2016 01/21/2016 06/08/2019   Hemoglobin A1c 4.8 - 5.6 % - - 6.2(H)   PHART 7.350 - 7.450 7.373 - -   PCO2ART 32.0 - 48.0 mmHg 54.7(H) - -   HCO3 20.0 - 28.0 mmol/L 31.9(H) 30.7(H) -   TCO2 0 - 100 mmol/L 33 32 -   O2SAT % 94.0 74.0 -      Capillary Blood Glucose: Lab Results  Component Value Date   GLUCAP 92 01/21/2016   GLUCAP 99 12/10/2015   GLUCAP 110 (H) 12/10/2015   GLUCAP 138 (H) 12/09/2015   GLUCAP 112 (H) 12/09/2015     Exercise Target Goals: Exercise Program Goal: Individual exercise prescription set using results from initial 6 min walk test and THRR while considering  patient's activity barriers and safety.   Exercise  Prescription Goal: Starting with aerobic activity 30 plus minutes a day, 3 days per week for initial exercise prescription. Provide home exercise prescription and guidelines that participant acknowledges understanding prior to discharge.  Activity Barriers & Risk Stratification:  Activity Barriers & Cardiac Risk Stratification - 02/23/20 1329      Activity Barriers & Cardiac Risk Stratification   Activity Barriers Joint Problems;History of Falls    Cardiac Risk  Stratification Moderate           6 Minute Walk:  6 Minute Walk    Row Name 02/23/20 1038         6 Minute Walk   Phase Initial     Distance 1400 feet     Walk Time 6 minutes     # of Rest Breaks 0     MPH 2.7     METS 3.1     RPE 11     Perceived Dyspnea  0     VO2 Peak 11     Symptoms No     Resting HR 67 bpm     Resting BP 124/60     Resting Oxygen Saturation  99 %     Exercise Oxygen Saturation  during 6 min walk 99 %     Max Ex. HR 120 bpm     Max Ex. BP 118/70     2 Minute Post BP 112/70            Oxygen Initial Assessment:   Oxygen Re-Evaluation:   Oxygen Discharge (Final Oxygen Re-Evaluation):   Initial Exercise Prescription:  Initial Exercise Prescription - 02/23/20 1300      Date of Initial Exercise RX and Referring Provider   Date 02/23/20    Referring Provider Dr. Rollene Rotunda, MD    Expected Discharge Date 04/20/20      Treadmill   MPH 2.4    Grade 1    Minutes 15    METs 3.17      NuStep   Level 2    SPM 85    Minutes 15    METs 2      Prescription Details   Frequency (times per week) 3    Duration Progress to 30 minutes of continuous aerobic without signs/symptoms of physical distress      Intensity   THRR 40-80% of Max Heartrate 68-135    Ratings of Perceived Exertion 11-13    Perceived Dyspnea 0-4      Progression   Progression Continue progressive overload as per policy without signs/symptoms or physical distress.      Resistance Training   Training Prescription Yes    Weight 3 lbs    Reps 10-15           Perform Capillary Blood Glucose checks as needed.  Exercise Prescription Changes:   Exercise Prescription Changes    Row Name 02/27/20 1200             Response to Exercise   Blood Pressure (Admit) 118/80       Blood Pressure (Exercise) 128/78       Blood Pressure (Exit) 114/70       Heart Rate (Admit) 74 bpm       Heart Rate (Exercise) 112 bpm       Heart Rate (Exit) 75 bpm       Rating of  Perceived Exertion (Exercise) 12       Perceived Dyspnea (Exercise) 0       Symptoms  None       Comments Pt's first day of exerciuse in the CRP2 program       Duration Progress to 30 minutes of  aerobic without signs/symptoms of physical distress       Intensity THRR unchanged               Progression   Progression Continue to progress workloads to maintain intensity without signs/symptoms of physical distress.       Average METs 2.8               Resistance Training   Training Prescription Yes       Weight 3 lbs       Reps 10-15       Time 10 Minutes               Interval Training   Interval Training No               Treadmill   MPH 2.4       Grade 1       Minutes 15       METs 3.17               NuStep   Level 2       SPM 85       Minutes 15       METs 2.5              Exercise Comments:   Exercise Comments    Row Name 02/27/20 1256           Exercise Comments Pt's first day of exercise in the CRP2 program. Pt tolerated the session well with no complaints.              Exercise Goals and Review:   Exercise Goals    Row Name 02/23/20 1420             Exercise Goals   Increase Physical Activity Yes       Intervention Provide advice, education, support and counseling about physical activity/exercise needs.;Develop an individualized exercise prescription for aerobic and resistive training based on initial evaluation findings, risk stratification, comorbidities and participant's personal goals.       Expected Outcomes Short Term: Attend rehab on a regular basis to increase amount of physical activity.;Long Term: Add in home exercise to make exercise part of routine and to increase amount of physical activity.;Long Term: Exercising regularly at least 3-5 days a week.       Increase Strength and Stamina Yes       Intervention Provide advice, education, support and counseling about physical activity/exercise needs.;Develop an individualized exercise  prescription for aerobic and resistive training based on initial evaluation findings, risk stratification, comorbidities and participant's personal goals.       Expected Outcomes Short Term: Increase workloads from initial exercise prescription for resistance, speed, and METs.;Short Term: Perform resistance training exercises routinely during rehab and add in resistance training at home;Long Term: Improve cardiorespiratory fitness, muscular endurance and strength as measured by increased METs and functional capacity ( )       Able to understand and use rate of perceived exertion (RPE) scale Yes       Intervention Provide education and explanation on how to use RPE scale       Expected Outcomes Short Term: Able to use RPE daily in rehab to express subjective intensity level;Long Term:  Able to use RPE to guide intensity level when exercising  independently       Knowledge and understanding of Target Heart Rate Range (THRR) Yes       Intervention Provide education and explanation of THRR including how the numbers were predicted and where they are located for reference       Expected Outcomes Short Term: Able to state/look up THRR;Long Term: Able to use THRR to govern intensity when exercising independently;Short Term: Able to use daily as guideline for intensity in rehab       Understanding of Exercise Prescription Yes       Intervention Provide education, explanation, and written materials on patient's individual exercise prescription       Expected Outcomes Short Term: Able to explain program exercise prescription;Long Term: Able to explain home exercise prescription to exercise independently              Exercise Goals Re-Evaluation :  Exercise Goals Re-Evaluation    Row Name 02/27/20 1254             Exercise Goal Re-Evaluation   Exercise Goals Review Increase Physical Activity;Increase Strength and Stamina;Able to understand and use rate of perceived exertion (RPE) scale;Knowledge and  understanding of Target Heart Rate Range (THRR);Understanding of Exercise Prescription       Comments Pt's first day of exercise in the CRP2 program. Pt tolerated  the session well and understands the exercise Rx, THRR, and RPE scale.       Expected Outcomes Will continue to monitor patient and progress exercise workloads as tolerated.               Discharge Exercise Prescription (Final Exercise Prescription Changes):  Exercise Prescription Changes - 02/27/20 1200      Response to Exercise   Blood Pressure (Admit) 118/80    Blood Pressure (Exercise) 128/78    Blood Pressure (Exit) 114/70    Heart Rate (Admit) 74 bpm    Heart Rate (Exercise) 112 bpm    Heart Rate (Exit) 75 bpm    Rating of Perceived Exertion (Exercise) 12    Perceived Dyspnea (Exercise) 0    Symptoms None    Comments Pt's first day of exerciuse in the CRP2 program    Duration Progress to 30 minutes of  aerobic without signs/symptoms of physical distress    Intensity THRR unchanged      Progression   Progression Continue to progress workloads to maintain intensity without signs/symptoms of physical distress.    Average METs 2.8      Resistance Training   Training Prescription Yes    Weight 3 lbs    Reps 10-15    Time 10 Minutes      Interval Training   Interval Training No      Treadmill   MPH 2.4    Grade 1    Minutes 15    METs 3.17      NuStep   Level 2    SPM 85    Minutes 15    METs 2.5           Nutrition:  Target Goals: Understanding of nutrition guidelines, daily intake of sodium 1500mg , cholesterol 200mg , calories 30% from fat and 7% or less from saturated fats, daily to have 5 or more servings of fruits and vegetables.  Biometrics:  Pre Biometrics - 02/23/20 0930      Pre Biometrics   Waist Circumference 54 inches    Hip Circumference 59.25 inches    Waist to Hip Ratio 0.91 %  Triceps Skinfold 55 mm    % Body Fat 57.1 %    Grip Strength 44 kg    Flexibility 11.75 in     Single Leg Stand 9.31 seconds   Moderate fall risk           Nutrition Therapy Plan and Nutrition Goals:  Nutrition Therapy & Goals - 03/02/20 1205      Nutrition Therapy   Diet TLC; weight loss      Personal Nutrition Goals   Nutrition Goal Pt to identify food quantities necessary to achieve weight loss of 6-24 lb at graduation from cardiac rehab.    Personal Goal #2 Pt to build a healthy plate including vegetables, fruits, whole grains, and low-fat dairy products in a heart healthy meal plan.    Personal Goal #3 Pt to learn to meal prep    Personal Goal #4 Pt to reduce eating out to <2 times per week      Intervention Plan   Intervention Prescribe, educate and counsel regarding individualized specific dietary modifications aiming towards targeted core components such as weight, hypertension, lipid management, diabetes, heart failure and other comorbidities.    Expected Outcomes Short Term Goal: A plan has been developed with personal nutrition goals set during dietitian appointment.;Long Term Goal: Adherence to prescribed nutrition plan.           Nutrition Assessments:  MEDIFICTS Score Key:  ?70 Need to make dietary changes   40-70 Heart Healthy Diet  ? 40 Therapeutic Level Cholesterol Diet   Picture Your Plate Scores:  <82 Unhealthy dietary pattern with much room for improvement.  41-50 Dietary pattern unlikely to meet recommendations for good health and room for improvement.  51-60 More healthful dietary pattern, with some room for improvement.   >60 Healthy dietary pattern, although there may be some specific behaviors that could be improved.    Nutrition Goals Re-Evaluation:  Nutrition Goals Re-Evaluation    Row Name 03/02/20 1206             Goals   Current Weight 295 lb (133.8 kg)       Nutrition Goal Pt to identify food quantities necessary to achieve weight loss of 6-24 lb at graduation from cardiac rehab.               Personal Goal #2  Re-Evaluation   Personal Goal #2 Pt to build a healthy plate including vegetables, fruits, whole grains, and low-fat dairy products in a heart healthy meal plan.               Personal Goal #3 Re-Evaluation   Personal Goal #3 Pt to learn to meal prep               Personal Goal #4 Re-Evaluation   Personal Goal #4 Pt to reduce eating out to <2 times per week              Nutrition Goals Discharge (Final Nutrition Goals Re-Evaluation):  Nutrition Goals Re-Evaluation - 03/02/20 1206      Goals   Current Weight 295 lb (133.8 kg)    Nutrition Goal Pt to identify food quantities necessary to achieve weight loss of 6-24 lb at graduation from cardiac rehab.      Personal Goal #2 Re-Evaluation   Personal Goal #2 Pt to build a healthy plate including vegetables, fruits, whole grains, and low-fat dairy products in a heart healthy meal plan.      Personal Goal #3 Re-Evaluation  Personal Goal #3 Pt to learn to meal prep      Personal Goal #4 Re-Evaluation   Personal Goal #4 Pt to reduce eating out to <2 times per week           Psychosocial: Target Goals: Acknowledge presence or absence of significant depression and/or stress, maximize coping skills, provide positive support system. Participant is able to verbalize types and ability to use techniques and skills needed for reducing stress and depression.  Initial Review & Psychosocial Screening:  Initial Psych Review & Screening - 02/23/20 1501      Initial Review   Current issues with History of Depression;Current Stress Concerns    Source of Stress Concerns Occupation;Chronic Illness    Comments Alicia Wood has health concerns regarding having pericarditis and works two jobs at night.      Family Dynamics   Good Support System? Yes   Alicia Wood recently got married and has her husband and children for support     Barriers   Psychosocial barriers to participate in program The patient should benefit from training in stress management and  relaxation.      Screening Interventions   Interventions Encouraged to exercise    Expected Outcomes Long Term Goal: Stressors or current issues are controlled or eliminated.;Short Term goal: Identification and review with participant of any Quality of Life or Depression concerns found by scoring the questionnaire.;Long Term goal: The participant improves quality of Life and PHQ9 Scores as seen by post scores and/or verbalization of changes           Quality of Life Scores:  Quality of Life - 02/23/20 1322      Quality of Life   Select Quality of Life      Quality of Life Scores   Health/Function Pre 21.03 %    Socioeconomic Pre 22.29 %    Psych/Spiritual Pre 22.29 %    Family Pre 27.6 %    GLOBAL Pre 22.51 %          Scores of 19 and below usually indicate a poorer quality of life in these areas.  A difference of  2-3 points is a clinically meaningful difference.  A difference of 2-3 points in the total score of the Quality of Life Index has been associated with significant improvement in overall quality of life, self-image, physical symptoms, and general health in studies assessing change in quality of life.  PHQ-9: Recent Review Flowsheet Data    Depression screen [facility] 2/9 02/23/2020 02/23/2020 06/08/2019 01/28/2016 01/16/2016   Decreased Interest 0 0 2 0 2   Down, Depressed, Hopeless 0 0 1 0 2   PHQ - 2 Score 0 0 3 0 4   Altered sleeping - - 2 - 3   Tired, decreased energy - - 1 - 2   Change in appetite - - 2 - 2   Feeling bad or failure about yourself  - - 0 - 0   Trouble concentrating - - 0 - 2   Moving slowly or fidgety/restless - - 0 - 0   Suicidal thoughts - - 0 - 0   PHQ-9 Score - - 8 - 13   Difficult doing work/chores - - Not difficult at all - Not difficult at all     Interpretation of Total Score  Total Score Depression Severity:  1-4 = Minimal depression, 5-9 = Mild depression, 10-14 = Moderate depression, 15-19 = Moderately severe depression, 20-27 = Severe  depression   Psychosocial Evaluation  and Intervention:   Psychosocial Re-Evaluation:  Psychosocial Re-Evaluation    Row Name 03/06/20 1126             Psychosocial Re-Evaluation   Current issues with History of Depression;Current Sleep Concerns       Comments Will continue to monitor and offer support as needed       Expected Outcomes Alicia Wood will have less stress upon completion of phase 2 cardiac rehab.       Continue Psychosocial Services  No Follow up required              Psychosocial Discharge (Final Psychosocial Re-Evaluation):  Psychosocial Re-Evaluation - 03/06/20 1126      Psychosocial Re-Evaluation   Current issues with History of Depression;Current Sleep Concerns    Comments Will continue to monitor and offer support as needed    Expected Outcomes Alicia Wood will have less stress upon completion of phase 2 cardiac rehab.    Continue Psychosocial Services  No Follow up required           Vocational Rehabilitation: Provide vocational rehab assistance to qualifying candidates.   Vocational Rehab Evaluation & Intervention:  Vocational Rehab - 02/23/20 1216      Initial Vocational Rehab Evaluation & Intervention   Assessment shows need for Vocational Rehabilitation No      Vocational Rehab Re-Evaulation   Comments Alicia Wood is currently working full time and does not need vocational rehab at this time           Education: Education Goals: Education classes will be provided on a weekly basis, covering required topics. Participant will state understanding/return demonstration of topics presented.  Learning Barriers/Preferences:  Learning Barriers/Preferences - 02/23/20 1323      Learning Barriers/Preferences   Learning Barriers None    Learning Preferences None           Education Topics: Hypertension, Hypertension Reduction -Define heart disease and high blood pressure. Discus how high blood pressure affects the body and ways to reduce high blood  pressure.   Exercise and Your Heart -Discuss why it is important to exercise, the FITT principles of exercise, normal and abnormal responses to exercise, and how to exercise safely.   Angina -Discuss definition of angina, causes of angina, treatment of angina, and how to decrease risk of having angina.   Cardiac Medications -Review what the following cardiac medications are used for, how they affect the body, and side effects that may occur when taking the medications.  Medications include Aspirin, Beta blockers, calcium channel blockers, ACE Inhibitors, angiotensin receptor blockers, diuretics, digoxin, and antihyperlipidemics.   Congestive Heart Failure -Discuss the definition of CHF, how to live with CHF, the signs and symptoms of CHF, and how keep track of weight and sodium intake.   Heart Disease and Intimacy -Discus the effect sexual activity has on the heart, how changes occur during intimacy as we age, and safety during sexual activity.   Smoking Cessation / COPD -Discuss different methods to quit smoking, the health benefits of quitting smoking, and the definition of COPD.   Nutrition I: Fats -Discuss the types of cholesterol, what cholesterol does to the heart, and how cholesterol levels can be controlled.   Nutrition II: Labels -Discuss the different components of food labels and how to read food label   Heart Parts/Heart Disease and PAD -Discuss the anatomy of the heart, the pathway of blood circulation through the heart, and these are affected by heart disease.   Stress I: Signs and  Symptoms -Discuss the causes of stress, how stress may lead to anxiety and depression, and ways to limit stress.   Stress II: Relaxation -Discuss different types of relaxation techniques to limit stress.   Warning Signs of Stroke / TIA -Discuss definition of a stroke, what the signs and symptoms are of a stroke, and how to identify when someone is having stroke.   Knowledge  Questionnaire Score:  Knowledge Questionnaire Score - 02/23/20 1324      Knowledge Questionnaire Score   Pre Score 17/24           Core Components/Risk Factors/Patient Goals at Admission:  Personal Goals and Risk Factors at Admission - 02/23/20 1506      Core Components/Risk Factors/Patient Goals on Admission    Weight Management Yes;Obesity;Weight Loss    Intervention Weight Management: Develop a combined nutrition and exercise program designed to reach desired caloric intake, while maintaining appropriate intake of nutrient and fiber, sodium and fats, and appropriate energy expenditure required for the weight goal.;Weight Management: Provide education and appropriate resources to help participant work on and attain dietary goals.;Weight Management/Obesity: Establish reasonable short term and long term weight goals.;Obesity: Provide education and appropriate resources to help participant work on and attain dietary goals.    Admit Weight 295 lb 6.7 oz (134 kg)    Expected Outcomes Short Term: Continue to assess and modify interventions until short term weight is achieved;Long Term: Adherence to nutrition and physical activity/exercise program aimed toward attainment of established weight goal;Weight Maintenance: Understanding of the daily nutrition guidelines, which includes 25-35% calories from fat, 7% or less cal from saturated fats, less than 200mg  cholesterol, less than 1.5gm of sodium, & 5 or more servings of fruits and vegetables daily;Weight Loss: Understanding of general recommendations for a balanced deficit meal plan, which promotes 1-2 lb weight loss per week and includes a negative energy balance of [phone] kcal/d;Understanding recommendations for meals to include 15-35% energy as protein, 25-35% energy from fat, 35-60% energy from carbohydrates, less than 200mg  of dietary cholesterol, 20-35 gm of total fiber daily;Understanding of distribution of calorie intake throughout the day with  the consumption of 4-5 meals/snacks    Diabetes Yes    Intervention Provide education about proper nutrition, including hydration, and aerobic/resistive exercise prescription along with prescribed medications to achieve blood glucose in normal ranges: Fasting glucose 65-99 mg/dL;Provide education about signs/symptoms and action to take for hypo/hyperglycemia.    Expected Outcomes Short Term: Participant verbalizes understanding of the signs/symptoms and immediate care of hyper/hypoglycemia, proper foot care and importance of medication, aerobic/resistive exercise and nutrition plan for blood glucose control.;Long Term: Attainment of HbA1C < 7%.    Heart Failure Yes    Intervention Provide a combined exercise and nutrition program that is supplemented with education, support and counseling about heart failure. Directed toward relieving symptoms such as shortness of breath, decreased exercise tolerance, and extremity edema.    Expected Outcomes Short term: Attendance in program 2-3 days a week with increased exercise capacity. Reported lower sodium intake. Reported increased fruit and vegetable intake. Reports medication compliance.;Short term: Daily weights obtained and reported for increase. Utilizing diuretic protocols set by physician.;Long term: Adoption of self-care skills and reduction of barriers for early signs and symptoms recognition and intervention leading to self-care maintenance.    Stress Yes    Intervention Offer individual and/or small group education and counseling on adjustment to heart disease, stress management and health-related lifestyle change. Teach and support self-help strategies.;Refer participants experiencing significant psychosocial distress  to appropriate mental health specialists for further evaluation and treatment. When possible, include family members and significant others in education/counseling sessions.    Expected Outcomes Short Term: Participant demonstrates changes  in health-related behavior, relaxation and other stress management skills, ability to obtain effective social support, and compliance with psychotropic medications if prescribed.;Long Term: Emotional wellbeing is indicated by absence of clinically significant psychosocial distress or social isolation.           Core Components/Risk Factors/Patient Goals Review:   Goals and Risk Factor Review    Row Name 02/28/20 1012 02/28/20 1013 03/06/20 1128         Core Components/Risk Factors/Patient Goals Review   Personal Goals Review Weight Management/Obesity;Heart Failure;Stress Weight Management/Obesity;Heart Failure;Stress;Diabetes Weight Management/Obesity;Heart Failure;Stress;Diabetes     Review -- Alicia Wood did well on her first day of exercise. Alicia Wood's vital signs were stable. Alicia Wood is doing well with exercise at cardiac rehab. Alicia Wood's vital signs have been stable     Expected Outcomes -- Alicia Wood will continue to participate in phase 2 cardiac rehab for exercise, nutrition and lifestyle modifications Alicia Wood will continue to participate in phase 2 cardiac rehab for exercise, nutrition and lifestyle modifications            Core Components/Risk Factors/Patient Goals at Discharge (Final Review):   Goals and Risk Factor Review - 03/06/20 1128      Core Components/Risk Factors/Patient Goals Review   Personal Goals Review Weight Management/Obesity;Heart Failure;Stress;Diabetes    Review Alicia Wood is doing well with exercise at cardiac rehab. Alicia Wood's vital signs have been stable    Expected Outcomes Alicia Wood will continue to participate in phase 2 cardiac rehab for exercise, nutrition and lifestyle modifications           ITP Comments:  ITP Comments    Row Name 02/23/20 1215 03/06/20 1125         ITP Comments Dr Armanda Magic MD, Medical Director 30 ITP Review. Alicia Wood is off to a good start to exercise at cardiac rehab             Comments: See ITP comments.Gladstone Lighter, RN,BSN 03/06/2020 11:48 AM

## 2020-03-07 ENCOUNTER — Other Ambulatory Visit: Payer: Self-pay

## 2020-03-07 ENCOUNTER — Encounter (HOSPITAL_COMMUNITY)
Admission: RE | Admit: 2020-03-07 | Discharge: 2020-03-07 | Disposition: A | Discharge status: Home / Self Care | Payer: 59 | Source: Ambulatory Visit | Attending: Cardiology | Admitting: Cardiology

## 2020-03-07 DIAGNOSIS — I319 Disease of pericardium, unspecified: Secondary | ICD-10-CM

## 2020-03-07 DIAGNOSIS — I300 Acute nonspecific idiopathic pericarditis: Secondary | ICD-10-CM

## 2020-03-09 ENCOUNTER — Other Ambulatory Visit: Payer: Self-pay

## 2020-03-09 ENCOUNTER — Encounter (HOSPITAL_COMMUNITY)
Admission: RE | Admit: 2020-03-09 | Discharge: 2020-03-09 | Disposition: A | Discharge status: Home / Self Care | Payer: 59 | Source: Ambulatory Visit | Attending: Cardiology | Admitting: Cardiology

## 2020-03-09 DIAGNOSIS — I319 Disease of pericardium, unspecified: Secondary | ICD-10-CM | POA: Diagnosis not present

## 2020-03-09 DIAGNOSIS — I300 Acute nonspecific idiopathic pericarditis: Secondary | ICD-10-CM

## 2020-03-12 ENCOUNTER — Other Ambulatory Visit: Payer: Self-pay

## 2020-03-12 ENCOUNTER — Encounter (HOSPITAL_COMMUNITY)
Admission: RE | Admit: 2020-03-12 | Discharge: 2020-03-12 | Disposition: A | Discharge status: Home / Self Care | Payer: 59 | Source: Ambulatory Visit | Attending: Cardiology | Admitting: Cardiology

## 2020-03-12 DIAGNOSIS — I300 Acute nonspecific idiopathic pericarditis: Secondary | ICD-10-CM | POA: Diagnosis not present

## 2020-03-12 DIAGNOSIS — I319 Disease of pericardium, unspecified: Secondary | ICD-10-CM

## 2020-03-12 NOTE — Progress Notes (Signed)
Nutrition Note Follow Up  Pt taking daily vitamin D supplement. Self monitoring: keeping food log  Goal: construct a meal plan  Reviewed progress: no fast food and no fried food in past week. Pt feels motivated to continue working on weight loss goals.  Will continue to monitor pt during cardiac rehab.  Andrey Campanile, MS, RDN, LDN

## 2020-03-14 ENCOUNTER — Encounter (HOSPITAL_COMMUNITY)
Admission: RE | Admit: 2020-03-14 | Discharge: 2020-03-14 | Disposition: A | Discharge status: Home / Self Care | Payer: 59 | Source: Ambulatory Visit | Attending: Cardiology | Admitting: Cardiology

## 2020-03-14 ENCOUNTER — Other Ambulatory Visit: Payer: Self-pay

## 2020-03-14 DIAGNOSIS — I319 Disease of pericardium, unspecified: Secondary | ICD-10-CM

## 2020-03-14 DIAGNOSIS — I300 Acute nonspecific idiopathic pericarditis: Secondary | ICD-10-CM | POA: Diagnosis not present

## 2020-03-19 ENCOUNTER — Encounter (HOSPITAL_COMMUNITY): Payer: 59

## 2020-03-21 ENCOUNTER — Encounter (HOSPITAL_COMMUNITY)
Admission: RE | Admit: 2020-03-21 | Discharge: 2020-03-21 | Disposition: A | Discharge status: Home / Self Care | Payer: 59 | Source: Ambulatory Visit | Attending: Cardiology | Admitting: Cardiology

## 2020-03-21 ENCOUNTER — Other Ambulatory Visit: Payer: Self-pay

## 2020-03-21 DIAGNOSIS — I319 Disease of pericardium, unspecified: Secondary | ICD-10-CM | POA: Diagnosis not present

## 2020-03-21 DIAGNOSIS — I300 Acute nonspecific idiopathic pericarditis: Secondary | ICD-10-CM | POA: Diagnosis not present

## 2020-03-26 ENCOUNTER — Telehealth (HOSPITAL_COMMUNITY): Payer: Self-pay | Admitting: *Deleted

## 2020-03-26 ENCOUNTER — Encounter (HOSPITAL_COMMUNITY): Payer: 59

## 2020-03-26 DIAGNOSIS — R030 Elevated blood-pressure reading, without diagnosis of hypertension: Secondary | ICD-10-CM | POA: Diagnosis not present

## 2020-03-26 DIAGNOSIS — M79605 Pain in left leg: Secondary | ICD-10-CM | POA: Diagnosis not present

## 2020-03-26 NOTE — Telephone Encounter (Signed)
Received call from Sheresa asking whether she should come to exercise at Cardiac Rehab today.  Belvie states she has had pain in her leg with swelling since Friday.  Leg feels warm but pt says she is warm all over due to being in  Unable to bear weight on her leg due to the pain.  Pt traveled to [place] by car on New Years Day but endorses she had the pain prior to this.  Asked what makes it better - indicated that nothing really helps. Pt wonders if she has a blood clot.  Pt does not take any blood thinners and was recently taken off aspirin. Advised pt that she should contact her PCP for an urgent appt today.  Pt husband is currently at work but gets off at 11.  If unable to be seen today, seek care at Urgent care or Emergency room for ultrasound for possible blood clot.  Asked that she keep Cardiac rehab informed.  Verbalized understanding.  Alanson Aly, BSN Cardiac and Emergency planning/management officer

## 2020-03-28 ENCOUNTER — Encounter (HOSPITAL_COMMUNITY)
Admission: RE | Admit: 2020-03-28 | Discharge: 2020-03-28 | Disposition: A | Discharge status: Home / Self Care | Payer: 59 | Source: Ambulatory Visit | Attending: Cardiology | Admitting: Cardiology

## 2020-03-28 ENCOUNTER — Other Ambulatory Visit: Payer: Self-pay

## 2020-03-28 DIAGNOSIS — I319 Disease of pericardium, unspecified: Secondary | ICD-10-CM | POA: Diagnosis not present

## 2020-03-28 NOTE — Progress Notes (Signed)
Nutrition Note  Spoke with pt. Worked on meal planning and goal setting for the upcoming week. Plans to grocery shop and create mini meals to eat because she often is not very hungry. Reviewed nutrient dense foods, antioxidants, and inflammation  Handout: Johny Drilling anti inflammatory foods She continues to keep food log at home.   Will Continue to monitor pt during cardiac rehab.  Andrey Campanile, MS, RDN, LDN

## 2020-03-29 ENCOUNTER — Other Ambulatory Visit (HOSPITAL_COMMUNITY): Admission: RE | Admit: 2020-03-29 | Payer: 59 | Source: Ambulatory Visit

## 2020-03-29 NOTE — Progress Notes (Signed)
Cardiology Office Note   Date:  03/30/2020   ID:  Alicia Wood, DOB 1971-01-04, MRN [id number]  PCP:  Shon Hale, MD  Cardiologist:   Rollene Rotunda, MD Referring:  Shon Hale, MD    Chief Complaint  Patient presents with  . Chest Pain      History of Present Illness: Alicia Wood is a 50 y.o. female who was referred by Shon Hale, MD for evaluation of chest pain.   She was in the ED in late Sept for this.  Trop was negative and there were no acute changes on echo.   She did have pericarditis in 2017.  I saw her last over 3 years ago.   I saw her for this in October.  Her sed rate was only mildly elevated.  She was treated with steroids and colchicine.   There was trace pericardial effusion.     Since then she does get occasional chest discomfort.  Tries to take some Tylenol.  It is similar to her previous angina.  This comes sporadically.  She does not have the same level of discomfort that she had previously.  She is not having any new shortness of breath, PND or orthopnea.  She is third shift at [employer].  She realizes that some activities seem to exacerbate her discomfort and she cannot do them.   Past Medical History:  Diagnosis Date  . Back pain   . Chest pain    a. Initially in setting of pericarditis in 11/2015; b. Ongoing cp despite prednisone, colchicine, ibuprofen,and narcotic therapy;  c. 12/2015 CTA Chest: No PE, stable small-mod pericardial effusion;  d. 12/2015 Cath: nl cors.  . Chest pain   . Chronic diastolic CHF (congestive heart failure) (HCC)    a. 11/2015 Echo: EF 55-60%, grade 2 diastolic dysfxn.  . Depression   . Edema of both lower extremities   . GERD (gastroesophageal reflux disease)   . Headache   . Hyperlipidemia   . Hypertension    Patient states she does not have hypertension.   . Osteoarthritis   . Pericardial effusion    a. 11/23/2015 Echo: mod pericardial effusion, no tamponade; b. 12/05/2015 Echo: small  circumferential pericardial effusion; c. 12/06/2015 Cardiac MRI: pericardial thickening w/ circumferential late gad enhancement of pericardium and epicardium-->suspicious for subacute effusive constrictive pericarditis;  d. 12/19/2015 Echo: triv pericardial effusion;  e. 01/06/2016 CT Chest: small to mod pericard eff.  . Pericarditis    a. 11/2015 Cardiac MRI; EF 57%, mild MR, triv TR, mild pericardial effusion, thickening of pericardium focally up to 55mm w/ significant circumferential late gad enhancement of pericardium and epicardium sugg of subacute effusive constrictive pericarditis;  b. 12/2015 R Heart Cath: nl right heart pressures w/o evidence of constrictive physiology.  . Torn meniscus     Past Surgical History:  Procedure Laterality Date  . ABDOMINAL HYSTERECTOMY    . APPENDECTOMY    . CARDIAC CATHETERIZATION N/A 01/21/2016   Procedure: Right/Left Heart Cath and Coronary Angiography;  Surgeon: Kathleene Hazel, MD;  Location: [facility] INVASIVE CV LAB;  Service: Cardiovascular;  Laterality: N/A;  . ESOPHAGOGASTRODUODENOSCOPY (EGD) WITH PROPOFOL N/A 01/30/2016   Procedure: ESOPHAGOGASTRODUODENOSCOPY (EGD) WITH PROPOFOL;  Surgeon: [name], MD;  Location: [facility] ENDOSCOPY;  Service: Endoscopy;  Laterality: N/A;  . PARTIAL HYSTERECTOMY    . TUBAL LIGATION       Current Outpatient Medications  Medication Sig Dispense Refill  . acetaminophen (TYLENOL) 650 MG CR tablet Take  650 mg by mouth every 8 (eight) hours as needed for pain. Yesterday she took 1950 mg ( 3 tablets) ay 6 pm.    . albuterol (VENTOLIN HFA) 108 (90 Base) MCG/ACT inhaler Inhale 1 puff into the lungs every 4 (four) hours as needed for wheezing or shortness of breath.     No current facility-administered medications for this visit.    Allergies:   Patient has no known allergies.    ROS:  Please see the history of present illness.   Otherwise, review of systems are positive for none.   All other systems are reviewed and  negative.    PHYSICAL EXAM: VS:  BP 116/82 (BP Location: Left Arm, Patient Position: Sitting, Cuff Size: Large)   Pulse 66   Ht 5\' 9"  (1.753 m)   Wt 292 lb (132.5 kg)   BMI 43.12 kg/m  , BMI Body mass index is 43.12 kg/m. GENERAL:  Well appearing NECK:  No jugular venous distention, waveform within normal limits, carotid upstroke brisk and symmetric, no bruits, no thyromegaly LUNGS:  Clear to auscultation bilaterally CHEST:  Unremarkable HEART:  PMI not displaced or sustained,S1 and S2 within normal limits, no S3, no S4, no clicks, no rubs, no murmurs ABD:  Flat, positive bowel sounds normal in frequency in pitch, no bruits, no rebound, no guarding, no midline pulsatile mass, no hepatomegaly, no splenomegaly EXT:  2 plus pulses throughout, no edema, no cyanosis no clubbing   EKG:  EKG is  ordered today. The ekg ordered demonstrates normal sinus rhythm, rate 66 axis within normal limits, intervals within normal limits, no acute ST-T wave changes.   Recent Labs: 06/08/2019: ALT 16; TSH 0.624 12/14/2019: BUN 16; Creatinine, Ser 0.76; Hemoglobin 11.4; Platelets 304; Potassium 3.8; Sodium 139    Lipid Panel No results found for: CHOL, TRIG, HDL, CHOLHDL, VLDL, LDLCALC, LDLDIRECT    Wt Readings from Last 3 Encounters:  03/30/20 292 lb (132.5 kg)  03/02/20 295 lb (133.8 kg)  02/23/20 295 lb 10.2 oz (134.1 kg)      Other studies Reviewed: Additional studies/ records that were reviewed today include: None Review of the above records demonstrates:  Please see elsewhere in the note.     ASSESSMENT AND PLAN:  CHEST PAIN:    She has some intermittent chest discomfort that might be recurrent pericarditis.  In the future she has this and might give her steroid tapers if this is happening infrequently enough.  She is first going to try Tylenol or nonsteroidals.  HISTORY OF PERICARDITIS: As above.   HTN: Her blood pressure is controlled.  No change in therapy.  CHRONIC DIASTOLIC  DYSFUNCTION:   She is euvolemic.  No change in therapy.   Current medicines are reviewed at length with the patient today.  The patient does not have concerns regarding medicines.  The following changes have been made: As above  Labs/ tests ordered today include: None  Orders Placed This Encounter  Procedures  . EKG 12-Lead     Disposition:   FU with me as needed    Signed, 14/02/21, MD  03/30/2020 10:37 AM    [facility]

## 2020-03-30 ENCOUNTER — Encounter: Payer: Self-pay | Admitting: *Deleted

## 2020-03-30 ENCOUNTER — Encounter (HOSPITAL_COMMUNITY): Payer: 59

## 2020-03-30 ENCOUNTER — Other Ambulatory Visit: Payer: Self-pay

## 2020-03-30 ENCOUNTER — Encounter: Payer: Self-pay | Admitting: Cardiology

## 2020-03-30 ENCOUNTER — Ambulatory Visit: Payer: 59 | Admitting: Cardiology

## 2020-03-30 VITALS — BP 116/82 | HR 66 | Ht 69.0 in | Wt 292.0 lb

## 2020-03-30 DIAGNOSIS — I5032 Chronic diastolic (congestive) heart failure: Secondary | ICD-10-CM

## 2020-03-30 DIAGNOSIS — M25562 Pain in left knee: Secondary | ICD-10-CM | POA: Diagnosis not present

## 2020-03-30 DIAGNOSIS — R072 Precordial pain: Secondary | ICD-10-CM

## 2020-03-30 NOTE — Progress Notes (Signed)
Cardiac Individual Treatment Plan  Patient Details  Name: Alicia Wood MRN: [id number] Date of Birth: 03-24-1971 Referring Provider:   Flowsheet Row CARDIAC REHAB PHASE II ORIENTATION from 02/23/2020 in [facility]  Referring Provider Dr. Minus Breeding, MD      Initial Encounter Date:  [place] PHASE II ORIENTATION from 02/23/2020 in [facility]  Date 02/23/20      Visit Diagnosis: Chronic pericarditis, unspecified complication status, unspecified type  Patient's Home Medications on Admission:  Current Outpatient Medications:    acetaminophen (TYLENOL) 650 MG CR tablet, Take 650 mg by mouth every 8 (eight) hours as needed for pain. Yesterday she took 1950 mg ( 3 tablets) ay 6 pm., Disp: , Rfl:    albuterol (VENTOLIN HFA) 108 (90 Base) MCG/ACT inhaler, Inhale 1 puff into the lungs every 4 (four) hours as needed for wheezing or shortness of breath., Disp: , Rfl:   Past Medical History: Past Medical History:  Diagnosis Date   Back pain    Chest pain    a. Initially in setting of pericarditis in 11/2015; b. Ongoing cp despite prednisone, colchicine, ibuprofen,and narcotic therapy;  c. 12/2015 CTA Chest: No PE, stable small-mod pericardial effusion;  d. 12/2015 Cath: nl cors.   Chest pain    Chronic diastolic CHF (congestive heart failure) ([place])    a. 11/2015 Echo: EF 30-16%, grade 2 diastolic dysfxn.   Depression    Edema of both lower extremities    GERD (gastroesophageal reflux disease)    Headache    Hyperlipidemia    Hypertension    Patient states she does not have hypertension.    Osteoarthritis    Pericardial effusion    a. 11/23/2015 Echo: mod pericardial effusion, no tamponade; b. 12/05/2015 Echo: small circumferential pericardial effusion; c. 12/06/2015 Cardiac MRI: pericardial thickening w/ circumferential late gad enhancement of pericardium and epicardium-->suspicious for  subacute effusive constrictive pericarditis;  d. 12/19/2015 Echo: triv pericardial effusion;  e. 01/06/2016 CT Chest: small to mod pericard eff.   Pericarditis    a. 11/2015 Cardiac MRI; EF 57%, mild MR, triv TR, mild pericardial effusion, thickening of pericardium focally up to 82m w/ significant circumferential late gad enhancement of pericardium and epicardium sugg of subacute effusive constrictive pericarditis;  b. 12/2015 R Heart Cath: nl right heart pressures w/o evidence of constrictive physiology.   Torn meniscus     Tobacco Use: Social History   Tobacco Use  Smoking Status Never Smoker  Smokeless Tobacco Never Used    Labs: Recent Review Flowsheet Data    Labs for ITP Cardiac and Pulmonary Rehab Latest Ref Rng & Units 01/21/2016 01/21/2016 06/08/2019   Hemoglobin A1c 4.8 - 5.6 % - - 6.2(H)   PHART 7.350 - 7.450 7.373 - -   PCO2ART 32.0 - 48.0 mmHg 54.7(H) - -   HCO3 20.0 - 28.0 mmol/L 31.9(H) 30.7(H) -   TCO2 0 - 100 mmol/L 33 32 -   O2SAT % 94.0 74.0 -      Capillary Blood Glucose: Lab Results  Component Value Date   GLUCAP 92 01/21/2016   GLUCAP 99 12/10/2015   GLUCAP 110 (H) 12/10/2015   GLUCAP 138 (H) 12/09/2015   GLUCAP 112 (H) 12/09/2015     Exercise Target Goals: Exercise Program Goal: Individual exercise prescription set using results from initial 6 min walk test and THRR while considering  patients activity barriers and safety.   Exercise Prescription Goal: Starting with aerobic  activity 30 plus minutes a day, 3 days per week for initial exercise prescription. Provide home exercise prescription and guidelines that participant acknowledges understanding prior to discharge.  Activity Barriers & Risk Stratification:  Activity Barriers & Cardiac Risk Stratification - 02/23/20 1329      Activity Barriers & Cardiac Risk Stratification   Activity Barriers Joint Problems;History of Falls    Cardiac Risk Stratification Moderate           6 Minute  Walk:  6 Minute Walk    Row Name 02/23/20 1038         6 Minute Walk   Phase Initial     Distance 1400 feet     Walk Time 6 minutes     # of Rest Breaks 0     MPH 2.7     METS 3.1     RPE 11     Perceived Dyspnea  0     VO2 Peak 11     Symptoms No     Resting HR 67 bpm     Resting BP 124/60     Resting Oxygen Saturation  99 %     Exercise Oxygen Saturation  during 6 min walk 99 %     Max Ex. HR 120 bpm     Max Ex. BP 118/70     2 Minute Post BP 112/70            Oxygen Initial Assessment:   Oxygen Re-Evaluation:   Oxygen Discharge (Final Oxygen Re-Evaluation):   Initial Exercise Prescription:  Initial Exercise Prescription - 02/23/20 1300      Date of Initial Exercise RX and Referring Provider   Date 02/23/20    Referring Provider Dr. Minus Breeding, MD    Expected Discharge Date 04/20/20      Treadmill   MPH 2.4    Grade 1    Minutes 15    METs 3.17      NuStep   Level 2    SPM 85    Minutes 15    METs 2      Prescription Details   Frequency (times per week) 3    Duration Progress to 30 minutes of continuous aerobic without signs/symptoms of physical distress      Intensity   THRR 40-80% of Max Heartrate 68-135    Ratings of Perceived Exertion 11-13    Perceived Dyspnea 0-4      Progression   Progression Continue progressive overload as per policy without signs/symptoms or physical distress.      Resistance Training   Training Prescription Yes    Weight 3 lbs    Reps 10-15           Perform Capillary Blood Glucose checks as needed.  Exercise Prescription Changes:   Exercise Prescription Changes    Row Name 02/27/20 1200 03/12/20 1033 03/12/20 1346         Response to Exercise   Blood Pressure (Admit) 118/80 132/84 132/84 (P)      Blood Pressure (Exercise) 128/78 122/64 122/64 (P)      Blood Pressure (Exit) 114/70 110/70 110/70 (P)      Heart Rate (Admit) 74 bpm 68 bpm 68 bpm (P)      Heart Rate (Exercise) 112 bpm 119 bpm  119 bpm (P)      Heart Rate (Exit) 75 bpm 61 bpm 61 bpm (P)      Rating of Perceived Exertion (Exercise) 12 11 11  (P)  Perceived Dyspnea (Exercise) 0 -- --     Symptoms None None None (P)      Comments Pt's first day of exerciuse in the CRP2 program Reviewed METs Reviewed METs (P)      Duration Progress to 30 minutes of  aerobic without signs/symptoms of physical distress Continue with 30 min of aerobic exercise without signs/symptoms of physical distress. Continue with 30 min of aerobic exercise without signs/symptoms of physical distress. (P)      Intensity THRR unchanged THRR unchanged THRR unchanged (P)            Progression   Progression Continue to progress workloads to maintain intensity without signs/symptoms of physical distress. Continue to progress workloads to maintain intensity without signs/symptoms of physical distress. Continue to progress workloads to maintain intensity without signs/symptoms of physical distress. (P)      Average METs 2.8 2.4 2.4 (P)            Resistance Training   Training Prescription Yes Yes Yes (P)      Weight 3 lbs 3 lbs 3 lbs (P)      Reps 10-15 10-15 10-15 (P)      Time 10 Minutes 10 Minutes 10 Minutes (P)            Interval Training   Interval Training No No No (P)            Treadmill   MPH 2.4 1.7 1.7 (P)      Grade 1 0 0 (P)      Minutes 15 15 15  (P)      METs 3.17 2.3 2.3 (P)            NuStep   Level 2 2 --     SPM 85 85 --     Minutes 15 15 --     METs 2.5 2.4 --            Exercise Comments:   Exercise Comments    Row Name 02/27/20 1256 03/12/20 1037 04/02/20 1045       Exercise Comments Pt's first day of exercise in the CRP2 program. Pt tolerated the session well with no complaints. Reviewed METs. Pt MET level not as high as usual due to dietafy consult during exercise. Pt is making good progress and is motivated to attend the [place] program. Reviewed goals. Will review home exercise Rx on 04/04/2020 so pt may  begin to increase the frequency of exercise to help her achieve weight loss goals.            Exercise Goals and Review:   Exercise Goals    Row Name 02/23/20 1420             Exercise Goals   Increase Physical Activity Yes       Intervention Provide advice, education, support and counseling about physical activity/exercise needs.;Develop an individualized exercise prescription for aerobic and resistive training based on initial evaluation findings, risk stratification, comorbidities and participant's personal goals.       Expected Outcomes Short Term: Attend rehab on a regular basis to increase amount of physical activity.;Long Term: Add in home exercise to make exercise part of routine and to increase amount of physical activity.;Long Term: Exercising regularly at least 3-5 days a week.       Increase Strength and Stamina Yes       Intervention Provide advice, education, support and counseling about physical activity/exercise needs.;Develop an individualized exercise prescription for aerobic and  resistive training based on initial evaluation findings, risk stratification, comorbidities and participant's personal goals.       Expected Outcomes Short Term: Increase workloads from initial exercise prescription for resistance, speed, and METs.;Short Term: Perform resistance training exercises routinely during rehab and add in resistance training at home;Long Term: Improve cardiorespiratory fitness, muscular endurance and strength as measured by increased METs and functional capacity (6MWT)       Able to understand and use rate of perceived exertion (RPE) scale Yes       Intervention Provide education and explanation on how to use RPE scale       Expected Outcomes Short Term: Able to use RPE daily in rehab to express subjective intensity level;Long Term:  Able to use RPE to guide intensity level when exercising independently       Knowledge and understanding of Target Heart Rate Range (THRR)  Yes       Intervention Provide education and explanation of THRR including how the numbers were predicted and where they are located for reference       Expected Outcomes Short Term: Able to state/look up THRR;Long Term: Able to use THRR to govern intensity when exercising independently;Short Term: Able to use daily as guideline for intensity in rehab       Understanding of Exercise Prescription Yes       Intervention Provide education, explanation, and written materials on patient's individual exercise prescription       Expected Outcomes Short Term: Able to explain program exercise prescription;Long Term: Able to explain home exercise prescription to exercise independently              Exercise Goals Re-Evaluation :  Exercise Goals Re-Evaluation    Row Name 02/27/20 1254 04/02/20 1030           Exercise Goal Re-Evaluation   Exercise Goals Review Increase Physical Activity;Increase Strength and Stamina;Able to understand and use rate of perceived exertion (RPE) scale;Knowledge and understanding of Target Heart Rate Range (THRR);Understanding of Exercise Prescription Increase Physical Activity;Increase Strength and Stamina;Able to understand and use rate of perceived exertion (RPE) scale;Knowledge and understanding of Target Heart Rate Range (THRR);Able to check pulse independently;Understanding of Exercise Prescription      Comments Pt's first day of exercise in the CRP2 program. Pt tolerated  the session well and understands the exercise Rx, THRR, and RPE scale. Reviewed Goals. Pt has made good progress in the CRP2 program, She is motivated and demonstates desire to improve CV fitness.      Expected Outcomes Will continue to monitor patient and progress exercise workloads as tolerated. Will continue to monitor patient and progress exercise workloads as tolerated.              Discharge Exercise Prescription (Final Exercise Prescription Changes):  Exercise Prescription Changes -  03/12/20 1346      Response to Exercise   Blood Pressure (Admit) 132/84 (P)     Blood Pressure (Exercise) 122/64 (P)     Blood Pressure (Exit) 110/70 (P)     Heart Rate (Admit) 68 bpm (P)     Heart Rate (Exercise) 119 bpm (P)     Heart Rate (Exit) 61 bpm (P)     Rating of Perceived Exertion (Exercise) 11 (P)     Symptoms None (P)     Comments Reviewed METs (P)     Duration Continue with 30 min of aerobic exercise without signs/symptoms of physical distress. (P)     Intensity THRR unchanged (  P)       Progression   Progression Continue to progress workloads to maintain intensity without signs/symptoms of physical distress. (P)     Average METs 2.4 (P)       Resistance Training   Training Prescription Yes (P)     Weight 3 lbs (P)     Reps 10-15 (P)     Time 10 Minutes (P)       Interval Training   Interval Training No (P)       Treadmill   MPH 1.7 (P)     Grade 0 (P)     Minutes 15 (P)     METs 2.3 (P)            Nutrition:  Target Goals: Understanding of nutrition guidelines, daily intake of sodium <1573m, cholesterol <2069m calories 30% from fat and 7% or less from saturated fats, daily to have 5 or more servings of fruits and vegetables.  Biometrics:  Pre Biometrics - 02/23/20 0930      Pre Biometrics   Waist Circumference 54 inches    Hip Circumference 59.25 inches    Waist to Hip Ratio 0.91 %    Triceps Skinfold 55 mm    % Body Fat 57.1 %    Grip Strength 44 kg    Flexibility 11.75 in    Single Leg Stand 9.31 seconds   Moderate fall risk           Nutrition Therapy Plan and Nutrition Goals:  Nutrition Therapy & Goals - 03/02/20 1205      Nutrition Therapy   Diet TLC; weight loss      Personal Nutrition Goals   Nutrition Goal Pt to identify food quantities necessary to achieve weight loss of 6-24 lb at graduation from cardiac rehab.    Personal Goal #2 Pt to build a healthy plate including vegetables, fruits, whole grains, and low-fat dairy  products in a heart healthy meal plan.    Personal Goal #3 Pt to learn to meal prep    Personal Goal #4 Pt to reduce eating out to <2 times per week      Intervention Plan   Intervention Prescribe, educate and counsel regarding individualized specific dietary modifications aiming towards targeted core components such as weight, hypertension, lipid management, diabetes, heart failure and other comorbidities.    Expected Outcomes Short Term Goal: A plan has been developed with personal nutrition goals set during dietitian appointment.;Long Term Goal: Adherence to prescribed nutrition plan.           Nutrition Assessments:  MEDIFICTS Score Key:  ?70 Need to make dietary changes   40-70 Heart Healthy Diet  ? 40 Therapeutic Level Cholesterol Diet  Flowsheet Row CARDIAC REHAB [facility] on [facility]     Picture Your Plate Scores:  <4<17nhealthy dietary pattern with much room for improvement.  41-50 Dietary pattern unlikely to meet recommendations for good health and room for improvement.  51-60 More healthful dietary pattern, with some room for improvement.   >60 Healthy dietary pattern, although there may be some specific behaviors that could be improved.    Nutrition Goals Re-Evaluation:  Nutrition Goals Re-Evaluation    [place] 03/02/20 1206 04/02/20 1235           Goals   Current Weight 295 lb (133.8 kg) 291 lb 10.7 oz (132.3 kg)  Nutrition Goal Pt to identify food quantities necessary to achieve weight loss of 6-24 lb at graduation from cardiac rehab. Pt to identify food quantities necessary to achieve weight loss of 6-24 lb at graduation from cardiac rehab.      Comment -- Weight loss 4 lbs, she has been meal prepping and purchasing healthier convenience foods.             Personal Goal #2 Re-Evaluation   Personal Goal #2 Pt to build a healthy plate including  vegetables, fruits, whole grains, and low-fat dairy products in a heart healthy meal plan. Pt to build a healthy plate including vegetables, fruits, whole grains, and low-fat dairy products in a heart healthy meal plan.             Personal Goal #3 Re-Evaluation   Personal Goal #3 Pt to learn to meal prep Pt to learn to meal prep             Personal Goal #4 Re-Evaluation   Personal Goal #4 Pt to reduce eating out to <2 times per week Pt to reduce eating out to <2 times per week             Nutrition Goals Discharge (Final Nutrition Goals Re-Evaluation):  Nutrition Goals Re-Evaluation - 04/02/20 1235      Goals   Current Weight 291 lb 10.7 oz (132.3 kg)    Nutrition Goal Pt to identify food quantities necessary to achieve weight loss of 6-24 lb at graduation from cardiac rehab.    Comment Weight loss 4 lbs, she has been meal prepping and purchasing healthier convenience foods.      Personal Goal #2 Re-Evaluation   Personal Goal #2 Pt to build a healthy plate including vegetables, fruits, whole grains, and low-fat dairy products in a heart healthy meal plan.      Personal Goal #3 Re-Evaluation   Personal Goal #3 Pt to learn to meal prep      Personal Goal #4 Re-Evaluation   Personal Goal #4 Pt to reduce eating out to <2 times per week           Psychosocial: Target Goals: Acknowledge presence or absence of significant depression and/or stress, maximize coping skills, provide positive support system. Participant is able to verbalize types and ability to use techniques and skills needed for reducing stress and depression.  Initial Review & Psychosocial Screening:  Initial Psych Review & Screening - 02/23/20 1501      Initial Review   Current issues with History of Depression;Current Stress Concerns    Source of Stress Concerns Occupation;Chronic Illness    Comments Alicia Wood has health concerns regarding having pericarditis and works two jobs at night.      Family Dynamics    Good Support System? Yes   Alicia Wood recently got married and has her husband and children for support     Barriers   Psychosocial barriers to participate in program The patient should benefit from training in stress management and relaxation.      Screening Interventions   Interventions Encouraged to exercise    Expected Outcomes Long Term Goal: Stressors or current issues are controlled or eliminated.;Short Term goal: Identification and review with participant of any Quality of Life or Depression concerns found by scoring the questionnaire.;Long Term goal: The participant improves quality of Life and PHQ9 Scores as seen by post scores and/or verbalization of changes           Quality of Life Scores:  Quality of Life - 02/23/20 1322      Quality of Life   Select Quality of Life      Quality of Life Scores   Health/Function Pre 21.03 %    Socioeconomic Pre 22.29 %    Psych/Spiritual Pre 22.29 %    Family Pre 27.6 %    GLOBAL Pre 22.51 %          Scores of 19 and below usually indicate a poorer quality of life in these areas.  A difference of  2-3 points is a clinically meaningful difference.  A difference of 2-3 points in the total score of the Quality of Life Index has been associated with significant improvement in overall quality of life, self-image, physical symptoms, and general health in studies assessing change in quality of life.  PHQ-9: Recent Review Flowsheet Data    Depression screen [facility] 2/9 02/23/2020 02/23/2020 06/08/2019 01/28/2016 01/16/2016   Decreased Interest 0 0 2 0 2   Down, Depressed, Hopeless 0 0 1 0 2   PHQ - 2 Score 0 0 3 0 4   Altered sleeping - - 2 - 3   Tired, decreased energy - - 1 - 2   Change in appetite - - 2 - 2   Feeling bad or failure about yourself  - - 0 - 0   Trouble concentrating - - 0 - 2   Moving slowly or fidgety/restless - - 0 - 0   Suicidal thoughts - - 0 - 0   PHQ-9 Score - - 8 - 13   Difficult doing work/chores - - Not difficult at all -  Not difficult at all     Interpretation of Total Score  Total Score Depression Severity:  1-4 = Minimal depression, 5-9 = Mild depression, 10-14 = Moderate depression, 15-19 = Moderately severe depression, 20-27 = Severe depression   Psychosocial Evaluation and Intervention:   Psychosocial Re-Evaluation:  Psychosocial Re-Evaluation    Row Name 03/06/20 1126 03/29/20 1231           Psychosocial Re-Evaluation   Current issues with History of Depression;Current Sleep Concerns History of Depression;Current Sleep Concerns      Comments Will continue to monitor and offer support as needed Will continue to monitor and offer support as needed      Expected Outcomes Alicia Wood will have less stress upon completion of phase 2 cardiac rehab. Alicia Wood will have less stress upon completion of phase 2 cardiac rehab.      Continue Psychosocial Services  No Follow up required No Follow up required             Psychosocial Discharge (Final Psychosocial Re-Evaluation):  Psychosocial Re-Evaluation - 03/29/20 1231      Psychosocial Re-Evaluation   Current issues with History of Depression;Current Sleep Concerns    Comments Will continue to monitor and offer support as needed    Expected Outcomes Alicia Wood will have less stress upon completion of phase 2 cardiac rehab.    Continue Psychosocial Services  No Follow up required           Vocational Rehabilitation: Provide vocational rehab assistance to qualifying candidates.   Vocational Rehab Evaluation & Intervention:  Vocational Rehab - 02/23/20 1216      Initial Vocational Rehab Evaluation & Intervention   Assessment shows need for Vocational Rehabilitation No      Vocational Rehab Re-Evaulation   Comments Jeannene Patella is currently working full time and does not need vocational rehab at this  time           Education: Education Goals: Education classes will be provided on a weekly basis, covering required topics. Participant will state understanding/return  demonstration of topics presented.  Learning Barriers/Preferences:  Learning Barriers/Preferences - 02/23/20 1323      Learning Barriers/Preferences   Learning Barriers None    Learning Preferences None           Education Topics: Hypertension, Hypertension Reduction -Define heart disease and high blood pressure. Discus how high blood pressure affects the body and ways to reduce high blood pressure.   Exercise and Your Heart -Discuss why it is important to exercise, the FITT principles of exercise, normal and abnormal responses to exercise, and how to exercise safely.   Angina -Discuss definition of angina, causes of angina, treatment of angina, and how to decrease risk of having angina.   Cardiac Medications -Review what the following cardiac medications are used for, how they affect the body, and side effects that may occur when taking the medications.  Medications include Aspirin, Beta blockers, calcium channel blockers, ACE Inhibitors, angiotensin receptor blockers, diuretics, digoxin, and antihyperlipidemics.   Congestive Heart Failure -Discuss the definition of CHF, how to live with CHF, the signs and symptoms of CHF, and how keep track of weight and sodium intake.   Heart Disease and Intimacy -Discus the effect sexual activity has on the heart, how changes occur during intimacy as we age, and safety during sexual activity.   Smoking Cessation / COPD -Discuss different methods to quit smoking, the health benefits of quitting smoking, and the definition of COPD.   Nutrition I: Fats -Discuss the types of cholesterol, what cholesterol does to the heart, and how cholesterol levels can be controlled.   Nutrition II: Labels -Discuss the different components of food labels and how to read food label   Heart Parts/Heart Disease and PAD -Discuss the anatomy of the heart, the pathway of blood circulation through the heart, and these are affected by heart  disease.   Stress I: Signs and Symptoms -Discuss the causes of stress, how stress may lead to anxiety and depression, and ways to limit stress.   Stress II: Relaxation -Discuss different types of relaxation techniques to limit stress.   Warning Signs of Stroke / TIA -Discuss definition of a stroke, what the signs and symptoms are of a stroke, and how to identify when someone is having stroke.   Knowledge Questionnaire Score:  Knowledge Questionnaire Score - 02/23/20 1324      Knowledge Questionnaire Score   Pre Score 17/24           Core Components/Risk Factors/Patient Goals at Admission:  Personal Goals and Risk Factors at Admission - 02/23/20 1506      Core Components/Risk Factors/Patient Goals on Admission    Weight Management Yes;Obesity;Weight Loss    Intervention Weight Management: Develop a combined nutrition and exercise program designed to reach desired caloric intake, while maintaining appropriate intake of nutrient and fiber, sodium and fats, and appropriate energy expenditure required for the weight goal.;Weight Management: Provide education and appropriate resources to help participant work on and attain dietary goals.;Weight Management/Obesity: Establish reasonable short term and long term weight goals.;Obesity: Provide education and appropriate resources to help participant work on and attain dietary goals.    Admit Weight 295 lb 6.7 oz (134 kg)    Expected Outcomes Short Term: Continue to assess and modify interventions until short term weight is achieved;Long Term: Adherence to nutrition and physical  activity/exercise program aimed toward attainment of established weight goal;Weight Maintenance: Understanding of the daily nutrition guidelines, which includes 25-35% calories from fat, 7% or less cal from saturated fats, less than 245m cholesterol, less than 1.5gm of sodium, & 5 or more servings of fruits and vegetables daily;Weight Loss: Understanding of general  recommendations for a balanced deficit meal plan, which promotes 1-2 lb weight loss per week and includes a negative energy balance of [phone] kcal/d;Understanding recommendations for meals to include 15-35% energy as protein, 25-35% energy from fat, 35-60% energy from carbohydrates, less than 201mof dietary cholesterol, 20-35 gm of total fiber daily;Understanding of distribution of calorie intake throughout the day with the consumption of 4-5 meals/snacks    Diabetes Yes    Intervention Provide education about proper nutrition, including hydration, and aerobic/resistive exercise prescription along with prescribed medications to achieve blood glucose in normal ranges: Fasting glucose 65-99 mg/dL;Provide education about signs/symptoms and action to take for hypo/hyperglycemia.    Expected Outcomes Short Term: Participant verbalizes understanding of the signs/symptoms and immediate care of hyper/hypoglycemia, proper foot care and importance of medication, aerobic/resistive exercise and nutrition plan for blood glucose control.;Long Term: Attainment of HbA1C < 7%.    Heart Failure Yes    Intervention Provide a combined exercise and nutrition program that is supplemented with education, support and counseling about heart failure. Directed toward relieving symptoms such as shortness of breath, decreased exercise tolerance, and extremity edema.    Expected Outcomes Short term: Attendance in program 2-3 days a week with increased exercise capacity. Reported lower sodium intake. Reported increased fruit and vegetable intake. Reports medication compliance.;Short term: Daily weights obtained and reported for increase. Utilizing diuretic protocols set by physician.;Long term: Adoption of self-care skills and reduction of barriers for early signs and symptoms recognition and intervention leading to self-care maintenance.    Stress Yes    Intervention Offer individual and/or small group education and counseling on  adjustment to heart disease, stress management and health-related lifestyle change. Teach and support self-help strategies.;Refer participants experiencing significant psychosocial distress to appropriate mental health specialists for further evaluation and treatment. When possible, include family members and significant others in education/counseling sessions.    Expected Outcomes Short Term: Participant demonstrates changes in health-related behavior, relaxation and other stress management skills, ability to obtain effective social support, and compliance with psychotropic medications if prescribed.;Long Term: Emotional wellbeing is indicated by absence of clinically significant psychosocial distress or social isolation.           Core Components/Risk Factors/Patient Goals Review:   Goals and Risk Factor Review    Row Name 02/28/20 1012 02/28/20 1013 03/06/20 1128 03/29/20 1232       Core Components/Risk Factors/Patient Goals Review   Personal Goals Review Weight Management/Obesity;Heart Failure;Stress Weight Management/Obesity;Heart Failure;Stress;Diabetes Weight Management/Obesity;Heart Failure;Stress;Diabetes Weight Management/Obesity;Heart Failure;Stress;Diabetes    Review -- Alicia Wood did well on her first day of exercise. Alicia Wood's vital signs were stable. Alicia Wood is doing well with exercise at cardiac rehab. Alicia Wood's vital signs have been stable Alicia Wood is doing well with exercise at cardiac rehab. Alicia Wood's vital signs have been stable. Alicia Wood has had some problems with her knee and has been using the treadmill instead of the nustep    Expected Outcomes -- Alicia Wood will continue to participate in phase 2 cardiac rehab for exercise, nutrition and lifestyle modifications Alicia Wood will continue to participate in phase 2 cardiac rehab for exercise, nutrition and lifestyle modifications Alicia Wood will continue to participate in phase 2 cardiac rehab for exercise,  nutrition and lifestyle modifications           Core Components/Risk  Factors/Patient Goals at Discharge (Final Review):   Goals and Risk Factor Review - 03/29/20 1232      Core Components/Risk Factors/Patient Goals Review   Personal Goals Review Weight Management/Obesity;Heart Failure;Stress;Diabetes    Review Alicia Wood is doing well with exercise at cardiac rehab. Alicia Wood's vital signs have been stable. Alicia Wood has had some problems with her knee and has been using the treadmill instead of the nustep    Expected Outcomes Alicia Wood will continue to participate in phase 2 cardiac rehab for exercise, nutrition and lifestyle modifications           ITP Comments:  ITP Comments    Row Name 02/23/20 1215 03/06/20 1125 03/29/20 1226       ITP Comments Dr Fransico Him MD, Medical Director 30 ITP Review. Alicia Wood is off to a good start to exercise at cardiac rehab 30 Day ITP Review. Alicia Wood has good attendance and participation despite a busy work schedule            Comments: See ITP comments.Barnet Pall, RN,BSN 04/03/2020 2:36 PM

## 2020-03-30 NOTE — Patient Instructions (Signed)
Medication Instructions:  Your physician recommends that you continue on your current medications as directed. Please refer to the Current Medication list given to you today.  Labwork: none  Testing/Procedures: none  Follow-Up: As needed   Any Other Special Instructions Will Be Listed Below (If Applicable).  DO NOT USE THE FLOOR BUFFER   If you need a refill on your cardiac medications before your next appointment, please call your pharmacy.

## 2020-04-02 ENCOUNTER — Other Ambulatory Visit: Payer: Self-pay

## 2020-04-02 ENCOUNTER — Encounter (HOSPITAL_COMMUNITY)
Admission: RE | Admit: 2020-04-02 | Discharge: 2020-04-02 | Disposition: A | Discharge status: Home / Self Care | Payer: 59 | Source: Ambulatory Visit | Attending: Cardiology | Admitting: Cardiology

## 2020-04-02 DIAGNOSIS — I319 Disease of pericardium, unspecified: Secondary | ICD-10-CM

## 2020-04-04 ENCOUNTER — Other Ambulatory Visit: Payer: Self-pay

## 2020-04-04 ENCOUNTER — Encounter (HOSPITAL_COMMUNITY)
Admission: RE | Admit: 2020-04-04 | Discharge: 2020-04-04 | Disposition: A | Discharge status: Home / Self Care | Payer: 59 | Source: Ambulatory Visit | Attending: Cardiology | Admitting: Cardiology

## 2020-04-04 DIAGNOSIS — I319 Disease of pericardium, unspecified: Secondary | ICD-10-CM | POA: Diagnosis not present

## 2020-04-04 NOTE — Progress Notes (Signed)
Reviewed home exercise Rx with patient today. Pt has not ben exercising at home but is ready to begin walking at least 2x/week for 30 minutes to improve  CV fitness and promote her goal of weight loss. Pt will walk at home/work for 30 minutes /(15 min intervals) 2x/week. Encouraged warm-up, cool-down, and stretching. Hydration encouraged before, during and after exercise. Reviewed THRR of 68-135 and keeping her RPE between 11-13 with activity. Reviewed weather parameters for temperature and humidity for when exercising outdoors. We also reviewed S/S that would require pt to terminate exercise and when to call MD vs 911. Pt verbalized understanding of the home exercise Rx and was provided a written copy.   Lorin Picket MS, ACSM-EP-C, CCRP

## 2020-04-06 ENCOUNTER — Encounter (HOSPITAL_COMMUNITY)
Admission: RE | Admit: 2020-04-06 | Discharge: 2020-04-06 | Disposition: A | Discharge status: Home / Self Care | Payer: 59 | Source: Ambulatory Visit | Attending: Cardiology | Admitting: Cardiology

## 2020-04-06 ENCOUNTER — Other Ambulatory Visit: Payer: Self-pay

## 2020-04-06 DIAGNOSIS — I319 Disease of pericardium, unspecified: Secondary | ICD-10-CM | POA: Diagnosis not present

## 2020-04-09 ENCOUNTER — Encounter (HOSPITAL_COMMUNITY): Payer: 59

## 2020-04-11 ENCOUNTER — Encounter (HOSPITAL_COMMUNITY)
Admission: RE | Admit: 2020-04-11 | Discharge: 2020-04-11 | Disposition: A | Discharge status: Home / Self Care | Payer: 59 | Source: Ambulatory Visit | Attending: Cardiology | Admitting: Cardiology

## 2020-04-11 ENCOUNTER — Other Ambulatory Visit: Payer: Self-pay

## 2020-04-11 DIAGNOSIS — I319 Disease of pericardium, unspecified: Secondary | ICD-10-CM

## 2020-04-13 ENCOUNTER — Other Ambulatory Visit: Payer: Self-pay

## 2020-04-13 ENCOUNTER — Encounter (HOSPITAL_COMMUNITY)
Admission: RE | Admit: 2020-04-13 | Discharge: 2020-04-13 | Disposition: A | Discharge status: Home / Self Care | Payer: 59 | Source: Ambulatory Visit | Attending: Cardiology | Admitting: Cardiology

## 2020-04-13 DIAGNOSIS — I319 Disease of pericardium, unspecified: Secondary | ICD-10-CM | POA: Diagnosis not present

## 2020-04-16 ENCOUNTER — Encounter (HOSPITAL_COMMUNITY)
Admission: RE | Admit: 2020-04-16 | Discharge: 2020-04-16 | Disposition: A | Discharge status: Home / Self Care | Payer: 59 | Source: Ambulatory Visit | Attending: Cardiology | Admitting: Cardiology

## 2020-04-16 ENCOUNTER — Other Ambulatory Visit: Payer: Self-pay

## 2020-04-16 DIAGNOSIS — I300 Acute nonspecific idiopathic pericarditis: Secondary | ICD-10-CM

## 2020-04-16 DIAGNOSIS — I319 Disease of pericardium, unspecified: Secondary | ICD-10-CM

## 2020-04-18 ENCOUNTER — Encounter (HOSPITAL_COMMUNITY)
Admission: RE | Admit: 2020-04-18 | Discharge: 2020-04-18 | Disposition: A | Discharge status: Home / Self Care | Payer: 59 | Source: Ambulatory Visit | Attending: Cardiology | Admitting: Cardiology

## 2020-04-18 ENCOUNTER — Other Ambulatory Visit: Payer: Self-pay

## 2020-04-18 VITALS — Ht 67.5 in | Wt 292.8 lb

## 2020-04-18 DIAGNOSIS — I319 Disease of pericardium, unspecified: Secondary | ICD-10-CM | POA: Diagnosis not present

## 2020-04-18 DIAGNOSIS — I300 Acute nonspecific idiopathic pericarditis: Secondary | ICD-10-CM

## 2020-04-20 ENCOUNTER — Other Ambulatory Visit: Payer: Self-pay

## 2020-04-20 ENCOUNTER — Encounter (HOSPITAL_COMMUNITY)
Admission: RE | Admit: 2020-04-20 | Discharge: 2020-04-20 | Disposition: A | Discharge status: Home / Self Care | Payer: 59 | Source: Ambulatory Visit | Attending: Cardiology | Admitting: Cardiology

## 2020-04-20 DIAGNOSIS — I319 Disease of pericardium, unspecified: Secondary | ICD-10-CM | POA: Diagnosis not present

## 2020-04-20 DIAGNOSIS — I300 Acute nonspecific idiopathic pericarditis: Secondary | ICD-10-CM

## 2020-04-20 NOTE — Progress Notes (Signed)
Discharge Progress Report  Patient Details  Name: Alicia Wood MRN: [id number] Date of Birth: [birth year] Referring Provider:   Flowsheet Row CARDIAC REHAB PHASE II ORIENTATION from 02/23/2020 in [place]  Referring Provider Dr. Minus Breeding, MD       Number of Visits: 18  Reason for Discharge:  Patient reached a stable level of exercise. Patient independent in their exercise. Patient has met program and personal goals.  Smoking History:  Social History   Tobacco Use  Smoking Status Never Smoker  Smokeless Tobacco Never Used    Diagnosis:  Chronic pericarditis, unspecified complication status, unspecified type  Acute idiopathic pericarditis  ADL UCSD:   Initial Exercise Prescription:  Initial Exercise Prescription - 02/23/20 1300      Date of Initial Exercise RX and Referring Provider   Date 02/23/20    Referring Provider Dr. Minus Breeding, MD    Expected Discharge Date 04/20/20      Treadmill   MPH 2.4    Grade 1    Minutes 15    METs 3.17      NuStep   Level 2    SPM 85    Minutes 15    METs 2      Prescription Details   Frequency (times per week) 3    Duration Progress to 30 minutes of continuous aerobic without signs/symptoms of physical distress      Intensity   THRR 40-80% of Max Heartrate 68-135    Ratings of Perceived Exertion 11-13    Perceived Dyspnea 0-4      Progression   Progression Continue progressive overload as per policy without signs/symptoms or physical distress.      Resistance Training   Training Prescription Yes    Weight 3 lbs    Reps 10-15           Discharge Exercise Prescription (Final Exercise Prescription Changes):  Exercise Prescription Changes - 04/20/20 1000      Response to Exercise   Blood Pressure (Admit) 118/72    Blood Pressure (Exercise) 122/70    Blood Pressure (Exit) 110/68    Heart Rate (Admit) 76 bpm    Heart Rate (Exercise) 121 bpm    Heart Rate (Exit) 76  bpm    Rating of Perceived Exertion (Exercise) 12    Symptoms None    Comments Pt graduated from the [facility] program today.    Duration Continue with 30 min of aerobic exercise without signs/symptoms of physical distress.    Intensity THRR unchanged      Progression   Progression Continue to progress workloads to maintain intensity without signs/symptoms of physical distress.    Average METs 3.2      Resistance Training   Training Prescription Yes    Weight 3 lbs    Reps 10-15    Time 10 Minutes      Interval Training   Interval Training No      Treadmill   MPH 2.4    Grade 3    Minutes 15    METs 3.83      NuStep   Level 4    SPM 85    Minutes 15    METs 2.5      Home Exercise Plan   Plans to continue exercise at Home (comment)    Frequency Add 2 additional days to program exercise sessions.    Initial Home Exercises Provided 04/04/20  Functional Capacity:  6 Minute Walk    Row Name 02/23/20 1038 04/16/20 0908       6 Minute Walk   Phase Initial Discharge    Distance 1400 feet 1692 feet    Distance % Change -- 20.86 %    Distance Feet Change -- 292 ft    Walk Time 6 minutes 6 minutes    # of Rest Breaks 0 0    MPH 2.7 3.2    METS 3.1 3.6    RPE 11 8    Perceived Dyspnea  0 0    VO2 Peak 11 12.6    Symptoms No No    Resting HR 67 bpm 80 bpm    Resting BP 124/60 112/76    Resting Oxygen Saturation  99 % 98 %    Exercise Oxygen Saturation  during 6 min walk 99 % 100 %    Max Ex. HR 120 bpm 109 bpm    Max Ex. BP 118/70 122/80    2 Minute Post BP 112/70 --           Psychological, QOL, Others - Outcomes: PHQ 2/9: Depression screen [facility] 2/9 05/09/2020 04/20/2020 02/23/2020 02/23/2020 06/08/2019  Decreased Interest 0 0 0 0 2  Down, Depressed, Hopeless 0 0 0 0 1  PHQ - 2 Score 0 0 0 0 3  Altered sleeping - - - - 2  Tired, decreased energy - - - - 1  Change in appetite - - - - 2  Feeling bad or failure about yourself  - - - - 0  Trouble  concentrating - - - - 0  Moving slowly or fidgety/restless - - - - 0  Suicidal thoughts - - - - 0  PHQ-9 Score - - - - 8  Difficult doing work/chores - - - - Not difficult at all    Quality of Life:  Quality of Life - 04/20/20 1010      Quality of Life   Select Quality of Life      Quality of Life Scores   Health/Function Post 25.8 %    Socioeconomic Post 23.79 %    Psych/Spiritual Post 26.57 %    Family Post 26.4 %    GLOBAL Post 25.63 %           Personal Goals: Goals established at orientation with interventions provided to work toward goal.  Personal Goals and Risk Factors at Admission - 02/23/20 1506      Core Components/Risk Factors/Patient Goals on Admission    Weight Management Yes;Obesity;Weight Loss    Intervention Weight Management: Develop a combined nutrition and exercise program designed to reach desired caloric intake, while maintaining appropriate intake of nutrient and fiber, sodium and fats, and appropriate energy expenditure required for the weight goal.;Weight Management: Provide education and appropriate resources to help participant work on and attain dietary goals.;Weight Management/Obesity: Establish reasonable short term and long term weight goals.;Obesity: Provide education and appropriate resources to help participant work on and attain dietary goals.    Admit Weight 295 lb 6.7 oz (134 kg)    Expected Outcomes Short Term: Continue to assess and modify interventions until short term weight is achieved;Long Term: Adherence to nutrition and physical activity/exercise program aimed toward attainment of established weight goal;Weight Maintenance: Understanding of the daily nutrition guidelines, which includes 25-35% calories from fat, 7% or less cal from saturated fats, less than 251m cholesterol, less than 1.5gm of sodium, & 5 or more servings of  fruits and vegetables daily;Weight Loss: Understanding of general recommendations for a balanced deficit meal plan,  which promotes 1-2 lb weight loss per week and includes a negative energy balance of [phone] kcal/d;Understanding recommendations for meals to include 15-35% energy as protein, 25-35% energy from fat, 35-60% energy from carbohydrates, less than 249m of dietary cholesterol, 20-35 gm of total fiber daily;Understanding of distribution of calorie intake throughout the day with the consumption of 4-5 meals/snacks    Diabetes Yes    Intervention Provide education about proper nutrition, including hydration, and aerobic/resistive exercise prescription along with prescribed medications to achieve blood glucose in normal ranges: Fasting glucose 65-99 mg/dL;Provide education about signs/symptoms and action to take for hypo/hyperglycemia.    Expected Outcomes Short Term: Participant verbalizes understanding of the signs/symptoms and immediate care of hyper/hypoglycemia, proper foot care and importance of medication, aerobic/resistive exercise and nutrition plan for blood glucose control.;Long Term: Attainment of HbA1C < 7%.    Heart Failure Yes    Intervention Provide a combined exercise and nutrition program that is supplemented with education, support and counseling about heart failure. Directed toward relieving symptoms such as shortness of breath, decreased exercise tolerance, and extremity edema.    Expected Outcomes Short term: Attendance in program 2-3 days a week with increased exercise capacity. Reported lower sodium intake. Reported increased fruit and vegetable intake. Reports medication compliance.;Short term: Daily weights obtained and reported for increase. Utilizing diuretic protocols set by physician.;Long term: Adoption of self-care skills and reduction of barriers for early signs and symptoms recognition and intervention leading to self-care maintenance.    Stress Yes    Intervention Offer individual and/or small group education and counseling on adjustment to heart disease, stress management and  health-related lifestyle change. Teach and support self-help strategies.;Refer participants experiencing significant psychosocial distress to appropriate mental health specialists for further evaluation and treatment. When possible, include family members and significant others in education/counseling sessions.    Expected Outcomes Short Term: Participant demonstrates changes in health-related behavior, relaxation and other stress management skills, ability to obtain effective social support, and compliance with psychotropic medications if prescribed.;Long Term: Emotional wellbeing is indicated by absence of clinically significant psychosocial distress or social isolation.            Personal Goals Discharge:  Goals and Risk Factor Review    Row Name 02/28/20 1012 02/28/20 1013 03/06/20 1128 03/29/20 1232 05/09/20 0946     Core Components/Risk Factors/Patient Goals Review   Personal Goals Review Weight Management/Obesity;Heart Failure;Stress Weight Management/Obesity;Heart Failure;Stress;Diabetes Weight Management/Obesity;Heart Failure;Stress;Diabetes Weight Management/Obesity;Heart Failure;Stress;Diabetes Weight Management/Obesity;Heart Failure;Stress;Diabetes   Review -- Alicia Wood did well on her first day of exercise. Alicia Wood's vital signs were stable. Alicia Wood is doing well with exercise at cardiac rehab. Alicia Wood's vital signs have been stable Alicia Wood is doing well with exercise at cardiac rehab. Alicia Wood's vital signs have been stable. Alicia Wood has had some problems with her knee and has been using the treadmill instead of the nustep Alicia Wood completed exercise on 04/20/20. Alicia Wood did well with exercise. Alicia Wood's vital signs were stable   Expected Outcomes -- Alicia Wood will continue to participate in phase 2 cardiac rehab for exercise, nutrition and lifestyle modifications Alicia Wood will continue to participate in phase 2 cardiac rehab for exercise, nutrition and lifestyle modifications Alicia Wood will continue to participate in phase 2 cardiac rehab for  exercise, nutrition and lifestyle modifications Alicia Wood will continue exercise follow nutrtion and lifestyle modifications upon completion of phase 2 cardiac rehab  Exercise Goals and Review:  Exercise Goals    Row Name 02/23/20 1420             Exercise Goals   Increase Physical Activity Yes       Intervention Provide advice, education, support and counseling about physical activity/exercise needs.;Develop an individualized exercise prescription for aerobic and resistive training based on initial evaluation findings, risk stratification, comorbidities and participant's personal goals.       Expected Outcomes Short Term: Attend rehab on a regular basis to increase amount of physical activity.;Long Term: Add in home exercise to make exercise part of routine and to increase amount of physical activity.;Long Term: Exercising regularly at least 3-5 days a week.       Increase Strength and Stamina Yes       Intervention Provide advice, education, support and counseling about physical activity/exercise needs.;Develop an individualized exercise prescription for aerobic and resistive training based on initial evaluation findings, risk stratification, comorbidities and participant's personal goals.       Expected Outcomes Short Term: Increase workloads from initial exercise prescription for resistance, speed, and METs.;Short Term: Perform resistance training exercises routinely during rehab and add in resistance training at home;Long Term: Improve cardiorespiratory fitness, muscular endurance and strength as measured by increased METs and functional capacity (6MWT)       Able to understand and use rate of perceived exertion (RPE) scale Yes       Intervention Provide education and explanation on how to use RPE scale       Expected Outcomes Short Term: Able to use RPE daily in rehab to express subjective intensity level;Long Term:  Able to use RPE to guide intensity level when exercising  independently       Knowledge and understanding of Target Heart Rate Range (THRR) Yes       Intervention Provide education and explanation of THRR including how the numbers were predicted and where they are located for reference       Expected Outcomes Short Term: Able to state/look up THRR;Long Term: Able to use THRR to govern intensity when exercising independently;Short Term: Able to use daily as guideline for intensity in rehab       Understanding of Exercise Prescription Yes       Intervention Provide education, explanation, and written materials on patient's individual exercise prescription       Expected Outcomes Short Term: Able to explain program exercise prescription;Long Term: Able to explain home exercise prescription to exercise independently              Exercise Goals Re-Evaluation:  Exercise Goals Re-Evaluation    Row Name 02/27/20 1254 04/02/20 1030 04/20/20 1017         Exercise Goal Re-Evaluation   Exercise Goals Review Increase Physical Activity;Increase Strength and Stamina;Able to understand and use rate of perceived exertion (RPE) scale;Knowledge and understanding of Target Heart Rate Range (THRR);Understanding of Exercise Prescription Increase Physical Activity;Increase Strength and Stamina;Able to understand and use rate of perceived exertion (RPE) scale;Knowledge and understanding of Target Heart Rate Range (THRR);Able to check pulse independently;Understanding of Exercise Prescription Increase Physical Activity;Increase Strength and Stamina;Able to understand and use rate of perceived exertion (RPE) scale;Knowledge and understanding of Target Heart Rate Range (THRR);Able to check pulse independently;Understanding of Exercise Prescription     Comments Pt's first day of exercise in the [facility] program. Pt tolerated  the session well and understands the exercise Rx, THRR, and RPE scale. Reviewed Goals. Pt has made good progress  in the [facility] program, She is motivated and  demonstates desire to improve CV fitness. Pt graduated from the [facility] program today. Pt made good progress and had an average MET level of 3.2. Pt plans to continue to exrcise by walking and going to a local gym and working with a trainer.     Expected Outcomes Will continue to monitor patient and progress exercise workloads as tolerated. Will continue to monitor patient and progress exercise workloads as tolerated. Pt will continue to exercise at home on her own 5-7x/week for 30-45 minutes.            Nutrition & Weight - Outcomes:  Pre Biometrics - 02/23/20 0930      Pre Biometrics   Waist Circumference 54 inches    Hip Circumference 59.25 inches    Waist to Hip Ratio 0.91 %    Triceps Skinfold 55 mm    % Body Fat 57.1 %    Grip Strength 44 kg    Flexibility 11.75 in    Single Leg Stand 9.31 seconds   Moderate fall risk          Post Biometrics - 04/18/20 1025       Post  Biometrics   Height 5' 7.5" (1.715 m)    Weight 132.8 kg    Waist Circumference 54 inches    Hip Circumference 59 inches    Waist to Hip Ratio 0.92 %    BMI (Calculated) 45.15    Triceps Skinfold 53 mm    % Body Fat 56.6 %    Grip Strength 51 kg    Flexibility 10.5 in    Single Leg Stand 19.75 seconds           Nutrition:  Nutrition Therapy & Goals - 03/02/20 1205      Nutrition Therapy   Diet TLC; weight loss      Personal Nutrition Goals   Nutrition Goal Pt to identify food quantities necessary to achieve weight loss of 6-24 lb at graduation from cardiac rehab.    Personal Goal #2 Pt to build a healthy plate including vegetables, fruits, whole grains, and low-fat dairy products in a heart healthy meal plan.    Personal Goal #3 Pt to learn to meal prep    Personal Goal #4 Pt to reduce eating out to <2 times per week      Intervention Plan   Intervention Prescribe, educate and counsel regarding individualized specific dietary modifications aiming towards targeted core components such as weight,  hypertension, lipid management, diabetes, heart failure and other comorbidities.    Expected Outcomes Short Term Goal: A plan has been developed with personal nutrition goals set during dietitian appointment.;Long Term Goal: Adherence to prescribed nutrition plan.           Nutrition Discharge:   Education Questionnaire Score:  Knowledge Questionnaire Score - 04/20/20 1011      Knowledge Questionnaire Score   Post Score 22/24           Goals reviewed with patient; copy given to patient.Alicia Wood graduated from cardiac rehab program on 04/20/20 with completion of 18 exercise sessions in Phase II. Pt maintained good attendance and progressed nicely during his participation in rehab as evidenced by increased MET level.   Medication list reconciled. Repeat  PHQ score-  0.  Pt has made significant lifestyle changes and should be commended for her success. Pt feelsshe has achieved her goals during cardiac rehab.   Pt plans to continue exercise  by walking and going to the gym. Alicia Wood increased her distance on her post exercise walk test by 298 feet. We are proud of Alicia Wood's progress!Barnet Pall, RN,BSN 05/09/2020 9:55 AM

## 2020-06-04 DIAGNOSIS — E559 Vitamin D deficiency, unspecified: Secondary | ICD-10-CM | POA: Diagnosis not present

## 2020-06-05 DIAGNOSIS — Z8679 Personal history of other diseases of the circulatory system: Secondary | ICD-10-CM | POA: Diagnosis not present

## 2020-06-05 DIAGNOSIS — M25562 Pain in left knee: Secondary | ICD-10-CM | POA: Diagnosis not present

## 2020-06-05 DIAGNOSIS — E1165 Type 2 diabetes mellitus with hyperglycemia: Secondary | ICD-10-CM | POA: Diagnosis not present

## 2020-06-05 DIAGNOSIS — M25462 Effusion, left knee: Secondary | ICD-10-CM | POA: Diagnosis not present

## 2020-06-18 DIAGNOSIS — M1990 Unspecified osteoarthritis, unspecified site: Secondary | ICD-10-CM | POA: Diagnosis not present

## 2020-06-18 DIAGNOSIS — M25561 Pain in right knee: Secondary | ICD-10-CM | POA: Diagnosis not present

## 2020-06-18 DIAGNOSIS — M1180 Other specified crystal arthropathies, unspecified site: Secondary | ICD-10-CM | POA: Diagnosis not present

## 2020-07-04 ENCOUNTER — Other Ambulatory Visit (HOSPITAL_COMMUNITY): Payer: Self-pay

## 2020-07-04 DIAGNOSIS — E1165 Type 2 diabetes mellitus with hyperglycemia: Secondary | ICD-10-CM | POA: Diagnosis not present

## 2020-07-04 DIAGNOSIS — F3341 Major depressive disorder, recurrent, in partial remission: Secondary | ICD-10-CM | POA: Diagnosis not present

## 2020-07-04 DIAGNOSIS — M170 Bilateral primary osteoarthritis of knee: Secondary | ICD-10-CM | POA: Diagnosis not present

## 2020-07-04 MED ORDER — FLUOXETINE HCL 10 MG PO CAPS
10.0000 mg | ORAL_CAPSULE | Freq: Every day | ORAL | 3 refills | Status: DC
Start: 2020-07-04 — End: 2021-03-04
  Filled 2020-07-04: qty 90, 90d supply, fill #0

## 2020-07-05 ENCOUNTER — Other Ambulatory Visit (HOSPITAL_COMMUNITY): Payer: Self-pay

## 2020-07-05 MED ORDER — OZEMPIC (0.25 OR 0.5 MG/DOSE) 2 MG/1.5ML ~~LOC~~ SOPN
PEN_INJECTOR | SUBCUTANEOUS | 2 refills | Status: DC
Start: 2020-07-04 — End: 2021-03-04
  Filled 2020-07-05: qty 1.5, 28d supply, fill #0

## 2020-07-06 ENCOUNTER — Other Ambulatory Visit (HOSPITAL_COMMUNITY): Payer: Self-pay

## 2020-08-07 ENCOUNTER — Other Ambulatory Visit (HOSPITAL_COMMUNITY): Payer: Self-pay

## 2020-08-07 DIAGNOSIS — E1165 Type 2 diabetes mellitus with hyperglycemia: Secondary | ICD-10-CM | POA: Diagnosis not present

## 2020-08-07 DIAGNOSIS — F3341 Major depressive disorder, recurrent, in partial remission: Secondary | ICD-10-CM | POA: Diagnosis not present

## 2020-08-07 MED ORDER — OZEMPIC (0.25 OR 0.5 MG/DOSE) 2 MG/1.5ML ~~LOC~~ SOPN
PEN_INJECTOR | SUBCUTANEOUS | 2 refills | Status: DC
Start: 2020-08-07 — End: 2021-03-04

## 2020-08-07 MED ORDER — FLUOXETINE HCL 20 MG PO CAPS
ORAL_CAPSULE | ORAL | 3 refills | Status: DC
Start: 2020-08-07 — End: 2023-09-15
  Filled 2020-08-07: qty 90, 90d supply, fill #0

## 2020-08-07 MED ORDER — OZEMPIC (1 MG/DOSE) 4 MG/3ML ~~LOC~~ SOPN
PEN_INJECTOR | SUBCUTANEOUS | 1 refills | Status: DC
Start: 2020-08-07 — End: 2021-03-04
  Filled 2020-08-07: qty 3, 28d supply, fill #0
  Filled 2020-09-10: qty 3, 28d supply, fill #1

## 2020-08-16 ENCOUNTER — Other Ambulatory Visit (HOSPITAL_COMMUNITY): Payer: Self-pay

## 2020-08-16 MED ORDER — AMOXICILLIN 500 MG PO CAPS
500.0000 mg | ORAL_CAPSULE | Freq: Three times a day (TID) | ORAL | 0 refills | Status: DC
  Filled 2020-08-16: qty 21, 7d supply, fill #0

## 2020-09-01 ENCOUNTER — Emergency Department (HOSPITAL_COMMUNITY): Payer: 59

## 2020-09-01 ENCOUNTER — Encounter (HOSPITAL_COMMUNITY): Payer: Self-pay | Admitting: Emergency Medicine

## 2020-09-01 ENCOUNTER — Emergency Department (HOSPITAL_COMMUNITY)
Admission: EM | Admit: 2020-09-01 | Discharge: 2020-09-01 | Disposition: A | Discharge status: Home / Self Care | Payer: 59 | Attending: Emergency Medicine | Admitting: Emergency Medicine

## 2020-09-01 DIAGNOSIS — I5032 Chronic diastolic (congestive) heart failure: Secondary | ICD-10-CM | POA: Insufficient documentation

## 2020-09-01 DIAGNOSIS — M25539 Pain in unspecified wrist: Secondary | ICD-10-CM

## 2020-09-01 DIAGNOSIS — G5601 Carpal tunnel syndrome, right upper limb: Secondary | ICD-10-CM

## 2020-09-01 DIAGNOSIS — I110 Hypertensive heart disease with heart failure: Secondary | ICD-10-CM | POA: Diagnosis not present

## 2020-09-01 DIAGNOSIS — M25531 Pain in right wrist: Secondary | ICD-10-CM | POA: Insufficient documentation

## 2020-09-01 DIAGNOSIS — M79641 Pain in right hand: Secondary | ICD-10-CM | POA: Diagnosis not present

## 2020-09-01 MED ORDER — MELOXICAM 7.5 MG PO TABS
7.5000 mg | ORAL_TABLET | Freq: Every day | ORAL | 0 refills | Status: DC
  Filled 2020-09-01: qty 10, 10d supply, fill #0

## 2020-09-01 NOTE — Progress Notes (Signed)
Orthopedic Tech Progress Note Patient Details:  Alicia Wood [birth year] [id number]  Ortho Devices Type of Ortho Device: Velcro wrist forearm splint Ortho Device/Splint Location: RUE Ortho Device/Splint Interventions: Ordered, Application   Post Interventions Patient Tolerated: Well Instructions Provided: Adjustment of device  [name] 09/01/2020, 5:57 PM

## 2020-09-01 NOTE — Discharge Instructions (Addendum)
You were seen today in the emergency department for right wrist pain.  Based on your exam, I think you have carpal tunnel syndrome.  to the left wrist.  I have included some reading material for carpal tunnel syndrome.  Based on your bad experience with prednisone in the past with your diabetes, I do not think you need steroids at this time. Instead, I would like to take meloxicam twice daily for the next 5 days.  Please take it with food and water. This can increase risk of gastic ulcers, so please d/c taking it if you have abdominal pain. I will have you wear a night brace.  Please wear this night brace daily, including at night when you are resting sleep.  I would also like you to follow-up with your primary doctor in a week for further evaluation.  If your condition worsens please return back to the ED for further evaluation.

## 2020-09-01 NOTE — ED Provider Notes (Signed)
[facility]-EMERGENCY DEPT Provider Note   CSN: [id number] Arrival date & time: 09/01/20  1633     History Chief Complaint  Patient presents with   Wrist Pain    Alicia Wood is a 50 y.o. female.  HPI  Patient presents with right wrist pain x1 day.  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  Yesterday after work she was walking up the stairs using the handrail, she noticed to pain to the wrist at that time.  Denies any mechanical injury to the wrist.  Says the pain has been constant, but is worse at night and throughout the day after a lot of use.  Describes the pain as a numbing, tingling pain.  She is diabetic, but does not have a history of gout.  No history of blood clots.  No fevers, night sweats, nausea, vomiting, chest pain, shortness of breath.  No prior surgeries to the wrist.  Past Medical History:  Diagnosis Date   Back pain    Chest pain    a. Initially in setting of pericarditis in 11/2015; b. Ongoing cp despite prednisone, colchicine, ibuprofen,and narcotic therapy;  c. 12/2015 CTA Chest: No PE, stable small-mod pericardial effusion;  d. 12/2015 Cath: nl cors.   Chest pain    Chronic diastolic CHF (congestive heart failure) (HCC)    a. 11/2015 Echo: EF 55-60%, grade 2 diastolic dysfxn.   Depression    Edema of both lower extremities    GERD (gastroesophageal reflux disease)    Headache    Hyperlipidemia    Hypertension    Patient states she does not have hypertension.    Osteoarthritis    Pericardial effusion    a. 11/23/2015 Echo: mod pericardial effusion, no tamponade; b. 12/05/2015 Echo: small circumferential pericardial effusion; c. 12/06/2015 Cardiac MRI: pericardial thickening w/ circumferential late gad enhancement of pericardium and epicardium-->suspicious for subacute effusive constrictive pericarditis;  d. 12/19/2015 Echo: triv pericardial effusion;  e. 01/06/2016 CT Chest: small to mod pericard eff.   Pericarditis     a. 11/2015 Cardiac MRI; EF 57%, mild MR, triv TR, mild pericardial effusion, thickening of pericardium focally up to 65mm w/ significant circumferential late gad enhancement of pericardium and epicardium sugg of subacute effusive constrictive pericarditis;  b. 12/2015 R Heart Cath: nl right heart pressures w/o evidence of constrictive physiology.   Torn meniscus     Patient Active Problem List   Diagnosis Date Noted   Hypertension 01/18/2020   Morbid obesity (HCC) 06/22/2019   Prediabetes 06/09/2019   Vitamin D deficiency 06/09/2019   Epigastric abdominal tenderness 01/30/2016   Dysphagia 01/30/2016   GERD (gastroesophageal reflux disease) 01/30/2016   Pericarditis 12/17/2015   Constrictive pericarditis    Chronic diastolic CHF (congestive heart failure) (HCC)    Pulmonary hypertension (HCC)    Precordial pain 12/05/2015   Hypotension 12/05/2015   Arterial hypotension    Pericardial effusion 11/22/2015   Right-sided low back pain without sciatica 06/16/2014    Past Surgical History:  Procedure Laterality Date   ABDOMINAL HYSTERECTOMY     APPENDECTOMY     CARDIAC CATHETERIZATION N/A 01/21/2016   Procedure: Right/Left Heart Cath and Coronary Angiography;  Surgeon: Kathleene Hazel, MD;  Location: [facility] INVASIVE CV LAB;  Service: Cardiovascular;  Laterality: N/A;   ESOPHAGOGASTRODUODENOSCOPY (EGD) WITH PROPOFOL N/A 01/30/2016   Procedure: ESOPHAGOGASTRODUODENOSCOPY (EGD) WITH PROPOFOL;  Surgeon: [name], MD;  Location: [facility] ENDOSCOPY;  Service: Endoscopy;  Laterality: N/A;   PARTIAL HYSTERECTOMY  TUBAL LIGATION       OB History     Gravida  3   Para  3   Term      Preterm      AB      Living         SAB      IAB      Ectopic      Multiple      Live Births              Family History  Problem Relation Age of Onset   Hypertension Mother    Cancer Mother    Depression Mother    Healthy Father     Social History   Tobacco Use    Smoking status: Never   Smokeless tobacco: Never  Vaping Use   Vaping Use: Never used  Substance Use Topics   Alcohol use: Yes    Comment: occasional   Drug use: No    Home Medications Prior to Admission medications   Medication Sig Start Date End Date Taking? Authorizing Provider  acetaminophen (TYLENOL) 650 MG CR tablet Take 650 mg by mouth every 8 (eight) hours as needed for pain. Yesterday she took 1950 mg ( 3 tablets) ay 6 pm.    [provider]  albuterol (VENTOLIN HFA) 108 (90 Base) MCG/ACT inhaler Inhale 1 puff into the lungs every 4 (four) hours as needed for wheezing or shortness of breath. Patient not taking: Reported on 04/20/2020    [provider]  amoxicillin (AMOXIL) 500 MG capsule Take 1 capsule (500 mg total) by mouth 3 (three) times daily for 7 days 08/16/20     Colchicine 0.6 MG CAPS TAKE 1 CAPSULE BY MOUTH TWO TIMES DAILY FOR 14 DAYS 12/26/19 12/25/20  Koirala, Dibas, MD  FLUoxetine (PROZAC) 10 MG capsule Take 1 capsule (10 mg total) by mouth daily. 07/04/20     FLUoxetine (PROZAC) 20 MG capsule Take 1 capsule by mouth once daily 08/07/20     predniSONE (DELTASONE) 20 MG tablet TAKE 3 TABLETS DAILY FOR 3 DAYS, 2 TABS DAILY FOR 3 DAYS THEN 1 TABLET DAILY FOR 4 DAYS WITH FOOD OR MILK 12/26/19 12/25/20  Koirala, Dibas, MD  Semaglutide, 1 MG/DOSE, (OZEMPIC, 1 MG/DOSE,) 4 MG/3ML SOPN Inject 1 mg under the skin once weekly 08/07/20     Semaglutide,0.25 or 0.5MG /DOS, (OZEMPIC, 0.25 OR 0.5 MG/DOSE,) 2 MG/1.5ML SOPN Inject 0.25 mg once a week for 4 weeks then increase to 0.5 mg weekly 07/04/20     Semaglutide,0.25 or 0.5MG /DOS, (OZEMPIC, 0.25 OR 0.5 MG/DOSE,) 2 MG/1.5ML SOPN Inject 1 mg subcutaneous once a week 08/07/20       Allergies    Patient has no known allergies.  Review of Systems   Review of Systems  Constitutional:  Negative for fever.  Musculoskeletal:        Wrist pain   Physical Exam Updated Vital Signs BP 135/85   Pulse 66   Temp 98.9 F (37.2  C)   Resp 18   SpO2 97%   Physical Exam Vitals and nursing note reviewed. Exam conducted with a chaperone present.  Constitutional:      General: She is not in acute distress.    Appearance: Normal appearance.  HENT:     Head: Normocephalic and atraumatic.  Eyes:     General: No scleral icterus.    Extraocular Movements: Extraocular movements intact.     Pupils: Pupils are equal, round, and reactive to light.  Cardiovascular:     Comments: Radial pulses 2+ bilaterally. Musculoskeletal:        General: Tenderness present. No swelling, deformity or signs of injury. Normal range of motion.     Comments: Patient has a positive Finkelstein, negative Tinel test.  Has some pain with flexion extension of the wrist.  She is full range of motion to her fingers.  There is some point tenderness over the scaphoid bone.  No crepitus  Skin:    Coloration: Skin is not jaundiced.  Neurological:     General: No focal deficit present.     Mental Status: She is alert. Mental status is at baseline.     Motor: No weakness.     Coordination: Coordination normal.     Comments: Sensation is equal bilaterally.    ED Results / Procedures / Treatments   Labs (all labs ordered are listed, but only abnormal results are displayed) Labs Reviewed - No data to display  EKG None  Radiology No results found.  Procedures Procedures   Medications Ordered in ED Medications - No data to display  ED Course  I have reviewed the triage vital signs and the nursing notes.  Pertinent labs & imaging results that were available during my care of the patient were reviewed by me and considered in my medical decision making (see chart for details).    MDM Rules/Calculators/A&P                         Patient is a 50 year old female presenting with wrist pain.  Vitals are stable, nontoxic-appearing.  Description of pain plus neuro exam is suspicious for nervous pain.  I have a high suspicion this is carpal  tunnel.  Low suspicion for fracture given the mechanism of injury, however, I will order x-rays due to bony tenderness over the scaphoid bone.  I do not believe this is septic joint given lack of systemic symptoms, lack of fever, stable vitals, no swelling.  Not suspicious for compartment syndrome given physical exam, age, lack of mechanism.  Gout is also on the differential, but this would be an unusual presentation.  There is also no warmth to touch.  No associated dietary trigger.  Patient is diabetic with a history of pericarditis.  She was on steroids long-term and reports having issues with her blood sugar control while on them.   I discussed the benefits versus risk of using prednisone with the patient and she and I engaged in shared decision making. For this reason I will not be giving prednisone.   Final Clinical Impression(s) / ED Diagnoses Final diagnoses:  None    Rx / DC Orders ED Discharge Orders     None        Theron Arista, Cordelia Poche 09/02/20 1604    Tilden Fossa, MD 09/03/20 [phone]

## 2020-09-01 NOTE — ED Triage Notes (Signed)
Patient c/o right wrist pain x2 days. Denies injury. Pain worsens with movement. Swelling present.

## 2020-09-01 NOTE — ED Notes (Signed)
Ortho tech called for wrist brace  

## 2020-09-03 ENCOUNTER — Other Ambulatory Visit (HOSPITAL_COMMUNITY): Payer: Self-pay

## 2020-09-10 ENCOUNTER — Other Ambulatory Visit (HOSPITAL_COMMUNITY): Payer: Self-pay

## 2020-10-09 ENCOUNTER — Other Ambulatory Visit (HOSPITAL_COMMUNITY): Payer: Self-pay

## 2020-10-09 DIAGNOSIS — M25512 Pain in left shoulder: Secondary | ICD-10-CM | POA: Diagnosis not present

## 2020-10-09 MED ORDER — MELOXICAM 7.5 MG PO TABS
ORAL_TABLET | ORAL | 0 refills | Status: DC
Start: 2020-10-09 — End: 2021-03-04
  Filled 2020-10-09: qty 14, 14d supply, fill #0

## 2020-10-11 ENCOUNTER — Other Ambulatory Visit (HOSPITAL_COMMUNITY): Payer: Self-pay

## 2020-10-12 DIAGNOSIS — M25512 Pain in left shoulder: Secondary | ICD-10-CM | POA: Diagnosis not present

## 2020-10-12 DIAGNOSIS — M542 Cervicalgia: Secondary | ICD-10-CM | POA: Diagnosis not present

## 2020-12-25 DIAGNOSIS — Z23 Encounter for immunization: Secondary | ICD-10-CM | POA: Diagnosis not present

## 2020-12-25 DIAGNOSIS — F3341 Major depressive disorder, recurrent, in partial remission: Secondary | ICD-10-CM | POA: Diagnosis not present

## 2020-12-25 DIAGNOSIS — E785 Hyperlipidemia, unspecified: Secondary | ICD-10-CM | POA: Diagnosis not present

## 2020-12-25 DIAGNOSIS — M25512 Pain in left shoulder: Secondary | ICD-10-CM | POA: Diagnosis not present

## 2020-12-25 DIAGNOSIS — E1165 Type 2 diabetes mellitus with hyperglycemia: Secondary | ICD-10-CM | POA: Diagnosis not present

## 2020-12-27 ENCOUNTER — Other Ambulatory Visit (HOSPITAL_COMMUNITY): Payer: Self-pay

## 2020-12-27 MED ORDER — ROSUVASTATIN CALCIUM 10 MG PO TABS
ORAL_TABLET | ORAL | 3 refills | Status: DC
Start: 2020-12-27 — End: 2021-03-04
  Filled 2020-12-27: qty 90, 90d supply, fill #0

## 2020-12-28 ENCOUNTER — Other Ambulatory Visit (HOSPITAL_COMMUNITY): Payer: Self-pay

## 2020-12-28 DIAGNOSIS — M25512 Pain in left shoulder: Secondary | ICD-10-CM | POA: Diagnosis not present

## 2021-01-17 DIAGNOSIS — M25512 Pain in left shoulder: Secondary | ICD-10-CM | POA: Diagnosis not present

## 2021-01-28 ENCOUNTER — Other Ambulatory Visit (HOSPITAL_COMMUNITY): Payer: Self-pay

## 2021-01-28 DIAGNOSIS — M25512 Pain in left shoulder: Secondary | ICD-10-CM | POA: Diagnosis not present

## 2021-01-28 MED ORDER — MELOXICAM 15 MG PO TABS
ORAL_TABLET | ORAL | 2 refills | Status: DC
Start: 2021-01-28 — End: 2021-03-04
  Filled 2021-01-28: qty 30, 30d supply, fill #0

## 2021-02-05 ENCOUNTER — Ambulatory Visit: Payer: 59 | Attending: Orthopedic Surgery | Admitting: Physical Therapy

## 2021-02-11 ENCOUNTER — Other Ambulatory Visit (HOSPITAL_COMMUNITY): Payer: Self-pay | Admitting: Surgery

## 2021-02-11 ENCOUNTER — Other Ambulatory Visit: Payer: Self-pay | Admitting: Surgery

## 2021-02-15 DIAGNOSIS — F5089 Other specified eating disorder: Secondary | ICD-10-CM | POA: Diagnosis not present

## 2021-02-21 ENCOUNTER — Ambulatory Visit (HOSPITAL_COMMUNITY)
Admission: RE | Admit: 2021-02-21 | Discharge: 2021-02-21 | Disposition: A | Discharge status: Home / Self Care | Payer: 59 | Source: Ambulatory Visit | Attending: Surgery | Admitting: Surgery

## 2021-02-21 ENCOUNTER — Encounter: Payer: Self-pay | Admitting: Cardiology

## 2021-02-21 ENCOUNTER — Other Ambulatory Visit: Payer: Self-pay

## 2021-02-21 DIAGNOSIS — Z01818 Encounter for other preprocedural examination: Secondary | ICD-10-CM | POA: Diagnosis not present

## 2021-02-28 ENCOUNTER — Encounter: Payer: Self-pay | Admitting: Cardiology

## 2021-03-03 NOTE — Progress Notes (Signed)
Cardiology Office Note   Date:  03/04/2021   ID:  Alicia Wood, DOB 02-May-1970, MRN [id number]  PCP:  Glenis Smoker, MD  Cardiologist:   Minus Breeding, MD Referring:  Glenis Smoker, MD    Chief Complaint  Patient presents with   Bariatric Pre-op       History of Present Illness: Alicia Wood is a 50 y.o. female who was referred by Glenis Smoker, MD for evaluation of chest pain.   She was in the ED in late Sept for this.  Trop was negative and there were no acute changes on echo.   She did have pericarditis in 2017.  I saw her last over 3 years ago for the first time.  She was having some chest discomfort late last year.  She had some trace pericardial effusion.  She was managed for recurrent pericarditis.  Since then she has done well.  The patient denies any new symptoms such as chest discomfort, neck or arm discomfort. There has been no new shortness of breath, PND or orthopnea. There have been no reported palpitations, presyncope or syncope.   She is going to have potential gastric sleeve surgery.     Past Medical History:  Diagnosis Date   Back pain    Chest pain    a. Initially in setting of pericarditis in 11/2015; b. Ongoing cp despite prednisone, colchicine, ibuprofen,and narcotic therapy;  c. 12/2015 CTA Chest: No PE, stable small-mod pericardial effusion;  d. 12/2015 Cath: nl cors.   Chest pain    Chronic diastolic CHF (congestive heart failure) ([place])    a. 11/2015 Echo: EF 0000000, grade 2 diastolic dysfxn.   Depression    Edema of both lower extremities    GERD (gastroesophageal reflux disease)    Headache    Hyperlipidemia    Hypertension    Patient states she does not have hypertension.    Osteoarthritis    Pericardial effusion    a. 11/23/2015 Echo: mod pericardial effusion, no tamponade; b. 12/05/2015 Echo: small circumferential pericardial effusion; c. 12/06/2015 Cardiac MRI: pericardial thickening w/ circumferential late gad  enhancement of pericardium and epicardium-->suspicious for subacute effusive constrictive pericarditis;  d. 12/19/2015 Echo: triv pericardial effusion;  e. 01/06/2016 CT Chest: small to mod pericard eff.   Pericarditis    a. 11/2015 Cardiac MRI; EF 57%, mild MR, triv TR, mild pericardial effusion, thickening of pericardium focally up to 83mm w/ significant circumferential late gad enhancement of pericardium and epicardium sugg of subacute effusive constrictive pericarditis;  b. 12/2015 R Heart Cath: nl right heart pressures w/o evidence of constrictive physiology.   Torn meniscus     Past Surgical History:  Procedure Laterality Date   ABDOMINAL HYSTERECTOMY     APPENDECTOMY     CARDIAC CATHETERIZATION N/A 01/21/2016   Procedure: Right/Left Heart Cath and Coronary Angiography;  Surgeon: Burnell Blanks, MD;  Location: [place] CV LAB;  Service: Cardiovascular;  Laterality: N/A;   ESOPHAGOGASTRODUODENOSCOPY (EGD) WITH PROPOFOL N/A 01/30/2016   Procedure: ESOPHAGOGASTRODUODENOSCOPY (EGD) WITH PROPOFOL;  Surgeon: Wilford Corner, MD;  Location: [facility] ENDOSCOPY;  Service: Endoscopy;  Laterality: N/A;   PARTIAL HYSTERECTOMY     TUBAL LIGATION       Current Outpatient Medications  Medication Sig Dispense Refill   FLUoxetine (PROZAC) 20 MG capsule Take 1 capsule by mouth once daily 90 capsule 3   acetaminophen (TYLENOL) 650 MG CR tablet Take 650 mg by mouth every 8 (eight) hours as needed for pain.  Yesterday she took 1950 mg ( 3 tablets) ay 6 pm. (Patient not taking: Reported on 03/04/2021)     albuterol (VENTOLIN HFA) 108 (90 Base) MCG/ACT inhaler Inhale 1 puff into the lungs every 4 (four) hours as needed for wheezing or shortness of breath. (Patient not taking: Reported on 03/04/2021)     amoxicillin (AMOXIL) 500 MG capsule Take 1 capsule (500 mg total) by mouth 3 (three) times daily for 7 days (Patient not taking: Reported on 03/04/2021) 21 capsule 0   Colchicine 0.6 MG CAPS TAKE 1 CAPSULE  BY MOUTH TWO TIMES DAILY FOR 14 DAYS 28 capsule 0   FLUoxetine (PROZAC) 10 MG capsule Take 1 capsule (10 mg total) by mouth daily. (Patient not taking: Reported on 03/04/2021) 90 capsule 3   meloxicam (MOBIC) 15 MG tablet Take 1 tablet by mouth once a day as needed for pain (Patient not taking: Reported on 03/04/2021) 30 tablet 2   meloxicam (MOBIC) 7.5 MG tablet Take 1 tablet by mouth once daily for 14 days as directed (Patient not taking: Reported on 03/04/2021) 14 tablet 0   Semaglutide, 1 MG/DOSE, (OZEMPIC, 1 MG/DOSE,) 4 MG/3ML SOPN Inject 1 mg under the skin once weekly (Patient not taking: Reported on 03/04/2021) 3 mL 1   Semaglutide,0.25 or 0.5MG /DOS, (OZEMPIC, 0.25 OR 0.5 MG/DOSE,) 2 MG/1.5ML SOPN Inject 0.25 mg once a week for 4 weeks then increase to 0.5 mg weekly (Patient not taking: Reported on 03/04/2021) 3 mL 2   Semaglutide,0.25 or 0.5MG /DOS, (OZEMPIC, 0.25 OR 0.5 MG/DOSE,) 2 MG/1.5ML SOPN Inject 1 mg subcutaneous once a week (Patient not taking: Reported on 03/04/2021) 1 mL 2   No current facility-administered medications for this visit.    Allergies:   Patient has no known allergies.    ROS:  Please see the history of present illness.   Otherwise, review of systems are positive for none.   All other systems are reviewed and negative.    PHYSICAL EXAM: VS:  BP 135/78   Pulse 60   Ht 5\' 8"  (1.727 m)   Wt 297 lb 12.8 oz (135.1 kg)   SpO2 97%   BMI 45.28 kg/m  , BMI Body mass index is 45.28 kg/m. GENERAL:  Well appearing NECK:  No jugular venous distention, waveform within normal limits, carotid upstroke brisk and symmetric, no bruits, no thyromegaly LUNGS:  Clear to auscultation bilaterally CHEST:  Unremarkable HEART:  PMI not displaced or sustained,S1 and S2 within normal limits, no S3, no S4, no clicks, no rubs, no murmurs ABD:  Flat, positive bowel sounds normal in frequency in pitch, no bruits, no rebound, no guarding, no midline pulsatile mass, no hepatomegaly, no  splenomegaly EXT:  2 plus pulses throughout, no edema, no cyanosis no clubbing    EKG:  EKG is not ordered today. The ekg ordered d12/1/22 emonstrates normal sinus rhythm, rate 70 axis within normal limits, intervals within normal limits, no acute ST-T wave changes.   Recent Labs: No results found for requested labs within last 8760 hours.    Lipid Panel No results found for: CHOL, TRIG, HDL, CHOLHDL, VLDL, LDLCALC, LDLDIRECT    Wt Readings from Last 3 Encounters:  03/04/21 297 lb 12.8 oz (135.1 kg)  04/18/20 292 lb 12.3 oz (132.8 kg)  03/30/20 292 lb (132.5 kg)      Other studies Reviewed: Additional studies/ records that were reviewed today include: Labs Review of the above records demonstrates:  Please see elsewhere in the note.  ASSESSMENT AND PLAN:  CHEST PAIN:   The patient's had no recurrent chest pain and no evidence of pericarditis.  No change in therapy.  HISTORY OF PERICARDITIS:   No change in therapy.  No further imaging.   PREOP: The patient is at acceptable risk for planned bariatric surgery.  No further testing is indicated.  There are no high risk findings.  She has a very active job and no symptoms.  HTN: Her blood pressure is controlled.  No change in therapy.  CHRONIC DIASTOLIC DYSFUNCTION:   She is euvolemic.  No change in therapy.    Current medicines are reviewed at length with the patient today.  The patient does not have concerns regarding medicines.  The following changes have been made:   Labs/ tests ordered today include:    No orders of the defined types were placed in this encounter.    Disposition:   FU with me in one year.    Signed, Rollene Rotunda, MD  03/04/2021 9:03 AM    [facility]

## 2021-03-04 ENCOUNTER — Ambulatory Visit: Payer: 59 | Admitting: Cardiology

## 2021-03-04 ENCOUNTER — Other Ambulatory Visit: Payer: Self-pay

## 2021-03-04 ENCOUNTER — Encounter: Payer: Self-pay | Admitting: Cardiology

## 2021-03-04 ENCOUNTER — Other Ambulatory Visit (HOSPITAL_COMMUNITY): Payer: Self-pay

## 2021-03-04 VITALS — BP 135/78 | HR 60 | Ht 68.0 in | Wt 297.8 lb

## 2021-03-04 DIAGNOSIS — I10 Essential (primary) hypertension: Secondary | ICD-10-CM | POA: Diagnosis not present

## 2021-03-04 DIAGNOSIS — I5032 Chronic diastolic (congestive) heart failure: Secondary | ICD-10-CM | POA: Diagnosis not present

## 2021-03-04 DIAGNOSIS — I300 Acute nonspecific idiopathic pericarditis: Secondary | ICD-10-CM | POA: Diagnosis not present

## 2021-03-04 MED ORDER — ROSUVASTATIN CALCIUM 20 MG PO TABS
20.0000 mg | ORAL_TABLET | Freq: Every day | ORAL | 3 refills | Status: DC
Start: 2021-03-04 — End: 2022-07-22
  Filled 2021-03-04 – 2021-03-29 (×2): qty 90, 90d supply, fill #0
  Filled 2022-01-13: qty 90, 90d supply, fill #1

## 2021-03-04 NOTE — Patient Instructions (Signed)
Medication Instructions:  Take Crestor 20 mg once a day.  *If you need a refill on your cardiac medications before your next appointment, please call your pharmacy*   Lab Work: Return in 3 months for fasting blood work (lipid panel) If you have labs (blood work) drawn today and your tests are completely normal, you will receive your results only by: MyChart Message (if you have MyChart) OR A paper copy in the mail If you have any lab test that is abnormal or we need to change your treatment, we will call you to review the results.   Follow-Up: At [facility], you and your health needs are our priority.  As part of our continuing mission to provide you with exceptional heart care, we have created designated Provider Care Teams.  These Care Teams include your primary Cardiologist (physician) and Advanced Practice Providers (APPs -  Physician Assistants and Nurse Practitioners) who all work together to provide you with the care you need, when you need it.  We recommend signing up for the patient portal called "MyChart".  Sign up information is provided on this After Visit Summary.  MyChart is used to connect with patients for Virtual Visits (Telemedicine).  Patients are able to view lab/test results, encounter notes, upcoming appointments, etc.  Non-urgent messages can be sent to your provider as well.   To learn more about what you can do with MyChart, go to [email or web address].    Your next appointment:   12 month(s)  The format for your next appointment:   In Person  Provider:   Rollene Rotunda, MD

## 2021-03-12 ENCOUNTER — Other Ambulatory Visit (HOSPITAL_COMMUNITY): Payer: Self-pay

## 2021-03-14 ENCOUNTER — Ambulatory Visit: Payer: 59 | Admitting: Cardiology

## 2021-03-27 ENCOUNTER — Other Ambulatory Visit: Payer: Self-pay

## 2021-03-27 ENCOUNTER — Encounter: Payer: 59 | Attending: Surgery | Admitting: Skilled Nursing Facility1

## 2021-03-27 NOTE — Progress Notes (Signed)
Nutrition Assessment for Bariatric Surgery Medical Nutrition Therapy Appt Start Time: 8:16    End Time: 9:20  Patient was seen on 03/27/2021 for Pre-Operative Nutrition Assessment. Letter of approval faxed to [facility] Surgery bariatric surgery program coordinator on 03/27/2021.   Referral stated Supervised Weight Loss (SWL) visits needed: 0  Pt completed visits.   Pt has cleared nutrition requirements.    Planned surgery: sleeve gastrectomy  Pt expectation of surgery: to lose weight Pt expectation of dietitian: to help make changes    NUTRITION ASSESSMENT   Anthropometrics  Start weight at [facility]: 279 lbs (date: 03/27/2021)  Height: 68 in BMI: 42.42 kg/m2     Clinical  Medical hx: GERD, depression, hyperlipidemia , HTN, pericarditis, CHF, prediabetes Medications: see list Labs: BUN/creatinine ratio 27, LDL 111, iron saturation 12, A1C 6.4, vitamin D 23.8 Notable signs/symptoms: constipation, arthritis pain Any previous deficiencies? No  Micronutrient Nutrition Focused Physical Exam: Hair: No issues observed Eyes: No issues observed Mouth: No issues observed Neck: No issues observed Nails: No issues observed Skin: No issues observed  Lifestyle & Dietary Hx  Pt states due to pericarditis she was admitted to the hospital for a month.   Pt states she has been working with healthy weight and wellness.  Pt states she dos some walking throughout the week.  Pt states she works: 8pm-4:30am and then 4:45am-9am working 12 hours   [employer] 4 hour job mon thru [employer] and 8 hour job: so 4 days a week working 12 hours and then 2 days a week working 8 hours and then   Pt state she has been working 2 jobs for multiple years.   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   Pt states her daughter and husband are very supportive.   Pt  state sometimes she does have some depression that pops up.   Pt states she hates non starchy vegetables and fruits but is willing to work on increasing her palate.    24-Hr Dietary Recall First Meal 7:30pm: 3 deviled eggs and 2 cheese  steak sliders with dip  (skippng half the time) Snack:  Second Meal 1:30am: steak + mac n cheese + potato Snack: fruit gummies Third Meal 9am: grits + sausage or bacon and egg sandwich Snack:  Beverages: water, soda, liquor, hot tea   Estimated Energy Needs Calories: 1600   NUTRITION DIAGNOSIS  Overweight/obesity ([place]-3.3) related to past poor dietary habits and physical inactivity as evidenced by patient w/ planned sleeve gastrectomy surgery following dietary guidelines for continued weight loss.    NUTRITION INTERVENTION  Nutrition counseling (C-1) and education (E-2) to facilitate bariatric surgery goals.  Educated pt on micronutrient deficiencies post surgery and strategies to mitigate that risk Importance of vegetables To have an overall healthy diet, adult men and women are recommended to consume anywhere from 2-3 cups of vegetables daily. Vegetables provide a wide range of vitamins and minerals such as vitamin A, vitamin C, potassium, and folic acid. According to the [facility], including fruit and vegetables daily may reduce the risk of cardiovascular disease, certain cancers, and other non-communicable diseases.    Pre-Op Goals Reviewed with the Patient Track food and beverage intake (pen and paper, MyFitness Pal, Baritastic app, etc.) Make healthy food choices while monitoring portion sizes: use the detailed Myplate  given to help create balanced meals inclusive of non starchy vegetables Consume 3 meals per day or try to eat every 3-5 hours Avoid concentrated sugars and fried foods Keep sugar & fat in the single digits per serving on food labels Practice CHEWING your food (aim for applesauce consistency) Practice not  drinking 15 minutes before, during, and 30 minutes after each meal and snack Avoid all carbonated beverages (ex: soda, sparkling beverages)  Limit caffeinated beverages (ex: coffee, tea, energy drinks) Avoid all sugar-sweetened beverages (ex: regular soda, sports drinks)  Avoid alcohol  Aim for 64-100 ounces of FLUID daily (with at least half of fluid intake being plain water)  Aim for at least 60-80 grams of PROTEIN daily Look for a liquid protein source that contains ?15 g protein and ?5 g carbohydrate (ex: shakes, drinks, shots) Make a list of non-food related activities Physical activity is an important part of a healthy lifestyle so keep it moving! The goal is to reach 150 minutes of exercise per week, including cardiovascular and weight baring activity  *Goals that are bolded indicate the pt would like to start working towards these  Handouts Provided Include  Bariatric Surgery handouts (Nutrition Visits, Pre-Op Goals, Protein Shakes, Vitamins & Minerals)  Learning Style & Readiness for Change Teaching method utilized: Visual & Auditory  Demonstrated degree of understanding via: Teach Back  Readiness Level: contemplative  Barriers to learning/adherence to lifestyle change: emotional eating     MONITORING & EVALUATION Dietary intake, weekly physical activity, body weight, and pre-op goals reached at next nutrition visit.    Next Steps  Patient is to follow up at [facility] for Pre-Op Class >2 weeks before surgery for further nutrition education.  Pt has completed visits. No further supervised visits required/recomended

## 2021-03-28 ENCOUNTER — Ambulatory Visit: Payer: 59 | Admitting: Cardiology

## 2021-03-29 ENCOUNTER — Other Ambulatory Visit (HOSPITAL_COMMUNITY): Payer: Self-pay

## 2021-05-16 DIAGNOSIS — R6882 Decreased libido: Secondary | ICD-10-CM | POA: Diagnosis not present

## 2021-05-16 DIAGNOSIS — D649 Anemia, unspecified: Secondary | ICD-10-CM | POA: Diagnosis not present

## 2021-05-16 DIAGNOSIS — R232 Flushing: Secondary | ICD-10-CM | POA: Diagnosis not present

## 2021-06-20 ENCOUNTER — Other Ambulatory Visit (HOSPITAL_COMMUNITY): Payer: Self-pay

## 2021-06-26 ENCOUNTER — Other Ambulatory Visit (HOSPITAL_COMMUNITY): Payer: Self-pay

## 2021-06-26 MED ORDER — OSELTAMIVIR PHOSPHATE 75 MG PO CAPS
ORAL_CAPSULE | ORAL | 0 refills | Status: DC
  Filled 2021-06-26: qty 10, 5d supply, fill #0

## 2021-06-28 ENCOUNTER — Other Ambulatory Visit (HOSPITAL_COMMUNITY): Payer: Self-pay

## 2021-09-11 ENCOUNTER — Ambulatory Visit: Payer: Self-pay | Admitting: Surgery

## 2021-09-11 NOTE — H&P (View-Only) (Signed)
Alicia Wood [id number]   Referring Provider: Timberlake, Kathryn, MD  Subjective   Chief Complaint: Obesity  History of Present Illness: Follow-up regarding surgical treatment of morbid obesity.  Since her last visit she has followed up with Alicia Wood and is cleared from the cardiology standpoint.  She has met with psychology and the dietitian and has been cleared from that standpoint.  Chest x-ray and upper GI were negative/no hiatal hernia.  Preop labs demonstrated low vitamin D for which she started an over-the-counter supplement, hemoglobin A1c 6.4, slightly low iron saturation but iron panel otherwise normal, slightly elevated LDL, otherwise lab work unremarkable. She denies any changes in her health. Has a few questions today. Has started the preop diet.   Initial visit 02/07/2021: 50-year-old woman with history of hyperlipidemia, osteoarthritis, history of pericarditis with effusion in 2017 (cardiologist is Alicia Wood), chronic lower extremity edema, chronic diastolic heart failure, depression, chronic back pain and chest pain who presents in consultation regarding surgical management of morbid obesity. She has struggled with this for several years and has tried numerous methods of weight loss with minimal and transient success. She has worked closely with her primary care provider as well as with [facility] but continues to struggle. She notes feeling chronically uncomfortable and very discouraged by failure to progress despite good efforts. She does endorse fairly poor eating habits when she is not on a specific diet, which tends to happen when she gets discouraged. 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 Her husband also works at the hospital in security. She is interested in surgical treatment of her obesity due to failed medical management and the desire to prevent worsening sequelae in the  future.  Previous abdominal surgeries include hysterectomy, appendectomy  Review of Systems: A complete review of systems was obtained from the patient. I have reviewed this information and discussed as appropriate with the patient. See HPI as well for other ROS.  Medical History: Past Medical History:  Diagnosis Date   Arthritis   Hyperlipidemia   There is no problem list on file for this patient.  Past Surgical History:  Procedure Laterality Date   APPENDECTOMY   HYSTERECTOMY    No Known Allergies  Current Outpatient Medications on File Prior to Visit  Medication Sig Dispense Refill   acetaminophen (TYLENOL) 650 MG ER tablet Take by mouth   FLUoxetine (PROZAC) 10 MG capsule Take by mouth   rosuvastatin (CRESTOR) 10 MG tablet Take 1 tablet (10 mg total) by mouth once daily   No current facility-administered medications on file prior to visit.   Family History  Problem Relation Age of Onset   High blood pressure (Hypertension) Sister   Obesity Sister   High blood pressure (Hypertension) Brother    Social History   Tobacco Use  Smoking Status Never  Smokeless Tobacco Never    Social History   Socioeconomic History   Marital status: Married  Tobacco Use   Smoking status: Never   Smokeless tobacco: Never   Objective:   Vitals:  02/07/21 0933  BP: 134/86  Pulse: 79  SpO2: 96%  Weight: (!) 134.2 kg (295 lb 12.8 oz)  Height: 170.2 cm (5' 7")   Body mass index is 46.33 kg/m.  Alert, well-appearing Unlabored respirations  Assessment and Plan:  Diagnoses and all orders for this visit:  Morbid obesity (CMS-HCC)   She remains a candidate for sleeve gastrectomy.   We have previously discussed the surgery including technical aspects, the risks of bleeding, infection, pain, scarring, injury to intra-abdominal structures, staple line leak or abscess, chronic abdominal pain or nausea, new onset or worsened GERD, DVT/PE, pneumonia, heart attack, stroke, death,  failure to reach weight loss goals and weight regain, hernia. Discussed the typical peri- and postoperative course. Discussed the importance of lifelong behavioral changes to combat the chronic and relapsing disease which is obesity. Questions were welcomed and answered to her satisfaction. We will plan to proceed as scheduled with sleeve gastrectomy.  BMI 30= 192  Alicia Wood Alicia Taja Pentland, MD   

## 2021-09-11 NOTE — H&P (Signed)
Alicia Wood [id number]   Referring Provider: Timberlake, Kathryn, MD  Subjective   Chief Complaint: Obesity  History of Present Illness: Follow-up regarding surgical treatment of morbid obesity.  Since her last visit she has followed up with Dr. Hochrein and is cleared from the cardiology standpoint.  She has met with psychology and the dietitian and has been cleared from that standpoint.  Chest x-ray and upper GI were negative/no hiatal hernia.  Preop labs demonstrated low vitamin D for which she started an over-the-counter supplement, hemoglobin A1c 6.4, slightly low iron saturation but iron panel otherwise normal, slightly elevated LDL, otherwise lab work unremarkable. She denies any changes in her health. Has a few questions today. Has started the preop diet.   Initial visit 02/07/2021: 51-year-old woman with history of hyperlipidemia, osteoarthritis, history of pericarditis with effusion in 2017 (cardiologist is Dr. Hochrein), chronic lower extremity edema, chronic diastolic heart failure, depression, chronic back pain and chest pain who presents in consultation regarding surgical management of morbid obesity. She has struggled with this for several years and has tried numerous methods of weight loss with minimal and transient success. She has worked closely with her primary care provider as well as with [facility] but continues to struggle. She notes feeling chronically uncomfortable and very discouraged by failure to progress despite good efforts. She does endorse fairly poor eating habits when she is not on a specific diet, which tends to happen when she gets discouraged. 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 She is interested in surgical treatment of her obesity due to failed medical management and the desire to prevent worsening sequelae in the  future.  Previous abdominal surgeries include hysterectomy, appendectomy  Review of Systems: A complete review of systems was obtained from the patient. I have reviewed this information and discussed as appropriate with the patient. See HPI as well for other ROS.  Medical History: Past Medical History:  Diagnosis Date   Arthritis   Hyperlipidemia   There is no problem list on file for this patient.  Past Surgical History:  Procedure Laterality Date   APPENDECTOMY   HYSTERECTOMY    No Known Allergies  Current Outpatient Medications on File Prior to Visit  Medication Sig Dispense Refill   acetaminophen (TYLENOL) 650 MG ER tablet Take by mouth   FLUoxetine (PROZAC) 10 MG capsule Take by mouth   rosuvastatin (CRESTOR) 10 MG tablet Take 1 tablet (10 mg total) by mouth once daily   No current facility-administered medications on file prior to visit.   Family History  Problem Relation Age of Onset   High blood pressure (Hypertension) Sister   Obesity Sister   High blood pressure (Hypertension) Brother    Social History   Tobacco Use  Smoking Status Never  Smokeless Tobacco Never    Social History   Socioeconomic History   Marital status: Married  Tobacco Use   Smoking status: Never   Smokeless tobacco: Never   Objective:   Vitals:  02/07/21 0933  BP: 134/86  Pulse: 79  SpO2: 96%  Weight: (!) 134.2 kg (295 lb 12.8 oz)  Height: 170.2 cm (5' 7")   Body mass index is 46.33 kg/m.  Alert, well-appearing Unlabored respirations  Assessment and Plan:  Diagnoses and all orders for this visit:  Morbid obesity (CMS-HCC)   She remains a candidate for sleeve gastrectomy.   We have previously discussed the surgery including technical aspects, the risks of bleeding, infection, pain, scarring, injury to intra-abdominal structures, staple line leak or abscess, chronic abdominal pain or nausea, new onset or worsened GERD, DVT/PE, pneumonia, heart attack, stroke, death,  failure to reach weight loss goals and weight regain, hernia. Discussed the typical peri- and postoperative course. Discussed the importance of lifelong behavioral changes to combat the chronic and relapsing disease which is obesity. Questions were welcomed and answered to her satisfaction. We will plan to proceed as scheduled with sleeve gastrectomy.  BMI 30= 192  Alicia AMANDA CONNOR, MD   

## 2021-09-16 ENCOUNTER — Encounter: Payer: 59 | Attending: Surgery | Admitting: Skilled Nursing Facility1

## 2021-09-16 ENCOUNTER — Encounter: Payer: Self-pay | Admitting: Skilled Nursing Facility1

## 2021-09-16 NOTE — Progress Notes (Signed)
Pre-Operative Nutrition Class:    Patient was seen on 09/16/2021 for Pre-Operative Bariatric Surgery Education at [facility]    Surgery date: 10/01/2021 Surgery type: sleeve Start weight at [facility]: 279 Weight today: 315  Samples given per MNT protocol. Patient educated on appropriate usage:  Bariatric Advantage Multivitamin Lot # [id number] Exp:08/24   Bariatric Advantage Calcium  Lot #[id number] Exp: 01/27/2022   Protein Shake Lot # 01/24 Exp: [id number]   The following the learning objectives were met by the patient during this course: Identify Pre-Op Dietary Goals and will begin 2 weeks pre-operatively Identify appropriate sources of fluids and proteins  State protein recommendations and appropriate sources pre and post-operatively Identify Post-Operative Dietary Goals and will follow for 2 weeks post-operatively Identify appropriate multivitamin and calcium sources Describe the need for physical activity post-operatively and will follow MD recommendations State when to call healthcare provider regarding medication questions or post-operative complications When having a diagnosis of diabetes understanding hypoglycemia symptoms and the inclusion of 1 complex carbohydrate per meal  Handouts given during class include: Pre-Op Bariatric Surgery Diet Handout Protein Shake Handout Post-Op Bariatric Surgery Nutrition Handout BEL[facility] Program Information Flyer Support Group Information Flyer [facility] Bariatric Supplements Price List  Follow-Up Plan: Patient will follow-up at [facility] 2 weeks post operatively for diet advancement per MD.

## 2021-09-18 ENCOUNTER — Telehealth: Payer: Self-pay | Admitting: Skilled Nursing Facility1

## 2021-09-18 ENCOUNTER — Other Ambulatory Visit (HOSPITAL_COMMUNITY): Payer: Self-pay

## 2021-09-18 NOTE — Telephone Encounter (Signed)
Pt called asking if she was supposed to take her multivitamin and calcium prior to surgery.   Dietitian advised to buy them now but not start taking them prior to surgery.

## 2021-09-26 ENCOUNTER — Encounter (HOSPITAL_COMMUNITY)
Admission: RE | Admit: 2021-09-26 | Discharge: 2021-09-26 | Disposition: A | Discharge status: Home / Self Care | Payer: 59 | Source: Ambulatory Visit | Attending: Surgery | Admitting: Surgery

## 2021-09-26 ENCOUNTER — Other Ambulatory Visit: Payer: Self-pay

## 2021-09-26 ENCOUNTER — Encounter (HOSPITAL_COMMUNITY): Payer: Self-pay

## 2021-09-26 DIAGNOSIS — Z01812 Encounter for preprocedural laboratory examination: Secondary | ICD-10-CM | POA: Diagnosis not present

## 2021-09-26 DIAGNOSIS — I5032 Chronic diastolic (congestive) heart failure: Secondary | ICD-10-CM | POA: Insufficient documentation

## 2021-09-26 DIAGNOSIS — Z6841 Body Mass Index (BMI) 40.0 and over, adult: Secondary | ICD-10-CM | POA: Diagnosis not present

## 2021-09-26 DIAGNOSIS — K219 Gastro-esophageal reflux disease without esophagitis: Secondary | ICD-10-CM | POA: Diagnosis not present

## 2021-09-26 DIAGNOSIS — I110 Hypertensive heart disease with heart failure: Secondary | ICD-10-CM | POA: Diagnosis not present

## 2021-09-26 LAB — CBC WITH DIFFERENTIAL/PLATELET
Abs Immature Granulocytes: 0.04 10*3/uL (ref 0.00–0.07)
Basophils Absolute: 0 10*3/uL (ref 0.0–0.1)
Basophils Relative: 0 %
Eosinophils Absolute: 0.1 10*3/uL (ref 0.0–0.5)
Eosinophils Relative: 2 %
HCT: 35.7 % — ABNORMAL LOW (ref 36.0–46.0)
Hemoglobin: 11.5 g/dL — ABNORMAL LOW (ref 12.0–15.0)
Immature Granulocytes: 1 %
Lymphocytes Relative: 39 %
Lymphs Abs: 2.1 10*3/uL (ref 0.7–4.0)
MCH: 28.5 pg (ref 26.0–34.0)
MCHC: 32.2 g/dL (ref 30.0–36.0)
MCV: 88.4 fL (ref 80.0–100.0)
Monocytes Absolute: 0.5 10*3/uL (ref 0.1–1.0)
Monocytes Relative: 8 %
Neutro Abs: 2.7 10*3/uL (ref 1.7–7.7)
Neutrophils Relative %: 50 %
Platelets: 309 10*3/uL (ref 150–400)
RBC: 4.04 MIL/uL (ref 3.87–5.11)
RDW: 14 % (ref 11.5–15.5)
WBC: 5.4 10*3/uL (ref 4.0–10.5)
nRBC: 0 % (ref 0.0–0.2)

## 2021-09-26 LAB — COMPREHENSIVE METABOLIC PANEL
ALT: 16 U/L (ref 0–44)
AST: 18 U/L (ref 15–41)
Albumin: 3.8 g/dL (ref 3.5–5.0)
Alkaline Phosphatase: 80 U/L (ref 38–126)
Anion gap: 7 (ref 5–15)
BUN: 19 mg/dL (ref 6–20)
CO2: 25 mmol/L (ref 22–32)
Calcium: 9.4 mg/dL (ref 8.9–10.3)
Chloride: 109 mmol/L (ref 98–111)
Creatinine, Ser: 0.82 mg/dL (ref 0.44–1.00)
GFR, Estimated: >60 mL/min (ref >60)
Glucose, Bld: 100 mg/dL — ABNORMAL HIGH (ref 70–99)
Potassium: 3.9 mmol/L (ref 3.5–5.1)
Sodium: 141 mmol/L (ref 135–145)
Total Bilirubin: 0.5 mg/dL (ref 0.3–1.2)
Total Protein: 7.1 g/dL (ref 6.5–8.1)

## 2021-09-26 NOTE — Patient Instructions (Addendum)
DUE TO COVID-19 ONLY TWO VISITORS  (aged 53 and older)  ARE ALLOWED TO COME WITH YOU AND STAY IN THE WAITING ROOM ONLY DURING PRE OP AND PROCEDURE.   **NO VISITORS ARE ALLOWED IN THE SHORT STAY AREA OR RECOVERY ROOM!!**  IF YOU WILL BE ADMITTED INTO THE HOSPITAL YOU ARE ALLOWED ONLY FOUR SUPPORT PEOPLE DURING VISITATION HOURS ONLY (7 AM -8PM)   The support person(s) must pass our screening, gel in and out, and wear a mask at all times, including in the patient's room. Patients must also wear a mask when staff or their support person are in the room. Visitors GUEST BADGE MUST BE WORN VISIBLY  One adult visitor may remain with you overnight and MUST be in the room by 8 P.M.     Your procedure is scheduled on: 10/01/21   Report to [facility] Main Entrance    Report to admitting at  11:15 AM   Call this number if you have problems the morning of surgery [phone]    After Midnight you may have the following liquids until _10:30_____ AM/  DAY OF SURGERY  Water Black Coffee (sugar ok, NO MILK/CREAM OR CREAMERS)  Tea (sugar ok, NO MILK/CREAM OR CREAMERS) regular and decaf                             Plain Jell-O (NO RED)                                           Fruit ices (not with fruit pulp, NO RED)                                     Popsicles (NO RED)                                                                  Juice: apple, WHITE grape, WHITE cranberry Sports drinks like Gatorade (NO RED) Clear broth(vegetable,chicken,beef)                         If you have questions, please contact your surgeon's office.       Oral Hygiene is also important to reduce your risk of infection.                                    Remember - BRUSH YOUR TEETH THE MORNING OF SURGERY WITH YOUR REGULAR TOOTHPASTE   Do NOT smoke after Midnight   Take these medicines the morning of surgery with A SIP OF WATER: none                                You may not have any metal on your  body including hair pins, jewelry, and body piercing             Do not wear make-up, lotions,  powders, perfumes/cologne, or deodorant  Do not wear nail polish including gel and S&S, artificial/acrylic nails, or any other type of covering on natural nails including finger and toenails. If you have artificial nails, gel coating, etc. that needs to be removed by a nail salon please have this removed prior to surgery or surgery may need to be canceled/ delayed if the surgeon/ anesthesia feels like they are unable to be safely monitored.   Do not shave  48 hours prior to surgery.     Do not bring valuables to the hospital. [facility] IS NOT             RESPONSIBLE   FOR VALUABLES.   Contacts, dentures or bridgework may not be worn into surgery.  NO SOLID FOOD AFTER 6:00 PM THE NIGHT BEFORE YOUR SURGERY. Follow instructions from Dr's office 12 hours from time of surgery  YOU MAY DRINK CLEAR FLUIDS THE MORNING OF SURGERY UNTIL:10:30 am   THE G2 DRINK YOU DRINK BEFORE YOU LEAVE HOME WILL BE THE LAST FLUIDS YOU DRINK BEFORE SURGERY THEN NOTHING MORE BY MOUTH.   PAIN IS EXPECTED AFTER SURGERY AND WILL NOT BE COMPLETELY ELIMINATED.   AMBULATION AND TYLENOL WILL HELP REDUCE INCISIONAL AND GAS PAIN. MOVEMENT IS KEY!  YOU ARE EXPECTED TO BE OUT OF BED WITHIN 4 HOURS OF ADMISSION TO YOUR PATIENT ROOM.  SITTING IN THE RECLINER THROUGHOUT THE DAY IS IMPORTANT FOR DRINKING FLUIDS AND MOVING GAS THROUGHOUT THE GI TRACT.  COMPRESSION STOCKINGS SHOULD BE WORN [facility] STAY UNLESS YOU ARE WALKING.   INCENTIVE SPIROMETER SHOULD BE USED EVERY HOUR WHILE AWAKE TO DECREASE POST-OPERATIVE COMPLICATIONS SUCH AS PNEUMONIA.  WHEN DISCHARGED HOME, IT IS IMPORTANT TO CONTINUE TO WALK EVERY HOUR AND USE THE INCENTIVE SPIROMETER EVERY HOUR.           Bring small overnight bag day of surgery.   DO NOT BRING YOUR HOME MEDICATIONS TO THE HOSPITAL. PHARMACY WILL DISPENSE MEDICATIONS LISTED ON  YOUR MEDICATION LIST TO YOU DURING YOUR ADMISSION IN THE HOSPITAL!                     Please read over the following fact sheets you were given: IF YOU HAVE QUESTIONS ABOUT YOUR PRE-OP INSTRUCTIONS PLEASE CALL [phone]     [facility] - Preparing for Surgery Before surgery, you can play an important role.  Because skin is not sterile, your skin needs to be as free of germs as possible.  You can reduce the number of germs on your skin by washing with CHG (chlorahexidine gluconate) soap before surgery.  CHG is an antiseptic cleaner which kills germs and bonds with the skin to continue killing germs even after washing. Please DO NOT use if you have an allergy to CHG or antibacterial soaps.  If your skin becomes reddened/irritated stop using the CHG and inform your nurse when you arrive at Short Stay. Do not shave (including legs and underarms) for at least 48 hours prior to the first CHG shower.   Please follow these instructions carefully:  1.  Shower with CHG Soap the night before surgery and the  morning of Surgery.  2.  If you choose to wash your hair, wash your hair first as usual with your  normal  shampoo.  3.  After you shampoo, rinse your hair and body thoroughly to remove the  shampoo.  4.  Use CHG as you would any other liquid soap.  You can apply chg directly  to the skin and wash                       Gently with a scrungie or clean washcloth.  5.  Apply the CHG Soap to your body ONLY FROM THE NECK DOWN.   Do not use on face/ open                           Wound or open sores. Avoid contact with eyes, ears mouth and genitals (private parts).                       Wash face,  Genitals (private parts) with your normal soap.             6.  Wash thoroughly, paying special attention to the area where your surgery  will be performed.  7.  Thoroughly rinse your body with warm water from the neck down.  8.  DO NOT shower/wash with your normal soap after using  and rinsing off  the CHG Soap.                9.  Pat yourself dry with a clean towel.            10.  Wear clean pajamas.            11.  Place clean sheets on your bed the night of your first shower and do not  sleep with pets. Day of Surgery : Do not apply any lotions/deodorants the morning of surgery.  Please wear clean clothes to the hospital/surgery center.  FAILURE TO FOLLOW THESE INSTRUCTIONS MAY RESULT IN THE CANCELLATION OF YOUR SURGERY    ________________________________________________________________________   Incentive Spirometer  An incentive spirometer is a tool that can help keep your lungs clear and active. This tool measures how well you are filling your lungs with each breath. Taking long deep breaths may help reverse or decrease the chance of developing breathing (pulmonary) problems (especially infection) following: A long period of time when you are unable to move or be active. BEFORE THE PROCEDURE  If the spirometer includes an indicator to show your best effort, your nurse or respiratory therapist will set it to a desired goal. If possible, sit up straight or lean slightly forward. Try not to slouch. Hold the incentive spirometer in an upright position. INSTRUCTIONS FOR USE  Sit on the edge of your bed if possible, or sit up as far as you can in bed or on a chair. Hold the incentive spirometer in an upright position. Breathe out normally. Place the mouthpiece in your mouth and seal your lips tightly around it. Breathe in slowly and as deeply as possible, raising the piston or the ball toward the top of the column. Hold your breath for 3-5 seconds or for as long as possible. Allow the piston or ball to fall to the bottom of the column. Remove the mouthpiece from your mouth and breathe out normally. Rest for a few seconds and repeat Steps 1 through 7 at least 10 times every 1-2 hours when you are awake. Take your time and take a few normal breaths between deep  breaths. The spirometer may include an indicator to show your best effort. Use the indicator as a goal to work toward during each repetition.  After each set of 10 deep breaths, practice coughing to be sure your lungs are clear. If you have an incision (the cut made at the time of surgery), support your incision when coughing by placing a pillow or rolled up towels firmly against it. Once you are able to get out of bed, walk around indoors and cough well. You may stop using the incentive spirometer when instructed by your caregiver.  RISKS AND COMPLICATIONS Take your time so you do not get dizzy or light-headed. If you are in pain, you may need to take or ask for pain medication before doing incentive spirometry. It is harder to take a deep breath if you are having pain. AFTER USE Rest and breathe slowly and easily. It can be helpful to keep track of a log of your progress. Your caregiver can provide you with a simple table to help with this. If you are using the spirometer at home, follow these instructions: SEEK MEDICAL CARE IF:  You are having difficultly using the spirometer. You have trouble using the spirometer as often as instructed. Your pain medication is not giving enough relief while using the spirometer. You develop fever of 100.5 F (38.1 C) or higher. SEEK IMMEDIATE MEDICAL CARE IF:  You cough up bloody sputum that had not been present before. You develop fever of 102 F (38.9 C) or greater. You develop worsening pain at or near the incision site. MAKE SURE YOU:  Understand these instructions. Will watch your condition. Will get help right away if you are not doing well or get worse. Document Released: 07/21/2006 Document Revised: 06/02/2011 Document Reviewed: 09/21/2006 [facility] Patient Information 2014 [place], [place].   ________________________________________________________________________

## 2021-09-26 NOTE — Progress Notes (Signed)
Anesthesia note:  Bowel prep reminder:NA  PCP - Dr. Chanetta Marshall  Cardiologist -Dr. [name] Other-   Chest x-ray - 02/22/21-epic EKG - 02/21/21-epic Stress Test - no ECHO - 2017  Cardiac Cath - 2017 when in hospital for pericarditis  Pacemaker/ICD device last checked:NA  Sleep Study - no CPAP -   Pt is pre diabetic-NA Fasting Blood Sugar -  Checks Blood Sugar _____  Blood Thinner:NA Blood Thinner Instructions: Aspirin Instructions: Last Dose:  Anesthesia review: yes  Patient denies shortness of breath, fever, cough and chest pain at PAT appointment Pt sometimes has SOB climbing stairs due to weight..  Patient verbalized understanding of instructions that were given to them at the PAT appointment. Patient was also instructed that they will need to review over the PAT instructions again at home before surgery. yes

## 2021-09-27 NOTE — Progress Notes (Signed)
Anesthesia Chart Review   Case: [id number] Date/Time: 10/01/21 1315   Procedures:      LAPAROSCOPIC SLEEVE GASTRECTOMY     UPPER GI ENDOSCOPY   Anesthesia type: General   Pre-op diagnosis: MORBID OBESITY   Location: [facility] ROOM 01 / [facility]   Surgeons: Berna Bue, MD       DISCUSSION:51 y.o. never smoker with h/o HTN, GERD, chronic diastolic dysfunction, h/o pericarditis, morbid obesity scheduled for above procedure 10/01/2021 with Dr. Phylliss Blakes.   Pt seen by cardiology 03/04/2021 for preoperative evaluation.  Per OV note, "The patient is at acceptable risk for planned bariatric surgery. No further testing is indicated. There are no high risk findings. She has a very active job and no symptoms."  Anticipate pt can proceed with planned procedure barring acute status change.   VS: BP (!) 143/97   Pulse 67   Temp 36.8 C (Oral)   Resp 18   Ht 5' 8.75" (1.746 m)   Wt (!) 139.3 kg   SpO2 98%   BMI 45.67 kg/m   PROVIDERS: Shon Hale, MD is PCP   Rollene Rotunda, MD is Cardiologist  LABS: Labs reviewed: Acceptable for surgery. (all labs ordered are listed, but only abnormal results are displayed)  Labs Reviewed  CBC WITH DIFFERENTIAL/PLATELET - Abnormal; Notable for the following components:      Result Value   Hemoglobin 11.5 (*)    HCT 35.7 (*)    All other components within normal limits  COMPREHENSIVE METABOLIC PANEL - Abnormal; Notable for the following components:   Glucose, Bld 100 (*)    All other components within normal limits  TYPE AND SCREEN     IMAGES:   EKG: 02/21/21 Rate 70 bpm  NSR  CV: Echo 02/14/20 1. Left ventricular ejection fraction by 3D volume is 61 %. The left  ventricle has normal function. The left ventricle has no regional wall  motion abnormalities. Left ventricular diastolic parameters were normal.  The average left ventricular global  longitudinal strain is -22.4 %. The global longitudinal strain is normal.   2.  Right ventricular systolic function is normal. The right ventricular  size is normal. There is normal pulmonary artery systolic pressure. The  estimated right ventricular systolic pressure is 31.2 mmHg.   3. Left atrial size was mildly dilated.   4. Trace pericardial fluid, posterior to the left ventricle. No definite  features of constrictive physiology.   5. The mitral valve is normal in structure. Trivial mitral valve  regurgitation. No evidence of mitral stenosis.   6. The aortic valve is tricuspid. Aortic valve regurgitation is trivial.  No aortic stenosis is present.   7. The inferior vena cava is dilated in size with >50% respiratory  variability, suggesting right atrial pressure of 8 mmHg.   Cardiac Cath 01/21/2016 1. No angiographic evidence of CAD.  2. Normal filling pressures.  3. No hemodynamic findings that are suggestive of constrictive physiology.  Past Medical History:  Diagnosis Date   Back pain    Chest pain    a. Initially in setting of pericarditis in 11/2015; b. Ongoing cp despite prednisone, colchicine, ibuprofen,and narcotic therapy;  c. 12/2015 CTA Chest: No PE, stable small-mod pericardial effusion;  d. 12/2015 Cath: nl cors.   Chest pain    Chronic diastolic CHF (congestive heart failure) (HCC)    a. 11/2015 Echo: EF 55-60%, grade 2 diastolic dysfxn.   Depression    GERD (gastroesophageal reflux disease)  Headache    Hyperlipidemia    Hypertension    Patient states she does not have hypertension.    Osteoarthritis    Pericardial effusion    a. 11/23/2015 Echo: mod pericardial effusion, no tamponade; b. 12/05/2015 Echo: small circumferential pericardial effusion; c. 12/06/2015 Cardiac MRI: pericardial thickening w/ circumferential late gad enhancement of pericardium and epicardium-->suspicious for subacute effusive constrictive pericarditis;  d. 12/19/2015 Echo: triv pericardial effusion;  e. 01/06/2016 CT Chest: small to mod pericard eff.   Pericarditis    a.  11/2015 Cardiac MRI; EF 57%, mild MR, triv TR, mild pericardial effusion, thickening of pericardium focally up to 81mm w/ significant circumferential late gad enhancement of pericardium and epicardium sugg of subacute effusive constrictive pericarditis;  b. 12/2015 R Heart Cath: nl right heart pressures w/o evidence of constrictive physiology.   Torn meniscus     Past Surgical History:  Procedure Laterality Date   ABDOMINAL HYSTERECTOMY     age 46   APPENDECTOMY     as a teen   CARDIAC CATHETERIZATION N/A 01/21/2016   Procedure: Right/Left Heart Cath and Coronary Angiography;  Surgeon: Kathleene Hazel, MD;  Location: [facility] INVASIVE CV LAB;  Service: Cardiovascular;  Laterality: N/A;   ESOPHAGOGASTRODUODENOSCOPY (EGD) WITH PROPOFOL N/A 01/30/2016   Procedure: ESOPHAGOGASTRODUODENOSCOPY (EGD) WITH PROPOFOL;  Surgeon: [name], MD;  Location: [facility] ENDOSCOPY;  Service: Endoscopy;  Laterality: N/A;   TUBAL LIGATION  1992    MEDICATIONS:  acetaminophen (TYLENOL) 650 MG CR tablet   Colchicine 0.6 MG CAPS   FLUoxetine (PROZAC) 20 MG capsule   oseltamivir (TAMIFLU) 75 MG capsule   rosuvastatin (CRESTOR) 20 MG tablet   No current facility-administered medications for this encounter.    Jodell Cipro Ward, PA-C [facility]-Surgical Testing [phone]

## 2021-10-01 ENCOUNTER — Encounter (HOSPITAL_COMMUNITY)
Admission: RE | Disposition: A | Discharge status: Home / Self Care | Payer: Self-pay | Source: Home / Self Care | Attending: Surgery

## 2021-10-01 ENCOUNTER — Other Ambulatory Visit: Payer: Self-pay

## 2021-10-01 ENCOUNTER — Encounter (HOSPITAL_COMMUNITY): Payer: Self-pay | Admitting: Surgery

## 2021-10-01 ENCOUNTER — Inpatient Hospital Stay (HOSPITAL_COMMUNITY): Payer: 59 | Admitting: Certified Registered Nurse Anesthetist

## 2021-10-01 ENCOUNTER — Inpatient Hospital Stay (HOSPITAL_COMMUNITY): Payer: 59 | Admitting: Physician Assistant

## 2021-10-01 ENCOUNTER — Inpatient Hospital Stay (HOSPITAL_COMMUNITY)
Admission: RE | Admit: 2021-10-01 | Discharge: 2021-10-03 | DRG: 620 | Disposition: A | Discharge status: Home / Self Care | Payer: 59 | Attending: Surgery | Admitting: Surgery

## 2021-10-01 DIAGNOSIS — Z79899 Other long term (current) drug therapy: Secondary | ICD-10-CM | POA: Diagnosis not present

## 2021-10-01 DIAGNOSIS — F32A Depression, unspecified: Secondary | ICD-10-CM | POA: Diagnosis not present

## 2021-10-01 DIAGNOSIS — E785 Hyperlipidemia, unspecified: Secondary | ICD-10-CM | POA: Diagnosis present

## 2021-10-01 DIAGNOSIS — Z91040 Latex allergy status: Secondary | ICD-10-CM

## 2021-10-01 DIAGNOSIS — Z6841 Body Mass Index (BMI) 40.0 and over, adult: Secondary | ICD-10-CM

## 2021-10-01 DIAGNOSIS — I5032 Chronic diastolic (congestive) heart failure: Secondary | ICD-10-CM | POA: Diagnosis not present

## 2021-10-01 DIAGNOSIS — I509 Heart failure, unspecified: Secondary | ICD-10-CM | POA: Diagnosis not present

## 2021-10-01 DIAGNOSIS — I110 Hypertensive heart disease with heart failure: Secondary | ICD-10-CM | POA: Diagnosis not present

## 2021-10-01 DIAGNOSIS — I272 Pulmonary hypertension, unspecified: Secondary | ICD-10-CM | POA: Diagnosis present

## 2021-10-01 DIAGNOSIS — Z8249 Family history of ischemic heart disease and other diseases of the circulatory system: Secondary | ICD-10-CM | POA: Diagnosis not present

## 2021-10-01 HISTORY — PX: LAPAROSCOPIC GASTRIC SLEEVE RESECTION: SHX5895

## 2021-10-01 HISTORY — PX: UPPER GI ENDOSCOPY: SHX6162

## 2021-10-01 LAB — ABO/RH: ABO/RH(D): O POS

## 2021-10-01 LAB — TYPE AND SCREEN
ABO/RH(D): O POS
Antibody Screen: NEGATIVE

## 2021-10-01 SURGERY — GASTRECTOMY, SLEEVE, LAPAROSCOPIC
Anesthesia: General | Site: Esophagus

## 2021-10-01 MED ORDER — 0.9 % SODIUM CHLORIDE (POUR BTL) OPTIME
TOPICAL | Status: DC | PRN
  Administered 2021-10-01: 1000 mL

## 2021-10-01 MED ORDER — APREPITANT 40 MG PO CAPS
40.0000 mg | ORAL_CAPSULE | ORAL | Status: AC
  Administered 2021-10-01: 40 mg via ORAL
  Filled 2021-10-01: qty 1

## 2021-10-01 MED ORDER — METHOCARBAMOL 1000 MG/10ML IJ SOLN
500.0000 mg | Freq: Four times a day (QID) | INTRAVENOUS | Status: DC | PRN
  Administered 2021-10-01 – 2021-10-02 (×2): 500 mg via INTRAVENOUS
  Filled 2021-10-01: qty 5
  Filled 2021-10-01: qty 500

## 2021-10-01 MED ORDER — CHLORHEXIDINE GLUCONATE 4 % EX LIQD: 60.0000 mL | Freq: Once | CUTANEOUS | Status: DC

## 2021-10-01 MED ORDER — LIDOCAINE HCL (PF) 2 % IJ SOLN
INTRAMUSCULAR | Status: AC
  Filled 2021-10-01: qty 5

## 2021-10-01 MED ORDER — BUPIVACAINE-EPINEPHRINE 0.25% -1:200000 IJ SOLN
INTRAMUSCULAR | Status: DC | PRN
  Administered 2021-10-01: 30 mL

## 2021-10-01 MED ORDER — STERILE WATER FOR IRRIGATION IR SOLN
Status: DC | PRN
  Administered 2021-10-01: 2000 mL

## 2021-10-01 MED ORDER — SODIUM CHLORIDE 0.9 % IV SOLN: INTRAVENOUS | Status: DC

## 2021-10-01 MED ORDER — ENOXAPARIN SODIUM 30 MG/0.3ML IJ SOSY
30.0000 mg | PREFILLED_SYRINGE | Freq: Two times a day (BID) | INTRAMUSCULAR | Status: DC
  Administered 2021-10-02 – 2021-10-03 (×3): 30 mg via SUBCUTANEOUS
  Filled 2021-10-01 (×3): qty 0.3

## 2021-10-01 MED ORDER — PROPOFOL 10 MG/ML IV BOLUS
INTRAVENOUS | Status: DC | PRN
  Administered 2021-10-01: 200 mg via INTRAVENOUS

## 2021-10-01 MED ORDER — OXYCODONE HCL 5 MG/5ML PO SOLN
5.0000 mg | Freq: Four times a day (QID) | ORAL | Status: DC | PRN
  Administered 2021-10-02: 5 mg via ORAL
  Filled 2021-10-01 (×2): qty 5

## 2021-10-01 MED ORDER — PROMETHAZINE HCL 25 MG/ML IJ SOLN: 6.2500 mg | INTRAMUSCULAR | Status: DC | PRN

## 2021-10-01 MED ORDER — PROPOFOL 10 MG/ML IV BOLUS
INTRAVENOUS | Status: AC
  Filled 2021-10-01: qty 20

## 2021-10-01 MED ORDER — EPHEDRINE SULFATE-NACL 50-0.9 MG/10ML-% IV SOSY
PREFILLED_SYRINGE | INTRAVENOUS | Status: DC | PRN
  Administered 2021-10-01 (×2): 5 mg via INTRAVENOUS
  Administered 2021-10-01: 10 mg via INTRAVENOUS
  Administered 2021-10-01: 5 mg via INTRAVENOUS

## 2021-10-01 MED ORDER — GABAPENTIN 300 MG PO CAPS
300.0000 mg | ORAL_CAPSULE | ORAL | Status: AC
  Administered 2021-10-01: 300 mg via ORAL
  Filled 2021-10-01: qty 1

## 2021-10-01 MED ORDER — TRAMADOL HCL 50 MG PO TABS
50.0000 mg | ORAL_TABLET | Freq: Four times a day (QID) | ORAL | Status: DC | PRN
  Administered 2021-10-01 – 2021-10-03 (×3): 50 mg via ORAL
  Filled 2021-10-01 (×5): qty 1

## 2021-10-01 MED ORDER — HYDROMORPHONE HCL 1 MG/ML IJ SOLN
INTRAMUSCULAR | Status: AC
  Filled 2021-10-01: qty 1

## 2021-10-01 MED ORDER — LIDOCAINE 2% (20 MG/ML) 5 ML SYRINGE
INTRAMUSCULAR | Status: DC | PRN
  Administered 2021-10-01: 100 mg via INTRAVENOUS
  Administered 2021-10-01: 1.5 mg/kg/h via INTRAVENOUS

## 2021-10-01 MED ORDER — ONDANSETRON HCL 4 MG/2ML IJ SOLN
INTRAMUSCULAR | Status: AC
  Filled 2021-10-01: qty 2

## 2021-10-01 MED ORDER — ENSURE MAX PROTEIN PO LIQD
2.0000 [oz_av] | ORAL | Status: DC
  Administered 2021-10-02 – 2021-10-03 (×4): 2 [oz_av] via ORAL

## 2021-10-01 MED ORDER — ONDANSETRON HCL 4 MG/2ML IJ SOLN: 4.0000 mg | INTRAMUSCULAR | Status: DC | PRN

## 2021-10-01 MED ORDER — ONDANSETRON HCL 4 MG/2ML IJ SOLN
INTRAMUSCULAR | Status: DC | PRN
  Administered 2021-10-01: 4 mg via INTRAVENOUS

## 2021-10-01 MED ORDER — ACETAMINOPHEN 500 MG PO TABS
1000.0000 mg | ORAL_TABLET | ORAL | Status: AC
  Administered 2021-10-01: 1000 mg via ORAL
  Filled 2021-10-01: qty 2

## 2021-10-01 MED ORDER — CHLORHEXIDINE GLUCONATE 0.12 % MT SOLN
15.0000 mL | Freq: Once | OROMUCOSAL | Status: AC
Start: 2021-10-01 — End: 2021-10-01
  Administered 2021-10-01: 15 mL via OROMUCOSAL

## 2021-10-01 MED ORDER — ACETAMINOPHEN 500 MG PO TABS
1000.0000 mg | ORAL_TABLET | Freq: Three times a day (TID) | ORAL | Status: DC
  Administered 2021-10-01 – 2021-10-03 (×5): 1000 mg via ORAL
  Filled 2021-10-01 (×5): qty 2

## 2021-10-01 MED ORDER — HYDRALAZINE HCL 20 MG/ML IJ SOLN: 10.0000 mg | INTRAMUSCULAR | Status: DC | PRN

## 2021-10-01 MED ORDER — FENTANYL CITRATE (PF) 250 MCG/5ML IJ SOLN
INTRAMUSCULAR | Status: AC
  Filled 2021-10-01: qty 5

## 2021-10-01 MED ORDER — BUPIVACAINE LIPOSOME 1.3 % IJ SUSP
INTRAMUSCULAR | Status: DC | PRN
  Administered 2021-10-01: 20 mL

## 2021-10-01 MED ORDER — SIMETHICONE 80 MG PO CHEW
80.0000 mg | CHEWABLE_TABLET | Freq: Four times a day (QID) | ORAL | Status: DC | PRN
  Administered 2021-10-01: 80 mg via ORAL
  Filled 2021-10-01: qty 1

## 2021-10-01 MED ORDER — ROCURONIUM BROMIDE 10 MG/ML (PF) SYRINGE
PREFILLED_SYRINGE | INTRAVENOUS | Status: DC | PRN
  Administered 2021-10-01: 70 mg via INTRAVENOUS

## 2021-10-01 MED ORDER — SUGAMMADEX SODIUM 500 MG/5ML IV SOLN
INTRAVENOUS | Status: AC
  Filled 2021-10-01: qty 5

## 2021-10-01 MED ORDER — LACTATED RINGERS IR SOLN
Status: DC | PRN
  Administered 2021-10-01: 1000 mL

## 2021-10-01 MED ORDER — PHENYLEPHRINE HCL (PRESSORS) 10 MG/ML IV SOLN
INTRAVENOUS | Status: AC
  Filled 2021-10-01: qty 1

## 2021-10-01 MED ORDER — DEXAMETHASONE SODIUM PHOSPHATE 10 MG/ML IJ SOLN
INTRAMUSCULAR | Status: DC | PRN
  Administered 2021-10-01: 6 mg via INTRAVENOUS

## 2021-10-01 MED ORDER — ROCURONIUM BROMIDE 10 MG/ML (PF) SYRINGE
PREFILLED_SYRINGE | INTRAVENOUS | Status: AC
  Filled 2021-10-01: qty 10

## 2021-10-01 MED ORDER — DOCUSATE SODIUM 100 MG PO CAPS
100.0000 mg | ORAL_CAPSULE | Freq: Two times a day (BID) | ORAL | Status: DC
  Administered 2021-10-01 – 2021-10-03 (×4): 100 mg via ORAL
  Filled 2021-10-01 (×4): qty 1

## 2021-10-01 MED ORDER — GABAPENTIN 100 MG PO CAPS
200.0000 mg | ORAL_CAPSULE | Freq: Two times a day (BID) | ORAL | Status: DC
  Administered 2021-10-01 – 2021-10-03 (×4): 200 mg via ORAL
  Filled 2021-10-01 (×4): qty 2

## 2021-10-01 MED ORDER — LACTATED RINGERS IV SOLN: INTRAVENOUS | Status: DC

## 2021-10-01 MED ORDER — SCOPOLAMINE 1 MG/3DAYS TD PT72
1.0000 | MEDICATED_PATCH | TRANSDERMAL | Status: DC
  Administered 2021-10-01: 1.5 mg via TRANSDERMAL
  Filled 2021-10-01: qty 1

## 2021-10-01 MED ORDER — FENTANYL CITRATE (PF) 250 MCG/5ML IJ SOLN
INTRAMUSCULAR | Status: DC | PRN
  Administered 2021-10-01 (×2): 50 ug via INTRAVENOUS
  Administered 2021-10-01: 150 ug via INTRAVENOUS

## 2021-10-01 MED ORDER — EPHEDRINE 5 MG/ML INJ
INTRAVENOUS | Status: AC
  Filled 2021-10-01: qty 5

## 2021-10-01 MED ORDER — PHENYLEPHRINE 80 MCG/ML (10ML) SYRINGE FOR IV PUSH (FOR BLOOD PRESSURE SUPPORT)
PREFILLED_SYRINGE | INTRAVENOUS | Status: AC
  Filled 2021-10-01: qty 10

## 2021-10-01 MED ORDER — PHENYLEPHRINE 80 MCG/ML (10ML) SYRINGE FOR IV PUSH (FOR BLOOD PRESSURE SUPPORT)
PREFILLED_SYRINGE | INTRAVENOUS | Status: DC | PRN
  Administered 2021-10-01: 80 ug via INTRAVENOUS

## 2021-10-01 MED ORDER — SODIUM CHLORIDE 0.9 % IV SOLN
2.0000 g | INTRAVENOUS | Status: AC
  Administered 2021-10-01: 2 g via INTRAVENOUS
  Filled 2021-10-01: qty 2

## 2021-10-01 MED ORDER — BUPIVACAINE LIPOSOME 1.3 % IJ SUSP
INTRAMUSCULAR | Status: AC
  Filled 2021-10-01: qty 20

## 2021-10-01 MED ORDER — DEXAMETHASONE SODIUM PHOSPHATE 10 MG/ML IJ SOLN
INTRAMUSCULAR | Status: AC
  Filled 2021-10-01: qty 1

## 2021-10-01 MED ORDER — METOPROLOL TARTRATE 5 MG/5ML IV SOLN: 5.0000 mg | Freq: Four times a day (QID) | INTRAVENOUS | Status: DC | PRN

## 2021-10-01 MED ORDER — MEPERIDINE HCL 50 MG/ML IJ SOLN
6.2500 mg | INTRAMUSCULAR | Status: DC | PRN
  Administered 2021-10-01: 12.5 mg via INTRAVENOUS

## 2021-10-01 MED ORDER — SUGAMMADEX SODIUM 200 MG/2ML IV SOLN
INTRAVENOUS | Status: DC | PRN
  Administered 2021-10-01: 400 mg via INTRAVENOUS

## 2021-10-01 MED ORDER — AMISULPRIDE (ANTIEMETIC) 5 MG/2ML IV SOLN: 10.0000 mg | Freq: Once | INTRAVENOUS | Status: DC | PRN

## 2021-10-01 MED ORDER — MIDAZOLAM HCL 2 MG/2ML IJ SOLN
INTRAMUSCULAR | Status: AC
  Filled 2021-10-01: qty 2

## 2021-10-01 MED ORDER — HYDROMORPHONE HCL 1 MG/ML IJ SOLN: 0.2500 mg | INTRAMUSCULAR | Status: DC | PRN

## 2021-10-01 MED ORDER — METOCLOPRAMIDE HCL 5 MG/ML IJ SOLN
10.0000 mg | Freq: Four times a day (QID) | INTRAMUSCULAR | Status: DC
  Administered 2021-10-01 – 2021-10-03 (×7): 10 mg via INTRAVENOUS
  Filled 2021-10-01 (×7): qty 2

## 2021-10-01 MED ORDER — FLUOXETINE HCL 20 MG PO CAPS
20.0000 mg | ORAL_CAPSULE | Freq: Every day | ORAL | Status: DC
  Administered 2021-10-02 – 2021-10-03 (×2): 20 mg via ORAL
  Filled 2021-10-01 (×3): qty 1

## 2021-10-01 MED ORDER — BUPIVACAINE LIPOSOME 1.3 % IJ SUSP: 20.0000 mL | Freq: Once | INTRAMUSCULAR | Status: DC

## 2021-10-01 MED ORDER — MEPERIDINE HCL 50 MG/ML IJ SOLN
INTRAMUSCULAR | Status: AC
  Filled 2021-10-01: qty 1

## 2021-10-01 MED ORDER — PHENYLEPHRINE HCL-NACL 20-0.9 MG/250ML-% IV SOLN
INTRAVENOUS | Status: DC | PRN
  Administered 2021-10-01: 25 ug/min via INTRAVENOUS

## 2021-10-01 MED ORDER — ORAL CARE MOUTH RINSE: 15.0000 mL | Freq: Once | OROMUCOSAL | Status: AC

## 2021-10-01 MED ORDER — HYDROMORPHONE HCL 1 MG/ML IJ SOLN
0.5000 mg | INTRAMUSCULAR | Status: DC | PRN
  Administered 2021-10-01: 0.5 mg via INTRAVENOUS
  Filled 2021-10-01: qty 0.5

## 2021-10-01 MED ORDER — PANTOPRAZOLE SODIUM 40 MG IV SOLR
40.0000 mg | Freq: Every day | INTRAVENOUS | Status: DC
  Administered 2021-10-01 – 2021-10-02 (×2): 40 mg via INTRAVENOUS
  Filled 2021-10-01 (×2): qty 10

## 2021-10-01 MED ORDER — BUPIVACAINE-EPINEPHRINE (PF) 0.25% -1:200000 IJ SOLN
INTRAMUSCULAR | Status: AC
  Filled 2021-10-01: qty 30

## 2021-10-01 MED ORDER — KETAMINE HCL 10 MG/ML IJ SOLN
INTRAMUSCULAR | Status: DC | PRN
  Administered 2021-10-01: 50 mg via INTRAVENOUS

## 2021-10-01 MED ORDER — HEPARIN SODIUM (PORCINE) 5000 UNIT/ML IJ SOLN
5000.0000 [IU] | INTRAMUSCULAR | Status: AC
  Administered 2021-10-01: 5000 [IU] via SUBCUTANEOUS
  Filled 2021-10-01: qty 1

## 2021-10-01 MED ORDER — MIDAZOLAM HCL 5 MG/5ML IJ SOLN
INTRAMUSCULAR | Status: DC | PRN
  Administered 2021-10-01: 2 mg via INTRAVENOUS

## 2021-10-01 MED ORDER — KETAMINE HCL 50 MG/5ML IJ SOSY
PREFILLED_SYRINGE | INTRAMUSCULAR | Status: AC
  Filled 2021-10-01: qty 5

## 2021-10-01 MED ORDER — ACETAMINOPHEN 160 MG/5ML PO SOLN: 1000.0000 mg | Freq: Three times a day (TID) | ORAL | Status: DC

## 2021-10-01 SURGICAL SUPPLY — 72 items
APPLIER CLIP ROT 10 11.4 M/L (STAPLE)
APPLIER CLIP ROT 13.4 12 LRG (CLIP)
BAG COUNTER SPONGE SURGICOUNT (BAG) IMPLANT
BAG LAPAROSCOPIC 12 15 PORT 16 (BASKET) IMPLANT
BAG RETRIEVAL 12/15 (BASKET) ×3
BENZOIN TINCTURE PRP APPL 2/3 (GAUZE/BANDAGES/DRESSINGS) ×3 IMPLANT
BLADE SURG SZ11 CARB STEEL (BLADE) ×3 IMPLANT
BNDG ADH 1X3 SHEER STRL LF (GAUZE/BANDAGES/DRESSINGS) ×18 IMPLANT
CABLE HIGH FREQUENCY MONO STRZ (ELECTRODE) IMPLANT
CHLORAPREP W/TINT 26 (MISCELLANEOUS) ×6 IMPLANT
CLIP APPLIE ROT 10 11.4 M/L (STAPLE) IMPLANT
CLIP APPLIE ROT 13.4 12 LRG (CLIP) IMPLANT
COVER SURGICAL LIGHT HANDLE (MISCELLANEOUS) ×3 IMPLANT
DEVICE SUT QUICK LOAD TK 5 (SUTURE) IMPLANT
DEVICE SUT TI-KNOT TK 5X26 (SUTURE) IMPLANT
DRAPE UTILITY XL STRL (DRAPES) ×6 IMPLANT
ELECT REM PT RETURN 15FT ADLT (MISCELLANEOUS) ×3 IMPLANT
GAUZE SPONGE 4X4 12PLY STRL (GAUZE/BANDAGES/DRESSINGS) IMPLANT
GLOVE BIO SURGEON STRL SZ 6 (GLOVE) ×3 IMPLANT
GLOVE INDICATOR 6.5 STRL GRN (GLOVE) ×3 IMPLANT
GLOVE SS BIOGEL STRL SZ 6 (GLOVE) ×2 IMPLANT
GLOVE SUPERSENSE BIOGEL SZ 6 (GLOVE) ×1
GOWN STRL REUS W/ TWL LRG LVL3 (GOWN DISPOSABLE) ×2 IMPLANT
GOWN STRL REUS W/ TWL XL LVL3 (GOWN DISPOSABLE) IMPLANT
GOWN STRL REUS W/TWL LRG LVL3 (GOWN DISPOSABLE) ×1
GOWN STRL REUS W/TWL XL LVL3 (GOWN DISPOSABLE)
GRASPER SUT TROCAR 14GX15 (MISCELLANEOUS) ×3 IMPLANT
IRRIG SUCT STRYKERFLOW 2 WTIP (MISCELLANEOUS) ×3
IRRIGATION SUCT STRKRFLW 2 WTP (MISCELLANEOUS) ×2 IMPLANT
KIT BASIN OR (CUSTOM PROCEDURE TRAY) ×3 IMPLANT
KIT TURNOVER KIT A (KITS) ×1 IMPLANT
MARKER SKIN DUAL TIP RULER LAB (MISCELLANEOUS) ×3 IMPLANT
MAT PREVALON FULL STRYKER (MISCELLANEOUS) ×3 IMPLANT
NDL SPNL 22GX3.5 QUINCKE BK (NEEDLE) ×2 IMPLANT
NEEDLE SPNL 22GX3.5 QUINCKE BK (NEEDLE) ×3 IMPLANT
PACK UNIVERSAL I (CUSTOM PROCEDURE TRAY) ×3 IMPLANT
RELOAD ENDO STITCH (ENDOMECHANICALS) IMPLANT
RELOAD STAPLE 60 3.6 BLU REG (STAPLE) ×2 IMPLANT
RELOAD STAPLE 60 3.8 GOLD REG (STAPLE) ×2 IMPLANT
RELOAD STAPLE 60 4.1 GRN THCK (STAPLE) IMPLANT
RELOAD STAPLER BLUE 60MM (STAPLE) ×10 IMPLANT
RELOAD STAPLER GOLD 60MM (STAPLE) ×4 IMPLANT
RELOAD STAPLER GREEN 60MM (STAPLE) IMPLANT
RELOAD SUT TRIPLE-STITCH 2-0 (ENDOMECHANICALS) IMPLANT
SCISSORS LAP 5X45 EPIX DISP (ENDOMECHANICALS) ×3 IMPLANT
SET TUBE SMOKE EVAC HIGH FLOW (TUBING) ×3 IMPLANT
SHEARS HARMONIC ACE PLUS 45CM (MISCELLANEOUS) ×3 IMPLANT
SLEEVE ADV FIXATION 5X100MM (TROCAR) ×6 IMPLANT
SLEEVE GASTRECTOMY 40FR VISIGI (MISCELLANEOUS) ×3 IMPLANT
SOL ANTI FOG 6CC (MISCELLANEOUS) ×2 IMPLANT
SOLUTION ANTI FOG 6CC (MISCELLANEOUS) ×1
SPIKE FLUID TRANSFER (MISCELLANEOUS) ×3 IMPLANT
STAPLE LINE REINFORCEMENT LAP (STAPLE) ×4 IMPLANT
STAPLER ECHELON LONG 3000 60 (ENDOMECHANICALS) ×1 IMPLANT
STAPLER RELOAD BLUE 60MM (STAPLE) ×15
STAPLER RELOAD GOLD 60MM (STAPLE) ×6
STAPLER RELOAD GREEN 60MM (STAPLE)
STRIP CLOSURE SKIN 1/2X4 (GAUZE/BANDAGES/DRESSINGS) ×3 IMPLANT
SUT MNCRL AB 4-0 PS2 18 (SUTURE) ×3 IMPLANT
SUT SURGIDAC NAB ES-9 0 48 120 (SUTURE) IMPLANT
SUT VICRYL 0 TIES 12 18 (SUTURE) ×3 IMPLANT
SYR 10ML ECCENTRIC (SYRINGE) ×3 IMPLANT
SYR 20ML LL LF (SYRINGE) ×3 IMPLANT
SYR 50ML LL SCALE MARK (SYRINGE) ×3 IMPLANT
SYS KII OPTICAL ACCESS 15MM (TROCAR) ×3
SYSTEM KII OPTICAL ACCESS 15MM (TROCAR) ×2 IMPLANT
TOWEL OR 17X26 10 PK STRL BLUE (TOWEL DISPOSABLE) ×3 IMPLANT
TOWEL OR NON WOVEN STRL DISP B (DISPOSABLE) ×3 IMPLANT
TROCAR ADV FIXATION 5X100MM (TROCAR) ×3 IMPLANT
TROCAR Z-THREAD OPTICAL 5X100M (TROCAR) ×3 IMPLANT
TUBING CONNECTING 10 (TUBING) ×3 IMPLANT
TUBING ENDO SMARTCAP (MISCELLANEOUS) ×3 IMPLANT

## 2021-10-01 NOTE — Interval H&P Note (Signed)
History and Physical Interval Note:  10/01/2021 12:28 PM  Alicia Wood  has presented today for surgery, with the diagnosis of MORBID OBESITY.  The various methods of treatment have been discussed with the patient and family. After consideration of risks, benefits and other options for treatment, the patient has consented to  Procedure(s): LAPAROSCOPIC SLEEVE GASTRECTOMY (N/A) UPPER GI ENDOSCOPY (N/A) as a surgical intervention.  The patient's history has been reviewed, patient examined, no change in status, stable for surgery.  I have reviewed the patient's chart and labs.  Questions were answered to the patient's satisfaction.     Avian Konigsberg Lollie Sails

## 2021-10-01 NOTE — Transfer of Care (Signed)
Immediate Anesthesia Transfer of Care Note  Patient: Alicia Wood  Procedure(s) Performed: LAPAROSCOPIC SLEEVE GASTRECTOMY (Abdomen) UPPER GI ENDOSCOPY (Esophagus)  Patient Location: PACU  Anesthesia Type:General  Level of Consciousness: awake, alert  and oriented  Airway & Oxygen Therapy: Patient Spontanous Breathing and Patient connected to face mask oxygen  Post-op Assessment: Report given to RN and Post -op Vital signs reviewed and stable  Post vital signs: Reviewed and stable  Last Vitals:  Vitals Value Taken Time  BP 126/75 10/01/21 1457  Temp    Pulse 72 10/01/21 1459  Resp 22 10/01/21 1459  SpO2 100 % 10/01/21 1459  Vitals shown include unvalidated device data.  Last Pain:  Vitals:   10/01/21 1154  TempSrc:   PainSc: 0-No pain      Patients Stated Pain Goal: 4 (10/01/21 1154)  Complications: No notable events documented.

## 2021-10-01 NOTE — Progress Notes (Signed)
PHARMACY CONSULT FOR:  Risk Assessment for Post-Discharge VTE Following Bariatric Surgery  Post-Discharge VTE Risk Assessment: This patient's probability of 30-day post-discharge VTE is increased due to the factors marked:  Sleeve gastrectomy   Liver disorder (transplant, cirrhosis, or nonalcoholic steatohepatitis)   Hx of VTE   Hemorrhage requiring transfusion   GI perforation, leak, or obstruction   ====================================================    Female    Age >/=60 years    BMI >/=50 kg/m2  *  CHF (new Dx or Sx within past 30 days)    Dyspnea at Rest    Paraplegia  X  Non-gastric-band surgery    Operation Time >/=3 hr    Return to OR     Length of Stay >/= 3 d   Hypercoagulable condition   Significant venous stasis    Predicted probability of 30-day post-discharge VTE: 0.16%  Other patient-specific factors to consider: Hx CHF but symptoms controlled and stable; no changes in past 30d per patient.   Recommendation for Discharge: No pharmacologic prophylaxis post-discharge Follow daily and recalculate estimated 30d VTE risk if returns to OR or LOS reaches 3 days.   Alicia Wood is a 51 y.o. female who underwent laparoscopic sleeve gastrectomy on 10/01/21   Case start: 1344 Case end: 1445   Allergies  Allergen Reactions   Latex Hives    Powdered     Patient Measurements: Height: 5' 8.75" (174.6 cm) Weight: (!) 136.5 kg (301 lb) IBW/kg (Calculated) : 65.63 Body mass index is 44.77 kg/m.  No results for input(s): "WBC", "HGB", "HCT", "PLT", "APTT", "CREATININE", "LABCREA", "CREAT24HRUR", "MG", "PHOS", "ALBUMIN", "PROT", "AST", "ALT", "ALKPHOS", "BILITOT", "BILIDIR", "IBILI" in the last 72 hours. Estimated Creatinine Clearance: 117.7 mL/min (by C-G formula based on SCr of 0.82 mg/dL).  Past Medical History:  Diagnosis Date   Back pain    Chest pain    a. Initially in setting of pericarditis in 11/2015; b. Ongoing cp despite prednisone,  colchicine, ibuprofen,and narcotic therapy;  c. 12/2015 CTA Chest: No PE, stable small-mod pericardial effusion;  d. 12/2015 Cath: nl cors.   Chest pain    Chronic diastolic CHF (congestive heart failure) (HCC)    a. 11/2015 Echo: EF 55-60%, grade 2 diastolic dysfxn.   Depression    GERD (gastroesophageal reflux disease)    Headache    Hyperlipidemia    Hypertension    Patient states she does not have hypertension.    Osteoarthritis    Pericardial effusion    a. 11/23/2015 Echo: mod pericardial effusion, no tamponade; b. 12/05/2015 Echo: small circumferential pericardial effusion; c. 12/06/2015 Cardiac MRI: pericardial thickening w/ circumferential late gad enhancement of pericardium and epicardium-->suspicious for subacute effusive constrictive pericarditis;  d. 12/19/2015 Echo: triv pericardial effusion;  e. 01/06/2016 CT Chest: small to mod pericard eff.   Pericarditis    a. 11/2015 Cardiac MRI; EF 57%, mild MR, triv TR, mild pericardial effusion, thickening of pericardium focally up to 50mm w/ significant circumferential late gad enhancement of pericardium and epicardium sugg of subacute effusive constrictive pericarditis;  b. 12/2015 R Heart Cath: nl right heart pressures w/o evidence of constrictive physiology.   Torn meniscus     Medications Prior to Admission  Medication Sig Dispense Refill Last Dose   acetaminophen (TYLENOL) 650 MG CR tablet Take 650 mg by mouth every 8 (eight) hours as needed for pain.   09/30/2021   Colchicine 0.6 MG CAPS TAKE 1 CAPSULE BY MOUTH TWO TIMES DAILY FOR 14 DAYS 28 capsule 0 Past Month  FLUoxetine (PROZAC) 20 MG capsule Take 1 capsule by mouth once daily 90 capsule 3 Past Month   rosuvastatin (CRESTOR) 20 MG tablet Take 1 tablet (20 mg total) by mouth daily. 90 tablet 3 Past Month     Bernadene Person, PharmD, BCPS [phone] 10/01/2021, 3:10 PM

## 2021-10-01 NOTE — Anesthesia Procedure Notes (Signed)
Procedure Name: Intubation Date/Time: 10/01/2021 1:26 PM  Performed by: Maxwell Caul, CRNAPre-anesthesia Checklist: Patient identified, Emergency Drugs available, Suction available and Patient being monitored Patient Re-evaluated:Patient Re-evaluated prior to induction Oxygen Delivery Method: Circle system utilized Preoxygenation: Pre-oxygenation with 100% oxygen Induction Type: IV induction Ventilation: Mask ventilation without difficulty Laryngoscope Size: Mac and 4 Grade View: Grade I Tube type: Oral Tube size: 7.0 mm Number of attempts: 1 Airway Equipment and Method: Stylet Placement Confirmation: ETT inserted through vocal cords under direct vision, positive ETCO2 and breath sounds checked- equal and bilateral Secured at: 22 cm Tube secured with: Tape Dental Injury: Teeth and Oropharynx as per pre-operative assessment

## 2021-10-01 NOTE — Op Note (Signed)
Alicia Wood [id number] Jan 03, 1971 10/01/2021  Preoperative diagnosis: lap sleeve gastrectomy in progress  Postoperative diagnosis: Same   Procedure: Upper endoscopy   Surgeon: Susy Frizzle B. Daphine Deutscher  M.D., FACS   Anesthesia: Gen.   Indications for procedure: This patient was undergoing a sleeve gastrectomy by Dr. Fredricka Bonine.    Description of procedure: The endoscopy was placed in the mouth (patient had a missing tooth noted) and into the oropharynx and under endoscopic vision it was advanced to the esophagogastric junction.  The pouch was insufflated and the sleeve was cylindrical and went down to the antrum.   No bleeding or leaks were detected.  The scope was withdrawn without difficulty.     Matt B. Daphine Deutscher, MD, FACS General, Bariatric, & Minimally Invasive Surgery [facility], [place]

## 2021-10-01 NOTE — Op Note (Signed)
Operative Note  Alicia Wood  [id number]  [id number]  10/01/2021   Surgeon: Phylliss Blakes MD FACS   Assistant: Wenda Low MD FACS   Procedure performed: laparoscopic sleeve gastrectomy, upper endoscopy   Preop diagnosis: Morbid obesity Body mass index is 44.77 kg/m. Post-op diagnosis/intraop findings: same   Specimens: fundus Retained items: none  EBL: minimal  Complications: none   Description of procedure: After obtaining informed consent and administration of chemical DVT prophylaxis in holding, the patient was taken to the operating room and placed supine on operating room table where general endotracheal anesthesia was initiated, preoperative antibiotics were administered, SCDs applied, and a formal timeout was performed. The abdomen was prepped and draped in usual sterile fashion. Peritoneal access was gained using a Visiport technique in the left upper quadrant and insufflation to 15 mmHg ensued without issue. Gross inspection revealed no evidence of injury. Extensive omental adhesions to the anterior abdominal wall are noted along the central and lower abdomen, extending up to the level of the falciform. These were carefully taken town from the more cephalad abdominal wall with the harmonic until sufficient room was present for the remaining trocars.  Under direct visualization three more 5 mm trochars were placed in the right and left hemiabdomen and the 30mm trocar in the right paramedian upper abdomen. Bilateral laparoscopic assisted TAPS blocks were performed with Exparel diluted with 0.25 percent Marcaine with epinephrine. The patient was placed in steep reverseTrendelenburg and the liver retractor was introduced through an incision in the upper midline and secured to the post externally to maintain the left lobe retracted anteriorly.  There is no hiatal hernia on direct inspection.  Using the Harmonic scalpel, the greater curvature of the stomach was dissected away  from the greater omentum and short gastric vessels were divided. This began 6 cm from the pylorus, and dissection proceeded until the left crus was clearly exposed. The 32 [place] VisiGi was then introduced and directed down towards the pylorus. This was placed to suction against the lesser curve. Serial fires of the linear cutting stapler with staple line reinforcements were then employed to create our sleeve. The first two fires used a gold load and ensured adequate room at the angularis incisura. Several blue loads were then employed to create a narrow tubular stomach preserving 1cm lateral to the angle of His. The excised stomach was then removed through our 15 mm trocar site within an Endo Catch bag.  The visigi was taken off of suction and a few puffs of air were introduced, inflating the sleeve. No bubbles were observed in the irrigation fluid around the stomach and the shape was noted to be evenly tubular without any narrowing at the angularis. The visigi was then removed. Upper endoscopy was performed by the assistant surgeon and the sleeve was noted to be airtight, the staple line was hemostatic. Please see his separate note. The endoscope was removed. A small amount of oozing on the staple line was addressed with clips. The 15 mm trocar site fascia in the right upper abdomen was closed with a 0 Vicryl using the laparoscopic suture passer under direct visualization. The liver retractor was removed under direct visualization. Some oozing at this incision intra-abdominally was controlled with a vicryl suture applied with the PMI. The abdomen was then again surveyed and hemostasis confirmed. The abdomen was then desufflated and all remaining trochars removed. The skin incisions were closed with subcuticular Monocryl; benzoin, Steri-Strips and Band-Aids were applied The patient was then awakened, extubated and  taken to PACU in stable condition.     All counts were correct at the completion of the case.

## 2021-10-01 NOTE — Anesthesia Preprocedure Evaluation (Addendum)
Anesthesia Evaluation  Patient identified by MRN, date of birth, ID band Patient awake    Reviewed: Allergy & Precautions, NPO status , Patient's Chart, lab work & pertinent test results  Airway Mallampati: II  TM Distance: >3 FB Neck ROM: Full    Dental  (+) Dental Advisory Given, Missing, Partial Upper,    Pulmonary neg pulmonary ROS,    Pulmonary exam normal breath sounds clear to auscultation       Cardiovascular hypertension, pulmonary hypertension+CHF   Rhythm:Regular Rate:Normal - Friction Rub Echo 2021 1. Left ventricular ejection fraction by 3D volume is 61 %. The left ventricle has normal function. The left ventricle has no regional wall motion abnormalities. Left ventricular diastolic parameters were normal. The average left ventricular global longitudinal strain is -22.4 %. The global longitudinal strain is normal.  2. Right ventricular systolic function is normal. The right ventricular size is normal. There is normal pulmonary artery systolic pressure. The estimated right ventricular systolic pressure is 31.2 mmHg.  3. Left atrial size was mildly dilated.  4. Trace pericardial fluid, posterior to the left ventricle. No definite features of constrictive physiology.  5. The mitral valve is normal in structure. Trivial mitral valve regurgitation. No evidence of mitral stenosis.  6. The aortic valve is tricuspid. Aortic valve regurgitation is trivial. No aortic stenosis is present.  7. The inferior vena cava is dilated in size with >50% respiratory variability, suggesting right atrial pressure of 8 mmHg.    Neuro/Psych  Headaches, PSYCHIATRIC DISORDERS Depression    GI/Hepatic Neg liver ROS, GERD  ,  Endo/Other  Morbid obesity  Renal/GU negative Renal ROS     Musculoskeletal  (+) Arthritis ,   Abdominal (+) + obese,   Peds  Hematology negative hematology ROS (+)   Anesthesia Other Findings    Reproductive/Obstetrics negative OB ROS                            Anesthesia Physical Anesthesia Plan  ASA: 3  Anesthesia Plan: General   Post-op Pain Management: Tylenol PO (pre-op)*, Gabapentin PO (pre-op)*, Lidocaine infusion* and Ketamine IV*   Induction: Intravenous  PONV Risk Score and Plan: 4 or greater and Ondansetron, Dexamethasone, Treatment may vary due to age or medical condition, Midazolam and Aprepitant  Airway Management Planned: Oral ETT  Additional Equipment:   Intra-op Plan:   Post-operative Plan: Extubation in OR  Informed Consent: I have reviewed the patients History and Physical, chart, labs and discussed the procedure including the risks, benefits and alternatives for the proposed anesthesia with the patient or authorized representative who has indicated his/her understanding and acceptance.     Dental advisory given  Plan Discussed with: CRNA  Anesthesia Plan Comments:       Anesthesia Quick Evaluation

## 2021-10-01 NOTE — Anesthesia Postprocedure Evaluation (Signed)
Anesthesia Post Note  Patient: Alicia Wood  Procedure(s) Performed: LAPAROSCOPIC SLEEVE GASTRECTOMY (Abdomen) UPPER GI ENDOSCOPY (Esophagus)     Patient location during evaluation: PACU Anesthesia Type: General Level of consciousness: sedated and patient cooperative Pain management: pain level controlled Vital Signs Assessment: post-procedure vital signs reviewed and stable Respiratory status: spontaneous breathing Cardiovascular status: stable Anesthetic complications: no   No notable events documented.  Last Vitals:  Vitals:   10/01/21 1851 10/01/21 2004  BP: 117/87 130/75  Pulse: (!) 53 (!) 58  Resp: 18 18  Temp: 36.7 C   SpO2: 100% 100%    Last Pain:  Vitals:   10/01/21 1755  TempSrc: Oral  PainSc:                  Lewie Loron

## 2021-10-01 NOTE — Progress Notes (Signed)
Patient was very sleepy after admission. She started her water at 1800 and will work on incentive spirometer and walk when the pain is under control and when she can keep her eyes open.

## 2021-10-02 ENCOUNTER — Encounter (HOSPITAL_COMMUNITY): Payer: Self-pay | Admitting: Surgery

## 2021-10-02 ENCOUNTER — Other Ambulatory Visit (HOSPITAL_COMMUNITY): Payer: Self-pay

## 2021-10-02 LAB — CBC
HCT: 35.8 % — ABNORMAL LOW (ref 36.0–46.0)
Hemoglobin: 11.6 g/dL — ABNORMAL LOW (ref 12.0–15.0)
MCH: 28.5 pg (ref 26.0–34.0)
MCHC: 32.4 g/dL (ref 30.0–36.0)
MCV: 88 fL (ref 80.0–100.0)
Platelets: 299 10*3/uL (ref 150–400)
RBC: 4.07 MIL/uL (ref 3.87–5.11)
RDW: 13.6 % (ref 11.5–15.5)
WBC: 9.2 10*3/uL (ref 4.0–10.5)
nRBC: 0 % (ref 0.0–0.2)

## 2021-10-02 LAB — COMPREHENSIVE METABOLIC PANEL
ALT: 17 U/L (ref 0–44)
AST: 19 U/L (ref 15–41)
Albumin: 3.7 g/dL (ref 3.5–5.0)
Alkaline Phosphatase: 80 U/L (ref 38–126)
Anion gap: 9 (ref 5–15)
BUN: 10 mg/dL (ref 6–20)
CO2: 27 mmol/L (ref 22–32)
Calcium: 9.3 mg/dL (ref 8.9–10.3)
Chloride: 105 mmol/L (ref 98–111)
Creatinine, Ser: 0.74 mg/dL (ref 0.44–1.00)
GFR, Estimated: >60 mL/min (ref >60)
Glucose, Bld: 121 mg/dL — ABNORMAL HIGH (ref 70–99)
Potassium: 4.1 mmol/L (ref 3.5–5.1)
Sodium: 141 mmol/L (ref 135–145)
Total Bilirubin: 0.4 mg/dL (ref 0.3–1.2)
Total Protein: 7.2 g/dL (ref 6.5–8.1)

## 2021-10-02 LAB — SURGICAL PATHOLOGY

## 2021-10-02 LAB — MAGNESIUM: Magnesium: 2.1 mg/dL (ref 1.7–2.4)

## 2021-10-02 MED ORDER — TRAMADOL HCL 50 MG PO TABS
50.0000 mg | ORAL_TABLET | Freq: Four times a day (QID) | ORAL | 0 refills | Status: DC | PRN
  Filled 2021-10-02: qty 10, 3d supply, fill #0

## 2021-10-02 MED ORDER — METHOCARBAMOL 1000 MG/10ML IJ SOLN
500.0000 mg | Freq: Once | INTRAVENOUS | Status: AC
  Administered 2021-10-02: 500 mg via INTRAVENOUS
  Filled 2021-10-02: qty 500

## 2021-10-02 MED ORDER — GABAPENTIN 100 MG PO CAPS
200.0000 mg | ORAL_CAPSULE | Freq: Two times a day (BID) | ORAL | 0 refills | Status: DC
  Filled 2021-10-02: qty 20, 5d supply, fill #0

## 2021-10-02 MED ORDER — PANTOPRAZOLE SODIUM 40 MG PO TBEC
40.0000 mg | DELAYED_RELEASE_TABLET | Freq: Every day | ORAL | 0 refills | Status: AC
  Filled 2021-10-02: qty 90, 90d supply, fill #0

## 2021-10-02 MED ORDER — DOCUSATE SODIUM 100 MG PO CAPS
100.0000 mg | ORAL_CAPSULE | Freq: Two times a day (BID) | ORAL | 0 refills | Status: AC
  Filled 2021-10-02: qty 30, 15d supply, fill #0

## 2021-10-02 MED ORDER — ACETAMINOPHEN 500 MG PO TABS: 1000.0000 mg | ORAL_TABLET | Freq: Three times a day (TID) | ORAL | 0 refills | Status: AC

## 2021-10-02 MED ORDER — ONDANSETRON 4 MG PO TBDP
4.0000 mg | ORAL_TABLET | Freq: Four times a day (QID) | ORAL | 0 refills | Status: DC | PRN
  Filled 2021-10-02: qty 20, 5d supply, fill #0

## 2021-10-02 NOTE — Consult Note (Signed)
   [facility] Inpatient Consult   10/02/2021  Alicia Wood [birth year] [id number]   [facility] [[facility]]  Accountable Care Organization [ACO] Patient: [name] Employee Plan  *Remote coverage for review as patient is at [employer], call attempts to patient's room and to cell phone on file, was able to leave a HIPAA approved voicemail requesting a return call.  Primary Care Provider:  Shon Hale, MD, Deboraha Sprang at [facility] is the provider  Referral  request for [facility] plan for disease management and community resource support for post hospital follow up.         Plan: Will assign for a referral request for post hospital follow up wth [facility] RNCM.   For additional questions or referrals please contact:   Charlesetta Shanks, RN BSN CCM [facility]  [phone] business mobile phone Toll free office [phone]  Fax number: [phone] [email or web address]

## 2021-10-02 NOTE — Progress Notes (Signed)
Transition of Care [facility]) Screening Note  Patient Details  Name: Alicia Wood Date of Birth: 01-07-1971  Transition of Care [facility]) CM/SW Contact:    Ewing Schlein, LCSW Phone Number: 10/02/2021, 9:41 AM  Transition of Care Department [facility]) has reviewed patient and no TOC needs have been identified at this time. We will continue to monitor patient advancement through interdisciplinary progression rounds. If new patient transition needs arise, please place a TOC consult.

## 2021-10-02 NOTE — Discharge Summary (Signed)
Physician Discharge Summary  Alicia Wood SEG:[id number] DOB: 05/19/1970 DOA: 10/01/2021  PCP: Glenis Smoker, MD  Admit date: 10/01/2021 Discharge date: 10/03/2021   Recommendations for Outpatient Follow-up:    Follow-up Information     Alicia Riley, MD. Go on 10/24/2021.   Specialty: General Surgery Why: at 11:30am.  Please arrive 15 minutes prior to your appointment time.  Thank you. Contact information: [address] [place] [address] [phone]         Alicia Riley, MD. Go on 11/19/2021.   Specialty: General Surgery Why: at 3pm.  Please arrive 15 minutes prior to your appointment time.  Thank you. Contact information: [address] [place] [address] [phone]                Discharge Diagnoses:  Principal Problem:   Morbid obesity ([place])   Surgical Procedure: Laparoscopic Sleeve Gastrectomy, upper endoscopy  Discharge Condition: Good Disposition: Home  Diet recommendation: Postoperative sleeve gastrectomy diet (liquids only)  Filed Weights   10/01/21 1046 10/01/21 1154  Weight: (!) 136.8 kg (!) 136.5 kg     Hospital Course:  The patient was admitted for a planned laparoscopic sleeve gastrectomy. Please see operative note. Preoperatively the patient was given 5000 units of subcutaneous heparin for DVT prophylaxis. ERAS protocol was used. On the evening of postoperative day 0, the patient was started on water and ice chips. On postoperative day 1 the patient had no fever or tachycardia and was tolerating water in their diet was gradually advanced throughout the day. The patient was ambulating without difficulty. Their vital signs are stable without fever or tachycardia.  She had a fair amount of postop gas pain, but was reluctant to take any narcotic medications. This subsided by post-op day 2 and she was able to drink a full cup of protein broth throughout the day. The patient had  received discharge instructions and counseling. They were deemed stable for discharge and had met discharge criteria  Today's Vitals   10/02/21 2115 10/03/21 0116 10/03/21 0130 10/03/21 0414  BP:   (!) 173/88 (!) 150/99  Pulse:   60 (!) 58  Resp:   18 18  Temp:   98.5 F (36.9 C) 98.6 F (37 C)  TempSrc:   Oral Oral  SpO2:   100% 99%  Weight:      Height:      PainSc: 3  Asleep     Body mass index is 44.77 kg/m.   Alert, well-appearing Unlabored respirations Abdomen soft, nontender, nondistended.  Incisions clean dry intact with Steri-Strips/Band-Aids, no cellulitis or hematoma No lower extremity edema   Discharge Instructions  Discharge Instructions     AMB Referral to Community Care Coordinaton   Complete by: As directed    CHEP:  please assign appropriate POD for follow up  PCP is Alicia Wood  Please assign to [place] Coordinator for follow up calls and assess for further needs.  Questions please call:   [name], RN BSN [facility] Liaison  [phone] business mobile phone Toll free office [phone]  Fax number: [phone] [email or web address]   Reason for Referral: Care Coordination   Disease managment services needed: Nurse Case Manager   Diagnoses of: Other   Other Diagnosis: Post hospital follow up   Expected date of contact: Emergent - 3 Days      Allergies as of 10/03/2021       Reactions  Latex Hives   Powdered         Medication List     STOP taking these medications    acetaminophen 650 MG CR tablet Commonly known as: TYLENOL Replaced by: acetaminophen 500 MG tablet   Colchicine 0.6 MG Caps       TAKE these medications    acetaminophen 500 MG tablet Commonly known as: TYLENOL Take 2 tablets (1,000 mg total) by mouth every 8 (eight) hours for 5 days. Replaces: acetaminophen 650 MG CR tablet   docusate sodium 100 MG capsule Commonly  known as: Colace Take 1 capsule (100 mg total) by mouth 2 (two) times daily. Okay to decrease to once daily or stop taking if having loose bowel movements   FLUoxetine 20 MG capsule Commonly known as: PROZAC Take 1 capsule by mouth once daily   gabapentin 100 MG capsule Commonly known as: NEURONTIN Take 2 capsules (200 mg total) by mouth every 12 (twelve) hours.   ondansetron 4 MG disintegrating tablet Commonly known as: ZOFRAN-ODT Dissolve 1 tablet (4 mg total) by mouth every 6 (six) hours as needed for nausea or vomiting.   pantoprazole 40 MG tablet Commonly known as: PROTONIX Take 1 tablet (40 mg total) by mouth daily. Take this medication daily, regardless of reflux symptoms   rosuvastatin 20 MG tablet Commonly known as: CRESTOR Take 1 tablet (20 mg total) by mouth daily.   traMADol 50 MG tablet Commonly known as: ULTRAM Take 1 tablet (50 mg total) by mouth every 6 (six) hours as needed (pain).        Follow-up Information     Alicia Riley, MD. Go on 10/24/2021.   Specialty: General Surgery Why: at 11:30am.  Please arrive 15 minutes prior to your appointment time.  Thank you. Contact information: [address] [name] [address] [phone]         Alicia Riley, MD. Go on 11/19/2021.   Specialty: General Surgery Why: at 3pm.  Please arrive 15 minutes prior to your appointment time.  Thank you. Contact information: [address] [place] [address] [phone]                  The results of significant diagnostics from this hospitalization (including imaging, microbiology, ancillary and laboratory) are listed below for reference.    Significant Diagnostic Studies: No results found.  Labs: Basic Metabolic Panel: Recent Labs  Lab 09/26/21 1011 10/02/21 0509  NA 141 141  K 3.9 4.1  CL 109 105  CO2 25 27  GLUCOSE 100* 121*  BUN 19 10  CREATININE 0.82 0.74  CALCIUM 9.4 9.3  MG  --   2.1   Liver Function Tests: Recent Labs  Lab 09/26/21 1011 10/02/21 0509  AST 18 19  ALT 16 17  ALKPHOS 80 80  BILITOT 0.5 0.4  PROT 7.1 7.2  ALBUMIN 3.8 3.7    CBC: Recent Labs  Lab 09/26/21 1011 10/02/21 0509  WBC 5.4 9.2  NEUTROABS 2.7  --   HGB 11.5* 11.6*  HCT 35.7* 35.8*  MCV 88.4 88.0  PLT 309 299    CBG: No results for input(s): "GLUCAP" in the last 168 hours.  Principal Problem:   Morbid obesity ([place])    Signed:  [facility], [place] [phone] 10/03/2021, 8:02 AM

## 2021-10-02 NOTE — Progress Notes (Signed)
Patient alert and oriented, pain is controlled. Patient is tolerating fluids, advanced to protein shake today, patient is tolerating well.  Reviewed Gastric sleeve discharge instructions with patient and patient is able to articulate understanding.  Provided information on BELT program, Support Group and [facility] outpatient pharmacy. All questions answered, will continue to monitor.  

## 2021-10-02 NOTE — Discharge Instructions (Signed)

## 2021-10-03 ENCOUNTER — Other Ambulatory Visit: Payer: Self-pay

## 2021-10-03 ENCOUNTER — Ambulatory Visit: Payer: 59 | Admitting: *Deleted

## 2021-10-03 ENCOUNTER — Telehealth: Payer: Self-pay | Admitting: *Deleted

## 2021-10-03 NOTE — Chronic Care Management (AMB) (Signed)
  Care Coordination  Note  10/03/2021 Name: Alicia Wood MRN: [id number] DOB: 1970-10-07  Alicia Wood is a 51 y.o. year old female who is a primary care patient of Shon Hale, MD. I reached out to Chauncey Reading by phone today to offer care coordination services.      Alicia Wood was given information about Care Coordination services today including:  The Care Coordination services include support from the care team which includes your Nurse Coordinator, Clinical Social Worker, or Pharmacist.  The Care Coordination team is here to help remove barriers to the health concerns and goals most important to you. Care Coordination services are voluntary and the patient may decline or stop services at any time by request to their care team member.   Patient agreed to services and verbal consent obtained.   Follow up plan: Telephone appointment with care coordination team member scheduled for:10/04/21  [facility] Coordination Care Guide  Direct Dial: [phone]

## 2021-10-03 NOTE — Patient Outreach (Signed)
[facility]) Care Management  10/03/2021  Alicia Wood 24-Jul-1970 [id number]  Telephonic: Patient called  Patient returned call today 9:55 am  Explained the nature for post hospital follow up and patient expressed understanding and will anticipate a follow up call from [facility] RNCM.  She states she is feeling pretty good today with no issues.  For questions,  Charlesetta Shanks, RN BSN CCM [facility]  [phone] business mobile phone Toll free office [phone]  Fax number: [phone] [name]

## 2021-10-03 NOTE — Patient Outreach (Signed)
[facility]) Care Management  10/03/2021  Voncile Schwarz 1970/09/27 [id number]   RN contacted pt today as scheduled however pt informed the call would be tomorrow at 1 pm not today. Apologized and will reach out to scheduler and request this pt for Friday at 1 pm for the initial assessment. No interventions or assessment performed at this time.  Elliot Cousin, RN Care Management Coordinator [facility] Main Office [phone]

## 2021-10-03 NOTE — Progress Notes (Signed)
Patient alert and oriented, Post op day 2.  Provided support and encouragement.  Encouraged pulmonary toilet, ambulation and small sips of liquids.  All questions answered.  Will continue to monitor. 

## 2021-10-04 ENCOUNTER — Telehealth (HOSPITAL_COMMUNITY): Payer: Self-pay | Admitting: *Deleted

## 2021-10-04 ENCOUNTER — Ambulatory Visit: Payer: Self-pay | Admitting: *Deleted

## 2021-10-04 ENCOUNTER — Encounter: Payer: Self-pay | Admitting: *Deleted

## 2021-10-04 NOTE — Patient Outreach (Signed)
  Care Coordination   Initial Visit Note   10/04/2021 Name: Gayanne Prescott MRN: [id number] DOB: [birth year]  Esther Bradstreet is a 51 y.o. year old female who sees Chanetta Marshall, Meridee Score, MD for primary care. I spoke with  Chauncey Reading by phone today  What matters to the patients health and wellness today?  Maintain all scheduled appointments.   Goals Addressed               This Visit's Progress     Maintain all scheduled appointments (pt-stated)        Care Coordination Interventions: Advised patient to Encourage pt to follow up with PCP for follow up appointment Reviewed scheduled/upcoming provider appointments including all related bariatric appointments (surgeon and bariatric classes).          SDOH assessments and interventions completed:   Yes SDOH Interventions Today    Flowsheet Row Most Recent Value  SDOH Interventions   Food Insecurity Interventions Intervention Not Indicated  Transportation Interventions Intervention Not Indicated       Care Coordination Interventions Activated:  Yes Care Coordination Interventions:   Yes, provided  Follow up plan: No further intervention required.  Encounter Outcome:  Pt. Visit Completed   Elliot Cousin, RN Care Management Coordinator [facility] Main Office [phone]

## 2021-10-05 NOTE — Telephone Encounter (Signed)
1.  Tell me about your pain and pain management? Pt denies any current pain.  Pt states that her pain is "here and there", but is tolerable, has not taken any pain medication.  2.  Let's talk about fluid intake.  How much total fluid are you taking in? Pt states that she is working to meet goal of 64 oz of fluid today.  Pt has been able to consume approx. 20oz of fluid today.  Discussed water goal for today.  Pt plans to get some Gatorade to aid in progressing toward daily fluid goal. Pt instructed to assess status and suggestions daily utilizing Hydration Action Plan on discharge folder and to call [facility] if in the "red zone".   3.  How much protein have you taken in the last 2 days? Pt states that she is working to meet goal of goal of 60g of protein today.  Pt has been able to consume one protein shake and protein powder added to broth.  Pt plans to get some greek yogurt to meet daily goal.   4.  Have you had nausea?  Tell me about when have experienced nausea and what you did to help? Pt denies nausea.   5.  Has the frequency or color changed with your urine? Pt states that she is urinating "fine" with no changes in frequency or urgency.     6.  Tell me what your incisions look like? "Incisions look fine". Pt denies a fever, chills.  Pt states incisions are not swollen, open, or draining.  Pt instructed to take off outer bandaids since pt has already showered. Pt encouraged to call [facility] if incisions change.   7.  Have you been passing gas? BM? Pt states that she has not had a BM.  Pt instructed to take either Miralax or MoM as instructed per "Gastric Bypass/Sleeve Discharge Home Care Instructions".  Pt to call surgeon's office if not able to have BM with medication.   8.  If a problem or question were to arise who would you call?  Do you know contact numbers for [facility], [facility], and [facility]? Pt denies dehydration symptoms.  Pt can describe s/sx of dehydration.  Pt knows to call [facility] for surgical, [facility] for  nutrition, and [facility] for non-urgent questions or concerns.   9.  How has the walking going? Pt states she is walking around and able to be active without difficulty.   10. Are you still using your incentive spirometer?  If so, how often? Pt states that she is doing the I.S. q1h.  Pt encouraged to continue to use incentive spirometer, at least 10x every hour while awake until she sees the surgeon.  11.  How are your vitamins and calcium going?  How are you taking them? Pt states that she will begin the supplements tomorrow.  Reinforced education about taking supplements at least two hours apart.  Reminded patient that the first 30 days post-operatively are important for successful recovery.  Practice good hand hygiene, wearing a mask when appropriate (since optional in most places), and minimizing exposure to people who live outside of the home, especially if they are exhibiting any respiratory, GI, or illness-like symptoms.

## 2021-10-15 ENCOUNTER — Encounter: Payer: 59 | Attending: Surgery | Admitting: Dietician

## 2021-10-15 ENCOUNTER — Encounter: Payer: Self-pay | Admitting: Dietician

## 2021-10-15 DIAGNOSIS — Z6841 Body Mass Index (BMI) 40.0 and over, adult: Secondary | ICD-10-CM | POA: Insufficient documentation

## 2021-10-15 DIAGNOSIS — Z713 Dietary counseling and surveillance: Secondary | ICD-10-CM | POA: Insufficient documentation

## 2021-10-15 NOTE — Progress Notes (Signed)
2 Week Post-Operative Nutrition Class   Patient was seen on 10/15/2021 for Post-Operative Nutrition education at the [facility].   Surgery date: 10/01/2021 Surgery type: sleeve  Anthropometrics  Start weight at [facility]: 279 lbs (date: 03/27/2021)  Height: 68 in Weight today: 285.1 pounds BMI: 42.72 kg/m2     Clinical  Medical hx: GERD, depression, hyperlipidemia , HTN, pericarditis, CHF, prediabetes Medications: see list Labs: BUN/creatinine ratio 27, LDL 111, iron saturation 12, A1C 6.4, vitamin D 23.8 Notable signs/symptoms: constipation, arthritis pain Any previous deficiencies? No   Body Composition Scale 10/15/2021  Current Body Weight 285.1  Total Body Fat % 46.6  Visceral Fat 18  Fat-Free Mass % 53.3   Total Body Water % 41.1  Muscle-Mass lbs 34.3  BMI 42.9  Body Fat Displacement          Torso  lbs 82.6         Left Leg  lbs 16.5         Right Leg  lbs 16.5         Left Arm  lbs 8.2         Right Arm   lbs 8.2     The following the learning objectives were met by the patient during this course: Identifies Phase 3 (Soft, High Proteins) Dietary Goals and will begin from 2 weeks post-operatively to 2 months post-operatively Identifies appropriate sources of fluids and proteins  Identifies appropriate fat sources and healthy verses unhealthy fat types   States protein recommendations and appropriate sources post-operatively Identifies the need for appropriate texture modifications, mastication, and bite sizes when consuming solids Identifies appropriate fat consumption and sources Identifies appropriate multivitamin and calcium sources post-operatively Describes the need for physical activity post-operatively and will follow MD recommendations States when to call healthcare provider regarding medication questions or post-operative complications   Handouts given during class include: Phase 3A: Soft, High Protein Diet Handout Phase 3 High  Protein Meals Healthy Fats   Follow-Up Plan: Patient will follow-up at [facility] in 6 weeks for 2 month post-op nutrition visit for diet advancement per MD.

## 2021-10-21 ENCOUNTER — Telehealth: Payer: Self-pay | Admitting: Dietician

## 2021-10-21 NOTE — Telephone Encounter (Signed)
RD called pt to verify fluid intake once starting soft, solid proteins 2 week post-bariatric surgery.   Daily Fluid intake: trying to drink water, hot tea with Murelax Daily Protein intake: doesn't want to eat due to no bowel movement.  Concerns/issues:  Bowel Habits: has not had BM in two weeks, she called surgeon and was told to take Murelax every 2 hours, and if she doesn't have a BM today, to call them back.

## 2021-10-24 ENCOUNTER — Other Ambulatory Visit (HOSPITAL_COMMUNITY): Payer: Self-pay

## 2021-10-24 MED ORDER — BISACODYL 10 MG RE SUPP
RECTAL | 0 refills | Status: DC
Start: 2021-10-24 — End: 2023-09-15
  Filled 2021-10-24: qty 10, 10d supply, fill #0

## 2021-10-24 MED ORDER — ENEMA 7-19 GM/118ML RE ENEM
ENEMA | RECTAL | 0 refills | Status: DC
Start: 2021-10-24 — End: 2023-09-15

## 2021-10-30 ENCOUNTER — Encounter (INDEPENDENT_AMBULATORY_CARE_PROVIDER_SITE_OTHER): Payer: Self-pay

## 2021-11-26 ENCOUNTER — Encounter: Payer: 59 | Attending: Surgery | Admitting: Dietician

## 2021-11-26 ENCOUNTER — Encounter: Payer: Self-pay | Admitting: Dietician

## 2021-11-26 NOTE — Progress Notes (Signed)
Bariatric Nutrition Follow-Up Visit Medical Nutrition Therapy  Appt Start Time: 11:25   End Time: 12:10  Surgery date: 10/01/2021 Surgery type: sleeve  NUTRITION ASSESSMENT    Anthropometrics  Start weight at [facility]: 279 lbs (date: 03/27/2021)  Height: 68 in Weight today: 285.1 pounds BMI: 42.72 kg/m2     Clinical  Medical hx: GERD, depression, hyperlipidemia , HTN, pericarditis, CHF, prediabetes Medications: see list Labs: BUN/creatinine ratio 27, LDL 111, iron saturation 12, A1C 6.4, vitamin D 23.8 Notable signs/symptoms: constipation, arthritis pain Any previous deficiencies? No   Body Composition Scale 10/15/2021 11/26/2021  Current Body Weight 285.1 273.8  Total Body Fat % 46.6 45.7  Visceral Fat 18 17  Fat-Free Mass % 53.3 54.2   Total Body Water % 41.1 41.6  Muscle-Mass lbs 34.3 34.2  BMI 42.9 41.1  Body Fat Displacement           Torso  lbs 82.6 77.7         Left Leg  lbs 16.5 15.5         Right Leg  lbs 16.5 15.5         Left Arm  lbs 8.2 7.7         Right Arm   lbs 8.2 7.7     Lifestyle & Dietary Hx  Pt states she has only had a bowel movement 3 times since surgery. Pt states it is hard to for her to drink water during the day, stating that the water causes her stomach to knot up.  Dietitian advised that patient try sugar free water enhancers and herbal teas to help increase water intake. Pt states she tries to meal prep now.  Pt states she gets full quickly and does not want to eat much.  Pt excited for more of a variety with addition of non-starchy vegetables. Pt works nights for one job and a part time job in the morning hours. Pt states she want to be under 200 lbs.   Estimated daily fluid intake: 32-64 oz Estimated daily protein intake: 60+ g Supplements: multi vitamin and calcium Current average weekly physical activity: 2.5 miles 3 times a week   24-Hr Dietary Recall First Meal: yogurt Snack: sugar-free pop cycle, sugar-free jello  Second Meal:  boiled egg, chicken or chicken salad or tuna or pork loin Snack:   Third Meal: [place] sausage, egg Snack:  Beverages: water  Post-Op Goals/ Signs/ Symptoms Using straws: no Drinking while eating: no Chewing/swallowing difficulties: no Changes in vision: no Changes to mood/headaches: no Hair loss/changes to skin/nails: no Difficulty focusing/concentrating: no Sweating: no Limb weakness: no Dizziness/lightheadedness: no Palpitations: no  Carbonated/caffeinated beverages: no N/V/D/C/Gas: no Abdominal pain: no Dumping syndrome: no    NUTRITION DIAGNOSIS  Overweight/obesity ([place]-3.3) related to past poor dietary habits and physical inactivity as evidenced by completed bariatric surgery and following dietary guidelines for continued weight loss and healthy nutrition status.  Track food and beverage intake (pen and paper, MyFitness Pal, Baritastic app, etc.) Make healthy food choices while monitoring portion sizes: use the detailed Myplate given to help create balanced meals inclusive of non starchy vegetables Consume 3 meals per day or try to eat every 3-5 hours Avoid concentrated sugars and fried foods Keep sugar & fat in the single digits per serving on food labels Practice CHEWING your food (aim for applesauce consistency) Practice not drinking 15 minutes before, during, and 30 minutes after each meal and snack Avoid all carbonated beverages (ex: soda, sparkling beverages)  Limit caffeinated  beverages (ex: coffee, tea, energy drinks) Avoid all sugar-sweetened beverages (ex: regular soda, sports drinks)  Avoid alcohol  Aim for 64-100 ounces of FLUID daily (with at least half of fluid intake being plain water)  Aim for at least 60-80 grams of PROTEIN daily Look for a liquid protein source that contains ?15 g protein and ?5 g carbohydrate (ex: shakes, drinks, shots) Make a list of non-food related activities Physical activity is an important part of a healthy lifestyle so keep it  moving! The goal is to reach 150 minutes of exercise per week, including cardiovascular and weight baring activity     NUTRITION INTERVENTION Nutrition counseling (C-1) and education (E-2) to facilitate bariatric surgery goals, including: Diet advancement to the next phase (phase 4) now including non-starchy vegetables The importance of consuming adequate calories as well as certain nutrients daily due to the body's need for essential vitamins, minerals, and fats The importance of daily physical activity and to reach a goal of at least 150 minutes of moderate to vigorous physical activity weekly (or as directed by their physician) due to benefits such as increased musculature and improved lab values The importance of intuitive eating specifically learning hunger-satiety cues and understanding the importance of learning a new body: The importance of mindful eating to avoid grazing behaviors   Purpose of hydration: Water makes up over 50% of your total body water, and is part of many organs throughout the body. Water is essential to transport digested nutrients, regulate body temperature, rid the body of waste products, and protects joints and the spinal cord. When not properly hydrated you will begin to experience headaches, cramps and dizziness. Further dehydration can result in rapid heart rate, shock, oliguria, and may cause seizures.  https://www.merckmanuals.com/home/hormonal-and-metabolic-disordehttps://www.usgs.gov/special-topic/water-science-school/science/water-you-water-and-human-body?qt-science_center_objects=0#qt-science_center_objectsrs/water-balance/about-body-water FriendLock.it InvestmentInstructor.com.cy FurEliminator.es https://www.health.BasicFM.no   Handouts Provided Include  Phase  4 Food Plan  Goals - increase water (add flavoring and herbal teas to enhance water intake) - continue taking milk of magnesia or Murelax - track water intake - eat every 3-5 hours - call doctor if still unable to have a bowel movement  Learning Style & Readiness for Change Teaching method utilized: Visual & Auditory  Demonstrated degree of understanding via: Teach Back  Readiness Level: preparation Barriers to learning/adherence to lifestyle change: busy work schedule (works nights, two jobs)  RD's Notes for Next Visit Assess adherence to pt chosen goals   MONITORING & EVALUATION Dietary intake, weekly physical activity, body weight.  Next Steps Patient is to follow-up in December for 6 month post-op follow-up.

## 2021-12-12 ENCOUNTER — Other Ambulatory Visit (HOSPITAL_COMMUNITY): Payer: Self-pay

## 2021-12-12 MED ORDER — LINZESS 145 MCG PO CAPS
145.0000 ug | ORAL_CAPSULE | Freq: Every day | ORAL | 0 refills | Status: DC
  Filled 2021-12-12: qty 30, 30d supply, fill #0

## 2022-01-13 ENCOUNTER — Other Ambulatory Visit (HOSPITAL_COMMUNITY): Payer: Self-pay

## 2022-01-14 ENCOUNTER — Other Ambulatory Visit (HOSPITAL_COMMUNITY): Payer: Self-pay

## 2022-01-14 MED ORDER — LINZESS 145 MCG PO CAPS
145.0000 ug | ORAL_CAPSULE | Freq: Every day | ORAL | 0 refills | Status: DC
  Filled 2022-01-14: qty 30, 30d supply, fill #0

## 2022-01-16 DIAGNOSIS — F3341 Major depressive disorder, recurrent, in partial remission: Secondary | ICD-10-CM | POA: Diagnosis not present

## 2022-01-16 DIAGNOSIS — E1165 Type 2 diabetes mellitus with hyperglycemia: Secondary | ICD-10-CM | POA: Diagnosis not present

## 2022-01-16 DIAGNOSIS — M25512 Pain in left shoulder: Secondary | ICD-10-CM | POA: Diagnosis not present

## 2022-01-16 DIAGNOSIS — Z903 Acquired absence of stomach [part of]: Secondary | ICD-10-CM | POA: Diagnosis not present

## 2022-01-17 ENCOUNTER — Other Ambulatory Visit (HOSPITAL_COMMUNITY): Payer: Self-pay

## 2022-01-17 MED ORDER — BUPROPION HCL ER (XL) 150 MG PO TB24
150.0000 mg | ORAL_TABLET | ORAL | 0 refills | Status: DC
  Filled 2022-01-17: qty 90, 90d supply, fill #0

## 2022-02-04 DIAGNOSIS — M25512 Pain in left shoulder: Secondary | ICD-10-CM | POA: Diagnosis not present

## 2022-02-18 ENCOUNTER — Other Ambulatory Visit (HOSPITAL_COMMUNITY): Payer: Self-pay

## 2022-02-18 DIAGNOSIS — F3341 Major depressive disorder, recurrent, in partial remission: Secondary | ICD-10-CM | POA: Diagnosis not present

## 2022-02-18 DIAGNOSIS — K5900 Constipation, unspecified: Secondary | ICD-10-CM | POA: Diagnosis not present

## 2022-02-18 DIAGNOSIS — Z9884 Bariatric surgery status: Secondary | ICD-10-CM | POA: Diagnosis not present

## 2022-02-18 MED ORDER — LINZESS 145 MCG PO CAPS
145.0000 ug | ORAL_CAPSULE | Freq: Every day | ORAL | 2 refills | Status: DC
  Filled 2022-02-18: qty 90, 90d supply, fill #0
  Filled 2022-05-26: qty 90, 90d supply, fill #1
  Filled 2022-08-05: qty 90, 90d supply, fill #2

## 2022-02-18 MED ORDER — BUPROPION HCL ER (XL) 150 MG PO TB24
150.0000 mg | ORAL_TABLET | Freq: Every morning | ORAL | 3 refills | Status: DC
  Filled 2022-02-18 – 2022-04-18 (×2): qty 90, 90d supply, fill #0
  Filled 2022-07-22: qty 90, 90d supply, fill #1
  Filled 2022-11-03: qty 90, 90d supply, fill #2
  Filled 2023-01-28: qty 90, 90d supply, fill #3

## 2022-02-19 ENCOUNTER — Other Ambulatory Visit (HOSPITAL_COMMUNITY): Payer: Self-pay

## 2022-02-20 ENCOUNTER — Other Ambulatory Visit: Payer: Self-pay | Admitting: Family Medicine

## 2022-02-20 DIAGNOSIS — Z9884 Bariatric surgery status: Secondary | ICD-10-CM | POA: Diagnosis not present

## 2022-02-20 DIAGNOSIS — Z1231 Encounter for screening mammogram for malignant neoplasm of breast: Secondary | ICD-10-CM

## 2022-02-20 DIAGNOSIS — K5900 Constipation, unspecified: Secondary | ICD-10-CM | POA: Diagnosis not present

## 2022-02-25 ENCOUNTER — Encounter: Payer: 59 | Attending: Surgery | Admitting: Dietician

## 2022-02-25 ENCOUNTER — Encounter: Payer: Self-pay | Admitting: Dietician

## 2022-02-25 VITALS — Ht 68.5 in | Wt 237.5 lb

## 2022-02-25 DIAGNOSIS — E669 Obesity, unspecified: Secondary | ICD-10-CM

## 2022-02-25 NOTE — Progress Notes (Signed)
Bariatric Nutrition Follow-Up Visit Medical Nutrition Therapy  Appt Start Time: 9:35  End Time: 10:04  Surgery date: 10/01/2021 Surgery type: sleeve  NUTRITION ASSESSMENT   Anthropometrics  Start weight at [facility]: 279 lbs (date: 03/27/2021)  Height: 68 in Weight today: 237.5 pounds BMI: 35.59 kg/m2     Clinical  Medical hx: GERD, depression, hyperlipidemia , HTN, pericarditis, CHF, prediabetes Medications: see list Labs: BUN/creatinine ratio 27, LDL 111, iron saturation 12, A1C 6.4, vitamin D 23.8 Notable signs/symptoms: constipation, arthritis pain Any previous deficiencies? No   Body Composition Scale 10/15/2021 11/26/2021 02/25/2022  Current Body Weight 285.1 273.8 237.5  Total Body Fat % 46.6 45.7 42.1  Visceral Fat 18 17 13   Fat-Free Mass % 53.3 54.2 57.8   Total Body Water % 41.1 41.6 43.4  Muscle-Mass lbs 34.3 34.2 33.9  BMI 42.9 41.1 35.6  Body Fat Displacement            Torso  lbs 82.6 77.7 62.0         Left Leg  lbs 16.5 15.5 12.4         Right Leg  lbs 16.5 15.5 12.4         Left Arm  lbs 8.2 7.7 6.2         Right Arm   lbs 8.2 7.7 6.2     Lifestyle & Dietary Hx  Pt states she is taking linzess, stating now she can go to the bathroom.  Pt states she would not have bowel movements at all before. Pt is doing great. Pt states she likes cabbage, broccoli and spinach, stating she rotates those vegetables.  Pt states she not big on vegetables. Pt states she add unflavored protein powder to her foods to get enough protein. Pt states she drinks on her water jug all day. Pt states even when she gets to her goal weight, she states she will keep going to the [employer] Pt states her goal is to be under 200 lbs, stating she would like to be at her goal by her birthday, but will keep working toward goal even if it is not before her birthday.  Estimated daily fluid intake: 64 oz Estimated daily protein intake: 60+ g Supplements: multi vitamin and calcium Current average weekly  physical activity: [employer] 3-4 times a week, treadmill, bicycle, weights, total of 1.5 hours or walking at work 1.5  24-Hr Dietary Recall (pt works nights, first meal is in the evening) First Meal: chicken, broccoli and cheese Snack: sugar-free pop cycle, sugar-free jello  Second Meal: Tuna, no bread Snack:   Third Meal: sausage, 2 eggs Snack:  Beverages: water  Post-Op Goals/ Signs/ Symptoms Using straws: no Drinking while eating: no Chewing/swallowing difficulties: no Changes in vision: no Changes to mood/headaches: no Hair loss/changes to skin/nails: no Difficulty focusing/concentrating: no Sweating: no Limb weakness: no Dizziness/lightheadedness: no Palpitations: no  Carbonated/caffeinated beverages: no N/V/D/C/Gas: no Abdominal pain: no Dumping syndrome: no    NUTRITION DIAGNOSIS  Overweight/obesity ([place]-3.3) related to past poor dietary habits and physical inactivity as evidenced by completed bariatric surgery and following dietary guidelines for continued weight loss and healthy nutrition status.   NUTRITION INTERVENTION Nutrition counseling (C-1) and education (E-2) to facilitate bariatric surgery goals, including: Diet advancement to the next phase (phase 5) now including starchy vegetables The importance of consuming adequate calories as well as certain nutrients daily due to the body's need for essential vitamins, minerals, and fats The importance of daily physical activity and to reach  a goal of at least 150 minutes of moderate to vigorous physical activity weekly (or as directed by their physician) due to benefits such as increased musculature and improved lab values The importance of intuitive eating specifically learning hunger-satiety cues and understanding the importance of learning a new body: The importance of mindful eating to avoid grazing behaviors   Handouts Provided Include  Phase 5 Food Plan  Goals - Continue: increase water (add flavoring and  herbal teas to enhance water intake) - track water intake - eat every 3-5 hours  Learning Style & Readiness for Change Teaching method utilized: Visual & Auditory  Demonstrated degree of understanding via: Teach Back  Readiness Level: action Barriers to learning/adherence to lifestyle change: nothing identified  RD's Notes for Next Visit Assess adherence to pt chosen goals   MONITORING & EVALUATION Dietary intake, weekly physical activity, body weight.  Next Steps Patient is to follow-up in March for 8 month post-op follow-up.

## 2022-04-18 ENCOUNTER — Ambulatory Visit: Payer: Self-pay

## 2022-04-18 ENCOUNTER — Other Ambulatory Visit (HOSPITAL_COMMUNITY): Payer: Self-pay

## 2022-04-25 ENCOUNTER — Other Ambulatory Visit (HOSPITAL_COMMUNITY): Payer: Self-pay

## 2022-05-02 ENCOUNTER — Ambulatory Visit
Admission: RE | Admit: 2022-05-02 | Discharge: 2022-05-02 | Disposition: A | Discharge status: Home / Self Care | Payer: Commercial Managed Care - PPO | Source: Ambulatory Visit | Attending: Family Medicine | Admitting: Family Medicine

## 2022-05-02 DIAGNOSIS — Z1231 Encounter for screening mammogram for malignant neoplasm of breast: Secondary | ICD-10-CM

## 2022-05-14 ENCOUNTER — Other Ambulatory Visit (HOSPITAL_COMMUNITY): Payer: Self-pay

## 2022-05-20 ENCOUNTER — Other Ambulatory Visit (HOSPITAL_COMMUNITY): Payer: Self-pay

## 2022-05-23 ENCOUNTER — Other Ambulatory Visit (HOSPITAL_COMMUNITY): Payer: Self-pay

## 2022-05-23 DIAGNOSIS — K219 Gastro-esophageal reflux disease without esophagitis: Secondary | ICD-10-CM | POA: Diagnosis not present

## 2022-05-23 DIAGNOSIS — Z9884 Bariatric surgery status: Secondary | ICD-10-CM | POA: Diagnosis not present

## 2022-05-23 DIAGNOSIS — E46 Unspecified protein-calorie malnutrition: Secondary | ICD-10-CM | POA: Diagnosis not present

## 2022-05-23 DIAGNOSIS — K5900 Constipation, unspecified: Secondary | ICD-10-CM | POA: Diagnosis not present

## 2022-05-23 MED ORDER — PANTOPRAZOLE SODIUM 40 MG PO TBEC
DELAYED_RELEASE_TABLET | ORAL | 11 refills | Status: DC
Start: 2022-05-23 — End: 2023-06-12
  Filled 2022-05-23: qty 30, 30d supply, fill #0
  Filled 2022-07-02: qty 30, 30d supply, fill #1
  Filled 2022-08-05: qty 30, 30d supply, fill #2
  Filled 2022-09-11: qty 30, 30d supply, fill #3
  Filled 2022-10-12: qty 30, 30d supply, fill #4
  Filled 2022-11-20: qty 30, 30d supply, fill #5
  Filled 2022-12-17: qty 30, 30d supply, fill #6
  Filled 2023-01-28: qty 30, 30d supply, fill #7
  Filled 2023-02-24: qty 30, 30d supply, fill #8
  Filled 2023-03-27 – 2023-05-15 (×2): qty 30, 30d supply, fill #9

## 2022-05-28 DIAGNOSIS — H9209 Otalgia, unspecified ear: Secondary | ICD-10-CM | POA: Diagnosis not present

## 2022-05-28 DIAGNOSIS — E785 Hyperlipidemia, unspecified: Secondary | ICD-10-CM | POA: Diagnosis not present

## 2022-06-03 ENCOUNTER — Ambulatory Visit: Payer: Self-pay | Admitting: Dietician

## 2022-06-09 ENCOUNTER — Telehealth: Payer: Self-pay | Admitting: Cardiology

## 2022-06-09 NOTE — Telephone Encounter (Signed)
Pt c/o of Chest Pain: STAT if CP now or developed within 24 hours  1. Are you having CP right now?  Yes   2. Are you experiencing any other symptoms (ex. SOB, nausea, vomiting, sweating)?  SOB  3. How long have you been experiencing CP?  CP has been at a 5-6 since 3/13  4. Is your CP continuous or coming and going?  Continuous   5. Have you taken Nitroglycerin?  No, she states she does not have any nitro  ?

## 2022-06-09 NOTE — Telephone Encounter (Signed)
Received call from patient c/o active chest pain.  Reports constant chest pain since Wednesday that has progressively gotten worse.   She states it is a steady 5-6/10.   The pain is constant on the right side, increased when she lays down.   Reports SOB, denies N/V/D.  Does report diarrhea but contributes this to her medication.  She does report starting colchicine (old rx) to take the "edge" off of the pain.    She reports this pain is similar to her previous pain when she had pericarditis.      Advised due to active symptoms and previous hx would recommend ER evaluation.  Patient verbalized understanding.

## 2022-06-10 ENCOUNTER — Encounter: Payer: Commercial Managed Care - PPO | Attending: Surgery | Admitting: Dietician

## 2022-06-10 ENCOUNTER — Encounter: Payer: Self-pay | Admitting: Dietician

## 2022-06-10 ENCOUNTER — Ambulatory Visit
Admission: RE | Admit: 2022-06-10 | Discharge: 2022-06-10 | Disposition: A | Discharge status: Home / Self Care | Payer: Commercial Managed Care - PPO | Source: Ambulatory Visit | Attending: Family Medicine | Admitting: Family Medicine

## 2022-06-10 ENCOUNTER — Other Ambulatory Visit: Payer: Self-pay | Admitting: Family Medicine

## 2022-06-10 VITALS — Ht 68.5 in | Wt 216.3 lb

## 2022-06-10 DIAGNOSIS — R079 Chest pain, unspecified: Secondary | ICD-10-CM

## 2022-06-10 DIAGNOSIS — Z713 Dietary counseling and surveillance: Secondary | ICD-10-CM | POA: Insufficient documentation

## 2022-06-10 DIAGNOSIS — R0602 Shortness of breath: Secondary | ICD-10-CM | POA: Diagnosis not present

## 2022-06-10 DIAGNOSIS — Z6832 Body mass index (BMI) 32.0-32.9, adult: Secondary | ICD-10-CM | POA: Diagnosis not present

## 2022-06-10 DIAGNOSIS — Z8679 Personal history of other diseases of the circulatory system: Secondary | ICD-10-CM | POA: Diagnosis not present

## 2022-06-10 DIAGNOSIS — E669 Obesity, unspecified: Secondary | ICD-10-CM

## 2022-06-10 LAB — LAB REPORT - SCANNED: EGFR: 86

## 2022-06-10 NOTE — Progress Notes (Signed)
Bariatric Nutrition Follow-Up Visit Medical Nutrition Therapy  Appt Start Time: 9:18  End Time: 9:56  Surgery date: 10/01/2021 Surgery type: sleeve  NUTRITION ASSESSMENT   Anthropometrics  Start weight at [facility]: 279 lbs (date: 03/27/2021)  Height: 68 in Weight today: 237.5 pounds BMI: 32.41 kg/m2     Clinical  Medical hx: GERD, depression, hyperlipidemia , HTN, pericarditis, CHF, prediabetes Medications: see list Labs: BUN/creatinine ratio 27, LDL 111, iron saturation 12, A1C 6.4, vitamin D 23.8 Notable signs/symptoms: constipation, arthritis pain Any previous deficiencies? No   Body Composition Scale 10/15/2021 11/26/2021 02/25/2022 06/10/2022  Current Body Weight 285.1 273.8 237.5 216.3  Total Body Fat % 46.6 45.7 42.1 39.6  Visceral Fat 18 17 13 11   Fat-Free Mass % 53.3 54.2 57.8 60.3   Total Body Water % 41.1 41.6 43.4 44.6  Muscle-Mass lbs 34.3 34.2 33.9 33.5  BMI 42.9 41.1 35.6 32.4  Body Fat Displacement             Torso  lbs 82.6 77.7 62.0 53.1         Left Leg  lbs 16.5 15.5 12.4 10.6         Right Leg  lbs 16.5 15.5 12.4 10.6         Left Arm  lbs 8.2 7.7 6.2 5.3         Right Arm   lbs 8.2 7.7 6.2 5.3     Lifestyle & Dietary Hx  Pt states her surgeon told her she could take pre-natal gummy multi vitamin. Pt states she is still taking linzess for constipation. Pt states she drinks water all the time, stating nothing but water. Pt states she has been trying different vegetables to get more of a variety. Pt states her goal is still to be under 200 lbs. Pt states she is using her air fryer a lot, stating she has an inside grill she uses as well. Pt is doing well.  Estimated daily fluid intake: 64 oz Estimated daily protein intake: 60+ g Supplements: pre-natal multi vitamin gummies and calcium Current average weekly physical activity: [employer] 3-4 times a week, treadmill, bicycle, weights, total of 1.5 hours or walking at work 1.5  24-Hr Dietary Recall (pt works  nights, first meal is in the evening) First Meal: up at 6pm: [id number] two slices of bacon and two eggs, potatoes and onions Snack: Second Meal: 11:30 pm or 1:30 am: tuna fish, protein smoothie (12 grams of protein) Snack:   Third Meal: 9:30 am [place] sausage, 2 eggs Snack:  Beverages: water, sometimes a small soda (and can't finish it)  Post-Op Goals/ Signs/ Symptoms Using straws: no Drinking while eating: no Chewing/swallowing difficulties: no Changes in vision: no Changes to mood/headaches: no Hair loss/changes to skin/nails: no Difficulty focusing/concentrating: no Sweating: no Limb weakness: no Dizziness/lightheadedness: no Palpitations: no  Carbonated/caffeinated beverages: no N/V/D/C/Gas: no Abdominal pain: no Dumping syndrome: no    NUTRITION DIAGNOSIS  Overweight/obesity ([place]-3.3) related to past poor dietary habits and physical inactivity as evidenced by completed bariatric surgery and following dietary guidelines for continued weight loss and healthy nutrition status.   NUTRITION INTERVENTION Nutrition counseling (C-1) and education (E-2) to facilitate bariatric surgery goals, including:  Encouraged patient to honor their body's internal hunger and fullness cues.  Throughout the day, check in mentally and rate hunger. Stop eating when satisfied not full regardless of how much food is left on the plate.  Get more if still hungry 20-30 minutes later.  The key  is to honor satisfaction so throughout the meal, rate fullness factor and stop when comfortably satisfied not physically full. The key is to honor hunger and fullness without any feelings of guilt or shame.  Pay attention to what the internal cues are, rather than any external factors. This will enhance the confidence you have in listening to your own body and following those internal cues enabling you to increase how often you eat when you are hungry not out of appetite and stop when you are satisfied not full.   Encouraged pt to continue to eat balanced meals inclusive of non starchy vegetables 2 times a day 7 days a week Encouraged pt to choose lean protein sources: limiting beef, pork, sausage, hotdogs, and lunch meat Encourage pt to choose healthy fats such as plant based limiting animal fats Encouraged pt to continue to drink a minium 64 fluid ounces with half being plain water to satisfy proper hydration     Handouts Provided Include  Meal Ideas Bariatric MyPlate Protein food list with value Multivitamin level requirements list (compare with pre-natal vitamin gummies)  Goals - Continue: increase water (add flavoring and herbal teas to enhance water intake) - Continue: track water intake - Continue: eat every 3-5 hours - New: track protein  Learning Style & Readiness for Change Teaching method utilized: Visual & Auditory  Demonstrated degree of understanding via: Teach Back  Readiness Level: action Barriers to learning/adherence to lifestyle change: nothing identified  RD's Notes for Next Visit Assess adherence to pt chosen goals  MONITORING & EVALUATION Dietary intake, weekly physical activity, body weight.  Next Steps Patient is to follow-up in July for 1 year post-op follow-up.

## 2022-06-10 NOTE — Telephone Encounter (Signed)
Alicia Wood called from [facility]ia [facility] office.  Per  Alicia Wood ,Alicia Wood is requesting an appointment with cardiology  today but no later than Thursday of this week.  Patient was seen by Alicia Wood today for chest pain.  She had labs drawn, and going to have chest X-ray done. The note and result will be sent to cardiology office.  Fax number given [phone].  Appointment made for 06/12/22 at 8:30 am.   Will contact patient with date and time.

## 2022-06-10 NOTE — Telephone Encounter (Signed)
RN contacted patient and gave the  date and time for appointment with Dr Percival Spanish   March 21,2024 at 8:30 am. Patient voiced understanding.

## 2022-06-11 NOTE — Progress Notes (Unsigned)
Cardiology Office Note   Date:  06/12/2022   ID:  Alicia Wood, DOB 27-Dec-1970, MRN [id number]  PCP:  Alicia Smoker, MD  Cardiologist:   Alicia Breeding, MD Referring:  Alicia Smoker, MD    Chief Complaint  Patient presents with   Chest Pain     History of Present Illness: Alicia Wood is a 52 y.o. female who was referred by Alicia Smoker, MD for evaluation of chest pain.   She called the other day for evaluation of chest pain..     She has had a history pericarditis in 2017.  She says this chest discomfort feels exactly the same.  She had some old colchicine that she has been taking since Friday and it helped a little bit.  She said it knocks down the discomfort and she is had a consistent 5-6 out of 10 in intensity discomfort.  Been sharp.  She feels a little bit better in a recliner.  She is having some mild shortness of breath.  She is feeling a couple of skipping heartbeats.  She has not had any fevers or chills.  Labs were done at her primary provider's office.  An EKG was done and I was able to see a photograph of this.  There is no ST elevation.  Labs are pending including inflammatory markers.  She is otherwise been going to the gym and doing really well.  She is lost 85 pounds after weight loss surgery.  She has been biking and doing some weights and not having any chest discomfort with this.  She has not been having any PND or orthopnea.   Past Medical History:  Diagnosis Date   Back pain    Chest pain    a. Initially in setting of pericarditis in 11/2015; b. Ongoing cp despite prednisone, colchicine, ibuprofen,and narcotic therapy;  c. 12/2015 CTA Chest: No PE, stable small-mod pericardial effusion;  d. 12/2015 Cath: nl cors.   Chest pain    Chronic diastolic CHF (congestive heart failure) ([place])    a. 11/2015 Echo: EF 0000000, grade 2 diastolic dysfxn.   Depression    GERD (gastroesophageal reflux disease)    Headache     Hyperlipidemia    Hypertension    Patient states she does not have hypertension.    Osteoarthritis    Pericardial effusion    a. 11/23/2015 Echo: mod pericardial effusion, no tamponade; b. 12/05/2015 Echo: small circumferential pericardial effusion; c. 12/06/2015 Cardiac MRI: pericardial thickening w/ circumferential late gad enhancement of pericardium and epicardium-->suspicious for subacute effusive constrictive pericarditis;  d. 12/19/2015 Echo: triv pericardial effusion;  e. 01/06/2016 CT Chest: small to mod pericard eff.   Pericarditis    a. 11/2015 Cardiac MRI; EF 57%, mild MR, triv TR, mild pericardial effusion, thickening of pericardium focally up to 2mm w/ significant circumferential late gad enhancement of pericardium and epicardium sugg of subacute effusive constrictive pericarditis;  b. 12/2015 R Heart Cath: nl right heart pressures w/o evidence of constrictive physiology.   Torn meniscus     Past Surgical History:  Procedure Laterality Date   ABDOMINAL HYSTERECTOMY     age 17   APPENDECTOMY     as a teen   CARDIAC CATHETERIZATION N/A 01/21/2016   Procedure: Right/Left Heart Cath and Coronary Angiography;  Surgeon: Burnell Blanks, MD;  Location: [facility];  Service: Cardiovascular;  Laterality: N/A;   ESOPHAGOGASTRODUODENOSCOPY (EGD) WITH PROPOFOL N/A 01/30/2016   Procedure: ESOPHAGOGASTRODUODENOSCOPY (EGD) WITH PROPOFOL;  Surgeon: Alicia Corner, MD;  Location: [facility];  Service: Endoscopy;  Laterality: N/A;   LAPAROSCOPIC GASTRIC SLEEVE RESECTION N/A 10/01/2021   Procedure: LAPAROSCOPIC SLEEVE GASTRECTOMY;  Surgeon: Alicia Riley, MD;  Location: [facility];  Service: General;  Laterality: N/A;   TUBAL LIGATION  1992   UPPER GI ENDOSCOPY N/A 10/01/2021   Procedure: UPPER GI ENDOSCOPY;  Surgeon: Alicia Riley, MD;  Location: [facility];  Service: General;  Laterality: N/A;     Current Outpatient Medications  Medication Sig Dispense Refill   bisacodyl  (DULCOLAX) 10 MG suppository Place 1 suppository (10 mg total) rectally once daily as needed for Constipation for up to 10 days 10 suppository 0   buPROPion (WELLBUTRIN XL) 150 MG 24 hr tablet Take 1 tablet (150 mg total) by mouth in the morning. 90 tablet 3   colchicine 0.6 MG tablet Take 1 tablet (0.6 mg total) by mouth 2 (two) times daily. 180 tablet 0   linaclotide (LINZESS) 145 MCG CAPS capsule Take 1 capsule (145 mcg total) by mouth daily 30 minutes before the first meal of the day on an empty stomach 90 capsule 2   pantoprazole (PROTONIX) 40 MG tablet Take 1 tablet (40 mg total) by mouth once daily 30 tablet 11   FLUoxetine (PROZAC) 20 MG capsule Take 1 capsule by mouth once daily (Patient not taking: Reported on 06/12/2022) 90 capsule 3   gabapentin (NEURONTIN) 100 MG capsule Take 2 capsules (200 mg total) by mouth every 12 (twelve) hours. (Patient not taking: Reported on 06/12/2022) 20 capsule 0   LACTOBACILLUS PO Take by mouth.     Nutritional Supplements (VITAMIN D PLUS COFACTORS) TABS as directed Orally     ondansetron (ZOFRAN-ODT) 4 MG disintegrating tablet Dissolve 1 tablet (4 mg total) by mouth every 6 (six) hours as needed for nausea or vomiting. (Patient not taking: Reported on 06/12/2022) 20 tablet 0   pantoprazole (PROTONIX) 40 MG tablet Take 1 tablet (40 mg total) by mouth daily. Take this medication daily, regardless of reflux symptoms (Patient not taking: Reported on 06/12/2022) 90 tablet 0   rosuvastatin (CRESTOR) 20 MG tablet Take 1 tablet (20 mg total) by mouth daily. 90 tablet 3   Sodium Phosphates (ENEMA) 7-19 GM/118ML ENEM Place 1 enema rectally once for 1 dose (Patient not taking: Reported on 06/12/2022) 118 mL 0   traMADol (ULTRAM) 50 MG tablet Take 1 tablet (50 mg total) by mouth every 6 (six) hours as needed (pain). (Patient not taking: Reported on 06/12/2022) 10 tablet 0   No current facility-administered medications for this visit.    Allergies:   Latex    ROS:   Please see the history of present illness.   Otherwise, review of systems are positive for none.   All other systems are reviewed and negative.    PHYSICAL EXAM: VS:  BP 108/76   Pulse 62   Ht 5' 8.75" (1.746 m)   Wt 216 lb (98 kg)   SpO2 99%   BMI 32.13 kg/m  , BMI Body mass index is 32.13 kg/m. GENERAL:  Well appearing NECK:  No jugular venous distention, waveform within normal limits, carotid upstroke brisk and symmetric, no bruits, no thyromegaly LUNGS:  Clear to auscultation bilaterally CHEST:  Unremarkable HEART:  PMI not displaced or sustained,S1 and S2 within normal limits, no S3, no S4, no clicks, no rubs, no murmurs ABD:  Flat, positive bowel sounds normal in frequency in pitch, no bruits, no rebound, no guarding, no  midline pulsatile mass, no hepatomegaly, no splenomegaly EXT:  2 plus pulses throughout, no edema, no cyanosis no clubbing   EKG:  EKG is  ordered today. The ekg ordered demonstrates normal sinus rhythm, rate 62 axis within normal limits, intervals within normal limits, no acute ST-T wave changes.   Recent Labs: 10/02/2021: ALT 17; BUN 10; Creatinine, Ser 0.74; Hemoglobin 11.6; Magnesium 2.1; Platelets 299; Potassium 4.1; Sodium 141    Lipid Panel No results found for: "CHOL", "TRIG", "HDL", "CHOLHDL", "VLDL", "LDLCALC", "LDLDIRECT"    Wt Readings from Last 3 Encounters:  06/12/22 216 lb (98 kg)  06/10/22 216 lb 4.8 oz (98.1 kg)  02/25/22 237 lb 8 oz (107.7 kg)      Other studies Reviewed: Additional studies/ records that were reviewed today include: Labs Review of the above records demonstrates:  Please see elsewhere in the note.     ASSESSMENT AND PLAN:  CHEST PAIN:   Given the patient's chest pain is consistent with pericarditis.  I am going to check an echocardiogram.  I will get the results of her inflammatory markers.  I will rewrite a new prescription for the colchicine and will do this for at least 3 months.  There is a caution of using  nonsteroidals on her given her gastric surgery but I think we have to use this at least for a small time and I will do a slower dose at 600 mg twice daily.  She will call me and let me know how this is going.  I like to do this probably for only 10 days if I can.   HTN: Her blood pressure is well-controlled.  No change in therapy.  CHRONIC DIASTOLIC DYSFUNCTION:   She has had no new shortness of breath.  She seems to be euvolemic.  No change in therapy.   Current medicines are reviewed at length with the patient today.  The patient does not have concerns regarding medicines.  The following changes have been made: None   Labs/ tests ordered today include:  None   Orders Placed This Encounter  Procedures   EKG 12-Lead   ECHOCARDIOGRAM COMPLETE      Disposition:   FU with me or APP  in 2 weeks.    Signed, Alicia Breeding, MD  06/12/2022 9:36 AM    [facility]

## 2022-06-12 ENCOUNTER — Encounter: Payer: Self-pay | Admitting: Cardiology

## 2022-06-12 ENCOUNTER — Other Ambulatory Visit (HOSPITAL_COMMUNITY): Payer: Self-pay

## 2022-06-12 ENCOUNTER — Ambulatory Visit: Payer: Commercial Managed Care - PPO | Attending: Cardiology | Admitting: Cardiology

## 2022-06-12 VITALS — BP 108/76 | HR 62 | Ht 68.75 in | Wt 216.0 lb

## 2022-06-12 DIAGNOSIS — I5032 Chronic diastolic (congestive) heart failure: Secondary | ICD-10-CM

## 2022-06-12 DIAGNOSIS — R072 Precordial pain: Secondary | ICD-10-CM | POA: Diagnosis not present

## 2022-06-12 DIAGNOSIS — I10 Essential (primary) hypertension: Secondary | ICD-10-CM

## 2022-06-12 MED ORDER — COLCHICINE 0.6 MG PO TABS
0.6000 mg | ORAL_TABLET | Freq: Two times a day (BID) | ORAL | 0 refills | Status: DC
Start: 2022-06-12 — End: 2022-09-11
  Filled 2022-06-12: qty 180, 90d supply, fill #0

## 2022-06-12 NOTE — Patient Instructions (Signed)
Medication Instructions:  Take Colchicine 0.6 mg twice a day for 3 months  Take Motrin 600 mg twice a day for 10 days   (Take with food)  Continue all other medications    Lab Work: None ordered   Testing/Procedures: Schedule Echo  soon   Follow-Up: At [facility], you and your health needs are our priority.  As part of our continuing mission to provide you with exceptional heart care, we have created designated Provider Care Teams.  These Care Teams include your primary Cardiologist (physician) and Advanced Practice Providers (APPs -  Physician Assistants and Nurse Practitioners) who all work together to provide you with the care you need, when you need it.  We recommend signing up for the patient portal called "MyChart".  Sign up information is provided on this After Visit Summary.  MyChart is used to connect with patients for Virtual Visits (Telemedicine).  Patients are able to view lab/test results, encounter notes, upcoming appointments, etc.  Non-urgent messages can be sent to your provider as well.   To learn more about what you can do with MyChart, go to [email or web address].    Your next appointment:  2 weeks     ( If you are better you can cancel appointment )    Provider:  Dr.Hochrein's PA

## 2022-06-13 ENCOUNTER — Ambulatory Visit (HOSPITAL_COMMUNITY): Payer: Commercial Managed Care - PPO | Attending: Cardiology

## 2022-06-13 DIAGNOSIS — R072 Precordial pain: Secondary | ICD-10-CM

## 2022-06-13 DIAGNOSIS — I10 Essential (primary) hypertension: Secondary | ICD-10-CM

## 2022-06-13 DIAGNOSIS — I5032 Chronic diastolic (congestive) heart failure: Secondary | ICD-10-CM | POA: Diagnosis not present

## 2022-06-13 DIAGNOSIS — I517 Cardiomegaly: Secondary | ICD-10-CM | POA: Diagnosis not present

## 2022-06-13 DIAGNOSIS — I77810 Thoracic aortic ectasia: Secondary | ICD-10-CM | POA: Diagnosis not present

## 2022-06-13 LAB — ECHOCARDIOGRAM COMPLETE
Area-P 1/2: 3.48 cm2
S' Lateral: 3.4 cm

## 2022-06-24 NOTE — Progress Notes (Deleted)
Cardiology Office Note:   Date:  06/24/2022  ID:  Delton Prairie, DOB [birth year], MRN [id number]  History of Present Illness:   Alicia Wood is a 52 y.o. female who presented the other day for evaluation of chest pain.  She is being treated for possible pericarditis.  ***   *** was referred by Glenis Smoker, MD for evaluation of chest pain.   She called the other day for evaluation of chest pain..      She has had a history pericarditis in 2017.  She says this chest discomfort feels exactly the same.  She had some old colchicine that she has been taking since Friday and it helped a little bit.  She said it knocks down the discomfort and she is had a consistent 5-6 out of 10 in intensity discomfort.  Been sharp.  She feels a little bit better in a recliner.  She is having some mild shortness of breath.  She is feeling a couple of skipping heartbeats.  She has not had any fevers or chills.  Labs were done at her primary provider's office.  An EKG was done and I was able to see a photograph of this.  There is no ST elevation.  Labs are pending including inflammatory markers.  She is otherwise been going to the gym and doing really well.  She is lost 85 pounds after weight loss surgery.  She has been biking and doing some weights and not having any chest discomfort with this.  She has not been having any PND or orthopnea.  ROS: ***  Studies Reviewed:    EKG:  ***  ***  Risk Assessment/Calculations:   {Does this patient have ATRIAL FIBRILLATION?[phone]} No BP recorded.  {Refresh Note OR Click here to enter BP  :1}***        Physical Exam:   VS:  There were no vitals taken for this visit.   Wt Readings from Last 3 Encounters:  06/12/22 216 lb (98 kg)  06/10/22 216 lb 4.8 oz (98.1 kg)  02/25/22 237 lb 8 oz (107.7 kg)     GEN: Well nourished, well developed in no acute distress NECK: No JVD; No carotid bruits CARDIAC: ***RRR, no murmurs, rubs,  gallops RESPIRATORY:  Clear to auscultation without rales, wheezing or rhonchi  ABDOMEN: Soft, non-tender, non-distended EXTREMITIES:  No edema; No deformity   ASSESSMENT AND PLAN:   CHEST PAIN:   ***   Given the patient's chest pain is consistent with pericarditis.  I am going to check an echocardiogram.  I will get the results of her inflammatory markers.  I will rewrite a new prescription for the colchicine and will do this for at least 3 months.  There is a caution of using nonsteroidals on her given her gastric surgery but I think we have to use this at least for a small time and I will do a slower dose at 600 mg twice daily.  She will call me and let me know how this is going.  I like to do this probably for only 10 days if I can.    HTN: Her blood pressure is *** well-controlled.  No change in therapy.   CHRONIC DIASTOLIC DYSFUNCTION:   ***   She has had no new shortness of breath.  She seems to be euvolemic.  No change in therapy.    {Are you ordering a CV Procedure (e.g. stress test, cath, DCCV, TEE, etc)?   Press F2        :  [id number]   Signed, Minus Breeding, MD

## 2022-06-26 ENCOUNTER — Ambulatory Visit: Payer: Commercial Managed Care - PPO | Admitting: Cardiology

## 2022-06-26 DIAGNOSIS — R072 Precordial pain: Secondary | ICD-10-CM

## 2022-06-26 DIAGNOSIS — I5032 Chronic diastolic (congestive) heart failure: Secondary | ICD-10-CM

## 2022-06-26 DIAGNOSIS — I10 Essential (primary) hypertension: Secondary | ICD-10-CM

## 2022-06-28 ENCOUNTER — Other Ambulatory Visit (HOSPITAL_COMMUNITY): Payer: Self-pay

## 2022-07-02 ENCOUNTER — Other Ambulatory Visit (HOSPITAL_COMMUNITY): Payer: Self-pay

## 2022-07-05 ENCOUNTER — Other Ambulatory Visit (HOSPITAL_COMMUNITY): Payer: Self-pay

## 2022-07-22 ENCOUNTER — Other Ambulatory Visit: Payer: Self-pay

## 2022-07-22 ENCOUNTER — Other Ambulatory Visit (HOSPITAL_COMMUNITY): Payer: Self-pay

## 2022-07-22 ENCOUNTER — Other Ambulatory Visit: Payer: Self-pay | Admitting: Cardiology

## 2022-07-22 DIAGNOSIS — I10 Essential (primary) hypertension: Secondary | ICD-10-CM

## 2022-07-22 DIAGNOSIS — I300 Acute nonspecific idiopathic pericarditis: Secondary | ICD-10-CM

## 2022-07-22 DIAGNOSIS — I5032 Chronic diastolic (congestive) heart failure: Secondary | ICD-10-CM

## 2022-07-22 MED ORDER — ROSUVASTATIN CALCIUM 20 MG PO TABS
20.0000 mg | ORAL_TABLET | Freq: Every day | ORAL | 3 refills | Status: DC
  Filled 2022-07-22: qty 90, 90d supply, fill #0
  Filled 2022-10-12: qty 90, 90d supply, fill #1
  Filled 2023-01-28: qty 90, 90d supply, fill #2
  Filled 2023-05-28: qty 90, 90d supply, fill #3

## 2022-07-24 ENCOUNTER — Other Ambulatory Visit (HOSPITAL_COMMUNITY): Payer: Self-pay

## 2022-08-06 ENCOUNTER — Other Ambulatory Visit (HOSPITAL_COMMUNITY): Payer: Self-pay

## 2022-08-21 DIAGNOSIS — F3341 Major depressive disorder, recurrent, in partial remission: Secondary | ICD-10-CM | POA: Diagnosis not present

## 2022-08-21 DIAGNOSIS — Z0000 Encounter for general adult medical examination without abnormal findings: Secondary | ICD-10-CM | POA: Diagnosis not present

## 2022-08-21 DIAGNOSIS — F520 Hypoactive sexual desire disorder: Secondary | ICD-10-CM | POA: Diagnosis not present

## 2022-08-21 DIAGNOSIS — E1165 Type 2 diabetes mellitus with hyperglycemia: Secondary | ICD-10-CM | POA: Diagnosis not present

## 2022-08-21 DIAGNOSIS — E559 Vitamin D deficiency, unspecified: Secondary | ICD-10-CM | POA: Diagnosis not present

## 2022-08-21 DIAGNOSIS — D649 Anemia, unspecified: Secondary | ICD-10-CM | POA: Diagnosis not present

## 2022-08-21 DIAGNOSIS — E785 Hyperlipidemia, unspecified: Secondary | ICD-10-CM | POA: Diagnosis not present

## 2022-08-22 ENCOUNTER — Other Ambulatory Visit (HOSPITAL_COMMUNITY): Payer: Self-pay

## 2022-08-22 MED ORDER — ROSUVASTATIN CALCIUM 10 MG PO TABS
10.0000 mg | ORAL_TABLET | Freq: Every day | ORAL | 3 refills | Status: DC
  Filled 2022-08-22 – 2022-09-11 (×2): qty 90, 90d supply, fill #0
  Filled 2022-12-17: qty 90, 90d supply, fill #1
  Filled 2023-05-15: qty 90, 90d supply, fill #2

## 2022-09-03 ENCOUNTER — Other Ambulatory Visit (HOSPITAL_COMMUNITY): Payer: Self-pay

## 2022-09-11 ENCOUNTER — Other Ambulatory Visit: Payer: Self-pay

## 2022-09-11 ENCOUNTER — Other Ambulatory Visit: Payer: Self-pay | Admitting: Cardiology

## 2022-09-11 ENCOUNTER — Other Ambulatory Visit (HOSPITAL_COMMUNITY): Payer: Self-pay

## 2022-09-11 MED ORDER — COLCHICINE 0.6 MG PO TABS
0.6000 mg | ORAL_TABLET | Freq: Two times a day (BID) | ORAL | 0 refills | Status: DC
  Filled 2022-09-11 – 2022-10-12 (×2): qty 180, 90d supply, fill #0

## 2022-09-12 ENCOUNTER — Other Ambulatory Visit (HOSPITAL_COMMUNITY): Payer: Self-pay

## 2022-09-18 ENCOUNTER — Other Ambulatory Visit: Payer: Self-pay

## 2022-09-19 ENCOUNTER — Other Ambulatory Visit (HOSPITAL_COMMUNITY): Payer: Self-pay

## 2022-09-30 ENCOUNTER — Ambulatory Visit: Payer: Commercial Managed Care - PPO | Admitting: Dietician

## 2022-10-13 ENCOUNTER — Other Ambulatory Visit: Payer: Self-pay

## 2022-10-13 ENCOUNTER — Other Ambulatory Visit (HOSPITAL_COMMUNITY): Payer: Self-pay

## 2022-10-14 ENCOUNTER — Ambulatory Visit: Payer: Commercial Managed Care - PPO | Admitting: Dietician

## 2022-10-17 ENCOUNTER — Encounter: Payer: Self-pay | Admitting: Dietician

## 2022-10-17 ENCOUNTER — Encounter: Payer: Commercial Managed Care - PPO | Attending: Surgery | Admitting: Dietician

## 2022-10-17 VITALS — Ht 68.0 in | Wt 209.6 lb

## 2022-10-17 DIAGNOSIS — E669 Obesity, unspecified: Secondary | ICD-10-CM

## 2022-10-17 NOTE — Progress Notes (Signed)
Bariatric Nutrition Follow-Up Visit Medical Nutrition Therapy  Appt Start Time: 9:47  End Time: 10:19  Surgery date: 10/01/2021 Surgery type: sleeve  NUTRITION ASSESSMENT   Anthropometrics  Start weight at [facility]: 279 lbs (date: 03/27/2021)  Height: 68 in Weight today: 209.6 pounds   Clinical  Medical hx: GERD, depression, hyperlipidemia , HTN, pericarditis, CHF, prediabetes Medications: see list Labs: chloride 108, vitamin D 24.1; Vitamin B12 1,902; Vitamin B1 63.1 Notable signs/symptoms: constipation, arthritis pain Any previous deficiencies? No   Body Composition Scale 10/15/2021 11/26/2021 02/25/2022 06/10/2022 10/17/2022  Current Body Weight 285.1 273.8 237.5 216.3 209.6  Total Body Fat % 46.6 45.7 42.1 39.6 38.9  Visceral Fat 18 17 13 11 11   Fat-Free Mass % 53.3 54.2 57.8 60.3 61.0   Total Body Water % 41.1 41.6 43.4 44.6 45.0  Muscle-Mass lbs 34.3 34.2 33.9 33.5 33.2  BMI 42.9 41.1 35.6 32.4 31.8  Body Fat Displacement              Torso  lbs 82.6 77.7 62.0 53.1 50.5         Left Leg  lbs 16.5 15.5 12.4 10.6 10.1         Right Leg  lbs 16.5 15.5 12.4 10.6 10.1         Left Arm  lbs 8.2 7.7 6.2 5.3 5.0         Right Arm   lbs 8.2 7.7 6.2 5.3 5.0     Lifestyle & Dietary Hx  Pt states she is not taking a multivitamin. Pt states her surgeon told her she could take a prenatal gummy vitamin, stating that she never took it. Pt states she is getting close to 60 grams a day, stating she uses unflavored protein powder to help supplement. Pt states she likes the frozen meals that were an option in the pre-op class.  Estimated daily fluid intake: 64 oz Estimated daily protein intake: 60 g Supplements: pre-natal multi vitamin gummies and calcium Current average weekly physical activity: [employer] 2 times a week, treadmill, bicycle, weights, total of 1.5 hours or walking at work 1.5 or gym at work about 2 times a week.  24-Hr Dietary Recall (pt works nights, first meal is in the  evening) First Meal: up at 6pm: 6:15 pm sausage and two eggs Snack: Second Meal: 11:30 pm or 1:30 am: tuna fish, protein smoothie (12 grams of protein); broccoli or sandwich Snack:   Third Meal: 9:30 am [place] sausage, 2 eggs or high protein low carb meals Snack:  Beverages: water, sometimes a small soda (and can't finish it)  Post-Op Goals/ Signs/ Symptoms Using straws: no Drinking while eating: no Chewing/swallowing difficulties: no Changes in vision: no Changes to mood/headaches: no Hair loss/changes to skin/nails: no Difficulty focusing/concentrating: no Sweating: no Limb weakness: no Dizziness/lightheadedness: no Palpitations: no  Carbonated/caffeinated beverages: no N/V/D/C/Gas: no Abdominal pain: no Dumping syndrome: no    NUTRITION DIAGNOSIS  Overweight/obesity ([place]-3.3) related to past poor dietary habits and physical inactivity as evidenced by completed bariatric surgery and following dietary guidelines for continued weight loss and healthy nutrition status.   NUTRITION INTERVENTION Nutrition counseling (C-1) and education (E-2) to facilitate bariatric surgery goals, including:  Encouraged patient to honor their body's internal hunger and fullness cues.  Throughout the day, check in mentally and rate hunger. Stop eating when satisfied not full regardless of how much food is left on the plate.  Get more if still hungry 20-30 minutes later.  The key is to  honor satisfaction so throughout the meal, rate fullness factor and stop when comfortably satisfied not physically full. The key is to honor hunger and fullness without any feelings of guilt or shame.  Pay attention to what the internal cues are, rather than any external factors. This will enhance the confidence you have in listening to your own body and following those internal cues enabling you to increase how often you eat when you are hungry not out of appetite and stop when you are satisfied not full.  Encouraged pt to  continue to eat balanced meals inclusive of non starchy vegetables 2 times a day 7 days a week Encouraged pt to choose lean protein sources: limiting beef, pork, sausage, hotdogs, and lunch meat Encourage pt to choose healthy fats such as plant based limiting animal fats Encouraged pt to continue to drink a minium 64 fluid ounces with half being plain water to satisfy proper hydration   Why you need complex carbohydrates: Whole grains and other complex carbohydrates are required to have a healthy diet. Whole grains provide fiber which can help with blood glucose levels and help keep you satiated. Fruits and starchy vegetables provide essential vitamins and minerals required for immune function, eyesight support, brain support, bone density, wound healing and many other functions within the body. According to the current evidenced based 2020-2025 Dietary Guidelines for Americans, complex carbohydrates are part of a healthy eating pattern which is associated with a decreased risk for type 2 diabetes, cancers, and cardiovascular disease.   Handouts Provided Include  Maintenance Plan (1 Year) Protein Foods List with value  Goals - Continue: increase water (add flavoring and herbal teas to enhance water intake) - Continue: track water intake - Continue: eat every 3-5 hours - New: include protein with every meal or snack; track your protein - New: aim for 2 or more servings of non-starchy vegetables - Re-engage: take a multivitamin  Learning Style & Readiness for Change Teaching method utilized: Visual & Auditory  Demonstrated degree of understanding via: Teach Back  Readiness Level: action Barriers to learning/adherence to lifestyle change: nothing identified  RD's Notes for Next Visit Assess adherence to pt chosen goals  MONITORING & EVALUATION Dietary intake, weekly physical activity, body weight.  Next Steps Patient is to return to [facility] in 3-4 months for follow-up.

## 2022-11-20 ENCOUNTER — Other Ambulatory Visit (HOSPITAL_COMMUNITY): Payer: Self-pay

## 2022-11-21 ENCOUNTER — Other Ambulatory Visit (HOSPITAL_COMMUNITY): Payer: Self-pay

## 2022-11-25 ENCOUNTER — Other Ambulatory Visit (HOSPITAL_COMMUNITY): Payer: Self-pay

## 2022-11-25 MED ORDER — LINZESS 145 MCG PO CAPS
145.0000 ug | ORAL_CAPSULE | Freq: Every day | ORAL | 2 refills | Status: DC
  Filled 2022-11-25: qty 90, 90d supply, fill #0
  Filled 2023-02-24: qty 90, 90d supply, fill #1
  Filled 2023-05-28: qty 90, 90d supply, fill #2

## 2022-12-17 ENCOUNTER — Other Ambulatory Visit (HOSPITAL_COMMUNITY): Payer: Self-pay

## 2022-12-17 ENCOUNTER — Other Ambulatory Visit: Payer: Self-pay

## 2022-12-17 DIAGNOSIS — J019 Acute sinusitis, unspecified: Secondary | ICD-10-CM | POA: Diagnosis not present

## 2022-12-17 DIAGNOSIS — Z03818 Encounter for observation for suspected exposure to other biological agents ruled out: Secondary | ICD-10-CM | POA: Diagnosis not present

## 2022-12-17 DIAGNOSIS — R059 Cough, unspecified: Secondary | ICD-10-CM | POA: Diagnosis not present

## 2022-12-17 MED ORDER — BENZONATATE 100 MG PO CAPS
100.0000 mg | ORAL_CAPSULE | Freq: Three times a day (TID) | ORAL | 0 refills | Status: DC
Start: 2022-12-17 — End: 2023-09-15
  Filled 2022-12-17: qty 30, 10d supply, fill #0

## 2022-12-17 MED ORDER — AMOXICILLIN 875 MG PO TABS
875.0000 mg | ORAL_TABLET | Freq: Two times a day (BID) | ORAL | 0 refills | Status: DC
  Filled 2022-12-17: qty 10, 5d supply, fill #0

## 2022-12-24 ENCOUNTER — Other Ambulatory Visit: Payer: Self-pay

## 2022-12-24 ENCOUNTER — Encounter (HOSPITAL_COMMUNITY): Payer: Self-pay | Admitting: Emergency Medicine

## 2022-12-24 ENCOUNTER — Emergency Department (HOSPITAL_COMMUNITY): Payer: Commercial Managed Care - PPO

## 2022-12-24 ENCOUNTER — Emergency Department (HOSPITAL_COMMUNITY)
Admission: EM | Admit: 2022-12-24 | Discharge: 2022-12-25 | Disposition: A | Discharge status: Home / Self Care | Payer: Commercial Managed Care - PPO | Attending: Emergency Medicine | Admitting: Emergency Medicine

## 2022-12-24 DIAGNOSIS — I509 Heart failure, unspecified: Secondary | ICD-10-CM | POA: Insufficient documentation

## 2022-12-24 DIAGNOSIS — Z91040 Latex allergy status: Secondary | ICD-10-CM | POA: Insufficient documentation

## 2022-12-24 DIAGNOSIS — R0789 Other chest pain: Secondary | ICD-10-CM | POA: Diagnosis not present

## 2022-12-24 DIAGNOSIS — I700 Atherosclerosis of aorta: Secondary | ICD-10-CM | POA: Diagnosis not present

## 2022-12-24 DIAGNOSIS — R079 Chest pain, unspecified: Secondary | ICD-10-CM | POA: Diagnosis present

## 2022-12-24 DIAGNOSIS — R091 Pleurisy: Secondary | ICD-10-CM | POA: Insufficient documentation

## 2022-12-24 DIAGNOSIS — R0781 Pleurodynia: Secondary | ICD-10-CM | POA: Diagnosis not present

## 2022-12-24 LAB — BASIC METABOLIC PANEL
Anion gap: 6 (ref 5–15)
BUN: 17 mg/dL (ref 6–20)
CO2: 27 mmol/L (ref 22–32)
Calcium: 9 mg/dL (ref 8.9–10.3)
Chloride: 107 mmol/L (ref 98–111)
Creatinine, Ser: 0.75 mg/dL (ref 0.44–1.00)
GFR, Estimated: >60 mL/min (ref >60)
Glucose, Bld: 97 mg/dL (ref 70–99)
Potassium: 3.9 mmol/L (ref 3.5–5.1)
Sodium: 140 mmol/L (ref 135–145)

## 2022-12-24 LAB — CBC
HCT: 36.9 % (ref 36.0–46.0)
Hemoglobin: 11.7 g/dL — ABNORMAL LOW (ref 12.0–15.0)
MCH: 28.8 pg (ref 26.0–34.0)
MCHC: 31.7 g/dL (ref 30.0–36.0)
MCV: 90.9 fL (ref 80.0–100.0)
Platelets: 276 10*3/uL (ref 150–400)
RBC: 4.06 MIL/uL (ref 3.87–5.11)
RDW: 14.4 % (ref 11.5–15.5)
WBC: 5 10*3/uL (ref 4.0–10.5)
nRBC: 0 % (ref 0.0–0.2)

## 2022-12-24 LAB — TROPONIN I (HIGH SENSITIVITY): Troponin I (High Sensitivity): 3 ng/L (ref <18)

## 2022-12-24 MED ORDER — KETOROLAC TROMETHAMINE 15 MG/ML IJ SOLN: 15.0000 mg | Freq: Once | INTRAMUSCULAR | Status: DC

## 2022-12-24 MED ORDER — KETOROLAC TROMETHAMINE 15 MG/ML IJ SOLN
15.0000 mg | Freq: Once | INTRAMUSCULAR | Status: AC
  Administered 2022-12-24: 15 mg via INTRAVENOUS
  Filled 2022-12-24: qty 1

## 2022-12-24 MED ORDER — OXYCODONE-ACETAMINOPHEN 5-325 MG PO TABS
1.0000 | ORAL_TABLET | Freq: Once | ORAL | Status: DC
  Filled 2022-12-24: qty 1

## 2022-12-24 NOTE — ED Triage Notes (Signed)
Pt presents via POV with complaints of generalized CP that started several days ago. Pt has a hx of pericarditis and the pain she is having feels similar. Rates her pain 9/10 unrelieved with OTC pain meds. A&Ox4 at this time. Denies fevers, chills, N/V/D, or SOB.

## 2022-12-24 NOTE — ED Provider Notes (Signed)
[place] EMERGENCY DEPARTMENT AT [facility] Provider Note   CSN: [id number] Arrival date & time: 12/24/22  2156     History Chief Complaint  Patient presents with  . Chest Pain    Alicia Wood is a 52 y.o. female.   Chest Pain      Home Medications Prior to Admission medications   Medication Sig Start Date End Date Taking? Authorizing Provider  amoxicillin (AMOXIL) 875 MG tablet Take 1 tablet (875 mg total) by mouth 2 (two) times daily for 5 days 12/17/22     benzonatate (TESSALON) 100 MG capsule Take 1 capsule (100 mg total) by mouth 3 (three) times daily as needed. 12/17/22     bisacodyl (DULCOLAX) 10 MG suppository Place 1 suppository (10 mg total) rectally once daily as needed for Constipation for up to 10 days 10/24/21     buPROPion (WELLBUTRIN XL) 150 MG 24 hr tablet Take 1 tablet (150 mg total) by mouth in the morning. 02/18/22     colchicine 0.6 MG tablet Take 1 tablet (0.6 mg) by mouth 2 times daily. 09/11/22   Rollene Rotunda, MD  FLUoxetine (PROZAC) 20 MG capsule Take 1 capsule by mouth once daily Patient not taking: Reported on 06/12/2022 08/07/20     gabapentin (NEURONTIN) 100 MG capsule Take 2 capsules (200 mg total) by mouth every 12 (twelve) hours. Patient not taking: Reported on 06/12/2022 10/02/21   Berna Bue, MD  LACTOBACILLUS PO Take by mouth.    [provider]  linaclotide Karlene Einstein) 145 MCG CAPS capsule Take 1 capsule (145 mcg total) by mouth daily 30 minutes before the first meal of the day on an empty stomach 11/25/22     Nutritional Supplements (VITAMIN D PLUS COFACTORS) TABS as directed Orally    [provider]  ondansetron (ZOFRAN-ODT) 4 MG disintegrating tablet Dissolve 1 tablet (4 mg total) by mouth every 6 (six) hours as needed for nausea or vomiting. Patient not taking: Reported on 06/12/2022 10/02/21   Berna Bue, MD  pantoprazole (PROTONIX) 40 MG tablet Take 1 tablet (40 mg total) by mouth daily. Take  this medication daily, regardless of reflux symptoms Patient not taking: Reported on 06/12/2022 10/02/21   Berna Bue, MD  pantoprazole (PROTONIX) 40 MG tablet Take 1 tablet (40 mg total) by mouth once daily 05/23/22     rosuvastatin (CRESTOR) 10 MG tablet Take 1 tablet by mouth once daily. 08/22/22     rosuvastatin (CRESTOR) 20 MG tablet Take 1 tablet (20 mg total) by mouth daily. 07/22/22 01/20/23  Rollene Rotunda, MD  Sodium Phosphates (ENEMA) 7-19 GM/118ML ENEM Place 1 enema rectally once for 1 dose Patient not taking: Reported on 06/12/2022 10/24/21     traMADol (ULTRAM) 50 MG tablet Take 1 tablet (50 mg total) by mouth every 6 (six) hours as needed (pain). Patient not taking: Reported on 06/12/2022 10/02/21   Berna Bue, MD      Allergies    Latex    Review of Systems   Review of Systems  Cardiovascular:  Positive for chest pain.    Physical Exam Updated Vital Signs BP (!) 137/93 (BP Location: Left Arm)   Pulse (!) 57   Temp 97.8 F (36.6 C) (Oral)   Resp 18   Ht 5\' 8"  (1.727 m)   Wt 90.3 kg   SpO2 100%   BMI 30.26 kg/m  Physical Exam  ED Results / Procedures / Treatments   Labs (all labs ordered are  listed, but only abnormal results are displayed) Labs Reviewed  CBC - Abnormal; Notable for the following components:      Result Value   Hemoglobin 11.7 (*)    All other components within normal limits  BASIC METABOLIC PANEL  TROPONIN I (HIGH SENSITIVITY)    EKG EKG Interpretation Date/Time:  Wednesday December 24 2022 22:10:00 EDT Ventricular Rate:  55 PR Interval:  159 QRS Duration:  82 QT Interval:  444 QTC Calculation: 425 R Axis:   35  Text Interpretation: Sinus rhythm Low voltage, precordial leads Abnormal R-wave progression, early transition No significant change since last tracing Confirmed by Melene Plan [phone]) on 12/24/2022 11:00:22 PM  Radiology DG Chest 2 View  Result Date: 12/24/2022 CLINICAL DATA:  Chest pain and pressure, initial  encounter EXAM: CHEST - 2 VIEW COMPARISON:  None Available. FINDINGS: The heart size and mediastinal contours are within normal limits. Both lungs are clear. The visualized skeletal structures are unremarkable. IMPRESSION: No active cardiopulmonary disease. Electronically Signed   By: Alcide Clever M.D.   On: 12/24/2022 23:02    Procedures Procedures  {Document cardiac monitor, telemetry assessment procedure when appropriate:1}  Medications Ordered in ED Medications  ketorolac (TORADOL) 15 MG/ML injection 15 mg (15 mg Intravenous Given 12/24/22 2306)    ED Course/ Medical Decision Making/ A&P   {   Click here for ABCD2, HEART and other calculatorsREFRESH Note before signing :1}                              Medical Decision Making Amount and/or Complexity of Data Reviewed Labs: ordered. Radiology: ordered.  Risk Prescription drug management.   ***  {Document critical care time when appropriate:1} {Document review of labs and clinical decision tools ie heart score, Chads2Vasc2 etc:1}  {Document your independent review of radiology images, and any outside records:1} {Document your discussion with family members, caretakers, and with consultants:1} {Document social determinants of health affecting pt's care:1} {Document your decision making why or why not admission, treatments were needed:1} Final Clinical Impression(s) / ED Diagnoses Final diagnoses:  None    Rx / DC Orders ED Discharge Orders     None

## 2022-12-25 ENCOUNTER — Other Ambulatory Visit: Payer: Self-pay

## 2022-12-25 ENCOUNTER — Other Ambulatory Visit (HOSPITAL_COMMUNITY): Payer: Self-pay

## 2022-12-25 ENCOUNTER — Emergency Department (HOSPITAL_COMMUNITY): Payer: Commercial Managed Care - PPO

## 2022-12-25 DIAGNOSIS — R0781 Pleurodynia: Secondary | ICD-10-CM | POA: Diagnosis not present

## 2022-12-25 DIAGNOSIS — I700 Atherosclerosis of aorta: Secondary | ICD-10-CM | POA: Diagnosis not present

## 2022-12-25 LAB — TROPONIN I (HIGH SENSITIVITY): Troponin I (High Sensitivity): 3 ng/L (ref <18)

## 2022-12-25 MED ORDER — INDOMETHACIN 25 MG PO CAPS
25.0000 mg | ORAL_CAPSULE | Freq: Three times a day (TID) | ORAL | 0 refills | Status: AC | PRN
Start: 2022-12-25 — End: 2023-01-08
  Filled 2022-12-25: qty 30, 10d supply, fill #0
  Filled 2022-12-25: qty 25, 9d supply, fill #0
  Filled 2022-12-25: qty 30, 10d supply, fill #0

## 2022-12-25 MED ORDER — IOHEXOL 350 MG/ML SOLN
80.0000 mL | Freq: Once | INTRAVENOUS | Status: AC | PRN
  Administered 2022-12-25: 80 mL via INTRAVENOUS

## 2022-12-25 NOTE — ED Notes (Signed)
Discharge instructions provided by edp were discussed with pt. Pt verbalized understanding with no additional questions at this time.  

## 2022-12-25 NOTE — Discharge Instructions (Signed)
You are seen in the emergency department today for chest pain.  Your workup was thankfully reassuring with no signs of any cardiac strain happening as her EKG does not show any abnormalities and troponin levels are not elevated.  Your chest x-ray and CT scan of your chest are also reassuring no signs of any pneumonia or any blood clot present.  I suspect your symptoms are likely due to pleuritis given your recent viral infection.  I have sent a prescription for indomethacin to your pharmacy to take over the next 2 weeks.  If you have any worsening in symptoms, return the emergency department but otherwise follow-up with your primary care provider.

## 2022-12-26 DIAGNOSIS — R091 Pleurisy: Secondary | ICD-10-CM | POA: Diagnosis not present

## 2022-12-30 ENCOUNTER — Ambulatory Visit: Payer: Commercial Managed Care - PPO | Attending: Cardiovascular Disease | Admitting: Cardiovascular Disease

## 2022-12-30 ENCOUNTER — Encounter: Payer: Self-pay | Admitting: Cardiovascular Disease

## 2022-12-30 ENCOUNTER — Telehealth: Payer: Self-pay | Admitting: Cardiology

## 2022-12-30 VITALS — BP 114/80 | HR 62 | Ht 68.75 in | Wt 200.2 lb

## 2022-12-30 DIAGNOSIS — J40 Bronchitis, not specified as acute or chronic: Secondary | ICD-10-CM

## 2022-12-30 DIAGNOSIS — I10 Essential (primary) hypertension: Secondary | ICD-10-CM

## 2022-12-30 DIAGNOSIS — R053 Chronic cough: Secondary | ICD-10-CM | POA: Diagnosis not present

## 2022-12-30 DIAGNOSIS — R0602 Shortness of breath: Secondary | ICD-10-CM | POA: Diagnosis not present

## 2022-12-30 DIAGNOSIS — R072 Precordial pain: Secondary | ICD-10-CM | POA: Diagnosis not present

## 2022-12-30 DIAGNOSIS — I300 Acute nonspecific idiopathic pericarditis: Secondary | ICD-10-CM | POA: Diagnosis not present

## 2022-12-30 NOTE — Telephone Encounter (Signed)
Please see previous information.

## 2022-12-30 NOTE — Telephone Encounter (Signed)
  Per MyChart scheduling message:   Pt c/o of Chest Pain: STAT if active CP, including tightness, pressure, jaw pain, radiating pain to shoulder/upper arm/back, CP unrelieved by Nitro. Symptoms reported of SOB, nausea, vomiting, sweating.  1. Are you having CP right now?    2. Are you experiencing any other symptoms (ex. SOB, nausea, vomiting, sweating)?   3. Is your CP continuous or coming and going?   4. Have you taken Nitroglycerin?   5. How long have you been experiencing CP?    6. If NO CP at time of call then end call with telling Pt to call back or call 911 if Chest pain returns prior to return call from triage team.   I have been having bad chest pains for three weeks and I was seen Oct 2/3 at [facility], given a prescription and it hasn't been doing anything, it's hard for me to breathe, it hurts when I lay down, cough, walk, sniff, etc..

## 2022-12-30 NOTE — Progress Notes (Addendum)
Cardiology Office Note    Date:  12/30/2022   ID:  Alicia Wood, DOB 1970/07/03, MRN [id number]  PCP:  Shon Hale, MD  Cardiologist:  Nicki Guadalajara, MD   Add on today for evaluation of current cough, shortness of breath, and chest discomfort.   History of Present Illness:  Alicia Wood is a 52 y.o. female who is followed by Dr. [name] for cardiology care.  He has remote history of pericarditis in 2017.  She was seen by Dr. [name] in March 2024 and prior to that evaluation was experiencing chest pain which she felt was similar to her pericarditis.  Pain was sharp.  It was associate with mild shortness of breath.  There were no ST segment changes.  Slowly her symptoms improved.  She developed similar recurrent symptoms and again tried her old dose of colchicine with plus minus benefit.  She was then evaluated by her primary physician who prescribed amoxicillin and benzonatate.  Due to continued symptomatology, she ultimately presented to the emergency room at [facility] where she works in December 24, 2022.  CT of her chest did not show any evidence for pulmonary embolism or active pulmonary disease.  At that time she was given a prescription for indomethacin 25 mg every 8 hours.  Alicia Wood has continued to experience symptomatology with continued cough and has chest wall tenderness.  She called the office today and was added onto my DOD schedule for evaluation.  She denies any fevers chills or night sweats.  She had recently been tested for COVID which was negative.   Past Medical History:  Diagnosis Date   Back pain    Chest pain    a. Initially in setting of pericarditis in 11/2015; b. Ongoing cp despite prednisone, colchicine, ibuprofen,and narcotic therapy;  c. 12/2015 CTA Chest: No PE, stable small-mod pericardial effusion;  d. 12/2015 Cath: nl cors.   Chest pain    Chronic diastolic CHF (congestive heart failure) (HCC)    a. 11/2015 Echo: EF 55-60%, grade  2 diastolic dysfxn.   Depression    GERD (gastroesophageal reflux disease)    Headache    Hyperlipidemia    Hypertension    Patient states she does not have hypertension.    Osteoarthritis    Pericardial effusion    a. 11/23/2015 Echo: mod pericardial effusion, no tamponade; b. 12/05/2015 Echo: small circumferential pericardial effusion; c. 12/06/2015 Cardiac MRI: pericardial thickening w/ circumferential late gad enhancement of pericardium and epicardium-->suspicious for subacute effusive constrictive pericarditis;  d. 12/19/2015 Echo: triv pericardial effusion;  e. 01/06/2016 CT Chest: small to mod pericard eff.   Pericarditis    a. 11/2015 Cardiac MRI; EF 57%, mild MR, triv TR, mild pericardial effusion, thickening of pericardium focally up to 8mm w/ significant circumferential late gad enhancement of pericardium and epicardium sugg of subacute effusive constrictive pericarditis;  b. 12/2015 R Heart Cath: nl right heart pressures w/o evidence of constrictive physiology.   Torn meniscus     Past Surgical History:  Procedure Laterality Date   ABDOMINAL HYSTERECTOMY     age 62   APPENDECTOMY     as a teen   CARDIAC CATHETERIZATION N/A 01/21/2016   Procedure: Right/Left Heart Cath and Coronary Angiography;  Surgeon: Kathleene Hazel, MD;  Location: [facility] INVASIVE CV LAB;  Service: Cardiovascular;  Laterality: N/A;   ESOPHAGOGASTRODUODENOSCOPY (EGD) WITH PROPOFOL N/A 01/30/2016   Procedure: ESOPHAGOGASTRODUODENOSCOPY (EGD) WITH PROPOFOL;  Surgeon: [name], MD;  Location: [facility] ENDOSCOPY;  Service: Endoscopy;  Laterality: N/A;   LAPAROSCOPIC GASTRIC SLEEVE RESECTION N/A 10/01/2021   Procedure: LAPAROSCOPIC SLEEVE GASTRECTOMY;  Surgeon: Berna Bue, MD;  Location: [facility] ORS;  Service: General;  Laterality: N/A;   TUBAL LIGATION  1992   UPPER GI ENDOSCOPY N/A 10/01/2021   Procedure: UPPER GI ENDOSCOPY;  Surgeon: Berna Bue, MD;  Location: [facility] ORS;  Service: General;  Laterality:  N/A;    Current Medications: Outpatient Medications Prior to Visit  Medication Sig Dispense Refill   amoxicillin (AMOXIL) 875 MG tablet Take 1 tablet (875 mg total) by mouth 2 (two) times daily for 5 days 10 tablet 0   buPROPion (WELLBUTRIN XL) 150 MG 24 hr tablet Take 1 tablet (150 mg total) by mouth in the morning. 90 tablet 3   colchicine 0.6 MG tablet Take 1 tablet (0.6 mg) by mouth 2 times daily. 180 tablet 0   indomethacin (INDOCIN) 25 MG capsule Take 1 capsule (25 mg total) by mouth 3 (three) times daily as needed for up to 14 days. 30 capsule 0   linaclotide (LINZESS) 145 MCG CAPS capsule Take 1 capsule (145 mcg total) by mouth daily 30 minutes before the first meal of the day on an empty stomach 90 capsule 2   pantoprazole (PROTONIX) 40 MG tablet Take 1 tablet (40 mg total) by mouth once daily 30 tablet 11   rosuvastatin (CRESTOR) 20 MG tablet Take 1 tablet (20 mg total) by mouth daily. 90 tablet 3   benzonatate (TESSALON) 100 MG capsule Take 1 capsule (100 mg total) by mouth 3 (three) times daily as needed. (Patient not taking: Reported on 12/24/2022) 30 capsule 0   bisacodyl (DULCOLAX) 10 MG suppository Place 1 suppository (10 mg total) rectally once daily as needed for Constipation for up to 10 days (Patient not taking: Reported on 12/24/2022) 10 suppository 0   FLUoxetine (PROZAC) 20 MG capsule Take 1 capsule by mouth once daily (Patient not taking: Reported on 06/12/2022) 90 capsule 3   gabapentin (NEURONTIN) 100 MG capsule Take 2 capsules (200 mg total) by mouth every 12 (twelve) hours. (Patient not taking: Reported on 06/12/2022) 20 capsule 0   LACTOBACILLUS PO Take by mouth. (Patient not taking: Reported on 12/24/2022)     Nutritional Supplements (VITAMIN D PLUS COFACTORS) TABS as directed Orally (Patient not taking: Reported on 12/24/2022)     ondansetron (ZOFRAN-ODT) 4 MG disintegrating tablet Dissolve 1 tablet (4 mg total) by mouth every 6 (six) hours as needed for nausea or  vomiting. (Patient not taking: Reported on 06/12/2022) 20 tablet 0   pantoprazole (PROTONIX) 40 MG tablet Take 1 tablet (40 mg total) by mouth daily. Take this medication daily, regardless of reflux symptoms (Patient not taking: Reported on 06/12/2022) 90 tablet 0   rosuvastatin (CRESTOR) 10 MG tablet Take 1 tablet by mouth once daily. (Patient not taking: Reported on 12/24/2022) 90 tablet 3   Sodium Phosphates (ENEMA) 7-19 GM/118ML ENEM Place 1 enema rectally once for 1 dose (Patient not taking: Reported on 06/12/2022) 118 mL 0   traMADol (ULTRAM) 50 MG tablet Take 1 tablet (50 mg total) by mouth every 6 (six) hours as needed (pain). (Patient not taking: Reported on 06/12/2022) 10 tablet 0   No facility-administered medications prior to visit.     Allergies:   Latex   Social History   Socioeconomic History   Marital status: Married    Spouse name: Not on file   Number of children: Not on file   Years of education:  Not on file   Highest education level: Not on file  Occupational History   Occupation: [employer]  Tobacco Use   Smoking status: Never   Smokeless tobacco: Never  Vaping Use   Vaping status: Never Used  Substance and Sexual Activity   Alcohol use: Yes    Comment: occasional   Drug use: No   Sexual activity: Not on file  Other Topics Concern   Not on file  Social History Narrative   Recently married November 2021   Social Determinants of Health   Financial Resource Strain: Not on file  Food Insecurity: No Food Insecurity (10/04/2021)   Hunger Vital Sign    Worried About Running Out of Food in the Last Year: Never true    Ran Out of Food in the Last Year: Never true  Transportation Needs: No Transportation Needs (10/04/2021)   PRAPARE - Administrator, Civil Service (Medical): No    Lack of Transportation (Non-Medical): No  Physical Activity: Not on file  Stress: Not on file  Social Connections: Unknown (08/05/2021)    Received from [facility]   Social Network    Social Network: Not on file     Family History:  The patient's family history includes Cancer in her mother; Depression in her mother; Healthy in her father; Hypertension in her mother.   ROS General: Negative; No fevers, chills, or night sweats; status post gastric sleeve surgery with approximate 100 pound weight loss. HEENT: Negative; No changes in vision or hearing, sinus congestion, difficulty swallowing Pulmonary: Positive for cough, no fevers. Cardiovascular: Chest wall tenderness GI: Negative; No nausea, vomiting, diarrhea, or abdominal pain GU: Negative; No dysuria, hematuria, or difficulty voiding Musculoskeletal: Negative; no myalgias, joint pain, or weakness Hematologic/Oncology: Negative; no easy bruising, bleeding Endocrine: Negative; no heat/cold intolerance; no diabetes Neuro: Negative; no changes in balance, headaches Skin: Negative; No rashes or skin lesions Psychiatric: Negative; No behavioral problems, depression Sleep: Negative; No snoring, daytime sleepiness, hypersomnolence, bruxism, restless legs, hypnogognic hallucinations, no cataplexy Other comprehensive 14 point system review is negative.   PHYSICAL EXAM:   VS:  BP 114/80   Pulse 62   Ht 5' 8.75" (1.746 m)   Wt 200 lb 3.2 oz (90.8 kg)   SpO2 96%   BMI 29.78 kg/m     Blood pressure by me was 120/76  Wt Readings from Last 3 Encounters:  12/30/22 200 lb 3.2 oz (90.8 kg)  10/17/22 209 lb 9.6 oz (95.1 kg)  06/12/22 216 lb (98 kg)    General: Alert, oriented, no distress.  Skin: normal turgor, no rashes, warm and dry HEENT: Normocephalic, atraumatic. Pupils equal round and reactive to light; sclera anicteric; extraocular muscles intact; Nose without nasal septal hypertrophy Mouth/Parynx benign; patient wearing a mask Neck: No JVD, no carotid bruits; normal carotid upstroke Lungs: clear to ausculatation and percussion; no wheezing or  rales Chest wall: Chest wall tenderness bilaterally in the costochondral region mimics her chest pain Heart: PMI not displaced, RRR, s1 s2 normal, 1/6 systolic murmur, no diastolic murmur, no rubs, gallops, thrills, or heaves Abdomen: soft, nontender; no hepatosplenomehaly, BS+; abdominal aorta nontender and not dilated by palpation. Back: no CVA tenderness Pulses 2+ Musculoskeletal: full range of motion, normal strength, no joint deformities Extremities: no clubbing cyanosis or edema, Homan's sign negative  Neurologic: grossly nonfocal; Cranial nerves grossly wnl Psychologic: Normal mood and affect   Studies/Labs Reviewed:   EKG Interpretation Date/Time:  Tuesday December 30 2022 15:54:52 EDT Ventricular Rate:  62 PR Interval:  154 QRS Duration:  70 QT Interval:  428 QTC Calculation: 434 R Axis:   21  Text Interpretation: Normal sinus rhythm Low voltage QRS Nonspecific T wave abnormality When compared with ECG of 24-Dec-2022 22:10, PREVIOUS ECG IS PRESENT Confirmed by Nicki Guadalajara ([id number]) on 12/30/2022 5:10:52 PM    I personally reviewed the ECG done in the ER on December 24, 2022 which shows bradycardia 55 bpm.  Probable to V3 lead versus old.  No acute changes.  No ST segment changes.  Recent Labs:    Latest Ref Rng & Units 12/24/2022   10:09 PM 10/02/2021    5:09 AM 09/26/2021   10:11 AM  BMP  Glucose 70 - 99 mg/dL 97  272  536   BUN 6 - 20 mg/dL 17  10  19    Creatinine 0.44 - 1.00 mg/dL 6.44  0.34  7.42   Sodium 135 - 145 mmol/L 140  141  141   Potassium 3.5 - 5.1 mmol/L 3.9  4.1  3.9   Chloride 98 - 111 mmol/L 107  105  109   CO2 22 - 32 mmol/L 27  27  25    Calcium 8.9 - 10.3 mg/dL 9.0  9.3  9.4         Latest Ref Rng & Units 10/02/2021    5:09 AM 09/26/2021   10:11 AM 06/08/2019   11:51 AM  Hepatic Function  Total Protein 6.5 - 8.1 g/dL 7.2  7.1  7.2   Albumin 3.5 - 5.0 g/dL 3.7  3.8  4.5   AST 15 - 41 U/L 19  18  18    ALT 0 - 44 U/L 17  16  16    Alk Phosphatase 38  - 126 U/L 80  80  100   Total Bilirubin 0.3 - 1.2 mg/dL 0.4  0.5  0.3        Latest Ref Rng & Units 12/24/2022   10:09 PM 10/02/2021    5:09 AM 09/26/2021   10:11 AM  CBC  WBC 4.0 - 10.5 K/uL 5.0  9.2  5.4   Hemoglobin 12.0 - 15.0 g/dL 59.5  63.8  75.6   Hematocrit 36.0 - 46.0 % 36.9  35.8  35.7   Platelets 150 - 400 K/uL 276  299  309    Lab Results  Component Value Date   MCV 90.9 12/24/2022   MCV 88.0 10/02/2021   MCV 88.4 09/26/2021   Lab Results  Component Value Date   TSH 0.624 06/08/2019   Lab Results  Component Value Date   HGBA1C 6.2 (H) 06/08/2019     BNP    Component Value Date/Time   BNP 38.3 02/04/2016 2047    ProBNP No results found for: "PROBNP"   Lipid Panel  No results found for: "CHOL", "TRIG", "HDL", "CHOLHDL", "VLDL", "LDLCALC", "LDLDIRECT", "LABVLDL"   RADIOLOGY: CT Angio Chest PE W and/or Wo Contrast  Result Date: 12/25/2022 CLINICAL DATA:  Pulmonary embolus suspected with high probability. History of pericarditis. Chest and rib pain starting 7 days ago. EXAM: CT ANGIOGRAPHY CHEST WITH CONTRAST TECHNIQUE: Multidetector CT imaging of the chest was performed using the standard protocol during bolus administration of intravenous contrast. Multiplanar CT image reconstructions and MIPs were obtained to evaluate the vascular anatomy. RADIATION DOSE REDUCTION: This exam was performed according to the departmental dose-optimization program which includes automated exposure control, adjustment of the mA and/or kV according to patient size  and/or use of iterative reconstruction technique. CONTRAST:  80mL OMNIPAQUE IOHEXOL 350 MG/ML SOLN COMPARISON:  Chest radiograph 12/24/2022.  CT chest 01/06/2016 FINDINGS: Cardiovascular: Good opacification of the central and segmental pulmonary arteries with mild motion artifact. No focal filling defects. No evidence of significant pulmonary embolus. Normal heart size. No pericardial effusions. Normal caliber thoracic aorta.  Scattered aortic calcification. Mediastinum/Nodes: Esophagus is decompressed. No significant lymphadenopathy. Thyroid gland is unremarkable. Lungs/Pleura: Lungs are clear. No pleural effusions. No pneumothorax. Upper Abdomen: Postoperative changes consistent with gastric bypass. No acute abnormalities. Musculoskeletal: Small soft tissue gas collections are demonstrated anteriorly, likely representing intravenous gas arising from intravenous injections. Degenerative changes in the spine. No acute bony abnormalities. Review of the MIP images confirms the above findings. IMPRESSION: 1. No evidence of significant pulmonary embolus. 2. No evidence of active pulmonary disease. Electronically Signed   By: Burman Nieves M.D.   On: 12/25/2022 01:01   DG Chest 2 View  Result Date: 12/24/2022 CLINICAL DATA:  Chest pain and pressure, initial encounter EXAM: CHEST - 2 VIEW COMPARISON:  None Available. FINDINGS: The heart size and mediastinal contours are within normal limits. Both lungs are clear. The visualized skeletal structures are unremarkable. IMPRESSION: No active cardiopulmonary disease. Electronically Signed   By: Alcide Clever M.D.   On: 12/24/2022 23:02     Additional studies/ records that were reviewed today include:   I reviewed the cardiology records of Dr. [name].  [facility], ER evaluation was reviewed.    ASSESSMENT:    1. Precordial chest pain: probable costochondritis   2. Essential hypertension   3. Idiopathic pericarditis, unspecified chronicity   4. SOB (shortness of breath)   5. Chronic cough   6. 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  Has a history of prior obesity and is status post gastric sleeve bariatric surgery with weight loss from 301 down to 200 pounds.  In 2017, she had an episode of pericarditis and did derive some benefit from colchicine.  In March 2024 she again  experienced some chest pain which she felt was similar to her pericarditis.  She had temporarily resumed taking colchicine with plus minus benefit.  The echo Doppler study to 22 2024 showed normal LV function with EF 55 to 60% with normal strain pattern.  There is minimal dilation of ascending aorta at 40 mm.  Over the past several weeks has developed recurrent symptomatology with increased episodes of coughing.  She had seen her primary physician and was given a prescription for oxacillin in addition to benzonatate.  Due to recurrent symptomatology not improved she presented to [facility] emergency room.  CT was negative.  Chemistry was normal.  Troponin was negative.  She was given a prescription for indomethacin 5 mg 3 times a day.  Over the past week, she continues frequent coughing spells.  She admits to tenderness in the anterior chest and has definite chest wall tenderness bilaterally over the costochondral region suggesting a component of costochondritis.  I reviewed her ECG from her ER evaluation which was unremarkable ECG from today is not significantly changed.  I suspect she has chest wall pain from increased inflammation and may very well have developed some bronchitis.  I have recommended she try slightly increasing indomethacin from 25 mg every 8 hours to 50 mg twice a day for several days.  I have recommended initiation of 1 FS and 600  mg and also have recommended initiation of Delsym cough medicine.  I am scheduling her for a follow-up echo Doppler study to make certain there is no significant interval change since her prior echo.  Recommending follow-up laboratory with a comprehensive metabolic panel, CBC, erythrocyte sedimentation rate as well as CRP.  She will follow-up with her primary MD and Dr. [name].   Medication Adjustments/Labs and Tests Ordered: Current medicines are reviewed at length with the patient today.  Concerns regarding medicines are outlined above.  Medication changes,  Labs and Tests ordered today are listed in the Patient Instructions below. Patient Instructions  Medication Instructions:  Continue the indomethacin (INDOCIN) 25 MG capsule. Take 2 tablets (50MG ) 2 times a day.  Purchase Mucinex and Delysym cough syrup  at your local pharmacy  *If you need a refill on your cardiac medications before your next appointment, please call your pharmacy*   Lab Work: CMET, CBC, SED RATE, C-REACTIVE PROTEIN If you have labs (blood work) drawn today and your tests are completely normal, you will receive your results only by: MyChart Message (if you have MyChart) OR A paper copy in the mail If you have any lab test that is abnormal or we need to change your treatment, we will call you to review the results.   Testing/Procedures: Your physician has requested that you have an echocardiogram. Echocardiography is a painless test that uses sound waves to create images of your heart. It provides your doctor with information about the size and shape of your heart and how well your heart's chambers and valves are working. This procedure takes approximately one hour. There are no restrictions for this procedure. Please do NOT wear cologne, perfume, aftershave, or lotions (deodorant is allowed). Please arrive 15 minutes prior to your appointment time.    Follow-Up: At [facility], you and your health needs are our priority.  As part of our continuing mission to provide you with exceptional heart care, we have created designated Provider Care Teams.  These Care Teams include your primary Cardiologist (physician) and Advanced Practice Providers (APPs -  Physician Assistants and Nurse Practitioners) who all work together to provide you with the care you need, when you need it.  We recommend signing up for the patient portal called "MyChart".  Sign up information is provided on this After Visit Summary.  MyChart is used to connect with patients for Virtual Visits  (Telemedicine).  Patients are able to view lab/test results, encounter notes, upcoming appointments, etc.  Non-urgent messages can be sent to your provider as well.    To learn more about what you can do with MyChart, go to [email or web address].    Your next appointment:    FOLLOW UP AFTER THE ECHO PROCEDURE WITH DR. HOCHREIN  Provider:   Rollene Rotunda, MD        Signed, Nicki Guadalajara, MD  12/30/2022 5:03 PM    [facility] [address], [place], [place]  [address] Phone: [phone]

## 2022-12-30 NOTE — Patient Instructions (Addendum)
Medication Instructions:  Continue the indomethacin (INDOCIN) 25 MG capsule. Take 2 tablets (50MG ) 2 times a day.  Purchase Mucinex and Delysym cough syrup  at your local pharmacy  *If you need a refill on your cardiac medications before your next appointment, please call your pharmacy*   Lab Work: CMET, CBC, SED RATE, C-REACTIVE PROTEIN If you have labs (blood work) drawn today and your tests are completely normal, you will receive your results only by: MyChart Message (if you have MyChart) OR A paper copy in the mail If you have any lab test that is abnormal or we need to change your treatment, we will call you to review the results.   Testing/Procedures: Your physician has requested that you have an echocardiogram. Echocardiography is a painless test that uses sound waves to create images of your heart. It provides your doctor with information about the size and shape of your heart and how well your heart's chambers and valves are working. This procedure takes approximately one hour. There are no restrictions for this procedure. Please do NOT wear cologne, perfume, aftershave, or lotions (deodorant is allowed). Please arrive 15 minutes prior to your appointment time.    Follow-Up: At [facility], you and your health needs are our priority.  As part of our continuing mission to provide you with exceptional heart care, we have created designated Provider Care Teams.  These Care Teams include your primary Cardiologist (physician) and Advanced Practice Providers (APPs -  Physician Assistants and Nurse Practitioners) who all work together to provide you with the care you need, when you need it.  We recommend signing up for the patient portal called "MyChart".  Sign up information is provided on this After Visit Summary.  MyChart is used to connect with patients for Virtual Visits (Telemedicine).  Patients are able to view lab/test results, encounter notes, upcoming appointments, etc.   Non-urgent messages can be sent to your provider as well.    To learn more about what you can do with MyChart, go to [email or web address].    Your next appointment:    FOLLOW UP AFTER THE ECHO PROCEDURE WITH DR. HOCHREIN  Provider:   Rollene Rotunda, MD

## 2022-12-30 NOTE — Telephone Encounter (Signed)
Chest is tight "all up in there"  She states the medication given is not helping at all.  Hurts to breath, walk, etc as mentioned.  Non radiating but states it is tight all the way up to her neck.  She states difficulty with breathing, has to stop "takes my breath"  Advised an appt today at 4:00 with DOD. She is scheduled today.

## 2022-12-31 DIAGNOSIS — I10 Essential (primary) hypertension: Secondary | ICD-10-CM | POA: Diagnosis not present

## 2022-12-31 DIAGNOSIS — R072 Precordial pain: Secondary | ICD-10-CM | POA: Diagnosis not present

## 2022-12-31 DIAGNOSIS — I5032 Chronic diastolic (congestive) heart failure: Secondary | ICD-10-CM | POA: Diagnosis not present

## 2022-12-31 DIAGNOSIS — R0602 Shortness of breath: Secondary | ICD-10-CM | POA: Diagnosis not present

## 2023-01-01 LAB — COMPREHENSIVE METABOLIC PANEL
ALT: 18 [IU]/L (ref 0–32)
AST: 19 [IU]/L (ref 0–40)
Albumin: 4.3 g/dL (ref 3.8–4.9)
Alkaline Phosphatase: 91 [IU]/L (ref 44–121)
BUN/Creatinine Ratio: 24 — ABNORMAL HIGH (ref 9–23)
BUN: 25 mg/dL — ABNORMAL HIGH (ref 6–24)
Bilirubin Total: 0.2 mg/dL (ref 0.0–1.2)
CO2: 23 mmol/L (ref 20–29)
Calcium: 9.7 mg/dL (ref 8.7–10.2)
Chloride: 105 mmol/L (ref 96–106)
Creatinine, Ser: 1.06 mg/dL — ABNORMAL HIGH (ref 0.57–1.00)
Globulin, Total: 2.4 g/dL (ref 1.5–4.5)
Glucose: 96 mg/dL (ref 70–99)
Potassium: 4.6 mmol/L (ref 3.5–5.2)
Sodium: 142 mmol/L (ref 134–144)
Total Protein: 6.7 g/dL (ref 6.0–8.5)
eGFR: 62 mL/min/{1.73_m2} (ref >59)

## 2023-01-01 LAB — CBC
Hematocrit: 37.5 % (ref 34.0–46.6)
Hemoglobin: 12 g/dL (ref 11.1–15.9)
MCH: 28.8 pg (ref 26.6–33.0)
MCHC: 32 g/dL (ref 31.5–35.7)
MCV: 90 fL (ref 79–97)
Platelets: 315 10*3/uL (ref 150–450)
RBC: 4.17 x10E6/uL (ref 3.77–5.28)
RDW: 14 % (ref 11.7–15.4)
WBC: 4.2 10*3/uL (ref 3.4–10.8)

## 2023-01-01 LAB — C-REACTIVE PROTEIN: CRP: 1 mg/L (ref 0–10)

## 2023-01-01 LAB — SEDIMENTATION RATE: Sed Rate: 8 mm/h (ref 0–40)

## 2023-01-05 ENCOUNTER — Other Ambulatory Visit (HOSPITAL_COMMUNITY): Payer: Self-pay

## 2023-01-05 DIAGNOSIS — R0602 Shortness of breath: Secondary | ICD-10-CM | POA: Diagnosis not present

## 2023-01-05 MED ORDER — ALBUTEROL SULFATE HFA 108 (90 BASE) MCG/ACT IN AERS
2.0000 | INHALATION_SPRAY | RESPIRATORY_TRACT | 0 refills | Status: DC | PRN
  Filled 2023-01-05: qty 6.7, 21d supply, fill #0

## 2023-01-05 MED ORDER — PREDNISONE 50 MG PO TABS
50.0000 mg | ORAL_TABLET | Freq: Every day | ORAL | 0 refills | Status: DC
  Filled 2023-01-05: qty 5, 5d supply, fill #0

## 2023-01-08 ENCOUNTER — Ambulatory Visit: Payer: Commercial Managed Care - PPO | Attending: Nurse Practitioner | Admitting: Nurse Practitioner

## 2023-01-08 NOTE — Progress Notes (Deleted)
Office Visit    Patient Name: Alicia Wood Date of Encounter: 01/08/2023  Primary Care Provider:  Shon Hale, MD Primary Cardiologist:  Rollene Rotunda, MD  Chief Complaint    52 year old female with a history of remote pericarditis, precordial chest pain, mild dilation of the ascending aorta, hypertension, shortness of breath, chronic cough, and obesity s/p gastric sleeve bariatric surgery who presents for follow-up related to chest pain and shortness of breath.  Past Medical History    Past Medical History:  Diagnosis Date   Back pain    Chest pain    a. Initially in setting of pericarditis in 11/2015; b. Ongoing cp despite prednisone, colchicine, ibuprofen,and narcotic therapy;  c. 12/2015 CTA Chest: No PE, stable small-mod pericardial effusion;  d. 12/2015 Cath: nl cors.   Chest pain    Chronic diastolic CHF (congestive heart failure) (HCC)    a. 11/2015 Echo: EF 55-60%, grade 2 diastolic dysfxn.   Depression    GERD (gastroesophageal reflux disease)    Headache    Hyperlipidemia    Hypertension    Patient states she does not have hypertension.    Osteoarthritis    Pericardial effusion    a. 11/23/2015 Echo: mod pericardial effusion, no tamponade; b. 12/05/2015 Echo: small circumferential pericardial effusion; c. 12/06/2015 Cardiac MRI: pericardial thickening w/ circumferential late gad enhancement of pericardium and epicardium-->suspicious for subacute effusive constrictive pericarditis;  d. 12/19/2015 Echo: triv pericardial effusion;  e. 01/06/2016 CT Chest: small to mod pericard eff.   Pericarditis    a. 11/2015 Cardiac MRI; EF 57%, mild MR, triv TR, mild pericardial effusion, thickening of pericardium focally up to 8mm w/ significant circumferential late gad enhancement of pericardium and epicardium sugg of subacute effusive constrictive pericarditis;  b. 12/2015 R Heart Cath: nl right heart pressures w/o evidence of constrictive physiology.   Torn meniscus     Past Surgical History:  Procedure Laterality Date   ABDOMINAL HYSTERECTOMY     age 18   APPENDECTOMY     as a teen   CARDIAC CATHETERIZATION N/A 01/21/2016   Procedure: Right/Left Heart Cath and Coronary Angiography;  Surgeon: Kathleene Hazel, MD;  Location: [facility] INVASIVE CV LAB;  Service: Cardiovascular;  Laterality: N/A;   ESOPHAGOGASTRODUODENOSCOPY (EGD) WITH PROPOFOL N/A 01/30/2016   Procedure: ESOPHAGOGASTRODUODENOSCOPY (EGD) WITH PROPOFOL;  Surgeon: [name], MD;  Location: [facility] ENDOSCOPY;  Service: Endoscopy;  Laterality: N/A;   LAPAROSCOPIC GASTRIC SLEEVE RESECTION N/A 10/01/2021   Procedure: LAPAROSCOPIC SLEEVE GASTRECTOMY;  Surgeon: Berna Bue, MD;  Location: [facility] ORS;  Service: General;  Laterality: N/A;   TUBAL LIGATION  1992   UPPER GI ENDOSCOPY N/A 10/01/2021   Procedure: UPPER GI ENDOSCOPY;  Surgeon: Berna Bue, MD;  Location: [facility] ORS;  Service: General;  Laterality: N/A;    Allergies  Allergies  Allergen Reactions   Latex Hives    Powdered      Labs/Other Studies Reviewed    The following studies were reviewed today:  Cardiac Studies & Procedures   CARDIAC CATHETERIZATION  CARDIAC CATHETERIZATION 01/21/2016  Narrative 1. No angiographic evidence of CAD. 2. Normal filling pressures. 3. No hemodynamic findings that are suggestive of constrictive physiology.  Findings Coronary Findings Diagnostic  Dominance: Right  Left Anterior Descending Vessel is large. Vessel is angiographically normal.  First Diagonal Branch Vessel is small in size.  Second Diagonal Branch Vessel is moderate in size.  Third Diagonal Branch Vessel is small in size.  Ramus Intermedius Vessel is small.  Left Circumflex Vessel is large. Vessel is angiographically normal.  First Obtuse Marginal Branch Vessel is large in size.  Second Obtuse Marginal Branch Vessel is small in size.  Third Obtuse Marginal Branch Vessel is large in size.  Right  Coronary Artery Vessel is large. Vessel is angiographically normal.  Right Posterior Descending Artery Vessel is moderate in size.  Intervention  No interventions have been documented.     ECHOCARDIOGRAM  ECHOCARDIOGRAM COMPLETE 06/13/2022  Narrative ECHOCARDIOGRAM REPORT    Patient Name:   Alicia Wood Date of Exam: 06/13/2022 Medical Rec #:  [id number]              Height:       68.7 in Accession #:    [id number]             Weight:       216.0 lb Date of Birth:  [birth year]               BSA:          2.129 m Patient Age:    52 years               BP:           108/76 mmHg Patient Gender: F                      HR:           55 bpm. Exam Location:  [address]  Procedure: 2D Echo, Color Doppler, Cardiac Doppler, 3D Echo and Strain Analysis  Indications:    R07.9* Chest pain, unspecified  History:        Patient has prior history of Echocardiogram examinations, most recent 02/14/2020. CHF; Risk Factors:Hypertension and Dyslipidemia.  Sonographer:    Thurman Coyer RDCS Referring Phys: [id number] JAMES HOCHREIN  IMPRESSIONS   1. Left ventricular ejection fraction, by estimation, is 55 to 60%. Left ventricular ejection fraction by 3D volume is 58 %. The left ventricle has normal function. The left ventricle has no regional wall motion abnormalities. Left ventricular diastolic parameters were normal. The average left ventricular global longitudinal strain is -21.5 %. The global longitudinal strain is normal. 2. Right ventricular systolic function is normal. The right ventricular size is normal. There is normal pulmonary artery systolic pressure. 3. Left atrial size was mildly dilated. 4. The mitral valve is normal in structure. Trivial mitral valve regurgitation. No evidence of mitral stenosis. 5. The aortic valve is tricuspid. Aortic valve regurgitation is trivial. No aortic stenosis is present. 6. Aortic dilatation noted. There is mild dilatation of the ascending  aorta, measuring 40 mm. 7. The inferior vena cava is normal in size with <50% respiratory variability, suggesting right atrial pressure of 8 mmHg.  FINDINGS Left Ventricle: Left ventricular ejection fraction, by estimation, is 55 to 60%. Left ventricular ejection fraction by 3D volume is 58 %. The left ventricle has normal function. The left ventricle has no regional wall motion abnormalities. The average left ventricular global longitudinal strain is -21.5 %. The global longitudinal strain is normal. The left ventricular internal cavity size was normal in size. There is no left ventricular hypertrophy. Left ventricular diastolic parameters were normal. Indeterminate filling pressures.  Right Ventricle: The right ventricular size is normal. No increase in right ventricular wall thickness. Right ventricular systolic function is normal. There is normal pulmonary artery systolic pressure. The tricuspid regurgitant velocity is 1.32 m/s, and with an assumed right atrial pressure of 8 mmHg, the  estimated right ventricular systolic pressure is 15.0 mmHg.  Left Atrium: Left atrial size was mildly dilated.  Right Atrium: Right atrial size was normal in size.  Pericardium: There is no evidence of pericardial effusion.  Mitral Valve: The mitral valve is normal in structure. Trivial mitral valve regurgitation. No evidence of mitral valve stenosis.  Tricuspid Valve: The tricuspid valve is normal in structure. Tricuspid valve regurgitation is trivial. No evidence of tricuspid stenosis.  Aortic Valve: The aortic valve is tricuspid. Aortic valve regurgitation is trivial. No aortic stenosis is present.  Pulmonic Valve: The pulmonic valve was normal in structure. Pulmonic valve regurgitation is trivial. No evidence of pulmonic stenosis.  Aorta: Aortic dilatation noted. There is mild dilatation of the ascending aorta, measuring 40 mm.  Venous: The inferior vena cava is normal in size with less than 50%  respiratory variability, suggesting right atrial pressure of 8 mmHg.  IAS/Shunts: No atrial level shunt detected by color flow Doppler.   LEFT VENTRICLE PLAX 2D LVIDd:         4.90 cm         Diastology LVIDs:         3.40 cm         LV e' medial:    9.14 cm/s LV PW:         0.80 cm         LV E/e' medial:  10.4 LV IVS:        0.60 cm         LV e' lateral:   12.40 cm/s LVOT diam:     2.00 cm         LV E/e' lateral: 7.7 LV SV:         69 LV SV Index:   32              2D LVOT Area:     3.14 cm        Longitudinal Strain 2D Strain GLS  -23.8 % (A2C): 2D Strain GLS  -21.4 % (A3C): 2D Strain GLS  -19.3 % (A4C): 2D Strain GLS  -21.5 % Avg:  3D Volume EF LV 3D EF:    Left ventricul ar ejection fraction by 3D volume is 58 %.  3D Volume EF: 3D EF:        58 % LV EDV:       143 ml LV ESV:       60 ml LV SV:        82 ml  RIGHT VENTRICLE RV Basal diam:  3.20 cm RV Mid diam:    3.70 cm RV S prime:     10.90 cm/s TAPSE (M-mode): 2.2 cm  LEFT ATRIUM             Index        RIGHT ATRIUM           Index LA diam:        3.60 cm 1.69 cm/m   RA Area:     18.10 cm LA Vol (A2C):   67.3 ml 31.62 ml/m  RA Volume:   46.80 ml  21.99 ml/m LA Vol (A4C):   71.3 ml 33.50 ml/m LA Biplane Vol: 71.3 ml 33.50 ml/m AORTIC VALVE LVOT Vmax:   90.10 cm/s LVOT Vmean:  60.800 cm/s LVOT VTI:    0.219 m  AORTA Ao Root diam: 3.10 cm Ao Asc diam:  4.00 cm  MITRAL VALVE  TRICUSPID VALVE MV Area (PHT): 3.48 cm    TR Peak grad:   7.0 mmHg MV Decel Time: 218 msec    TR Vmax:        132.00 cm/s MV E velocity: 94.90 cm/s MV A velocity: 79.70 cm/s  SHUNTS MV E/A ratio:  1.19        Systemic VTI:  0.22 m Systemic Diam: 2.00 cm  Chilton Si MD Electronically signed by Chilton Si MD Signature Date/Time: 06/13/2022/1:06:29 PM    Final      CARDIAC MRI  MR CARDIAC MORPHOLOGY W WO CONTRAST 12/06/2015  Narrative CLINICAL DATA:  52 year old female with  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  EXAM: CARDIAC MRI  TECHNIQUE: The patient was scanned on a 1.5 Tesla GE magnet. A dedicated cardiac coil was used. Functional imaging was done using Fiesta sequences. 2,3, and 4 chamber views were done to assess for RWMA's. Modified Simpson's rule using a short axis stack was used to calculate an ejection fraction on a dedicated work [employer]. The patient received 28 cc of Multihance. After 10 minutes inversion recovery sequences were used to assess for infiltration and scar tissue.  CONTRAST:  28 cc  of Multihance  FINDINGS: 1. Normal left ventricular size, thickness and systolic function (LVEF = 57%) with no regional wall motion abnormalities.  LVEDD; 54 mm  LVESD:  38 mm  LVEDV:  157 ml  LVESV:  76 ml  SV:  89 ml  CO:  7.0 L/min  2. Normal right ventricular size, thickness and systolic function (RVEF = 55%) with no regional wall motion abnormalities.  3.  Mild left atrial dilatation.  4.  Mild mitral and trivial tricuspid regurgitation.  5. Normal size of the aortic root, ascending aorta and pulmonary artery.  6. There is mild amount of pericardial effusion predominantly in the proximity to the right ventricle.  7. There is thickening of the pericardium focally up to 8 mm, and significant circumferential late gadolinium enhancement of the pericardium and epicardium.  There is IVC dilatation with no respiratory variation.  There is also double bounce of the interventricular septum in diastole.  IMPRESSION: 1. Normal left ventricular size, thickness and systolic function (LVEF = 57%) with no regional wall motion abnormalities.  2. Normal right ventricular size, thickness and systolic function (RVEF = 55%) with no regional wall motion abnormalities.  3.  Mild left atrial dilatation.  4.  Mild mitral and trivial tricuspid regurgitation.  5. There is mild amount of  pericardial effusion predominantly in the proximity to the right ventricle.  6. There is thickening of the pericardium focally up to 8 mm, and significant circumferential late gadolinium enhancement of the pericardium and epicardium. An IVC dilatation with no respiratory variation is seen.  There is also double bounce of the interventricular septum in diastole.  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 A right sided cardiac cath is recommended.  Tobias Alexander   Electronically Signed By: Tobias Alexander On: 12/06/2015 15:04        Recent Labs: 12/31/2022: ALT 18; BUN 25; Creatinine, Ser 1.06; Hemoglobin 12.0; Platelets 315; Potassium 4.6; Sodium 142  Recent Lipid Panel No results found for: "CHOL", "TRIG", "HDL", "CHOLHDL", "VLDL", "LDLCALC", "LDLDIRECT"  History of Present Illness    52 year old female with the above past medical history including remote pericarditis, precordial chest pain, mild dilation of the ascending aorta, hypertension, shortness of breath, chronic cough, and obesity s/p  gastric sleeve bariatric surgery.  She has a history of obesity s/p gastric sleeve bariatric surgery with weight loss of over 100 pounds.  She was evaluated by Dr. [name] in 2017 in the setting of pericarditis.  She was treated with colchicine.  In March 2024 she again experienced similar chest pain.  She temporarily resumed colchicine with minimal benefit.  Echocardiogram at the time showed normal LV function, EF 55 to 60%, normal strain pattern, mild dilation of ascending aorta measuring 40 mm.  She was last seen in the office on 12/30/2022 for recurrent symptomology including increased shortness of breath, episodes of coughing, and precordial chest pain, suspicious for costochondritis.  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  Troponin was negative, CTA chest was negative.   EKG was unremarkable.  She was advised to increase her dose of indomethacin.  ESR and CRP were normal.  Follow-up echocardiogram was ordered and is pending.  She presents today for follow-up.  Since her last visit  Chest pain/shortness of breath/history of pericarditis: Cough: Hypertension: Dilation of ascending aorta: Obesity:  Disposition:   Home Medications    Current Outpatient Medications  Medication Sig Dispense Refill   albuterol (VENTOLIN HFA) 108 (90 Base) MCG/ACT inhaler Inhale 2 puffs into the lungs every 4 (four) hours as needed. 6.7 g 0   amoxicillin (AMOXIL) 875 MG tablet Take 1 tablet (875 mg total) by mouth 2 (two) times daily for 5 days 10 tablet 0   benzonatate (TESSALON) 100 MG capsule Take 1 capsule (100 mg total) by mouth 3 (three) times daily as needed. (Patient not taking: Reported on 12/24/2022) 30 capsule 0   bisacodyl (DULCOLAX) 10 MG suppository Place 1 suppository (10 mg total) rectally once daily as needed for Constipation for up to 10 days (Patient not taking: Reported on 12/24/2022) 10 suppository 0   buPROPion (WELLBUTRIN XL) 150 MG 24 hr tablet Take 1 tablet (150 mg total) by mouth in the morning. 90 tablet 3   colchicine 0.6 MG tablet Take 1 tablet (0.6 mg) by mouth 2 times daily. 180 tablet 0   FLUoxetine (PROZAC) 20 MG capsule Take 1 capsule by mouth once daily (Patient not taking: Reported on 06/12/2022) 90 capsule 3   gabapentin (NEURONTIN) 100 MG capsule Take 2 capsules (200 mg total) by mouth every 12 (twelve) hours. (Patient not taking: Reported on 06/12/2022) 20 capsule 0   indomethacin (INDOCIN) 25 MG capsule Take 1 capsule (25 mg total) by mouth 3 (three) times daily as needed for up to 14 days. 30 capsule 0   LACTOBACILLUS PO Take by mouth. (Patient not taking: Reported on 12/24/2022)     linaclotide (LINZESS) 145 MCG CAPS capsule Take 1 capsule (145 mcg total) by mouth daily 30 minutes before the first meal of the day on an empty stomach 90 capsule  2   Nutritional Supplements (VITAMIN D PLUS COFACTORS) TABS as directed Orally (Patient not taking: Reported on 12/24/2022)     ondansetron (ZOFRAN-ODT) 4 MG disintegrating tablet Dissolve 1 tablet (4 mg total) by mouth every 6 (six) hours as needed for nausea or vomiting. (Patient not taking: Reported on 06/12/2022) 20 tablet 0   pantoprazole (PROTONIX) 40 MG tablet Take 1 tablet (40 mg total) by mouth daily. Take this medication daily, regardless of reflux symptoms (Patient not taking: Reported on 06/12/2022) 90 tablet 0   pantoprazole (PROTONIX) 40 MG tablet Take 1 tablet (40 mg total) by mouth  once daily 30 tablet 11   predniSONE (DELTASONE) 50 MG tablet Take 1 tablet (50 mg total) by mouth daily for 5 days. 5 tablet 0   rosuvastatin (CRESTOR) 10 MG tablet Take 1 tablet by mouth once daily. (Patient not taking: Reported on 12/24/2022) 90 tablet 3   rosuvastatin (CRESTOR) 20 MG tablet Take 1 tablet (20 mg total) by mouth daily. 90 tablet 3   Sodium Phosphates (ENEMA) 7-19 GM/118ML ENEM Place 1 enema rectally once for 1 dose (Patient not taking: Reported on 06/12/2022) 118 mL 0   traMADol (ULTRAM) 50 MG tablet Take 1 tablet (50 mg total) by mouth every 6 (six) hours as needed (pain). (Patient not taking: Reported on 06/12/2022) 10 tablet 0   No current facility-administered medications for this visit.     Review of Systems    ***.  All other systems reviewed and are otherwise negative except as noted above.    Physical Exam    VS:  There were no vitals taken for this visit. , BMI There is no height or weight on file to calculate BMI.     GEN: Well nourished, well developed, in no acute distress. HEENT: normal. Neck: Supple, no JVD, carotid bruits, or masses. Cardiac: RRR, no murmurs, rubs, or gallops. No clubbing, cyanosis, edema.  Radials/DP/PT 2+ and equal bilaterally.  Respiratory:  Respirations regular and unlabored, clear to auscultation bilaterally. GI: Soft, nontender, nondistended, BS  + x 4. MS: no deformity or atrophy. Skin: warm and dry, no rash. Neuro:  Strength and sensation are intact. Psych: Normal affect.  Accessory Clinical Findings    ECG personally reviewed by me today -    - no acute changes.   Lab Results  Component Value Date   WBC 4.2 12/31/2022   HGB 12.0 12/31/2022   HCT 37.5 12/31/2022   MCV 90 12/31/2022   PLT 315 12/31/2022   Lab Results  Component Value Date   CREATININE 1.06 (H) 12/31/2022   BUN 25 (H) 12/31/2022   NA 142 12/31/2022   K 4.6 12/31/2022   CL 105 12/31/2022   CO2 23 12/31/2022   Lab Results  Component Value Date   ALT 18 12/31/2022   AST 19 12/31/2022   ALKPHOS 91 12/31/2022   BILITOT 0.2 12/31/2022   No results found for: "CHOL", "HDL", "LDLCALC", "LDLDIRECT", "TRIG", "CHOLHDL"  Lab Results  Component Value Date   HGBA1C 6.2 (H) 06/08/2019    Assessment & Plan    1.  ***  No BP recorded.  {Refresh Note OR Click here to enter BP  :1}***   Joylene Grapes, NP 01/08/2023, 6:29 AM

## 2023-01-19 ENCOUNTER — Ambulatory Visit (HOSPITAL_COMMUNITY): Payer: Commercial Managed Care - PPO | Attending: Cardiovascular Disease

## 2023-01-19 DIAGNOSIS — I10 Essential (primary) hypertension: Secondary | ICD-10-CM | POA: Insufficient documentation

## 2023-01-19 DIAGNOSIS — R0602 Shortness of breath: Secondary | ICD-10-CM | POA: Insufficient documentation

## 2023-01-19 DIAGNOSIS — R072 Precordial pain: Secondary | ICD-10-CM | POA: Insufficient documentation

## 2023-01-19 LAB — ECHOCARDIOGRAM COMPLETE
Area-P 1/2: 3.07 cm2
S' Lateral: 2.9 cm

## 2023-01-20 ENCOUNTER — Ambulatory Visit: Payer: Commercial Managed Care - PPO | Admitting: Dietician

## 2023-01-27 NOTE — Progress Notes (Deleted)
Cardiology Office Note:   Date:  01/27/2023  ID:  Chauncey Reading, DOB 09-Jul-1970, MRN [id number] PCP: Shon Hale, MD   [facility] Providers Cardiologist:  Rollene Rotunda, MD {  History of Present Illness:   Alicia Wood is a 52 y.o. female who was referred by Shon Hale, MD for evaluation of chest pain.   She has had a history pericarditis in 2017.   She had recurrent symptoms earlier this year.   She was in the ED for this and was thought to have chostochondritis.    ***   ***   She says this chest discomfort feels exactly the same.     She had some old colchicine that she has been taking since Friday and it helped a little bit.  She said it knocks down the discomfort and she is had a consistent 5-6 out of 10 in intensity discomfort.  Been sharp.  She feels a little bit better in a recliner.  She is having some mild shortness of breath.  She is feeling a couple of skipping heartbeats.  She has not had any fevers or chills.  Labs were done at her primary provider's office.  An EKG was done and I was able to see a photograph of this.  There is no ST elevation.  Labs are pending including inflammatory markers.  She is otherwise been going to the gym and doing really well.  She is lost 85 pounds after weight loss surgery.  She has been biking and doing some weights and not having any chest discomfort with this.  She has not been having any PND or orthopnea.    ROS: ***  Studies Reviewed:    EKG:       ***  Risk Assessment/Calculations:   {Does this patient have ATRIAL FIBRILLATION?:[id number]} No BP recorded.  {Refresh Note OR Click here to enter BP  :1}***        Physical Exam:   VS:  There were no vitals taken for this visit.   Wt Readings from Last 3 Encounters:  12/30/22 200 lb 3.2 oz (90.8 kg)  10/17/22 209 lb 9.6 oz (95.1 kg)  06/12/22 216 lb (98 kg)     GEN: Well nourished, well developed in no acute distress NECK: No JVD; No  carotid bruits CARDIAC: ***RRR, no murmurs, rubs, gallops RESPIRATORY:  Clear to auscultation without rales, wheezing or rhonchi  ABDOMEN: Soft, non-tender, non-distended EXTREMITIES:  No edema; No deformity   ASSESSMENT AND PLAN:   CHEST PAIN:   Given the patient's chest pain is consistent with pericarditis.  I am going to check an echocardiogram.  I will get the results of her inflammatory markers.  I will rewrite a new prescription for the colchicine and will do this for at least 3 months.  There is a caution of using nonsteroidals on her given her gastric surgery but I think we have to use this at least for a small time and I will do a slower dose at 600 mg twice daily.  She will call me and let me know how this is going.  I like to do this probably for only 10 days if I can.    HTN: Her blood pressure is well-controlled.  No change in therapy.   CHRONIC DIASTOLIC DYSFUNCTION:   She has had no new shortness of breath.  She seems to be euvolemic.  No change in therapy.        {Are you ordering a CV  Procedure (e.g. stress test, cath, DCCV, TEE, etc)?   Press F2        :[id number]}  Follow up ***  Signed, Rollene Rotunda, MD

## 2023-01-29 ENCOUNTER — Other Ambulatory Visit (HOSPITAL_COMMUNITY): Payer: Self-pay

## 2023-01-29 ENCOUNTER — Ambulatory Visit: Payer: Commercial Managed Care - PPO | Admitting: Cardiology

## 2023-01-29 DIAGNOSIS — R072 Precordial pain: Secondary | ICD-10-CM

## 2023-01-29 DIAGNOSIS — I5032 Chronic diastolic (congestive) heart failure: Secondary | ICD-10-CM

## 2023-01-29 DIAGNOSIS — I10 Essential (primary) hypertension: Secondary | ICD-10-CM

## 2023-02-13 NOTE — Progress Notes (Unsigned)
  Cardiology Office Note:   Date:  02/16/2023  ID:  Chauncey Reading, DOB 1970/12/12, MRN [id number] PCP: Shon Hale, MD  [facility] Providers Cardiologist:  Rollene Rotunda, MD {  History of Present Illness:   Alicia Wood is a 52 y.o. female who was referred by Shon Hale, MD for evaluation of chest pain.   She has had a history pericarditis in 2017.   She has had recurrent symptoms.    She came in and had recurrent symptoms earlier this year.  She saw Dr. Tresa Endo.  She had an echo with  EF 55%% and no pericardial effusion.   Inflammatory markers were negative.  However, her symptoms were exactly consistent with her pericarditis that was clearly documented in 2017.  She has had recurrence of the symptoms about 4-5 times over the years.  She was eventually treated with corticosteroids.  She had resolution of her symptoms.    During all of this she works two jobs.  She works and [employer] and [employer].  She has a physical job.  She has lost about 150 lbs after weight loss surgery.  The patient denies any new symptoms such as neck or arm discomfort. There has been no new shortness of breath, PND or orthopnea. There have been no reported palpitations, presyncope or syncope.   ROS: As stated in the HPI and negative for all other systems.  Studies Reviewed:    EKG:   NA  Risk Assessment/Calculations:      Physical Exam:   VS:  BP 120/82 (BP Location: Right Arm, Patient Position: Sitting, Cuff Size: Normal)   Pulse (!) 58   Ht 5\' 8"  (1.727 m)   Wt 198 lb (89.8 kg)   SpO2 99%   BMI 30.11 kg/m    Wt Readings from Last 3 Encounters:  02/16/23 198 lb (89.8 kg)  12/30/22 200 lb 3.2 oz (90.8 kg)  10/17/22 209 lb 9.6 oz (95.1 kg)     GEN: Well nourished, well developed in no acute distress NECK: No JVD; No carotid bruits CARDIAC: RRR, no murmurs, rubs, gallops RESPIRATORY:  Clear to auscultation without rales, wheezing or rhonchi  ABDOMEN: Soft, non-tender,  non-distended EXTREMITIES:  No edema; No deformity   ASSESSMENT AND PLAN:   CHEST PAIN:   If she has recurrent symptoms consistent with pericarditis I am going to assume that this is recurrent pericarditis and would suggest IL-1 with rilonacept    HTN: Her blood pressure is at target.  No change in therapy.    CHRONIC DIASTOLIC DYSFUNCTION:   With her weight loss she has had significant improvement in her breathing.  I am not suspecting any constrictive pericarditis.  No change in therapy.       Follow up with me in one year unless she has recurrent symptoms.   Signed, Rollene Rotunda, MD

## 2023-02-16 ENCOUNTER — Ambulatory Visit: Payer: Commercial Managed Care - PPO | Attending: Cardiology | Admitting: Cardiology

## 2023-02-16 ENCOUNTER — Encounter: Payer: Self-pay | Admitting: Cardiology

## 2023-02-16 VITALS — BP 120/82 | HR 58 | Ht 68.0 in | Wt 198.0 lb

## 2023-02-16 DIAGNOSIS — R072 Precordial pain: Secondary | ICD-10-CM | POA: Diagnosis not present

## 2023-02-16 DIAGNOSIS — I10 Essential (primary) hypertension: Secondary | ICD-10-CM

## 2023-02-16 DIAGNOSIS — I5032 Chronic diastolic (congestive) heart failure: Secondary | ICD-10-CM | POA: Diagnosis not present

## 2023-02-16 NOTE — Patient Instructions (Signed)
Follow-Up: At [facility], you and your health needs are our priority.  As part of our continuing mission to provide you with exceptional heart care, we have created designated Provider Care Teams.  These Care Teams include your primary Cardiologist (physician) and Advanced Practice Providers (APPs -  Physician Assistants and Nurse Practitioners) who all work together to provide you with the care you need, when you need it.  to [email or web address].    Your next appointment:   12 month(s)  Provider:   Rollene Rotunda, MD

## 2023-03-03 ENCOUNTER — Other Ambulatory Visit (HOSPITAL_COMMUNITY): Payer: Self-pay

## 2023-03-27 ENCOUNTER — Other Ambulatory Visit (HOSPITAL_COMMUNITY): Payer: Self-pay

## 2023-03-27 ENCOUNTER — Other Ambulatory Visit: Payer: Self-pay

## 2023-03-30 ENCOUNTER — Other Ambulatory Visit (HOSPITAL_BASED_OUTPATIENT_CLINIC_OR_DEPARTMENT_OTHER): Payer: Self-pay

## 2023-03-30 MED ORDER — BUPROPION HCL ER (XL) 150 MG PO TB24
150.0000 mg | ORAL_TABLET | Freq: Every morning | ORAL | 3 refills | Status: DC
  Filled 2023-05-15: qty 90, 90d supply, fill #0
  Filled 2023-08-14: qty 90, 90d supply, fill #1

## 2023-03-31 ENCOUNTER — Other Ambulatory Visit (HOSPITAL_COMMUNITY): Payer: Self-pay

## 2023-04-14 ENCOUNTER — Other Ambulatory Visit (HOSPITAL_COMMUNITY): Payer: Self-pay

## 2023-04-17 ENCOUNTER — Ambulatory Visit: Payer: Commercial Managed Care - PPO | Admitting: Cardiology

## 2023-05-01 ENCOUNTER — Encounter (HOSPITAL_COMMUNITY): Payer: Self-pay | Admitting: *Deleted

## 2023-05-15 ENCOUNTER — Other Ambulatory Visit (HOSPITAL_COMMUNITY): Payer: Self-pay

## 2023-05-15 ENCOUNTER — Other Ambulatory Visit: Payer: Self-pay

## 2023-06-02 ENCOUNTER — Other Ambulatory Visit: Payer: Self-pay | Admitting: Family Medicine

## 2023-06-02 DIAGNOSIS — Z0000 Encounter for general adult medical examination without abnormal findings: Secondary | ICD-10-CM

## 2023-06-12 ENCOUNTER — Other Ambulatory Visit (HOSPITAL_COMMUNITY): Payer: Self-pay

## 2023-06-12 DIAGNOSIS — K912 Postsurgical malabsorption, not elsewhere classified: Secondary | ICD-10-CM | POA: Diagnosis not present

## 2023-06-12 DIAGNOSIS — Z1321 Encounter for screening for nutritional disorder: Secondary | ICD-10-CM | POA: Diagnosis not present

## 2023-06-12 DIAGNOSIS — E569 Vitamin deficiency, unspecified: Secondary | ICD-10-CM | POA: Diagnosis not present

## 2023-06-12 DIAGNOSIS — Z903 Acquired absence of stomach [part of]: Secondary | ICD-10-CM | POA: Diagnosis not present

## 2023-06-12 MED ORDER — PANTOPRAZOLE SODIUM 40 MG PO TBEC
DELAYED_RELEASE_TABLET | ORAL | 11 refills | Status: DC
  Filled 2023-06-12: qty 30, 30d supply, fill #0
  Filled 2023-07-13: qty 30, 30d supply, fill #1
  Filled 2023-08-14: qty 30, 30d supply, fill #2
  Filled 2023-08-27 – 2023-09-07 (×2): qty 30, 30d supply, fill #3

## 2023-06-22 DIAGNOSIS — E7800 Pure hypercholesterolemia, unspecified: Secondary | ICD-10-CM | POA: Diagnosis not present

## 2023-06-22 DIAGNOSIS — E1165 Type 2 diabetes mellitus with hyperglycemia: Secondary | ICD-10-CM | POA: Diagnosis not present

## 2023-06-22 DIAGNOSIS — Z9884 Bariatric surgery status: Secondary | ICD-10-CM | POA: Diagnosis not present

## 2023-06-23 ENCOUNTER — Ambulatory Visit
Admission: RE | Admit: 2023-06-23 | Discharge: 2023-06-23 | Disposition: A | Discharge status: Home / Self Care | Source: Ambulatory Visit | Attending: Family Medicine

## 2023-06-23 DIAGNOSIS — E7800 Pure hypercholesterolemia, unspecified: Secondary | ICD-10-CM | POA: Diagnosis not present

## 2023-06-23 DIAGNOSIS — Z0000 Encounter for general adult medical examination without abnormal findings: Secondary | ICD-10-CM

## 2023-06-23 DIAGNOSIS — Z9884 Bariatric surgery status: Secondary | ICD-10-CM | POA: Diagnosis not present

## 2023-06-23 DIAGNOSIS — E1165 Type 2 diabetes mellitus with hyperglycemia: Secondary | ICD-10-CM | POA: Diagnosis not present

## 2023-06-23 DIAGNOSIS — Z1231 Encounter for screening mammogram for malignant neoplasm of breast: Secondary | ICD-10-CM | POA: Diagnosis not present

## 2023-08-27 ENCOUNTER — Other Ambulatory Visit (HOSPITAL_COMMUNITY): Payer: Self-pay

## 2023-08-27 ENCOUNTER — Other Ambulatory Visit: Payer: Self-pay | Admitting: Cardiology

## 2023-08-27 DIAGNOSIS — I10 Essential (primary) hypertension: Secondary | ICD-10-CM

## 2023-08-27 DIAGNOSIS — I300 Acute nonspecific idiopathic pericarditis: Secondary | ICD-10-CM

## 2023-08-27 DIAGNOSIS — I5032 Chronic diastolic (congestive) heart failure: Secondary | ICD-10-CM

## 2023-08-27 MED ORDER — LINZESS 145 MCG PO CAPS
145.0000 ug | ORAL_CAPSULE | Freq: Every day | ORAL | 2 refills | Status: AC
  Filled 2023-08-27: qty 90, 90d supply, fill #0
  Filled 2023-12-06 – 2024-01-02 (×2): qty 90, 90d supply, fill #1
  Filled 2024-04-01: qty 90, 90d supply, fill #2

## 2023-08-31 ENCOUNTER — Other Ambulatory Visit: Payer: Self-pay

## 2023-08-31 ENCOUNTER — Other Ambulatory Visit (HOSPITAL_COMMUNITY): Payer: Self-pay

## 2023-08-31 DIAGNOSIS — I5032 Chronic diastolic (congestive) heart failure: Secondary | ICD-10-CM

## 2023-08-31 DIAGNOSIS — I10 Essential (primary) hypertension: Secondary | ICD-10-CM

## 2023-08-31 DIAGNOSIS — I300 Acute nonspecific idiopathic pericarditis: Secondary | ICD-10-CM

## 2023-08-31 MED ORDER — ROSUVASTATIN CALCIUM 20 MG PO TABS
20.0000 mg | ORAL_TABLET | Freq: Every day | ORAL | 1 refills | Status: AC
  Filled 2023-08-31: qty 90, 90d supply, fill #0
  Filled 2023-12-06 – 2024-01-02 (×2): qty 90, 90d supply, fill #1

## 2023-09-07 ENCOUNTER — Other Ambulatory Visit: Payer: Self-pay

## 2023-09-07 ENCOUNTER — Other Ambulatory Visit (HOSPITAL_COMMUNITY): Payer: Self-pay

## 2023-09-07 DIAGNOSIS — F3341 Major depressive disorder, recurrent, in partial remission: Secondary | ICD-10-CM | POA: Diagnosis not present

## 2023-09-07 DIAGNOSIS — E785 Hyperlipidemia, unspecified: Secondary | ICD-10-CM | POA: Diagnosis not present

## 2023-09-07 DIAGNOSIS — E559 Vitamin D deficiency, unspecified: Secondary | ICD-10-CM | POA: Diagnosis not present

## 2023-09-07 DIAGNOSIS — Z0000 Encounter for general adult medical examination without abnormal findings: Secondary | ICD-10-CM | POA: Diagnosis not present

## 2023-09-07 DIAGNOSIS — E1165 Type 2 diabetes mellitus with hyperglycemia: Secondary | ICD-10-CM | POA: Diagnosis not present

## 2023-09-07 DIAGNOSIS — M79672 Pain in left foot: Secondary | ICD-10-CM | POA: Diagnosis not present

## 2023-09-07 MED ORDER — BUPROPION HCL ER (XL) 300 MG PO TB24
300.0000 mg | ORAL_TABLET | Freq: Every morning | ORAL | 3 refills | Status: AC
  Filled 2023-09-07: qty 90, 90d supply, fill #0
  Filled 2023-12-06 – 2024-01-02 (×2): qty 90, 90d supply, fill #1
  Filled 2024-04-01: qty 90, 90d supply, fill #2

## 2023-09-07 MED ORDER — ROSUVASTATIN CALCIUM 10 MG PO TABS
10.0000 mg | ORAL_TABLET | Freq: Every day | ORAL | 3 refills | Status: DC
  Filled 2023-09-07: qty 90, 90d supply, fill #0

## 2023-09-15 ENCOUNTER — Ambulatory Visit: Admitting: Podiatry

## 2023-09-15 DIAGNOSIS — E785 Hyperlipidemia, unspecified: Secondary | ICD-10-CM | POA: Insufficient documentation

## 2023-09-15 DIAGNOSIS — M722 Plantar fascial fibromatosis: Secondary | ICD-10-CM

## 2023-09-15 DIAGNOSIS — M1990 Unspecified osteoarthritis, unspecified site: Secondary | ICD-10-CM | POA: Insufficient documentation

## 2023-09-15 DIAGNOSIS — K5901 Slow transit constipation: Secondary | ICD-10-CM | POA: Insufficient documentation

## 2023-09-15 DIAGNOSIS — F3341 Major depressive disorder, recurrent, in partial remission: Secondary | ICD-10-CM | POA: Insufficient documentation

## 2023-09-15 DIAGNOSIS — D2372 Other benign neoplasm of skin of left lower limb, including hip: Secondary | ICD-10-CM | POA: Diagnosis not present

## 2023-09-15 DIAGNOSIS — E1165 Type 2 diabetes mellitus with hyperglycemia: Secondary | ICD-10-CM | POA: Insufficient documentation

## 2023-09-15 DIAGNOSIS — M170 Bilateral primary osteoarthritis of knee: Secondary | ICD-10-CM | POA: Insufficient documentation

## 2023-09-15 DIAGNOSIS — M1120 Other chondrocalcinosis, unspecified site: Secondary | ICD-10-CM | POA: Insufficient documentation

## 2023-09-15 DIAGNOSIS — Z8679 Personal history of other diseases of the circulatory system: Secondary | ICD-10-CM | POA: Insufficient documentation

## 2023-09-15 NOTE — Progress Notes (Signed)
 Subjective:  Patient ID: Alicia Wood, female    DOB: [birth year],  MRN: [id number] HPI Chief Complaint  Patient presents with   Foot Pain    Plantar foot left - small, callused lesion x 1 months, very sore with pressure, hurts to walk, has another area starting 5th met base, tried OTC pain meds   New Patient (Initial Visit)    53 y.o. female presents with the above complaint.   ROS: Denies fever chills nausea vomiting muscle aches pains calf pain back pain chest pain shortness of breath.  Past Medical History:  Diagnosis Date   Chronic diastolic CHF (congestive heart failure) (HCC)    a. 11/2015 Echo: EF 55-60%, grade 2 diastolic dysfxn.   Depression    GERD (gastroesophageal reflux disease)    Hyperlipidemia    Hypertension    Patient states she does not have hypertension.    Osteoarthritis    Pericarditis    a. 11/2015 Cardiac MRI; EF 57%, mild MR, triv TR, mild pericardial effusion, thickening of pericardium focally up to 8mm w/ significant circumferential late gad enhancement of pericardium and epicardium sugg of subacute effusive constrictive pericarditis;  b. 12/2015 R Heart Cath: nl right heart pressures w/o evidence of constrictive physiology.   Torn meniscus    Past Surgical History:  Procedure Laterality Date   ABDOMINAL HYSTERECTOMY     age 43   APPENDECTOMY     as a teen   CARDIAC CATHETERIZATION N/A 01/21/2016   Procedure: Right/Left Heart Cath and Coronary Angiography;  Surgeon: Lonni JONETTA Cash, MD;  Location: [facility] INVASIVE CV LAB;  Service: Cardiovascular;  Laterality: N/A;   ESOPHAGOGASTRODUODENOSCOPY (EGD) WITH PROPOFOL  N/A 01/30/2016   Procedure: ESOPHAGOGASTRODUODENOSCOPY (EGD) WITH PROPOFOL ;  Surgeon: Jerrell Sol, MD;  Location: [facility] ENDOSCOPY;  Service: Endoscopy;  Laterality: N/A;   LAPAROSCOPIC GASTRIC SLEEVE RESECTION N/A 10/01/2021   Procedure: LAPAROSCOPIC SLEEVE GASTRECTOMY;  Surgeon: Signe Mitzie LABOR, MD;  Location: [facility] ORS;  Service:  General;  Laterality: N/A;   TUBAL LIGATION  1992   UPPER GI ENDOSCOPY N/A 10/01/2021   Procedure: UPPER GI ENDOSCOPY;  Surgeon: Signe Mitzie LABOR, MD;  Location: [facility] ORS;  Service: General;  Laterality: N/A;    Current Outpatient Medications:    buPROPion  (WELLBUTRIN  XL) 300 MG 24 hr tablet, Take 1 tablet (300 mg total) by mouth in the morning., Disp: 90 tablet, Rfl: 3   linaclotide  (LINZESS ) 145 MCG CAPS capsule, Take 1 capsule (145 mcg total) by mouth daily 30 minutes before the first meal of the day on an empty stomach, Disp: 90 capsule, Rfl: 2   Nutritional Supplements (VITAMIN D  PLUS COFACTORS) TABS, , Disp: , Rfl:    pantoprazole  (PROTONIX ) 40 MG tablet, Take 1 tablet (40 mg total) by mouth daily. Take this medication daily, regardless of reflux symptoms, Disp: 90 tablet, Rfl: 0   rosuvastatin  (CRESTOR ) 20 MG tablet, Take 1 tablet (20 mg total) by mouth daily., Disp: 90 tablet, Rfl: 1  Allergies  Allergen Reactions   Latex Hives    Powdered    Review of Systems Objective:  There were no vitals filed for this visit.  General: Well developed, nourished, in no acute distress, alert and oriented x3   Dermatological: Skin is warm, dry and supple bilateral. Nails x 10 are well maintained; remaining integument appears unremarkable at this time. There are no open sores, no preulcerative lesions, no rash or signs of infection present.  Painful benign skin lesion plantar aspect fifth metatarsal base of the left  foot.  Deep nucleated core present.  Vascular: Dorsalis Pedis artery and Posterior Tibial artery pedal pulses are 2/4 bilateral with immedate capillary fill time. Pedal hair growth present. No varicosities and no lower extremity edema present bilateral.   Neruologic: Grossly intact via light touch bilateral. Vibratory intact via tuning fork bilateral. Protective threshold with Semmes Wienstein monofilament intact to all pedal sites bilateral. Patellar and Achilles deep tendon reflexes  2+ bilateral. No Babinski or clonus noted bilateral.   Musculoskeletal: No gross boney pedal deformities bilateral. No pain, crepitus, or limitation noted with foot and ankle range of motion bilateral. Muscular strength 5/5 in all groups tested bilateral.  Gait: Unassisted, Nonantalgic.    Radiographs:  None taken  Assessment & Plan:   Assessment: Benign skin lesion plantar aspect subfifth met base left foot.  Plan: Discussed etiology pathology conservative surgical therapies at this point I mechanically debrided this placed Salinocaine under occlusion to be left on for 3 days without getting wet then washed off thoroughly.  I like to follow-up with her in 3 weeks so I can better debride this.     Dacian Orrico T. McClure, [place]

## 2023-10-13 ENCOUNTER — Ambulatory Visit: Admitting: Podiatry

## 2023-12-16 ENCOUNTER — Other Ambulatory Visit (HOSPITAL_COMMUNITY): Payer: Self-pay

## 2024-01-02 ENCOUNTER — Other Ambulatory Visit (HOSPITAL_COMMUNITY): Payer: Self-pay

## 2024-02-12 ENCOUNTER — Other Ambulatory Visit (HOSPITAL_COMMUNITY): Payer: Self-pay

## 2024-02-12 DIAGNOSIS — K219 Gastro-esophageal reflux disease without esophagitis: Secondary | ICD-10-CM | POA: Diagnosis not present

## 2024-02-12 DIAGNOSIS — I5032 Chronic diastolic (congestive) heart failure: Secondary | ICD-10-CM | POA: Diagnosis not present

## 2024-02-12 DIAGNOSIS — I10 Essential (primary) hypertension: Secondary | ICD-10-CM | POA: Diagnosis not present

## 2024-02-12 DIAGNOSIS — Z86018 Personal history of other benign neoplasm: Secondary | ICD-10-CM | POA: Diagnosis not present

## 2024-02-12 DIAGNOSIS — D649 Anemia, unspecified: Secondary | ICD-10-CM | POA: Diagnosis not present

## 2024-02-12 MED ORDER — PANTOPRAZOLE SODIUM 40 MG PO TBEC
40.0000 mg | DELAYED_RELEASE_TABLET | Freq: Every day | ORAL | 3 refills | Status: AC
  Filled 2024-02-12: qty 90, 90d supply, fill #0

## 2024-02-17 ENCOUNTER — Telehealth (HOSPITAL_BASED_OUTPATIENT_CLINIC_OR_DEPARTMENT_OTHER): Payer: Self-pay | Admitting: *Deleted

## 2024-02-17 NOTE — Telephone Encounter (Signed)
   Name: Alicia Wood  DOB: 10-10-1970  MRN: [id number]  Primary Cardiologist: Lynwood Schilling, MD  Chart reviewed as part of pre-operative protocol coverage. The patient has an upcoming visit scheduled with Dr. Schilling on 02/26/24 at which time clearance can be addressed in case there are any issues that would impact surgical recommendations.  Colonoscopy and upper endoscopy is not scheduled as below. I added preop FYI to appointment note so that provider is aware to address at time of outpatient visit.  Per office protocol the cardiology provider should forward their finalized clearance decision and recommendations regarding antiplatelet therapy to the requesting party below.    I will route this message as FYI to requesting party and remove this message from the preop box as separate preop APP input not needed at this time.   Please call with any questions.  Thurmon Mizell D Tyrone Pautsch, NP  02/17/2024, 8:42 AM

## 2024-02-17 NOTE — Telephone Encounter (Signed)
   Pre-operative Risk Assessment    Patient Name: Alicia Wood  DOB: 1970/04/07 MRN: [id number]   Date of last office visit: 02/16/23 DR. HOCHRIEN Date of next office visit: 02/26/24 DR. HOCHRIEN YRLY F/U   Request for Surgical Clearance    Procedure:  COLONOSCOPY AND UPPER ENDOSCOPY  Date of Surgery:  Clearance TBD                                Surgeon: NOT LISTED Surgeon's Group or Practice Name:  [facility] Phone number:  [phone] Fax number:  [phone]   Type of Clearance Requested:   - Medical    Type of Anesthesia:  Not Indicated (PROPOFOL ?)   Additional requests/questions:    Bonney Niels Jest   02/17/2024, 8:16 AM

## 2024-02-25 NOTE — Progress Notes (Signed)
 Cardiology Office Note:   Date:  02/26/2024  ID:  Alicia Wood, DOB [birth year], MRN [id number] PCP: Chrystal Lamarr RAMAN, MD  [facility] Providers Cardiologist:  Alicia Schilling, MD {  History of Present Illness:   Alicia Wood is a 53 y.o. female who was referred by Chrystal Lamarr RAMAN, MD for evaluation of chest pain.   She has had a history pericarditis in 2017.   She has had recurrent symptoms.    She came in and had recurrent symptoms earlier this year.  She saw Dr. Burnard.  She had an echo with  EF 55%% and no pericardial effusion.   Inflammatory markers were negative.  However, her symptoms were exactly consistent with her pericarditis that was clearly documented in 2017.  She has had recurrence of the symptoms about 4-5 times over the years.  She was eventually treated with corticosteroids.    She returns for follow up.  She needs preoperative clearance prior to getting colonoscopy.  She has no new cardiovascular complaints.  She works 2 jobs both at [employer] and [employer].The patient denies any new symptoms such as chest discomfort, neck or arm discomfort. There has been no new shortness of breath, PND or orthopnea. There have been no reported palpitations, presyncope or syncope.   ROS: As stated in the HPI and negative for all other systems.  Studies Reviewed:    EKG:   EKG Interpretation Date/Time:  Friday February 26 2024 10:27:59 EST Ventricular Rate:  64 PR Interval:  154 QRS Duration:  78 QT Interval:  436 QTC Calculation: 449 R Axis:   60  Text Interpretation: Sinus rhythm with Sinus arrhythmia Nonspecific T wave abnormality no longer evident in Anterolateral leads Confirmed by Wood Alicia ([id number]) on 02/26/2024 10:55:40 AM     Risk Assessment/Calculations:              Physical Exam:   VS:  BP 118/80 (BP Location: Left Arm, Patient Position: Sitting, Cuff Size: Large)   Pulse 64   Ht 5' 8 (1.727 m)   Wt 199 lb (90.3 kg)    SpO2 98%   BMI 30.26 kg/m    Wt Readings from Last 3 Encounters:  02/26/24 199 lb (90.3 kg)  02/16/23 198 lb (89.8 kg)  12/30/22 200 lb 3.2 oz (90.8 kg)     GEN: Well nourished, well developed in no acute distress NECK: No JVD; No carotid bruits CARDIAC: RRR, no murmurs, rubs, gallops RESPIRATORY:  Clear to auscultation without rales, wheezing or rhonchi  ABDOMEN: Soft, non-tender, non-distended EXTREMITIES:  No edema; No deformity   ASSESSMENT AND PLAN:   PERICARDITIS: She has had no recurrent symptoms.  No change in therapy.  She can come back if she has symptoms in the future.   HTN: Her blood pressure is at target.  No change in therapy.  DYSLIPIDEMIA: She did have mildly elevated LDL in the past and is on statin.  She would like to come off of this.  I looked through records and imaging and do not see that she has ever had a mention of coronary calcium .  She has an excellent HDL.  Total cholesterol earlier this year was 184.  I will defer to her primary provider but would support her request to come off of statin pending her repeat lipid.   CHRONIC DIASTOLIC DYSFUNCTION: She is euvolemic.  No change in therapy.   PREOP: The patient is at acceptable risk for the planned colonoscopy.  No further testing  is indicated.     Follow up with me as needed.  Signed, Alicia Schilling, MD

## 2024-02-26 ENCOUNTER — Ambulatory Visit: Attending: Cardiology | Admitting: Cardiology

## 2024-02-26 ENCOUNTER — Encounter: Payer: Self-pay | Admitting: Cardiology

## 2024-02-26 VITALS — BP 118/80 | HR 64 | Ht 68.0 in | Wt 199.0 lb

## 2024-02-26 DIAGNOSIS — I300 Acute nonspecific idiopathic pericarditis: Secondary | ICD-10-CM

## 2024-02-26 NOTE — Patient Instructions (Signed)

## 2024-03-10 ENCOUNTER — Other Ambulatory Visit (HOSPITAL_COMMUNITY): Payer: Self-pay

## 2024-03-11 ENCOUNTER — Other Ambulatory Visit (HOSPITAL_COMMUNITY): Payer: Self-pay

## 2024-03-11 MED ORDER — BISACODYL 5 MG PO TBEC
DELAYED_RELEASE_TABLET | ORAL | 0 refills | Status: AC
  Filled 2024-03-11: qty 4, 1d supply, fill #0

## 2024-03-11 MED ORDER — PEG 3350-KCL-NA BICARB-NACL 420 G PO SOLR
ORAL | 0 refills | Status: AC
  Filled 2024-03-11: qty 4000, 1d supply, fill #0

## 2024-03-14 DIAGNOSIS — Z860101 Personal history of adenomatous and serrated colon polyps: Secondary | ICD-10-CM | POA: Diagnosis not present

## 2024-03-14 DIAGNOSIS — Z09 Encounter for follow-up examination after completed treatment for conditions other than malignant neoplasm: Secondary | ICD-10-CM | POA: Diagnosis not present

## 2024-03-14 DIAGNOSIS — K648 Other hemorrhoids: Secondary | ICD-10-CM | POA: Diagnosis not present

## 2024-03-31 ENCOUNTER — Other Ambulatory Visit (HOSPITAL_COMMUNITY): Payer: Self-pay

## 2024-04-01 ENCOUNTER — Other Ambulatory Visit: Payer: Self-pay

## 2024-04-08 ENCOUNTER — Other Ambulatory Visit (HOSPITAL_COMMUNITY): Payer: Self-pay

## 2024-04-11 ENCOUNTER — Other Ambulatory Visit (HOSPITAL_COMMUNITY): Payer: Self-pay

## 2024-04-17 ENCOUNTER — Other Ambulatory Visit (HOSPITAL_COMMUNITY): Payer: Self-pay
